# Patient Record
Sex: Female | Born: 1935
Health system: Southern US, Community
[De-identification: ages and names within clinical notes are randomized; demographics above are authoritative.]

## PROBLEM LIST (undated history)

## (undated) DIAGNOSIS — R233 Spontaneous ecchymoses: Secondary | ICD-10-CM

## (undated) DIAGNOSIS — K5790 Diverticulosis of intestine, part unspecified, without perforation or abscess without bleeding: Secondary | ICD-10-CM

## (undated) DIAGNOSIS — Z9889 Other specified postprocedural states: Secondary | ICD-10-CM

## (undated) DIAGNOSIS — K219 Gastro-esophageal reflux disease without esophagitis: Secondary | ICD-10-CM

## (undated) DIAGNOSIS — R634 Abnormal weight loss: Secondary | ICD-10-CM

## (undated) DIAGNOSIS — I219 Acute myocardial infarction, unspecified: Secondary | ICD-10-CM

## (undated) DIAGNOSIS — H353190 Nonexudative age-related macular degeneration, unspecified eye, stage unspecified: Secondary | ICD-10-CM

## (undated) DIAGNOSIS — C3490 Malignant neoplasm of unspecified part of unspecified bronchus or lung: Secondary | ICD-10-CM

## (undated) DIAGNOSIS — N393 Stress incontinence (female) (male): Secondary | ICD-10-CM

## (undated) DIAGNOSIS — M87052 Idiopathic aseptic necrosis of left femur: Secondary | ICD-10-CM

## (undated) DIAGNOSIS — S61419A Laceration without foreign body of unspecified hand, initial encounter: Secondary | ICD-10-CM

## (undated) DIAGNOSIS — I2510 Atherosclerotic heart disease of native coronary artery without angina pectoris: Secondary | ICD-10-CM

## (undated) DIAGNOSIS — N200 Calculus of kidney: Secondary | ICD-10-CM

## (undated) DIAGNOSIS — I10 Essential (primary) hypertension: Secondary | ICD-10-CM

## (undated) DIAGNOSIS — Z923 Personal history of irradiation: Secondary | ICD-10-CM

## (undated) DIAGNOSIS — M1990 Unspecified osteoarthritis, unspecified site: Secondary | ICD-10-CM

## (undated) DIAGNOSIS — Q828 Other specified congenital malformations of skin: Secondary | ICD-10-CM

## (undated) DIAGNOSIS — J189 Pneumonia, unspecified organism: Secondary | ICD-10-CM

## (undated) DIAGNOSIS — Z8619 Personal history of other infectious and parasitic diseases: Secondary | ICD-10-CM

## (undated) DIAGNOSIS — R0602 Shortness of breath: Secondary | ICD-10-CM

## (undated) DIAGNOSIS — I639 Cerebral infarction, unspecified: Secondary | ICD-10-CM

## (undated) DIAGNOSIS — E039 Hypothyroidism, unspecified: Secondary | ICD-10-CM

## (undated) DIAGNOSIS — R42 Dizziness and giddiness: Secondary | ICD-10-CM

## (undated) DIAGNOSIS — R112 Nausea with vomiting, unspecified: Secondary | ICD-10-CM

## (undated) DIAGNOSIS — R918 Other nonspecific abnormal finding of lung field: Secondary | ICD-10-CM

## (undated) DIAGNOSIS — J449 Chronic obstructive pulmonary disease, unspecified: Secondary | ICD-10-CM

## (undated) DIAGNOSIS — Z9181 History of falling: Secondary | ICD-10-CM

## (undated) DIAGNOSIS — R0789 Other chest pain: Secondary | ICD-10-CM

## (undated) DIAGNOSIS — Z87442 Personal history of urinary calculi: Secondary | ICD-10-CM

## (undated) DIAGNOSIS — R238 Other skin changes: Secondary | ICD-10-CM

## (undated) DIAGNOSIS — I951 Orthostatic hypotension: Secondary | ICD-10-CM

## (undated) DIAGNOSIS — R51 Headache: Secondary | ICD-10-CM

## (undated) DIAGNOSIS — R350 Frequency of micturition: Secondary | ICD-10-CM

## (undated) DIAGNOSIS — K529 Noninfective gastroenteritis and colitis, unspecified: Secondary | ICD-10-CM

## (undated) DIAGNOSIS — F32A Depression, unspecified: Secondary | ICD-10-CM

## (undated) DIAGNOSIS — F329 Major depressive disorder, single episode, unspecified: Secondary | ICD-10-CM

## (undated) DIAGNOSIS — E7800 Pure hypercholesterolemia, unspecified: Secondary | ICD-10-CM

## (undated) HISTORY — PX: APPENDECTOMY: SHX54

## (undated) HISTORY — PX: ABDOMINAL HYSTERECTOMY: SHX81

## (undated) HISTORY — PX: KNEE ARTHROSCOPY: SUR90

## (undated) HISTORY — PX: EYE SURGERY: SHX253

## (undated) HISTORY — PX: ESOPHAGOGASTRODUODENOSCOPY: SHX1529

## (undated) HISTORY — PX: TOTAL KNEE ARTHROPLASTY: SHX125

## (undated) HISTORY — PX: COLONOSCOPY: SHX174

## (undated) HISTORY — PX: HIP ARTHROSCOPY: SUR88

## (undated) HISTORY — PX: JOINT REPLACEMENT: SHX530

---

## 1999-07-02 HISTORY — PX: BLADDER SURGERY: SHX569

## 2008-07-01 HISTORY — PX: CORONARY ANGIOPLASTY WITH STENT PLACEMENT: SHX49

## 2008-07-01 HISTORY — PX: OTHER SURGICAL HISTORY: SHX169

## 2009-07-01 HISTORY — PX: LITHOTRIPSY: SUR834

## 2010-07-01 HISTORY — PX: OTHER SURGICAL HISTORY: SHX169

## 2011-07-02 HISTORY — PX: OTHER SURGICAL HISTORY: SHX169

## 2011-07-16 ENCOUNTER — Emergency Department (HOSPITAL_BASED_OUTPATIENT_CLINIC_OR_DEPARTMENT_OTHER)
Admission: EM | Admit: 2011-07-16 | Discharge: 2011-07-16 | Disposition: A | Discharge status: Home / Self Care | Payer: Medicare Other | Attending: Emergency Medicine | Admitting: Emergency Medicine

## 2011-07-16 ENCOUNTER — Other Ambulatory Visit (HOSPITAL_BASED_OUTPATIENT_CLINIC_OR_DEPARTMENT_OTHER): Payer: Self-pay | Admitting: Orthopedic Surgery

## 2011-07-16 ENCOUNTER — Encounter (HOSPITAL_BASED_OUTPATIENT_CLINIC_OR_DEPARTMENT_OTHER): Payer: Self-pay | Admitting: Emergency Medicine

## 2011-07-16 ENCOUNTER — Emergency Department (INDEPENDENT_AMBULATORY_CARE_PROVIDER_SITE_OTHER): Payer: Medicare Other

## 2011-07-16 DIAGNOSIS — I252 Old myocardial infarction: Secondary | ICD-10-CM | POA: Insufficient documentation

## 2011-07-16 DIAGNOSIS — Z79899 Other long term (current) drug therapy: Secondary | ICD-10-CM | POA: Insufficient documentation

## 2011-07-16 DIAGNOSIS — M25569 Pain in unspecified knee: Secondary | ICD-10-CM | POA: Insufficient documentation

## 2011-07-16 DIAGNOSIS — S8990XA Unspecified injury of unspecified lower leg, initial encounter: Secondary | ICD-10-CM

## 2011-07-16 DIAGNOSIS — E7800 Pure hypercholesterolemia, unspecified: Secondary | ICD-10-CM | POA: Insufficient documentation

## 2011-07-16 DIAGNOSIS — R609 Edema, unspecified: Secondary | ICD-10-CM

## 2011-07-16 DIAGNOSIS — M899 Disorder of bone, unspecified: Secondary | ICD-10-CM

## 2011-07-16 DIAGNOSIS — X58XXXA Exposure to other specified factors, initial encounter: Secondary | ICD-10-CM

## 2011-07-16 DIAGNOSIS — M25469 Effusion, unspecified knee: Secondary | ICD-10-CM

## 2011-07-16 DIAGNOSIS — I10 Essential (primary) hypertension: Secondary | ICD-10-CM | POA: Insufficient documentation

## 2011-07-16 DIAGNOSIS — I2510 Atherosclerotic heart disease of native coronary artery without angina pectoris: Secondary | ICD-10-CM | POA: Insufficient documentation

## 2011-07-16 DIAGNOSIS — M25561 Pain in right knee: Secondary | ICD-10-CM

## 2011-07-16 HISTORY — DX: Calculus of kidney: N20.0

## 2011-07-16 HISTORY — DX: Acute myocardial infarction, unspecified: I21.9

## 2011-07-16 HISTORY — DX: Noninfective gastroenteritis and colitis, unspecified: K52.9

## 2011-07-16 HISTORY — DX: Atherosclerotic heart disease of native coronary artery without angina pectoris: I25.10

## 2011-07-16 HISTORY — DX: Essential (primary) hypertension: I10

## 2011-07-16 HISTORY — DX: Pure hypercholesterolemia, unspecified: E78.00

## 2011-07-16 MED ORDER — DIAZEPAM 5 MG PO TABS: 5.0000 mg | ORAL_TABLET | Freq: Two times a day (BID) | ORAL | Status: AC

## 2011-07-16 MED ORDER — DIAZEPAM 5 MG PO TABS
5.0000 mg | ORAL_TABLET | Freq: Once | ORAL | Status: AC
  Administered 2011-07-16: 5 mg via ORAL
  Filled 2011-07-16: qty 1

## 2011-07-16 MED ORDER — IBUPROFEN 400 MG PO TABS
400.0000 mg | ORAL_TABLET | Freq: Once | ORAL | Status: AC
  Administered 2011-07-16: 400 mg via ORAL
  Filled 2011-07-16: qty 1

## 2011-07-16 NOTE — ED Provider Notes (Signed)
History     CSN: [id number]  Arrival date & time 07/16/11  0865   First MD Initiated Contact with Patient 07/16/11 [phone]      Chief Complaint  Patient presents with  . Knee Pain    (Consider location/radiation/quality/duration/timing/severity/associated sxs/prior treatment) HPI Comments: Patient presents with recurrent right knee pain.  Patient notes that for a period of months to years she has intermittently felt that right knee goes out.  This morning she got up and had problems with cramping in her left leg and then felt that her right knee went out on her when she tried to balance on that leg..  She did not fall.  She did not hit her knee.  Since that time she's had increased right knee pain.  No redness, fevers or swelling.  No ankle pain or hip pain.  She has been able to bear weight but is concerned about the knee.  She has not taken anything for the pain yet today.  The pain is worse with certain movements or trying to stand or walk.  sHe is able to ambulate.  Patient is a 78 y.o. female presenting with knee pain. The history is provided by the patient. No language interpreter was used.  Knee Pain This is a recurrent problem. The current episode started 6 to 12 hours ago. The problem has not changed since onset.Pertinent negatives include no chest pain, no abdominal pain, no headaches and no shortness of breath. The symptoms are aggravated by standing. The symptoms are relieved by nothing.    Past Medical History  Diagnosis Date  . Hypertension   . Colitis   . Coronary artery disease   . High cholesterol   . MI (myocardial infarction)   . Kidney stones     Past Surgical History  Procedure Date  . Coronary angioplasty with stent placement   . Hip arthroscopy     RT  . Knee arthroscopy     LT    History reviewed. No pertinent family history.  History  Substance Use Topics  . Smoking status: Not on file  . Smokeless tobacco: Not on file  . Alcohol Use:     OB  History    Grav Para Term Preterm Abortions TAB SAB Ect Mult Living                  Review of Systems  Constitutional: Negative.  Negative for fever and chills.  HENT: Negative.   Eyes: Negative.  Negative for discharge and redness.  Respiratory: Negative.  Negative for cough and shortness of breath.   Cardiovascular: Negative.  Negative for chest pain.  Gastrointestinal: Negative.  Negative for nausea, vomiting, abdominal pain and diarrhea.  Genitourinary: Negative.  Negative for dysuria and vaginal discharge.  Musculoskeletal: Positive for arthralgias. Negative for back pain.  Skin: Negative.  Negative for color change and rash.  Neurological: Negative.  Negative for syncope and headaches.  Hematological: Negative.  Negative for adenopathy.  Psychiatric/Behavioral: Negative.  Negative for confusion.  All other systems reviewed and are negative.    Allergies  Codeine  Home Medications   Current Outpatient Rx  Name Route Sig Dispense Refill  . AMITRIPTYLINE HCL 25 MG PO TABS Oral Take 25 mg by mouth at bedtime.    Marland Kitchen AMLODIPINE BESYLATE 5 MG PO TABS Oral Take 5 mg by mouth daily.    Marland Kitchen AMLODIPINE BESY-BENAZEPRIL HCL 2.5-10 MG PO CAPS Oral Take 1 capsule by mouth daily.    Marland Kitchen  ASPIRIN 325 MG PO TBEC Oral Take 325 mg by mouth daily.    Marland Kitchen CARVEDILOL 6.25 MG PO TABS Oral Take 6.25 mg by mouth 2 (two) times daily with a meal.    . VITAMIN B-12 IJ Injection Inject 1,000 mcg/mL as directed every 30 (thirty) days.    . CYCLOSPORINE 0.05 % OP EMUL Both Eyes Place 1 drop into both eyes daily.    . DONEPEZIL HCL 10 MG PO TABS Oral Take 10 mg by mouth at bedtime.    . OMEGA-3 FATTY ACIDS 1000 MG PO CAPS Oral Take 2 g by mouth daily.    Marland Kitchen FLUTICASONE-SALMETEROL 250-50 MCG/DOSE IN AEPB Inhalation Inhale 1 puff into the lungs daily.    Marland Kitchen LEVOTHYROXINE SODIUM 112 MCG PO TABS Oral Take 112 mcg by mouth daily.    Marland Kitchen MESALAMINE 1.2 G PO TBEC Oral Take 400 mg by mouth daily with breakfast.    .  NITROGLYCERIN 0.4 MG SL SUBL Sublingual Place 0.4 mg under the tongue every 5 (five) minutes as needed. For chest pain    . PANTOPRAZOLE SODIUM 40 MG PO TBEC Oral Take 40 mg by mouth daily.    Marland Kitchen RAMIPRIL 10 MG PO CAPS Oral Take 10 mg by mouth daily.    . RED YEAST RICE 600 MG PO CAPS Oral Take 2 capsules by mouth at bedtime.    Marland Kitchen POLYMYXIN B-TRIMETHOPRIM 10000-0.1 UNIT/ML-% OP SOLN Left Eye Place 1 drop into the left eye every 6 (six) hours.    Marland Kitchen VITAMIN D (ERGOCALCIFEROL) 50000 UNITS PO CAPS Oral Take 50,000 Units by mouth every 7 (seven) days.      BP 151/59  Pulse 66  Temp(Src) 97.5 F (36.4 C) (Oral)  Resp 16  Ht 5\' 11"  (1.803 m)  Wt 195 lb (88.451 kg)  BMI 27.20 kg/m2  SpO2 97%  Physical Exam  Constitutional: She is oriented to person, place, and time. She appears well-developed and well-nourished.  Non-toxic appearance. She does not have a sickly appearance.  HENT:  Head: Normocephalic and atraumatic.  Eyes: Conjunctivae, EOM and lids are normal. Pupils are equal, round, and reactive to light. No scleral icterus.  Neck: Trachea normal and normal range of motion. Neck supple.  Cardiovascular: Normal rate, regular rhythm and normal heart sounds.   Pulmonary/Chest: Effort normal and breath sounds normal.  Abdominal: Soft. Normal appearance. There is no tenderness. There is no rebound, no guarding and no CVA tenderness.  Musculoskeletal: Normal range of motion.       There is no swelling, redness or warmth to the right knee.  No focal areas of tenderness.  Patient is able to flex and extend that knee.  Palpable DP pulse.  Capillary refill less than 2 seconds.  No pain over her hip or ankle.  Neurological: She is alert and oriented to person, place, and time. She has normal strength.  Skin: Skin is warm, dry and intact. No rash noted.  Psychiatric: She has a normal mood and affect. Her behavior is normal. Judgment and thought content normal.    ED Course  Procedures (including  critical care time)  Labs Reviewed - No data to display Dg Knee Complete 4 Views Right  07/16/2011  *RADIOLOGY REPORT*  Clinical Data: Injury  RIGHT KNEE - COMPLETE 4+ VIEW  Comparison: None.  Findings: Moderate osteopenia.  Moderate tricompartment osteoarthritic change.  No acute fracture.  No dislocation.  Small joint effusion.  Soft tissue swelling over the patella is noted.  IMPRESSION:  Chronic changes.  No acute bony injury.  Small joint effusion associated with soft tissue swelling about the patella.  Original Report Authenticated By: Donavan Burnet, M.D.        MDM  Patient with arthritis in the knee joint and possible ligamentous or meniscal injury but no other acute abnormalities.  Patient does not have signs consistent with septic arthritis.  Patient is able to ambulate.  I have advised followup with an orthopedic doctor or with a sports medicine specialist I will give her referrals to both.  I will also send her home with some value if she's having some intermittent muscle spasms as well.  Patient is comfortable with this plan at this time the        Nat Christen, MD 07/16/11 1044

## 2011-07-16 NOTE — ED Notes (Signed)
Pt c/o RT knee pain; feels like it "went out" this am when she was having a cramp in her leg

## 2011-07-20 ENCOUNTER — Ambulatory Visit (HOSPITAL_BASED_OUTPATIENT_CLINIC_OR_DEPARTMENT_OTHER)
Admission: RE | Admit: 2011-07-20 | Discharge: 2011-07-20 | Disposition: A | Discharge status: Home / Self Care | Payer: Medicare Other | Source: Ambulatory Visit | Attending: Orthopedic Surgery | Admitting: Orthopedic Surgery

## 2011-07-20 DIAGNOSIS — M25569 Pain in unspecified knee: Secondary | ICD-10-CM

## 2011-07-20 DIAGNOSIS — R609 Edema, unspecified: Secondary | ICD-10-CM

## 2011-07-20 DIAGNOSIS — IMO0002 Reserved for concepts with insufficient information to code with codable children: Secondary | ICD-10-CM | POA: Insufficient documentation

## 2011-07-20 DIAGNOSIS — M7989 Other specified soft tissue disorders: Secondary | ICD-10-CM

## 2011-07-20 DIAGNOSIS — M25469 Effusion, unspecified knee: Secondary | ICD-10-CM | POA: Insufficient documentation

## 2011-07-20 DIAGNOSIS — M1710 Unilateral primary osteoarthritis, unspecified knee: Secondary | ICD-10-CM | POA: Insufficient documentation

## 2011-08-27 ENCOUNTER — Encounter (HOSPITAL_COMMUNITY): Payer: Self-pay | Admitting: Pharmacy Technician

## 2011-08-28 ENCOUNTER — Other Ambulatory Visit: Payer: Self-pay | Admitting: Orthopedic Surgery

## 2011-08-28 MED ORDER — ACETAMINOPHEN 10 MG/ML IV SOLN: 1000.0000 mg | Freq: Four times a day (QID) | INTRAVENOUS | Status: AC

## 2011-08-29 ENCOUNTER — Other Ambulatory Visit: Payer: Self-pay

## 2011-08-29 ENCOUNTER — Encounter (HOSPITAL_COMMUNITY): Payer: Self-pay

## 2011-08-29 ENCOUNTER — Encounter (HOSPITAL_COMMUNITY)
Admission: RE | Admit: 2011-08-29 | Discharge: 2011-08-29 | Disposition: A | Discharge status: Home / Self Care | Payer: Medicare Other | Source: Ambulatory Visit | Attending: Orthopedic Surgery | Admitting: Orthopedic Surgery

## 2011-08-29 HISTORY — DX: Shortness of breath: R06.02

## 2011-08-29 HISTORY — DX: Depression, unspecified: F32.A

## 2011-08-29 HISTORY — DX: Major depressive disorder, single episode, unspecified: F32.9

## 2011-08-29 HISTORY — DX: Stress incontinence (female) (male): N39.3

## 2011-08-29 HISTORY — DX: Other specified postprocedural states: Z98.890

## 2011-08-29 HISTORY — DX: Hypothyroidism, unspecified: E03.9

## 2011-08-29 HISTORY — DX: Diverticulosis of intestine, part unspecified, without perforation or abscess without bleeding: K57.90

## 2011-08-29 HISTORY — DX: Pneumonia, unspecified organism: J18.9

## 2011-08-29 HISTORY — DX: Chronic obstructive pulmonary disease, unspecified: J44.9

## 2011-08-29 HISTORY — DX: Frequency of micturition: R35.0

## 2011-08-29 HISTORY — DX: Gastro-esophageal reflux disease without esophagitis: K21.9

## 2011-08-29 HISTORY — DX: Spontaneous ecchymoses: R23.3

## 2011-08-29 HISTORY — DX: Dizziness and giddiness: R42

## 2011-08-29 HISTORY — DX: Headache: R51

## 2011-08-29 HISTORY — DX: Personal history of urinary calculi: Z87.442

## 2011-08-29 HISTORY — DX: Other specified postprocedural states: R11.2

## 2011-08-29 HISTORY — DX: Other specified congenital malformations of skin: Q82.8

## 2011-08-29 HISTORY — DX: Unspecified osteoarthritis, unspecified site: M19.90

## 2011-08-29 HISTORY — DX: Personal history of other infectious and parasitic diseases: Z86.19

## 2011-08-29 HISTORY — DX: Other skin changes: R23.8

## 2011-08-29 LAB — URINALYSIS, ROUTINE W REFLEX MICROSCOPIC
Bilirubin Urine: NEGATIVE
Glucose, UA: NEGATIVE mg/dL
Hgb urine dipstick: NEGATIVE
Ketones, ur: NEGATIVE mg/dL
Protein, ur: NEGATIVE mg/dL
pH: 5.5 (ref 5.0–8.0)

## 2011-08-29 LAB — CBC
HCT: 43.1 % (ref 36.0–46.0)
Hemoglobin: 14.5 g/dL (ref 12.0–15.0)
MCV: 88.9 fL (ref 78.0–100.0)
RDW: 14 % (ref 11.5–15.5)
WBC: 9.4 10*3/uL (ref 4.0–10.5)

## 2011-08-29 LAB — DIFFERENTIAL
Basophils Absolute: 0.1 10*3/uL (ref 0.0–0.1)
Basophils Relative: 1 % (ref 0–1)
Eosinophils Absolute: 0.6 10*3/uL (ref 0.0–0.7)
Monocytes Relative: 9 % (ref 3–12)
Neutro Abs: 4.1 10*3/uL (ref 1.7–7.7)
Neutrophils Relative %: 44 % (ref 43–77)

## 2011-08-29 LAB — COMPREHENSIVE METABOLIC PANEL
Albumin: 3.8 g/dL (ref 3.5–5.2)
BUN: 16 mg/dL (ref 6–23)
Chloride: 106 mEq/L (ref 96–112)
Creatinine, Ser: 0.99 mg/dL (ref 0.50–1.10)
GFR calc Af Amer: 62 mL/min — ABNORMAL LOW (ref >90)
GFR calc non Af Amer: 53 mL/min — ABNORMAL LOW (ref >90)
Glucose, Bld: 88 mg/dL (ref 70–99)
Total Bilirubin: 0.3 mg/dL (ref 0.3–1.2)

## 2011-08-29 LAB — TYPE AND SCREEN: ABO/RH(D): O POS

## 2011-08-29 LAB — PROTIME-INR
INR: 0.95 (ref 0.00–1.49)
Prothrombin Time: 12.9 seconds (ref 11.6–15.2)

## 2011-08-29 LAB — URINE MICROSCOPIC-ADD ON

## 2011-08-29 LAB — SURGICAL PCR SCREEN: MRSA, PCR: NEGATIVE

## 2011-08-29 MED ORDER — CHLORHEXIDINE GLUCONATE 4 % EX LIQD: 60.0000 mL | Freq: Once | CUTANEOUS | Status: DC

## 2011-08-29 NOTE — Progress Notes (Signed)
Dr.Chu in [place] with [facility]-last office visit about 2-3wks ago-to request  Stress test done about 6months ago-to request record Echo to be requested from Dr.Chu Heart cath in 2010-to request record

## 2011-08-29 NOTE — Pre-Procedure Instructions (Signed)
20 Jodi Frazier  08/29/2011   Your procedure is scheduled on:  Mon, Mar 11 @ 1145  Report to [facility] at 0945 AM.  Call this number if you have problems the morning of surgery: [phone]   Remember:   Do not eat food:After Midnight.  May have clear liquids: up to 4 Hours before arrival.(until 5:45 am)  Clear liquids include soda, tea, black coffee, apple or grape juice, broth.  Take these medicines the morning of surgery with A SIP OF WATER: Amlodipine,Carvedilol,Protonix,Synthroid,and Advair   Do not wear jewelry, make-up or nail polish.  Do not wear lotions, powders, or perfumes. You may wear deodorant.  Do not shave 48 hours prior to surgery.  Do not bring valuables to the hospital.  Contacts, dentures or bridgework may not be worn into surgery.  Leave suitcase in the car. After surgery it may be brought to your room.  For patients admitted to the hospital, checkout time is 11:00 AM the day of discharge.   Patients discharged the day of surgery will not be allowed to drive home.  Name and phone number of your driver:   Special Instructions: CHG Shower Use Special Wash: 1/2 bottle night before surgery and 1/2 bottle morning of surgery.   Please read over the following fact sheets that you were given: Pain Booklet, Coughing and Deep Breathing, Blood Transfusion Information, Total Joint Packet, MRSA Information and Surgical Site Infection Prevention

## 2011-08-29 NOTE — Progress Notes (Signed)
Pts mom had a hard time waking up after anesthesia

## 2011-08-30 LAB — ABO/RH: ABO/RH(D): O POS

## 2011-08-31 LAB — URINE CULTURE
Colony Count: >=100000
Culture  Setup Time: 201302281423

## 2011-09-02 NOTE — Consult Note (Signed)
Anesthesia:  Patient is a 78 year old female scheduled for a right TKA on 09/09/11.  History includes CAD s/p MI/RCA stent in 07/2007, COPD, former smoker, HTN, kidney stones, vertigo, colitis, cataracts, depression, hypercholesterolemia, headaches, GERD, hypothyroidism, macular degeneration, arthritis, and stress incontinence.  According to her Cardiologist (Dr. Theodoro Grist) she also has mild Alzheimer's dementia.    She was seen by Dr. Rhona Leavens for pre-operative clearance on 08/01/11 and felt to be a minimal risk.  She had a normal Lexiscan stress test showing no ischemia and normal LV function with EF 86% on 01/23/11.  She also had an unremarkable Holter study in May 2012.  EKG on 08/29/11 showed SB at 55 bpm.  An echo from 11/16/10 showed normal LV systolic function with mild LVH, EF 60%, mildly impaired diastolic dysfunction, mild mitral annular calcification with good mobility, mild MR, mild aortic sclerosis without stenosis and trace AR.    CXR report from 08/29/11 showed: 1. Biapical pleural parenchymal scarring, with asymmetry and slight nodularity on the right.  2. COPD with suspected right middle lobe, right lower lobe and lingular scarring.  3. Comparison with prior exams would be helpful, if available. If prior exams are not available, CT chest without contrast could be considered.  4. These results will be called to the ordering clinician or  representative by the Radiologist Assistant, and communication documented in the PACS Dashboard.   Labs acceptable.  As above, the Radiology staff is notifying Dr. Sherlean Foot of Ms. Coello's pre-operative CXR results.  Will defer timing of further evaluation to Dr. Sherlean Foot.  If no acute or worrisome cardiopulmonary findings noted on the day of surgery, then anticipate she can proceed from an Anesthesia standpoint.

## 2011-09-08 ENCOUNTER — Other Ambulatory Visit: Payer: Self-pay | Admitting: Orthopedic Surgery

## 2011-09-08 MED ORDER — CEFAZOLIN SODIUM-DEXTROSE 2-3 GM-% IV SOLR
2.0000 g | INTRAVENOUS | Status: AC
  Administered 2011-09-09: 2 g via INTRAVENOUS
  Filled 2011-09-08: qty 50

## 2011-09-08 NOTE — H&P (Signed)
Jodi Frazier MRN:  [id number] DOB/SEX:  19-Oct-1930/female  CHIEF COMPLAINT:  Painful right Knee  HISTORY: Patient is a 78 y.o. female presented with a history of pain in the right knee. Onset of symptoms was gradual starting several years ago with gradually worsening course since that time. The patient noted no past surgery on the right knee. Prior procedures on the knee include arthroscopy. Patient has been treated conservatively with over-the-counter NSAIDs and activity modification. Patient currently rates pain in the knee at 9 out of 10 with activity. There is pain at night.  PAST MEDICAL HISTORY: There are no active problems to display for this patient.  Past Medical History  Diagnosis Date  . Hypertension   . Colitis   . Coronary artery disease   . High cholesterol     takes Red Yeast Rice and Fish OIl daily  . Kidney stones   . PONV (postoperative nausea and vomiting)   . MI (myocardial infarction) 2010  . Shortness of breath     with exertion  . Pneumonia     hx of 20+yrs ago  . COPD (chronic obstructive pulmonary disease)     pleurisy or COPD exascerbation > 64yr ago  . Headache   . Vertigo     HTN related bc gets up too fast  . Arthritis   . Bruises easily   . History of shingles 59yrs ago    in eye;occasionally gets some residual   . Accessory skin tags     arms/legs  . GERD (gastroesophageal reflux disease)   . Colitis   . Diverticulosis   . Urinary frequency   . Stress incontinence   . History of kidney stones   . Hypothyroidism     takes sYnthroid daily  . Cataract     early stage on right  . Macular degeneration, wet   . Depression     some but takes Amitryptylline nightly   Past Surgical History  Procedure Date  . Hip arthroscopy 14yr ago    right hip-replacement  . Knee arthroscopy     LT  . Total knee arthroplasty 72yrs ago    left  . Appendectomy 28yrs ago  . Abdominal hysterectomy 13yrs ago   . Lithotripsy 2011  . Cataract surgery  2012    left  . Eye surgery 51yrs ago  . Bladder surgery 2001    tacked  . Coronary angioplasty with stent placement 2010    2 in rt coronary artery and 1 in another spot  . Colonoscopy   . Esophagogastroduodenoscopy      MEDICATIONS:   (Not in a hospital admission)  ALLERGIES:   Allergies  Allergen Reactions  . Codeine Hives    REVIEW OF SYSTEMS:  Pertinent items are noted in HPI.   FAMILY HISTORY:   Family History  Problem Relation Age of Onset  . Anesthesia problems Mother   . Hypotension Neg Hx   . Malignant hyperthermia Neg Hx   . Pseudochol deficiency Neg Hx     SOCIAL HISTORY:   History  Substance Use Topics  . Smoking status: Former Games developer  . Smokeless tobacco: Not on file   Comment: quit several yrs ago  . Alcohol Use: Yes     2 times week alcohol     EXAMINATION:  Vital signs in last 24 hours: @VSRANGES @  General appearance: alert, cooperative and no distress Lungs: clear to auscultation bilaterally Heart: regular rate and rhythm, S1, S2 normal, no murmur, click, rub  or gallop Abdomen: soft, non-tender; bowel sounds normal; no masses,  no organomegaly Extremities: extremities normal, atraumatic, no cyanosis or edema and Homans sign is negative, no sign of DVT Pulses: 2+ and symmetric Skin: Skin color, texture, turgor normal. No rashes or lesions Neurologic: Alert and oriented X 3, normal strength and tone. Normal symmetric reflexes. Normal coordination and gait  Musculoskeletal:  ROM 0-110, Ligaments intact,  Imaging Review Plain radiographs demonstrate severe degenerative joint disease of the right knee. The overall alignment is mild varus. The bone quality appears to be good for age and reported activity level.  Assessment/Plan: End stage arthritis, right knee   The patient history, physical examination and imaging studies are consistent with advanced degenerative joint disease of the right knee. The patient has failed conservative  treatment.  The clearance notes were reviewed.  After discussion with the patient it was felt that Total Knee Replacement was indicated. The procedure,  risks, and benefits of total knee arthroplasty were presented and reviewed. The risks including but not limited to aseptic loosening, infection, blood clots, vascular injury, stiffness, patella tracking problems complications among others were discussed. The patient acknowledged the explanation, agreed to proceed with the plan.  Jodi Frazier 09/08/2011, 3:58 PM

## 2011-09-09 ENCOUNTER — Encounter (HOSPITAL_COMMUNITY): Payer: Self-pay | Admitting: Vascular Surgery

## 2011-09-09 ENCOUNTER — Ambulatory Visit (HOSPITAL_COMMUNITY): Payer: Medicare Other | Admitting: Vascular Surgery

## 2011-09-09 ENCOUNTER — Inpatient Hospital Stay (HOSPITAL_COMMUNITY)
Admission: RE | Admit: 2011-09-09 | Discharge: 2011-09-11 | DRG: 470 | Disposition: A | Discharge status: Home Health | Payer: Medicare Other | Source: Ambulatory Visit | Attending: Orthopedic Surgery | Admitting: Orthopedic Surgery

## 2011-09-09 ENCOUNTER — Encounter (HOSPITAL_COMMUNITY)
Admission: RE | Disposition: A | Discharge status: Home Health | Payer: Self-pay | Source: Ambulatory Visit | Attending: Orthopedic Surgery

## 2011-09-09 DIAGNOSIS — Z886 Allergy status to analgesic agent status: Secondary | ICD-10-CM

## 2011-09-09 DIAGNOSIS — E039 Hypothyroidism, unspecified: Secondary | ICD-10-CM | POA: Diagnosis present

## 2011-09-09 DIAGNOSIS — J4489 Other specified chronic obstructive pulmonary disease: Secondary | ICD-10-CM | POA: Diagnosis present

## 2011-09-09 DIAGNOSIS — I2510 Atherosclerotic heart disease of native coronary artery without angina pectoris: Secondary | ICD-10-CM | POA: Diagnosis present

## 2011-09-09 DIAGNOSIS — Z87891 Personal history of nicotine dependence: Secondary | ICD-10-CM

## 2011-09-09 DIAGNOSIS — M1710 Unilateral primary osteoarthritis, unspecified knee: Principal | ICD-10-CM | POA: Diagnosis present

## 2011-09-09 DIAGNOSIS — D62 Acute posthemorrhagic anemia: Secondary | ICD-10-CM | POA: Diagnosis not present

## 2011-09-09 DIAGNOSIS — F3289 Other specified depressive episodes: Secondary | ICD-10-CM | POA: Diagnosis present

## 2011-09-09 DIAGNOSIS — M1711 Unilateral primary osteoarthritis, right knee: Secondary | ICD-10-CM

## 2011-09-09 DIAGNOSIS — I252 Old myocardial infarction: Secondary | ICD-10-CM

## 2011-09-09 DIAGNOSIS — J449 Chronic obstructive pulmonary disease, unspecified: Secondary | ICD-10-CM | POA: Diagnosis present

## 2011-09-09 DIAGNOSIS — F329 Major depressive disorder, single episode, unspecified: Secondary | ICD-10-CM | POA: Diagnosis present

## 2011-09-09 DIAGNOSIS — K219 Gastro-esophageal reflux disease without esophagitis: Secondary | ICD-10-CM | POA: Diagnosis present

## 2011-09-09 DIAGNOSIS — Z01812 Encounter for preprocedural laboratory examination: Secondary | ICD-10-CM

## 2011-09-09 HISTORY — PX: TOTAL KNEE ARTHROPLASTY: SHX125

## 2011-09-09 SURGERY — ARTHROPLASTY, KNEE, TOTAL
Anesthesia: General | Site: Knee | Laterality: Right | Wound class: Clean

## 2011-09-09 MED ORDER — BUPIVACAINE-EPINEPHRINE 0.25% -1:200000 IJ SOLN
INTRAMUSCULAR | Status: DC | PRN
  Administered 2011-09-09: 20 mL

## 2011-09-09 MED ORDER — ALUM & MAG HYDROXIDE-SIMETH 200-200-20 MG/5ML PO SUSP: 30.0000 mL | ORAL | Status: DC | PRN

## 2011-09-09 MED ORDER — CYCLOSPORINE 0.05 % OP EMUL
1.0000 [drp] | Freq: Every day | OPHTHALMIC | Status: DC
  Administered 2011-09-09 – 2011-09-11 (×3): 1 [drp] via OPHTHALMIC
  Filled 2011-09-09 (×3): qty 1

## 2011-09-09 MED ORDER — EPHEDRINE SULFATE 50 MG/ML IJ SOLN
INTRAMUSCULAR | Status: DC | PRN
  Administered 2011-09-09: 25 mg via INTRAVENOUS

## 2011-09-09 MED ORDER — HYDROMORPHONE HCL PF 1 MG/ML IJ SOLN
0.5000 mg | INTRAMUSCULAR | Status: DC | PRN
  Administered 2011-09-09 (×3): 1 mg via INTRAVENOUS
  Filled 2011-09-09 (×3): qty 1

## 2011-09-09 MED ORDER — SENNOSIDES-DOCUSATE SODIUM 8.6-50 MG PO TABS: 1.0000 | ORAL_TABLET | Freq: Every evening | ORAL | Status: DC | PRN

## 2011-09-09 MED ORDER — MENTHOL 3 MG MT LOZG: 1.0000 | LOZENGE | OROMUCOSAL | Status: DC | PRN

## 2011-09-09 MED ORDER — ONDANSETRON HCL 4 MG/2ML IJ SOLN
INTRAMUSCULAR | Status: DC | PRN
  Administered 2011-09-09: 4 mg via INTRAVENOUS

## 2011-09-09 MED ORDER — MESALAMINE 1.2 G PO TBEC
1200.0000 mg | DELAYED_RELEASE_TABLET | Freq: Every day | ORAL | Status: DC
  Administered 2011-09-10 – 2011-09-11 (×2): 1.2 g via ORAL
  Filled 2011-09-09 (×3): qty 1

## 2011-09-09 MED ORDER — FLEET ENEMA 7-19 GM/118ML RE ENEM: 1.0000 | ENEMA | Freq: Once | RECTAL | Status: AC | PRN

## 2011-09-09 MED ORDER — BENAZEPRIL HCL 10 MG PO TABS
10.0000 mg | ORAL_TABLET | Freq: Every day | ORAL | Status: DC
  Administered 2011-09-10 – 2011-09-11 (×2): 10 mg via ORAL
  Filled 2011-09-09 (×2): qty 1

## 2011-09-09 MED ORDER — SODIUM CHLORIDE 0.9 % IV SOLN: INTRAVENOUS | Status: DC

## 2011-09-09 MED ORDER — FENTANYL CITRATE 0.05 MG/ML IJ SOLN
INTRAMUSCULAR | Status: DC | PRN
  Administered 2011-09-09 (×2): 50 ug via INTRAVENOUS
  Administered 2011-09-09 (×4): 25 ug via INTRAVENOUS
  Administered 2011-09-09: 50 ug via INTRAVENOUS

## 2011-09-09 MED ORDER — METHOCARBAMOL 500 MG PO TABS
500.0000 mg | ORAL_TABLET | Freq: Four times a day (QID) | ORAL | Status: DC | PRN
  Administered 2011-09-10 – 2011-09-11 (×2): 500 mg via ORAL
  Filled 2011-09-09 (×3): qty 1

## 2011-09-09 MED ORDER — METHOCARBAMOL 100 MG/ML IJ SOLN
500.0000 mg | INTRAVENOUS | Status: AC
  Administered 2011-09-09: 500 mg via INTRAVENOUS
  Filled 2011-09-09: qty 5

## 2011-09-09 MED ORDER — ACETAMINOPHEN 325 MG PO TABS
650.0000 mg | ORAL_TABLET | Freq: Four times a day (QID) | ORAL | Status: DC | PRN
  Administered 2011-09-10: 650 mg via ORAL
  Filled 2011-09-09: qty 2

## 2011-09-09 MED ORDER — DEXAMETHASONE SODIUM PHOSPHATE 4 MG/ML IJ SOLN
INTRAMUSCULAR | Status: DC | PRN
  Administered 2011-09-09: 4 mg via INTRAVENOUS

## 2011-09-09 MED ORDER — ZOLPIDEM TARTRATE 5 MG PO TABS
5.0000 mg | ORAL_TABLET | Freq: Every evening | ORAL | Status: DC | PRN
  Administered 2011-09-10: 5 mg via ORAL
  Filled 2011-09-09: qty 1

## 2011-09-09 MED ORDER — PROPOFOL 10 MG/ML IV EMUL
INTRAVENOUS | Status: DC | PRN
  Administered 2011-09-09: 170 mg via INTRAVENOUS

## 2011-09-09 MED ORDER — ONDANSETRON HCL 4 MG/2ML IJ SOLN
4.0000 mg | Freq: Four times a day (QID) | INTRAMUSCULAR | Status: DC | PRN
  Filled 2011-09-09: qty 2

## 2011-09-09 MED ORDER — AMLODIPINE BESYLATE 5 MG PO TABS
5.0000 mg | ORAL_TABLET | Freq: Every day | ORAL | Status: DC
  Administered 2011-09-10 – 2011-09-11 (×2): 5 mg via ORAL
  Filled 2011-09-09 (×2): qty 1

## 2011-09-09 MED ORDER — BUPIVACAINE 0.25 % ON-Q PUMP SINGLE CATH 300ML
INJECTION | Status: DC | PRN
  Administered 2011-09-09: 300 mL

## 2011-09-09 MED ORDER — MIDAZOLAM HCL 5 MG/5ML IJ SOLN
INTRAMUSCULAR | Status: DC | PRN
  Administered 2011-09-09: 0.5 mg via INTRAVENOUS
  Administered 2011-09-09: 1 mg via INTRAVENOUS

## 2011-09-09 MED ORDER — POLYMYXIN B-TRIMETHOPRIM 10000-0.1 UNIT/ML-% OP SOLN
1.0000 [drp] | Freq: Four times a day (QID) | OPHTHALMIC | Status: DC | PRN
  Filled 2011-09-09: qty 10

## 2011-09-09 MED ORDER — ONDANSETRON HCL 4 MG PO TABS: 4.0000 mg | ORAL_TABLET | Freq: Four times a day (QID) | ORAL | Status: DC | PRN

## 2011-09-09 MED ORDER — LACTATED RINGERS IV SOLN
INTRAVENOUS | Status: DC | PRN
  Administered 2011-09-09 (×2): via INTRAVENOUS

## 2011-09-09 MED ORDER — MEPERIDINE HCL 25 MG/ML IJ SOLN: 6.2500 mg | INTRAMUSCULAR | Status: DC | PRN

## 2011-09-09 MED ORDER — PHENOL 1.4 % MT LIQD: 1.0000 | OROMUCOSAL | Status: DC | PRN

## 2011-09-09 MED ORDER — DEXTROSE 5 % IV SOLN
500.0000 mg | Freq: Four times a day (QID) | INTRAVENOUS | Status: DC | PRN
  Filled 2011-09-09: qty 5

## 2011-09-09 MED ORDER — METOCLOPRAMIDE HCL 10 MG PO TABS: 5.0000 mg | ORAL_TABLET | Freq: Three times a day (TID) | ORAL | Status: DC | PRN

## 2011-09-09 MED ORDER — PROMETHAZINE HCL 25 MG/ML IJ SOLN: 6.2500 mg | INTRAMUSCULAR | Status: DC | PRN

## 2011-09-09 MED ORDER — ACETAMINOPHEN 10 MG/ML IV SOLN
INTRAVENOUS | Status: AC
  Filled 2011-09-09: qty 100

## 2011-09-09 MED ORDER — LEVOTHYROXINE SODIUM 112 MCG PO TABS
112.0000 ug | ORAL_TABLET | Freq: Every day | ORAL | Status: DC
  Administered 2011-09-10 – 2011-09-11 (×2): 112 ug via ORAL
  Filled 2011-09-09 (×3): qty 1

## 2011-09-09 MED ORDER — CEFAZOLIN SODIUM-DEXTROSE 2-3 GM-% IV SOLR
2.0000 g | Freq: Four times a day (QID) | INTRAVENOUS | Status: AC
Start: 2011-09-09 — End: 2011-09-10
  Administered 2011-09-09 – 2011-09-10 (×2): 2 g via INTRAVENOUS
  Filled 2011-09-09 (×4): qty 50

## 2011-09-09 MED ORDER — ACETAMINOPHEN 10 MG/ML IV SOLN
INTRAVENOUS | Status: DC | PRN
  Administered 2011-09-09: 1000 mg via INTRAVENOUS

## 2011-09-09 MED ORDER — OXYCODONE HCL 10 MG PO TB12
10.0000 mg | ORAL_TABLET | Freq: Two times a day (BID) | ORAL | Status: DC
  Administered 2011-09-09 (×2): 10 mg via ORAL
  Filled 2011-09-09 (×2): qty 1

## 2011-09-09 MED ORDER — SODIUM CHLORIDE 0.9 % IV SOLN
INTRAVENOUS | Status: DC
  Administered 2011-09-09 – 2011-09-10 (×3): via INTRAVENOUS

## 2011-09-09 MED ORDER — METOCLOPRAMIDE HCL 5 MG/ML IJ SOLN: 5.0000 mg | Freq: Three times a day (TID) | INTRAMUSCULAR | Status: DC | PRN

## 2011-09-09 MED ORDER — CEFAZOLIN SODIUM 1-5 GM-% IV SOLN
INTRAVENOUS | Status: AC
  Filled 2011-09-09: qty 100

## 2011-09-09 MED ORDER — NITROGLYCERIN 0.4 MG SL SUBL: 0.4000 mg | SUBLINGUAL_TABLET | SUBLINGUAL | Status: DC | PRN

## 2011-09-09 MED ORDER — OXYCODONE HCL 5 MG PO TABS
5.0000 mg | ORAL_TABLET | ORAL | Status: DC | PRN
  Administered 2011-09-09 – 2011-09-11 (×5): 10 mg via ORAL
  Filled 2011-09-09 (×4): qty 2

## 2011-09-09 MED ORDER — LACTATED RINGERS IV SOLN
INTRAVENOUS | Status: DC
  Administered 2011-09-09: 08:00:00 via INTRAVENOUS

## 2011-09-09 MED ORDER — ACETAMINOPHEN 650 MG RE SUPP: 650.0000 mg | Freq: Four times a day (QID) | RECTAL | Status: DC | PRN

## 2011-09-09 MED ORDER — BUPIVACAINE 0.25 % ON-Q PUMP SINGLE CATH 300ML
300.0000 mL | INJECTION | Status: DC
  Filled 2011-09-09: qty 300

## 2011-09-09 MED ORDER — CARVEDILOL 3.125 MG PO TABS
3.1250 mg | ORAL_TABLET | Freq: Two times a day (BID) | ORAL | Status: DC
  Administered 2011-09-09 – 2011-09-11 (×4): 3.125 mg via ORAL
  Filled 2011-09-09 (×8): qty 1

## 2011-09-09 MED ORDER — HYDROMORPHONE HCL PF 1 MG/ML IJ SOLN
0.2500 mg | INTRAMUSCULAR | Status: DC | PRN
  Administered 2011-09-09 (×3): 0.5 mg via INTRAVENOUS

## 2011-09-09 MED ORDER — BUPIVACAINE ON-Q PAIN PUMP (FOR ORDER SET NO CHG)
INJECTION | Status: DC
  Filled 2011-09-09: qty 1

## 2011-09-09 MED ORDER — BISACODYL 5 MG PO TBEC: 5.0000 mg | DELAYED_RELEASE_TABLET | Freq: Every day | ORAL | Status: DC | PRN

## 2011-09-09 MED ORDER — PANTOPRAZOLE SODIUM 40 MG PO TBEC
40.0000 mg | DELAYED_RELEASE_TABLET | Freq: Every day | ORAL | Status: DC
  Administered 2011-09-10 – 2011-09-11 (×2): 40 mg via ORAL
  Filled 2011-09-09 (×2): qty 1

## 2011-09-09 MED ORDER — DOCUSATE SODIUM 100 MG PO CAPS
100.0000 mg | ORAL_CAPSULE | Freq: Two times a day (BID) | ORAL | Status: DC
  Administered 2011-09-09 – 2011-09-11 (×4): 100 mg via ORAL
  Filled 2011-09-09 (×6): qty 1

## 2011-09-09 MED ORDER — DONEPEZIL HCL 10 MG PO TABS
10.0000 mg | ORAL_TABLET | Freq: Every day | ORAL | Status: DC
  Administered 2011-09-09 – 2011-09-10 (×2): 10 mg via ORAL
  Filled 2011-09-09 (×3): qty 1

## 2011-09-09 MED ORDER — FLUTICASONE-SALMETEROL 250-50 MCG/DOSE IN AEPB
1.0000 | INHALATION_SPRAY | Freq: Every day | RESPIRATORY_TRACT | Status: DC
  Administered 2011-09-10 – 2011-09-11 (×2): 1 via RESPIRATORY_TRACT
  Filled 2011-09-09: qty 14

## 2011-09-09 MED ORDER — CELECOXIB 200 MG PO CAPS
200.0000 mg | ORAL_CAPSULE | Freq: Two times a day (BID) | ORAL | Status: DC
  Administered 2011-09-09 – 2011-09-11 (×5): 200 mg via ORAL
  Filled 2011-09-09 (×7): qty 1

## 2011-09-09 MED ORDER — SODIUM CHLORIDE 0.9 % IR SOLN
Status: DC | PRN
  Administered 2011-09-09: 4000 mL

## 2011-09-09 MED ORDER — DIPHENHYDRAMINE HCL 12.5 MG/5ML PO ELIX
12.5000 mg | ORAL_SOLUTION | ORAL | Status: DC | PRN
  Administered 2011-09-09: 25 mg via ORAL
  Filled 2011-09-09: qty 5

## 2011-09-09 MED ORDER — METOCLOPRAMIDE HCL 5 MG/ML IJ SOLN
INTRAMUSCULAR | Status: DC | PRN
  Administered 2011-09-09: 10 mg via INTRAVENOUS

## 2011-09-09 MED ORDER — AMLODIPINE BESY-BENAZEPRIL HCL 2.5-10 MG PO CAPS: 1.0000 | ORAL_CAPSULE | Freq: Every day | ORAL | Status: DC

## 2011-09-09 MED ORDER — HYDROMORPHONE HCL PF 1 MG/ML IJ SOLN
INTRAMUSCULAR | Status: AC
  Filled 2011-09-09: qty 1

## 2011-09-09 MED ORDER — AMLODIPINE BESYLATE 2.5 MG PO TABS: 2.5000 mg | ORAL_TABLET | Freq: Every day | ORAL | Status: DC

## 2011-09-09 MED ORDER — ENOXAPARIN SODIUM 40 MG/0.4ML ~~LOC~~ SOLN
40.0000 mg | SUBCUTANEOUS | Status: DC
  Administered 2011-09-10 – 2011-09-11 (×2): 40 mg via SUBCUTANEOUS
  Filled 2011-09-09 (×3): qty 0.4

## 2011-09-09 MED ORDER — RAMIPRIL 10 MG PO CAPS
10.0000 mg | ORAL_CAPSULE | Freq: Every day | ORAL | Status: DC
  Administered 2011-09-10 – 2011-09-11 (×2): 10 mg via ORAL
  Filled 2011-09-09 (×2): qty 1

## 2011-09-09 MED ORDER — AMITRIPTYLINE HCL 25 MG PO TABS
25.0000 mg | ORAL_TABLET | Freq: Every day | ORAL | Status: DC
  Administered 2011-09-09 – 2011-09-10 (×2): 25 mg via ORAL
  Filled 2011-09-09 (×3): qty 1

## 2011-09-09 SURGICAL SUPPLY — 61 items
BANDAGE ACE 4 STERILE (GAUZE/BANDAGES/DRESSINGS) ×2 IMPLANT
BANDAGE ELASTIC 6 VELCRO ST LF (GAUZE/BANDAGES/DRESSINGS) ×2 IMPLANT
BANDAGE ESMARK 6X9 LF (GAUZE/BANDAGES/DRESSINGS) ×1 IMPLANT
BLADE SAGITTAL 13X1.27X60 (BLADE) ×2 IMPLANT
BLADE SAW SGTL 83.5X18.5 (BLADE) ×2 IMPLANT
BNDG ESMARK 6X9 LF (GAUZE/BANDAGES/DRESSINGS) ×2
BOWL SMART MIX CTS (DISPOSABLE) ×2 IMPLANT
CATH KIT ON Q 5IN SLV (PAIN MANAGEMENT) ×2 IMPLANT
CEMENT BONE SIMPLEX SPEEDSET (Cement) ×4 IMPLANT
CLOTH BEACON ORANGE TIMEOUT ST (SAFETY) ×2 IMPLANT
COVER BACK TABLE 24X17X13 BIG (DRAPES) ×2 IMPLANT
COVER SURGICAL LIGHT HANDLE (MISCELLANEOUS) ×2 IMPLANT
CUFF TOURNIQUET SINGLE 34IN LL (TOURNIQUET CUFF) ×2 IMPLANT
DRAPE EXTREMITY T 121X128X90 (DRAPE) ×2 IMPLANT
DRAPE INCISE IOBAN 66X45 STRL (DRAPES) ×4 IMPLANT
DRAPE PROXIMA HALF (DRAPES) ×2 IMPLANT
DRAPE U-SHAPE 47X51 STRL (DRAPES) ×2 IMPLANT
DRSG ADAPTIC 3X8 NADH LF (GAUZE/BANDAGES/DRESSINGS) IMPLANT
DRSG PAD ABDOMINAL 8X10 ST (GAUZE/BANDAGES/DRESSINGS) IMPLANT
DURAPREP 26ML APPLICATOR (WOUND CARE) ×2 IMPLANT
ELECT REM PT RETURN 9FT ADLT (ELECTROSURGICAL) ×2
ELECTRODE REM PT RTRN 9FT ADLT (ELECTROSURGICAL) ×1 IMPLANT
EVACUATOR 1/8 PVC DRAIN (DRAIN) ×2 IMPLANT
GLOVE BIOGEL M 7.0 STRL (GLOVE) ×2 IMPLANT
GLOVE BIOGEL PI IND STRL 7.5 (GLOVE) ×1 IMPLANT
GLOVE BIOGEL PI IND STRL 8 (GLOVE) ×1 IMPLANT
GLOVE BIOGEL PI IND STRL 8.5 (GLOVE) ×1 IMPLANT
GLOVE BIOGEL PI INDICATOR 7.5 (GLOVE) ×1
GLOVE BIOGEL PI INDICATOR 8 (GLOVE) ×1
GLOVE BIOGEL PI INDICATOR 8.5 (GLOVE) ×1
GLOVE SURG ORTHO 8.0 STRL STRW (GLOVE) ×2 IMPLANT
GLOVE SURG SS PI 7.5 STRL IVOR (GLOVE) ×2 IMPLANT
GOWN PREVENTION PLUS XLARGE (GOWN DISPOSABLE) ×4 IMPLANT
GOWN STRL NON-REIN LRG LVL3 (GOWN DISPOSABLE) IMPLANT
GOWN STRL REIN XL XLG (GOWN DISPOSABLE) ×2 IMPLANT
HANDPIECE INTERPULSE COAX TIP (DISPOSABLE) ×1
HOOD PEEL AWAY FACE SHEILD DIS (HOOD) ×6 IMPLANT
KIT BASIN OR (CUSTOM PROCEDURE TRAY) ×2 IMPLANT
KIT ROOM TURNOVER OR (KITS) ×2 IMPLANT
MANIFOLD NEPTUNE II (INSTRUMENTS) ×2 IMPLANT
NEEDLE 22X1 1/2 (OR ONLY) (NEEDLE) ×2 IMPLANT
NS IRRIG 1000ML POUR BTL (IV SOLUTION) ×2 IMPLANT
PACK TOTAL JOINT (CUSTOM PROCEDURE TRAY) ×2 IMPLANT
PAD ARMBOARD 7.5X6 YLW CONV (MISCELLANEOUS) ×4 IMPLANT
PADDING CAST COTTON 6X4 STRL (CAST SUPPLIES) ×2 IMPLANT
POSITIONER HEAD PRONE TRACH (MISCELLANEOUS) ×2 IMPLANT
SET HNDPC FAN SPRY TIP SCT (DISPOSABLE) ×1 IMPLANT
SPONGE GAUZE 4X4 12PLY (GAUZE/BANDAGES/DRESSINGS) IMPLANT
STAPLER VISISTAT 35W (STAPLE) ×2 IMPLANT
SUCTION FRAZIER TIP 10 FR DISP (SUCTIONS) ×2 IMPLANT
SUT BONE WAX W31G (SUTURE) ×2 IMPLANT
SUT VIC AB 0 CTB1 27 (SUTURE) ×4 IMPLANT
SUT VIC AB 1 CT1 27 (SUTURE) ×3
SUT VIC AB 1 CT1 27XBRD ANBCTR (SUTURE) ×3 IMPLANT
SUT VIC AB 2-0 CT1 27 (SUTURE) ×2
SUT VIC AB 2-0 CT1 TAPERPNT 27 (SUTURE) ×2 IMPLANT
SYR CONTROL 10ML LL (SYRINGE) ×2 IMPLANT
TOWEL OR 17X24 6PK STRL BLUE (TOWEL DISPOSABLE) ×2 IMPLANT
TOWEL OR 17X26 10 PK STRL BLUE (TOWEL DISPOSABLE) ×2 IMPLANT
TRAY FOLEY CATH 14FR (SET/KITS/TRAYS/PACK) ×2 IMPLANT
WATER STERILE IRR 1000ML POUR (IV SOLUTION) ×4 IMPLANT

## 2011-09-09 NOTE — Anesthesia Postprocedure Evaluation (Signed)
  Anesthesia Post-op Note  Patient: Jodi Frazier  Procedure(s) Performed: Procedure(s) (LRB): TOTAL KNEE ARTHROPLASTY (Right)  Patient Location: PACU  Anesthesia Type: GA combined with regional for post-op pain  Level of Consciousness: awake  Airway and Oxygen Therapy: Patient Spontanous Breathing  Post-op Pain: mild  Post-op Assessment: Post-op Vital signs reviewed  Post-op Vital Signs: stable  Complications: No apparent anesthesia complications

## 2011-09-09 NOTE — Progress Notes (Signed)
Orthopedic Tech Progress Note Patient Details:  Jodi Frazier [birth year] [id number]  CPM Right Knee CPM Right Knee: On Right Knee Flexion (Degrees): 90  Right Knee Extension (Degrees): 0    Jodi Frazier, Mickie Bail 09/09/2011, 12:32 PM

## 2011-09-09 NOTE — Plan of Care (Signed)
Problem: Consults Goal: Diagnosis- Total Joint Replacement Primary Total Knee     

## 2011-09-09 NOTE — Anesthesia Preprocedure Evaluation (Addendum)
Anesthesia Evaluation  Patient identified by MRN, date of birth, ID band Patient awake    History of Anesthesia Complications (+) PONV  Airway Mallampati: I  Neck ROM: Full    Dental  (+) Teeth Intact and Caps,    Pulmonary shortness of breath, pneumonia , COPD breath sounds clear to auscultation        Cardiovascular hypertension, Pt. on medications + CAD and + Past MI Rhythm:Regular Rate:Normal     Neuro/Psych  Headaches, PSYCHIATRIC DISORDERS Depression    GI/Hepatic GERD-  ,  Endo/Other  Hypothyroidism   Renal/GU      Musculoskeletal   Abdominal   Peds  Hematology   Anesthesia Other Findings   Reproductive/Obstetrics                         Anesthesia Physical Anesthesia Plan  ASA: III  Anesthesia Plan: General   Post-op Pain Management:    Induction: Intravenous  Airway Management Planned: LMA  Additional Equipment:   Intra-op Plan:   Post-operative Plan: Extubation in OR  Informed Consent: I have reviewed the patients History and Physical, chart, labs and discussed the procedure including the risks, benefits and alternatives for the proposed anesthesia with the patient or authorized representative who has indicated his/her understanding and acceptance.   Dental advisory given  Plan Discussed with: CRNA and Surgeon  Anesthesia Plan Comments:         Anesthesia Quick Evaluation

## 2011-09-09 NOTE — Preoperative (Signed)
Beta Blockers   Reason not to administer Beta Blockers:Not Applicable 

## 2011-09-09 NOTE — Progress Notes (Signed)
Orthopedic Tech Progress Note Patient Details:  [name] Feb 09, 1931 [id number]  Patient ID: Jodi Frazier, female   DOB: [birth year], 78 y.o.   MRN: [id number]   Shawnie Pons 09/09/2011, 12:32 PM Trapeze bar

## 2011-09-09 NOTE — H&P (View-Only) (Signed)
Jodi Frazier MRN:  [id number] DOB/SEX:  10/04/1930/female  CHIEF COMPLAINT:  Painful right Knee  HISTORY: Patient is a 78 y.o. female presented with a history of pain in the right knee. Onset of symptoms was gradual starting several years ago with gradually worsening course since that time. The patient noted no past surgery on the right knee. Prior procedures on the knee include arthroscopy. Patient has been treated conservatively with over-the-counter NSAIDs and activity modification. Patient currently rates pain in the knee at 9 out of 10 with activity. There is pain at night.  PAST MEDICAL HISTORY: There are no active problems to display for this patient.  Past Medical History  Diagnosis Date  . Hypertension   . Colitis   . Coronary artery disease   . High cholesterol     takes Red Yeast Rice and Fish OIl daily  . Kidney stones   . PONV (postoperative nausea and vomiting)   . MI (myocardial infarction) 2010  . Shortness of breath     with exertion  . Pneumonia     hx of 20+yrs ago  . COPD (chronic obstructive pulmonary disease)     pleurisy or COPD exascerbation > 1yr ago  . Headache   . Vertigo     HTN related bc gets up too fast  . Arthritis   . Bruises easily   . History of shingles 10yrs ago    in eye;occasionally gets some residual   . Accessory skin tags     arms/legs  . GERD (gastroesophageal reflux disease)   . Colitis   . Diverticulosis   . Urinary frequency   . Stress incontinence   . History of kidney stones   . Hypothyroidism     takes sYnthroid daily  . Cataract     early stage on right  . Macular degeneration, wet   . Depression     some but takes Amitryptylline nightly   Past Surgical History  Procedure Date  . Hip arthroscopy 17yr ago    right hip-replacement  . Knee arthroscopy     LT  . Total knee arthroplasty 15yrs ago    left  . Appendectomy 30yrs ago  . Abdominal hysterectomy 38yrs ago   . Lithotripsy 2011  . Cataract surgery  2012    left  . Eye surgery 46yrs ago  . Bladder surgery 2001    tacked  . Coronary angioplasty with stent placement 2010    2 in rt coronary artery and 1 in another spot  . Colonoscopy   . Esophagogastroduodenoscopy      MEDICATIONS:   (Not in a hospital admission)  ALLERGIES:   Allergies  Allergen Reactions  . Codeine Hives    REVIEW OF SYSTEMS:  Pertinent items are noted in HPI.   FAMILY HISTORY:   Family History  Problem Relation Age of Onset  . Anesthesia problems Mother   . Hypotension Neg Hx   . Malignant hyperthermia Neg Hx   . Pseudochol deficiency Neg Hx     SOCIAL HISTORY:   History  Substance Use Topics  . Smoking status: Former Smoker  . Smokeless tobacco: Not on file   Comment: quit several yrs ago  . Alcohol Use: Yes     2 times week alcohol     EXAMINATION:  Vital signs in last 24 hours: @VSRANGES@  General appearance: alert, cooperative and no distress Lungs: clear to auscultation bilaterally Heart: regular rate and rhythm, S1, S2 normal, no murmur, click, rub   or gallop Abdomen: soft, non-tender; bowel sounds normal; no masses,  no organomegaly Extremities: extremities normal, atraumatic, no cyanosis or edema and Homans sign is negative, no sign of DVT Pulses: 2+ and symmetric Skin: Skin color, texture, turgor normal. No rashes or lesions Neurologic: Alert and oriented X 3, normal strength and tone. Normal symmetric reflexes. Normal coordination and gait  Musculoskeletal:  ROM 0-110, Ligaments intact,  Imaging Review Plain radiographs demonstrate severe degenerative joint disease of the right knee. The overall alignment is mild varus. The bone quality appears to be good for age and reported activity level.  Assessment/Plan: End stage arthritis, right knee   The patient history, physical examination and imaging studies are consistent with advanced degenerative joint disease of the right knee. The patient has failed conservative  treatment.  The clearance notes were reviewed.  After discussion with the patient it was felt that Total Knee Replacement was indicated. The procedure,  risks, and benefits of total knee arthroplasty were presented and reviewed. The risks including but not limited to aseptic loosening, infection, blood clots, vascular injury, stiffness, patella tracking problems complications among others were discussed. The patient acknowledged the explanation, agreed to proceed with the plan.  Valiant Dills 09/08/2011, 3:58 PM   

## 2011-09-09 NOTE — Transfer of Care (Signed)
Immediate Anesthesia Transfer of Care Note  Patient: Jodi Frazier  Procedure(s) Performed: Procedure(s) (LRB): TOTAL KNEE ARTHROPLASTY (Right)  Patient Location: PACU  Anesthesia Type: General  Level of Consciousness: awake, alert , oriented and sedated  Airway & Oxygen Therapy: Patient Spontanous Breathing and Patient connected to nasal cannula oxygen  Post-op Assessment: Report given to PACU RN, Post -op Vital signs reviewed and stable and Patient moving all extremities  Post vital signs: Reviewed  Complications: No apparent anesthesia complications

## 2011-09-09 NOTE — Interval H&P Note (Signed)
History and Physical Interval Note:  09/09/2011 7:36 AM  Jodi Frazier  has presented today for surgery, with the diagnosis of osteoarthritis right knee  The various methods of treatment have been discussed with the patient and family. After consideration of risks, benefits and other options for treatment, the patient has consented to  Procedure(s) (LRB): TOTAL KNEE ARTHROPLASTY (Right) as a surgical intervention .  The patients' history has been reviewed, patient examined, no change in status, stable for surgery.  I have reviewed the patients' chart and labs.  Questions were answered to the patient's satisfaction.     Marcel Sorter,STEPHEN D

## 2011-09-09 NOTE — Anesthesia Procedure Notes (Signed)
Anesthesia Regional Block:  Femoral nerve block  Pre-Anesthetic Checklist: ,, timeout performed, Correct Patient, Correct Site, Correct Laterality, Correct Procedure, Correct Position, site marked, Risks and benefits discussed, at surgeon's request and post-op pain management  Laterality: Right and Upper  Prep: Maximum Sterile Barrier Precautions used and Dura Prep       Needles:  Injection technique: Single-shot  Needle Type: Stimulator Needle - 80      Needle Gauge: 22 and 22 G  Needle insertion depth: 6 cm   Additional Needles:  Procedures: nerve stimulator Femoral nerve block  Nerve Stimulator or Paresthesia:  Response: Twitch elicited, 0.8 mA,   Additional Responses:   Narrative:  Start time: 09/09/2011 8:05 AM End time: 09/09/2011 8:23 AM  Performed by: Personally  Anesthesiologist: Alma Friendly, MD  Additional Notes: BP cuff, EKG monitors applied. Sedation begun. Femoral artery palpated for location of nerve. After nerve location anesthetic injected incrementally, slowly , and after neg aspirations. Tolerated well.  Femoral nerve block

## 2011-09-10 LAB — CBC
HCT: 34.8 % — ABNORMAL LOW (ref 36.0–46.0)
Hemoglobin: 11.2 g/dL — ABNORMAL LOW (ref 12.0–15.0)
MCH: 28.9 pg (ref 26.0–34.0)
MCHC: 32.2 g/dL (ref 30.0–36.0)
MCV: 89.7 fL (ref 78.0–100.0)
Platelets: 269 10*3/uL (ref 150–400)
RBC: 3.88 MIL/uL (ref 3.87–5.11)
RDW: 14 % (ref 11.5–15.5)
WBC: 16.6 10*3/uL — ABNORMAL HIGH (ref 4.0–10.5)

## 2011-09-10 LAB — BASIC METABOLIC PANEL
BUN: 17 mg/dL (ref 6–23)
CO2: 23 mEq/L (ref 19–32)
Calcium: 9.2 mg/dL (ref 8.4–10.5)
Chloride: 103 mEq/L (ref 96–112)
Creatinine, Ser: 0.95 mg/dL (ref 0.50–1.10)
GFR calc Af Amer: 65 mL/min — ABNORMAL LOW (ref >90)
GFR calc non Af Amer: 56 mL/min — ABNORMAL LOW (ref >90)
Glucose, Bld: 122 mg/dL — ABNORMAL HIGH (ref 70–99)
Potassium: 4.7 mEq/L (ref 3.5–5.1)
Sodium: 134 mEq/L — ABNORMAL LOW (ref 135–145)

## 2011-09-10 MED ORDER — CEFAZOLIN SODIUM-DEXTROSE 2-3 GM-% IV SOLR
2.0000 g | Freq: Once | INTRAVENOUS | Status: AC
  Administered 2011-09-10: 2 g via INTRAVENOUS
  Filled 2011-09-10: qty 50

## 2011-09-10 NOTE — Progress Notes (Signed)
Physical Therapy Evaluation Patient Details Name: Jodi Frazier MRN: [id number] DOB: 09/20/1930 Today's Date: 09/10/2011  Problem List: There is no problem list on file for this patient.   Past Medical History:  Past Medical History  Diagnosis Date  . Hypertension   . Colitis   . Coronary artery disease   . High cholesterol     takes Red Yeast Rice and Fish OIl daily  . Kidney stones   . PONV (postoperative nausea and vomiting)   . MI (myocardial infarction) 2010  . Shortness of breath     with exertion  . Pneumonia     hx of 20+yrs ago  . COPD (chronic obstructive pulmonary disease)     pleurisy or COPD exascerbation > 79yr ago  . Headache   . Vertigo     HTN related bc gets up too fast  . Arthritis   . Bruises easily   . History of shingles 48yrs ago    in eye;occasionally gets some residual   . Accessory skin tags     arms/legs  . GERD (gastroesophageal reflux disease)   . Colitis   . Diverticulosis   . Urinary frequency   . Stress incontinence   . History of kidney stones   . Hypothyroidism     takes sYnthroid daily  . Cataract     early stage on right  . Macular degeneration, wet   . Depression     some but takes Amitryptylline nightly   Past Surgical History:  Past Surgical History  Procedure Date  . Hip arthroscopy 71yr ago    right hip-replacement  . Knee arthroscopy     LT  . Total knee arthroplasty 60yrs ago    left  . Appendectomy 17yrs ago  . Abdominal hysterectomy 14yrs ago   . Lithotripsy 2011  . Cataract surgery 2012    left  . Eye surgery 3yrs ago  . Bladder surgery 2001    tacked  . Coronary angioplasty with stent placement 2010    2 in rt coronary artery and 1 in another spot  . Colonoscopy   . Esophagogastroduodenoscopy     PT Assessment/Plan/Recommendation PT Assessment Clinical Impression Statement: 78 yo female s/p RTKA presents with decr functional mobility and impairments listed below; will benefit from PT to maximize  independence and safety with mobility, amb, steps and to enable safe dc home with prn familiy assist PT Recommendation/Assessment: Patient will need skilled PT in the acute care venue PT Problem List: Decreased strength;Decreased range of motion;Decreased activity tolerance;Decreased balance;Decreased mobility;Decreased knowledge of use of DME;Pain PT Therapy Diagnosis : Difficulty walking;Acute pain PT Plan PT Frequency: 7X/week PT Treatment/Interventions: DME instruction;Gait training;Stair training;Functional mobility training;Therapeutic activities;Therapeutic exercise;Patient/family education PT Recommendation Recommendations for Other Services: OT consult Follow Up Recommendations: Home health PT;Supervision/Assistance - 24 hour Equipment Recommended: Rolling walker with 5" wheels;3 in 1 bedside comode PT Goals  Acute Rehab PT Goals PT Goal Formulation: With patient Time For Goal Achievement: 7 days Pt will go Supine/Side to Sit: with supervision PT Goal: Supine/Side to Sit - Progress: Goal set today Pt will go Sit to Supine/Side: with supervision PT Goal: Sit to Supine/Side - Progress: Goal set today Pt will go Sit to Stand: with supervision PT Goal: Sit to Stand - Progress: Goal set today Pt will go Stand to Sit: with supervision PT Goal: Stand to Sit - Progress: Goal set today Pt will Ambulate: >150 feet;with supervision;with rolling walker PT Goal: Ambulate - Progress: Goal set today  Pt will Go Up / Down Stairs: 1-2 stairs;with supervision;with rail(s) PT Goal: Up/Down Stairs - Progress: Goal set today Pt will Perform Home Exercise Program: with supervision, verbal cues required/provided PT Goal: Perform Home Exercise Program - Progress: Goal set today  PT Evaluation Precautions/Restrictions  Precautions Precautions: Knee Restrictions Weight Bearing Restrictions: Yes RLE Weight Bearing: Weight bearing as tolerated Prior Functioning  Home Living Lives With:  Family Receives Help From: Family Type of Home: House Home Layout: One level Home Access: Stairs to enter Entrance Stairs-Rails: Left Entrance Stairs-Number of Steps: 2 Bathroom Shower/Tub: Heritage manager Accessibility: Yes How Accessible: Accessible via walker Home Adaptive Equipment: Bedside commode/3-in-1;Walker - standard;Built-in shower seat Prior Function Level of Independence: Independent with homemaking with ambulation Driving: Yes Cognition Cognition Arousal/Alertness: Awake/alert Overall Cognitive Status: Appears within functional limits for tasks assessed (although easily distractable) Orientation Level: Oriented X4 Sensation/Coordination Sensation Light Touch: Appears Intact Coordination Gross Motor Movements are Fluid and Coordinated: Yes Fine Motor Movements are Fluid and Coordinated: Yes Extremity Assessment RUE Assessment RUE Assessment: Within Functional Limits LUE Assessment LUE Assessment: Within Functional Limits RLE Assessment RLE Assessment: Exceptions to [facility] RLE Strength RLE Overall Strength Comments: Grossly decr AROM and strength, limited by pain; quad activation present, but weak, likley result of nerve block LLE Assessment LLE Assessment: Within Functional Limits Mobility (including Balance) Bed Mobility Bed Mobility: Yes Supine to Sit: 3: Mod assist;With rails Supine to Sit Details (indicate cue type and reason): cues for technique and safety; physical assist to lift trunk off of bed Sitting - Scoot to Edge of Bed: 4: Min assist Sitting - Scoot to [place] of Bed Details (indicate cue type and reason): min assist for safety Transfers Transfers: Yes Sit to Stand: 1: +2 Total assist;Patient percentage (comment);From bed (pt=50 progressing t 65%) Sit to Stand Details (indicate cue type and reason): step-by-step cues for safety, hand placement, use stronger LLE for lift; RLE buckled with first rep of sit to stand, and pt required +2 assist to  prevent fall back to bed Stand to Sit: 1: +2 Total assist;Patient percentage (comment);To chair/3-in-1;With upper extremity assist (pt=65%) Stand to Sit Details: physical assist to control descent; cues for hand placement, control, and RLE positioning for comfort Ambulation/Gait Ambulation/Gait: Yes Ambulation/Gait Assistance: 1: +2 Total assist;Patient percentage (comment) (pt=60%) Ambulation/Gait Assistance Details (indicate cue type and reason): near constant step-by-step cues for gait sequence and to work to activate R quad for R stance stability; pt's right knee buckled a lot, and required physical block for stance  stability Ambulation Distance (Feet): 5 Feet Assistive device: Rolling walker Gait Pattern: Step-to pattern;Decreased stance time - right    Exercise  Total Joint Exercises Quad Sets: AROM;Right;5 reps;Seated (in recliner) Heel Slides: AAROM;5 reps;Supine End of Session PT - End of Session Equipment Utilized During Treatment: Gait belt Activity Tolerance: Patient tolerated treatment well Patient left: in chair;with call bell in reach;with family/visitor present Nurse Communication: Mobility status for transfers General Behavior During Session: [facility] for tasks performed Cognition: [facility] for tasks performed (Easily distractable)  Van Clines [facility] [place], [place] [phone]   09/10/2011, 12:33 PM

## 2011-09-10 NOTE — Progress Notes (Signed)
Physical Therapy Note   09/10/11 1730  PT Visit Information  Last PT Received On 09/10/11  Precautions  Precautions Knee  Restrictions  RLE Weight Bearing WBAT   Bed Mobility  Supine to Sit 4: Min assist;With rails  Supine to Sit Details (indicate cue type and reason) safety cues and technique cues  Sitting - Scoot to Edge of Bed 4: Min assist  Sitting - Scoot to [place] of Bed Details (indicate cue type and reason) safety cues  Transfers  Sit to Stand 3: Mod assist;From bed   Sit to Stand Details (indicate cue type and reason) continued step-by-step cues for technique and safety; very dependent on UE support  Stand to Sit 4: Min assist;With upper extremity assist;To chair/3-in-1 (  Stand to Sit Details cues for safety, ahnd placement, and control of descent to chair; one uncontrolled sit back to bed as we were working on standing R Knee stability   Ambulation/Gait  Ambulation/Gait Assistance 3: Mod assist  Ambulation/Gait Assistance Details (indicate cue type and reason) cues for gait sequence, and to activate R quad in stance for stability; physical assist to advance WR  Ambulation Distance (Feet) 3 Feet  Assistive device Rolling walker  Gait Pattern Step-to pattern;Decreased stance time - right  PT - End of Session  Equipment Utilized During Treatment Gait belt  Activity Tolerance Patient tolerated treatment well  Patient left in chair;with call bell in reach  Nurse Communication Mobility status for transfers  General  Behavior During Session [facility] for tasks performed  Cognition [facility] for tasks performed  PT - Assessment/Plan  Comments on Treatment Session Improving R knee stance stability, but still needs work; Noted that pt seems to be better able to activate quad for knee control in stance while she is also performing a gluteal set; anticipate that she will continue to imporve; will likely be able to dc home tomorrow, but will likely need 2 PT sessions  PT Plan Discharge plan  remains appropriate  PT Frequency 7X/week  Recommendations for Other Services OT consult  Follow Up Recommendations Home health PT;Supervision/Assistance - 24 hour  Equipment Recommended Rolling walker with 5" wheels;3 in 1 bedside comode  Acute Rehab PT Goals  Time For Goal Achievement 7 days  Pt will go Supine/Side to Sit with supervision  PT Goal: Supine/Side to Sit - Progress Progressing toward goal  Pt will go Sit to Stand with supervision  PT Goal: Sit to Stand - Progress Progressing toward goal  Pt will go Stand to Sit with supervision  PT Goal: Stand to Sit - Progress Progressing toward goal  Pt will Ambulate >150 feet;with supervision;with rolling walker  PT Goal: Ambulate - Progress Progressing toward goal     Van Clines, [place] [phone]

## 2011-09-10 NOTE — Progress Notes (Signed)
Georgena Spurling, MD   Altamese Cabal, PA-C [address] [place], [place], [place]  [address]                             [phone]   PROGRESS NOTE  Subjective:  negative for Chest Pain  negative for Shortness of Breath  negative for Nausea/Vomiting   negative for Calf Pain  negative for Bowel Movement   Tolerating Diet: yes         Patient reports pain as 6 on 0-10 scale.    Objective: Vital signs in last 24 hours:   Patient Vitals for the past 24 hrs:  BP Temp Temp src Pulse Resp SpO2  09/10/11 0537 119/50 mmHg 97.9 F (36.6 C) - 70  18  98 %  09/10/11 0140 132/64 mmHg 97.5 F (36.4 C) - 85  20  94 %  09/09/11 2140 164/67 mmHg 97.4 F (36.3 C) Oral 79  18  94 %  09/09/11 1440 131/47 mmHg 97.4 F (36.3 C) Oral 64  18  97 %  09/09/11 1200 110/50 mmHg 97.4 F (36.3 C) - 64  18  97 %  09/09/11 1149 - - - 64  13  94 %  09/09/11 1148 - - - 65  12  94 %  09/09/11 1147 - - - 64  12  93 %  09/09/11 1146 - - - 65  13  93 %  09/09/11 1145 - - - 64  12  95 %  09/09/11 1144 139/53 mmHg - - 64  12  94 %  09/09/11 1143 - - - 64  9  97 %  09/09/11 1142 - - - 67  12  98 %  09/09/11 1141 - - - 67  21  99 %  09/09/11 1140 145/47 mmHg 97.6 F (36.4 C) - 66  16  99 %  09/09/11 1139 - - - 67  14  99 %  09/09/11 1053 - 97.6 F (36.4 C) - - - 96 %    @flow {1959:LAST@   Intake/Output from previous day:   03/11 0701 - 03/12 0700 In: 1940 [P.O.:580; I.V.:1300] Out: 1175 [Urine:825; Drains:300]   Intake/Output this shift:       Intake/Output      03/11 0701 - 03/12 0700 03/12 0701 - 03/13 0700   P.O. 580    I.V. 1300    Other 60    Total Intake 1940    Urine 825    Drains 300    Blood 50    Total Output 1175    Net +765            LABORATORY DATA:  Basename 09/10/11 0625  WBC 16.6*  HGB 11.2*  HCT 34.8*  PLT 269    Basename 09/10/11 0625  NA 134*  K 4.7  CL 103  CO2 23  BUN 17  CREATININE 0.95  GLUCOSE 122*  CALCIUM 9.2   Lab Results  Component Value  Date   INR 0.95 08/29/2011    Examination:  General appearance: alert, cooperative and no distress Extremities: Homans sign is negative, no sign of DVT  Wound Exam: clean, dry, intact   Drainage:  Scant/small amount Serosanguinous exudate  Motor Exam: EHL and FHL Intact  Sensory Exam: Deep Peroneal normal  Vascular Exam:    Assessment:    1 Day Post-Op  Procedure(s) (LRB): TOTAL KNEE ARTHROPLASTY (Right)  ADDITIONAL DIAGNOSIS:  Active  Problems:  * No active hospital problems. *   Acute Blood Loss Anemia   Plan: Physical Therapy as ordered Weight Bearing as Tolerated (WBAT)  DVT Prophylaxis:  Lovenox  DISCHARGE PLAN: Home  DISCHARGE NEEDS: HHPT, CPM, Walker and 3-in-1 comode seat         Bryler Dibble 09/10/2011, 8:24 AM

## 2011-09-10 NOTE — Progress Notes (Signed)
Clinical Social Work-CSW received referral for [employer]; reviewed chart and discussed with treatment team-Pt plan is to d/c home with home health-No CSW needs-Sign off-Britlyn Martine-MSW, [phone]

## 2011-09-10 NOTE — Progress Notes (Signed)
Occupational Therapy Evaluation Patient Details Name: SHAYLYN BAWA MRN: [id number] DOB: 10-10-1930 Today's Date: 09/10/2011  Problem List: There is no problem list on file for this patient.   Past Medical History:  Past Medical History  Diagnosis Date  . Hypertension   . Colitis   . Coronary artery disease   . High cholesterol     takes Red Yeast Rice and Fish OIl daily  . Kidney stones   . PONV (postoperative nausea and vomiting)   . MI (myocardial infarction) 2010  . Shortness of breath     with exertion  . Pneumonia     hx of 78yrs ago  . COPD (chronic obstructive pulmonary disease)     pleurisy or COPD exascerbation > 78yr ago  . Headache   . Vertigo     HTN related bc gets up too fast  . Arthritis   . Bruises easily   . History of shingles 78yrs ago    in eye;occasionally gets some residual   . Accessory skin tags     arms/legs  . GERD (gastroesophageal reflux disease)   . Colitis   . Diverticulosis   . Urinary frequency   . Stress incontinence   . History of kidney stones   . Hypothyroidism     takes sYnthroid daily  . Cataract     early stage on right  . Macular degeneration, wet   . Depression     some but takes Amitryptylline nightly   Past Surgical History:  Past Surgical History  Procedure Date  . Hip arthroscopy 78yr ago    right hip-replacement  . Knee arthroscopy     LT  . Total knee arthroplasty 78yrs ago    left  . Appendectomy 78yrs ago  . Abdominal hysterectomy 78yrs ago   . Lithotripsy 2011  . Cataract surgery 2012    left  . Eye surgery 78yrs ago  . Bladder surgery 2001    tacked  . Coronary angioplasty with stent placement 2010    2 in rt coronary artery and 1 in another spot  . Colonoscopy   . Esophagogastroduodenoscopy     OT Assessment/Plan/Recommendation OT Assessment Clinical Impression Statement: 78 yo s/p R TKA. Pt plans to D/C home with support from family for as long as needed. Completetd all education regarding  ADL retraining and available AE. Pt/daughter verbalized understanding. No further OT needed. OT signing off. Thank for referral. OT Recommendation/Assessment: Patient does not need any further OT services OT Recommendation Follow Up Recommendations: No OT follow up Equipment Recommended: Rolling walker with 5" wheels;3 in 1 bedside comode OT Goals Acute Rehab OT Goals OT Goal Formulation:  (eval only)  OT Evaluation Precautions/Restrictions  Precautions Precautions: Knee Restrictions Weight Bearing Restrictions: Yes RLE Weight Bearing: Weight bearing as tolerated (Simultaneous filing. User may not have seen previous data.) Prior Functioning Home Living Lives With: Family Receives Help From: Family Type of Home: House Home Layout: One level Home Access: Stairs to enter Entrance Stairs-Rails: Left Entrance Stairs-Number of Steps: 2 Bathroom Shower/Tub: Health visitor: Handicapped height Bathroom Accessibility: Yes How Accessible: Accessible via walker Home Adaptive Equipment: Bedside commode/3-in-1;Walker - standard;Built-in shower seat Prior Function Level of Independence: Independent with homemaking with ambulation Driving: Yes ADL ADL Eating/Feeding: Performed;Independent Where Assessed - Eating/Feeding: Chair Grooming: Simulated;Set up Where Assessed - Grooming: Sitting, chair Upper Body Bathing: Simulated;Set up Where Assessed - Upper Body Bathing: Sitting, chair;Supported Lower Body Bathing: Simulated;Moderate assistance Where Assessed - Lower Body Bathing:  Sit to stand from chair;Unsupported Upper Body Dressing: Simulated;Set up Where Assessed - Upper Body Dressing: Sitting, chair Lower Body Dressing: Performed;Moderate assistance Where Assessed - Lower Body Dressing: Sit to stand from chair;Unsupported Toilet Transfer: Simulated;Minimal assistance Toilet Transfer Method: Stand pivot Acupuncturist: Gaffer  Method: Not assessed Vision/Perception  Vision - History Baseline Vision: No visual deficits Perception Perception: Within Functional Limits Praxis Praxis: Intact Cognition Cognition Arousal/Alertness: Awake/alert Overall Cognitive Status: Impaired (although easily distractable) Memory: Appears impaired Memory Deficits: mostlikely affected by pain meds Orientation Level: Oriented X4 Safety/Judgement: Good awareness of safety precautions Problem Solving: Requires assistance for problem solving (delayed) Cognition - Other Comments: Cognitive deficits most likely related to medication. Daughter present and is aware. Sensation/Coordination Sensation Light Touch: Appears Intact Coordination Gross Motor Movements are Fluid and Coordinated: Yes Fine Motor Movements are Fluid and Coordinated: Yes Extremity Assessment RUE Assessment RUE Assessment: Within Functional Limits LUE Assessment LUE Assessment: Within Functional Limits Mobility  Transfers Sit to Stand: From bed;4: Min assist (Simultaneous filing. User may not have seen previous data.) Sit to Stand Details (indicate cue type and reason): continued step-by-step cues for technique and safety; very dependent on UE support Stand to Sit: 4: Min assist;With upper extremity assist;To chair/3-in-1 (Simultaneous filing. User may not have seen previous data.) Stand to Sit Details: cues for safety, ahnd placement, and control of descent to chair; one uncontrolled sit back to bed as we were working on standing R Knee stability  Exercises   End of Session OT - End of Session Equipment Utilized During Treatment: Gait belt Activity Tolerance: Patient tolerated treatment well Patient left: in chair;with call bell in reach;with family/visitor present General Behavior During Session: [facility] for tasks performed Cognition: Impaired (most likely due to meds)   Kittie Krizan,HILLARY 09/10/2011, 5:41 PM  [facility], OTR/L  [phone] 09/10/2011

## 2011-09-11 ENCOUNTER — Encounter (HOSPITAL_COMMUNITY): Payer: Self-pay | Admitting: Orthopedic Surgery

## 2011-09-11 LAB — BASIC METABOLIC PANEL
BUN: 19 mg/dL (ref 6–23)
Chloride: 108 mEq/L (ref 96–112)
Creatinine, Ser: 1.01 mg/dL (ref 0.50–1.10)
Glucose, Bld: 97 mg/dL (ref 70–99)
Potassium: 4.1 mEq/L (ref 3.5–5.1)

## 2011-09-11 LAB — CBC
HCT: 30.4 % — ABNORMAL LOW (ref 36.0–46.0)
Hemoglobin: 9.6 g/dL — ABNORMAL LOW (ref 12.0–15.0)
MCHC: 31.6 g/dL (ref 30.0–36.0)
MCV: 93 fL (ref 78.0–100.0)
WBC: 10.4 10*3/uL (ref 4.0–10.5)

## 2011-09-11 MED ORDER — CELECOXIB 200 MG PO CAPS: 200.0000 mg | ORAL_CAPSULE | Freq: Two times a day (BID) | ORAL | Status: AC

## 2011-09-11 MED ORDER — ENOXAPARIN SODIUM 40 MG/0.4ML ~~LOC~~ SOLN: 40.0000 mg | SUBCUTANEOUS | Status: DC

## 2011-09-11 MED ORDER — OXYCODONE HCL 5 MG PO TABS: 5.0000 mg | ORAL_TABLET | ORAL | Status: AC | PRN

## 2011-09-11 MED ORDER — METHOCARBAMOL 500 MG PO TABS: 500.0000 mg | ORAL_TABLET | Freq: Four times a day (QID) | ORAL | Status: AC | PRN

## 2011-09-11 NOTE — Progress Notes (Signed)
Physical Therapy Treatment Patient Details Name: Jodi Frazier MRN: [id number] DOB: Sep 12, 1930 Today's Date: 09/11/2011  PT Assessment/Plan  PT - Assessment/Plan Comments on Treatment Session:   Definite improvements noted;   Still pt has Right stance instability, requiring near constant verbal and tactile cueing to activate R quad in stance;   Feel pt will need another PT session today, and likely would benefit from another day in hospital for more PT training tomorrow;   RN notified PT Plan: Discharge plan remains appropriate PT Frequency: 7X/week Follow Up Recommendations: Home health PT;Supervision/Assistance - 24 hour Equipment Recommended: Rolling walker with 5" wheels;3 in 1 bedside comode PT Goals  Acute Rehab PT Goals Time For Goal Achievement: 7 days Pt will go Supine/Side to Sit: with supervision Pt will go Sit to Stand: with supervision Pt will go Stand to Sit: with supervision Pt will Ambulate: >150 feet;with supervision;with rolling walker  PT Treatment Precautions/Restrictions  Precautions Precautions: Knee Restrictions Weight Bearing Restrictions: Yes RLE Weight Bearing: Weight bearing as tolerated Mobility (including Balance) Bed Mobility Supine to Sit: 4: Min assist (without physical contact) Supine to Sit Details (indicate cue type and reason): cues for technique and safety Sitting - Scoot to Edge of Bed: 4: Min assist (without physical contact) Sitting - Scoot to Edge of Bed Details (indicate cue type and reason): cues for safety, distance to scoot to EOB for best sit to stand pre-positioning Transfers Sit to Stand: From bed;4: Min assist Sit to Stand Details (indicate cue type and reason): cues for pre-positioning RLE and LLE for optimal sit to stand Stand to Sit: 4: Min assist;With upper extremity assist;To chair/3-in-1 Stand to Sit Details: continued cues for safety, positioning RLE for comfort, hand placement Ambulation/Gait Ambulation/Gait  Assistance: 4: Min assist Ambulation/Gait Assistance Details (indicate cue type and reason): Constant cues for sequence and to activate R quad for stance stability; improvements noted, but also with some buckling, cues also to support self with UEs on RW Ambulation Distance (Feet): 40 Feet Assistive device: Rolling walker Gait Pattern: Step-to pattern;Decreased stance time - right Stairs: No    Exercise  Total Joint Exercises Ankle Circles/Pumps: AROM;Both;20 reps;Supine Quad Sets: AROM;Right;Seated;15 reps End of Session PT - End of Session Equipment Utilized During Treatment: Gait belt Activity Tolerance: Patient tolerated treatment well Patient left: in chair;with call bell in reach Nurse Communication: Mobility status for transfers General Behavior During Session: [facility] for tasks performed (pt appropriately voiced concern re: being able to manage ) Cognition:  (continues to require near constant cues to attend to task)  Van Clines [facility] [place], [place] [phone]  09/11/2011, 9:37 AM

## 2011-09-11 NOTE — Progress Notes (Signed)
  Jodi Spurling, MD   Jodi Cabal, PA-C [address] [place], [place], [place]  [address]                             [phone]   PROGRESS NOTE  Subjective:  negative for Chest Pain  negative for Shortness of Breath  negative for Nausea/Vomiting   negative for Calf Pain  negative for Bowel Movement   Tolerating Diet: yes         Patient reports pain as 3 on 0-10 scale.    Objective: Vital signs in last 24 hours:   Patient Vitals for the past 24 hrs:  BP Temp Pulse Resp SpO2  09/11/11 0556 125/67 mmHg 97.6 F (36.4 C) 71  18  97 %  09/10/11 2211 137/64 mmHg 97.5 F (36.4 C) 79  20  92 %  09/10/11 1458 127/60 mmHg 97.9 F (36.6 C) 73  18  96 %    @flow {1959:LAST@   Intake/Output from previous day:   03/12 0701 - 03/13 0700 In: 1140 [P.O.:240; I.V.:900] Out: 1550 [Urine:1550]   Intake/Output this shift:       Intake/Output      03/12 0701 - 03/13 0700 03/13 0701 - 03/14 0700   P.O. 240    I.V. 900    Other     Total Intake 1140    Urine 1550    Drains     Blood     Total Output 1550    Net -410            LABORATORY DATA:  Basename 09/11/11 0500 09/10/11 0625  WBC 10.4 16.6*  HGB 9.6* 11.2*  HCT 30.4* 34.8*  PLT 225 269    Basename 09/11/11 0500 09/10/11 0625  NA 139 134*  K 4.1 4.7  CL 108 103  CO2 26 23  BUN 19 17  CREATININE 1.01 0.95  GLUCOSE 97 122*  CALCIUM 8.8 9.2   Lab Results  Component Value Date   INR 0.95 08/29/2011    Examination:  General appearance: alert, cooperative and no distress Extremities: Homans sign is negative, no sign of DVT  Wound Exam: clean, dry, intact   Drainage:  None: wound tissue dry  Motor Exam: EHL and FHL Intact  Sensory Exam: Deep Peroneal normal  Vascular Exam:    Assessment:    2 Days Post-Op  Procedure(s) (LRB): TOTAL KNEE ARTHROPLASTY (Right)  ADDITIONAL DIAGNOSIS:  Active Problems:  * No active hospital problems. *   Acute Blood Loss Anemia   Plan: Physical Therapy as  ordered Weight Bearing as Tolerated (WBAT)  DVT Prophylaxis:  Lovenox  DISCHARGE PLAN: Home  DISCHARGE NEEDS: HHPT, CPM, Walker and 3-in-1 comode seat         Jodi Frazier 09/11/2011, 7:53 AM

## 2011-09-11 NOTE — Discharge Instructions (Signed)
Diet: As you were doing prior to hospitalization   Activity:  Increase activity slowly as tolerated                  No lifting or driving for 6 weeks  Shower:  May shower without a dressing once there is no drainage from your wound.                 Do NOT wash over the wound.  If drainage remains, cover wound with saran                  Wrap and then shower.  Clean incision with betadine and change dressing                        After saran wrap removed.  Dressing:  You may change your dressing on Thursday                    Then change the dressing daily with sterile 4"x4"s gauze dressing                     And TED hose for knees.  Use paper tape to hold dressing in place                     For hips.  You may clean the incision with alcohol prior to redressing.  Weight Bearing:  Weight bearing as tolerated as taught in physical therapy.  Use a                                walker or Crutches as instructed.  To prevent constipation: you may use a stool softener such as -               Colace ( over the counter) 100 mg by mouth twice a day                Drink plenty of fluids ( prune juice may be helpful) and high fiber foods                Miralax ( over the counter) for constipation as needed.    Precautions:  If you experience chest pain or shortness of breath - call 911 immediately               For transfer to the hospital emergency department!!               If you develop a fever greater that 101 F, purulent drainage from wound,                             increased redness or drainage from wound, or calf pain -- Call the office at                                                 [phone].  Follow- Up Appointment:  Please call for an appointment to be seen on 09/24/11                                                [place] - [phone]                   

## 2011-09-11 NOTE — Discharge Summary (Signed)
PATIENT ID:      Jodi Frazier  MRN:     [id number] DOB/AGE:    1930-09-24 / 78 y.o.     DISCHARGE SUMMARY  ADMISSION DATE:    09/09/2011 DISCHARGE DATE:   09/11/2011   ADMISSION DIAGNOSIS: osteoarthritis right knee  (osteoarthritis right knee)  DISCHARGE DIAGNOSIS:  osteoarthritis right knee    ADDITIONAL DIAGNOSIS: Active Problems:  * No active hospital problems. *   Past Medical History  Diagnosis Date  . Hypertension   . Colitis   . Coronary artery disease   . High cholesterol     takes Red Yeast Rice and Fish OIl daily  . Kidney stones   . PONV (postoperative nausea and vomiting)   . MI (myocardial infarction) 2010  . Shortness of breath     with exertion  . Pneumonia     hx of 20+yrs ago  . COPD (chronic obstructive pulmonary disease)     pleurisy or COPD exascerbation > 53yr ago  . Headache   . Vertigo     HTN related bc gets up too fast  . Arthritis   . Bruises easily   . History of shingles 60yrs ago    in eye;occasionally gets some residual   . Accessory skin tags     arms/legs  . GERD (gastroesophageal reflux disease)   . Colitis   . Diverticulosis   . Urinary frequency   . Stress incontinence   . History of kidney stones   . Hypothyroidism     takes sYnthroid daily  . Cataract     early stage on right  . Macular degeneration, wet   . Depression     some but takes Amitryptylline nightly    PROCEDURE: Procedure(s): TOTAL KNEE ARTHROPLASTY on 09/09/2011  CONSULTS:     HISTORY:  See H&P in chart  HOSPITAL COURSE:  Jodi Frazier is a 78 y.o. admitted on 09/09/2011 and found to have a diagnosis of osteoarthritis right knee.  After appropriate laboratory studies were obtained  they were taken to the operating room on 09/09/2011 and underwent Procedure(s): TOTAL KNEE ARTHROPLASTY.   They were given perioperative antibiotics:  Anti-infectives     Start     Dose/Rate Route Frequency Ordered Stop   09/10/11 0930   ceFAZolin (ANCEF) IVPB 2 g/50 mL  premix        2 g 100 mL/hr over 30 Minutes Intravenous  Once 09/10/11 0452 09/10/11 1000   09/09/11 1330   ceFAZolin (ANCEF) IVPB 2 g/50 mL premix        2 g 100 mL/hr over 30 Minutes Intravenous Every 6 hours 09/09/11 1250 09/10/11 0959   09/08/11 1400   ceFAZolin (ANCEF) IVPB 2 g/50 mL premix        2 g 100 mL/hr over 30 Minutes Intravenous 60 min pre-op 09/08/11 1346 09/09/11 0853        . Blood products given:none   The remainder of the hospital course was dedicated to ambulation and strengthening.   The patient was discharged on 2 Days Post-Op in  Good condition.   DIAGNOSTIC STUDIES: Recent vital signs: Patient Vitals for the past 24 hrs:  BP Temp Pulse Resp SpO2  09/11/11 0556 125/67 mmHg 97.6 F (36.4 C) 71  18  97 %  09/10/11 2211 137/64 mmHg 97.5 F (36.4 C) 79  20  92 %  09/10/11 1458 127/60 mmHg 97.9 F (36.6 C) 73  18  96 %  Recent laboratory studies:  Basename 09/11/11 0500 September 20, 2011 0625  WBC 10.4 16.6*  HGB 9.6* 11.2*  HCT 30.4* 34.8*  PLT 225 269    Basename 09/11/11 0500 20-Sep-2011 0625  NA 139 134*  K 4.1 4.7  CL 108 103  CO2 26 23  BUN 19 17  CREATININE 1.01 0.95  GLUCOSE 97 122*  CALCIUM 8.8 9.2   Lab Results  Component Value Date   INR 0.95 08/29/2011     Recent Radiographic Studies :  Dg Chest 2 View  08/29/2011  *RADIOLOGY REPORT*  Clinical Data: Preop.  CHEST - 2 VIEW  Comparison: None.  Findings: Trachea is midline.  Heart size normal.  Biapical pleural parenchymal scarring appears slightly asymmetric on the right, with a somewhat nodular component.  Lungs are mildly hyperinflated. Right middle lobe volume loss.  There may be volume loss and bronchiectasis in the right lower lobe as well. Suspect lingular scarring.  No pleural fluid. Rounded radiodense structure projecting over the mid thoracic spine may represent an osteophyte.  IMPRESSION:  1.  Biapical pleural parenchymal scarring, with asymmetry and slight nodularity on the  right. 2.  COPD with suspected right middle lobe, right lower lobe and lingular scarring. 3.  Comparison with prior exams would be helpful, if available. If prior exams are not available, CT chest without contrast could be considered. 4. These results will be called to the ordering clinician or representative by the Radiologist Assistant, and communication documented in the PACS Dashboard.  Original Report Authenticated By: Reyes Ivan, M.D.    DISCHARGE INSTRUCTIONS: Discharge Orders    Future Orders Please Complete By Expires   Diet - low sodium heart healthy      Call MD / Call 911      Comments:   If you experience chest pain or shortness of breath, CALL 911 and be transported to the hospital emergency room.  If you develope a fever above 101 F, pus (white drainage) or increased drainage or redness at the wound, or calf pain, call your surgeon's office.   Constipation Prevention      Comments:   Drink plenty of fluids.  Prune juice may be helpful.  You may use a stool softener, such as Colace (over the counter) 100 mg twice a day.  Use MiraLax (over the counter) for constipation as needed.   Increase activity slowly as tolerated      Weight Bearing as taught in Physical Therapy      Comments:   Use a walker or crutches as instructed.   Driving restrictions      Comments:   No driving for 6 weeks   Lifting restrictions      Comments:   No lifting for 6 weeks   CPM      Comments:   Continuous passive motion machine (CPM):      Use the CPM from 0 to 90 for 6-8 hours per day.      You may increase by 10 per day.  You may break it up into 2 or 3 sessions per day.      Use CPM for 2 weeks or until you are told to stop.   TED hose      Comments:   Use stockings (TED hose) for 3 weeks on both leg(s).  You may remove them at night for sleeping.   Change dressing      Comments:   Change dressing on thursday, then change the dressing daily with  sterile 4 x 4 inch gauze dressing and  apply TED hose.  You may clean the incision with alcohol prior to redressing.   Do not put a pillow under the knee. Place it under the heel.         DISCHARGE MEDICATIONS:   Medication List  As of 09/11/2011  7:59 AM   STOP taking these medications         aspirin 325 MG EC tablet      fish oil-omega-3 fatty acids 1000 MG capsule         TAKE these medications         amitriptyline 25 MG tablet   Commonly known as: ELAVIL   Take 25 mg by mouth at bedtime.      amLODipine 5 MG tablet   Commonly known as: NORVASC   Take 2.5 mg by mouth daily.      amlodipine-benazepril 2.5-10 MG per capsule   Commonly known as: LOTREL   Take 1 capsule by mouth daily.      carvedilol 6.25 MG tablet   Commonly known as: COREG   Take 3.125 mg by mouth 2 (two) times daily with a meal.      celecoxib 200 MG capsule   Commonly known as: CELEBREX   Take 1 capsule (200 mg total) by mouth 2 (two) times daily.      cycloSPORINE 0.05 % ophthalmic emulsion   Commonly known as: RESTASIS   Place 1 drop into both eyes daily.      donepezil 10 MG tablet   Commonly known as: ARICEPT   Take 10 mg by mouth at bedtime.      enoxaparin 40 MG/0.4ML Soln   Commonly known as: LOVENOX   Inject 0.4 mLs (40 mg total) into the skin daily.      Fluticasone-Salmeterol 250-50 MCG/DOSE Aepb   Commonly known as: ADVAIR   Inhale 1 puff into the lungs daily.      levothyroxine 112 MCG tablet   Commonly known as: SYNTHROID, LEVOTHROID   Take 112 mcg by mouth daily.      mesalamine 1.2 G EC tablet   Commonly known as: LIALDA   Take 1,200 mg by mouth daily with breakfast.      methocarbamol 500 MG tablet   Commonly known as: ROBAXIN   Take 1-2 tablets (500-1,000 mg total) by mouth every 6 (six) hours as needed.      nitroGLYCERIN 0.4 MG SL tablet   Commonly known as: NITROSTAT   Place 0.4 mg under the tongue every 5 (five) minutes as needed. For chest pain      oxyCODONE 5 MG immediate release tablet    Commonly known as: Oxy IR/ROXICODONE   Take 1-2 tablets (5-10 mg total) by mouth every 3 (three) hours as needed.      pantoprazole 40 MG tablet   Commonly known as: PROTONIX   Take 40 mg by mouth daily.      ramipril 10 MG capsule   Commonly known as: ALTACE   Take 10 mg by mouth daily.      Red Yeast Rice 600 MG Caps   Take 2 capsules by mouth at bedtime.      trimethoprim-polymyxin b ophthalmic solution   Commonly known as: POLYTRIM   Place 1 drop into the left eye every 6 (six) hours as needed. Irritation due to macular degeneration      VITAMIN B-12 IJ   Inject 1,000 mcg/mL as directed every 30 (  thirty) days.            FOLLOW UP VISIT:   Follow-up Information    Follow up with Raymon Mutton, MD. Call on 09/24/2011.   Contact information:   [address] [place] [address] [phone]          DISPOSITION:  Home  01-Home or Self Care  CONDITION:  Good   Danyelle Brookover 09/11/2011, 7:59 AM

## 2011-09-11 NOTE — Progress Notes (Signed)
UR COMPLETED  

## 2011-09-11 NOTE — Progress Notes (Signed)
Physical Therapy Treatment Patient Details Name: Jodi Frazier MRN: [id number] DOB: [birth year] Today's Date: 09/11/2011  PT Assessment/Plan  PT - Assessment/Plan Comments on Treatment Session: Patient plans on going home today but PT and PTA recommend another day in hospital for further therapy sessions to improve strenth and endurance before going home. Pt. continues to present as a fall risk and demonstrates fatigue and mild unsteadiness with gait. Commuicated concerns with nurse regarding discharge plans and he will speak to patient's daughter about it. Initiated stair training today with platform step from ortho gym brought into patient's room. Patient practiced forwards and backwards techniques with RW, she prefers forward technique even thought PTA recommended backwards technique due to high step at home. Did not require constant cues for gait this session versus earlier session.   PT Plan: Other (comment) (PT and PTA recommend one more night in hospital for therapy) PT Frequency: 7X/week Follow Up Recommendations: Home health PT;Supervision/Assistance - 24 hour Equipment Recommended: Rolling walker with 5" wheels;3 in 1 bedside comode PT Goals  Acute Rehab PT Goals PT Goal Formulation: With patient Time For Goal Achievement: 7 days PT Goal: Sit to Stand - Progress: Not met PT Goal: Stand to Sit - Progress: Not met PT Goal: Ambulate - Progress: Not met PT Goal: Up/Down Stairs - Progress: Not met  PT Treatment Precautions/Restrictions  Precautions Precautions: Knee Restrictions Weight Bearing Restrictions: Yes RLE Weight Bearing: Weight bearing as tolerated Mobility (including Balance) Transfers Transfers: Yes Sit to Stand: From chair/3-in-1;With armrests;With upper extremity assist;3: Mod assist Sit to Stand Details (indicate cue type and reason): sit to stand x 2. Required +2 total (A) (pt=70%) for initial stand, Mod A underarm +1 for second stand. Needed cues to reach for  walker once standing Stand to Sit: 4: Min assist;To chair/3-in-1;With armrests;With upper extremity assist Stand to Sit Details: cues for management of R LE/extend knee before initiating hip flexion.  Ambulation/Gait Ambulation/Gait: Yes Ambulation/Gait Assistance: 4: Min assist Ambulation/Gait Assistance Details (indicate cue type and reason): Pt. demonstrated some unsteadiness and fatigue. Req. cues to keep walker closer to body. Demonstrated step through pattern at times but mostly maintained step to pattern.  Pt improved with ability to return demonstration of sequencing without less cueing this session.   Ambulation Distance (Feet): 50 Feet Assistive device: Rolling walker Gait Pattern: Step-to pattern;Decreased stance time - right;Step-through pattern Stairs: Yes Stairs Assistance: 4: Min assist Stairs Assistance Details (indicate cue type and reason): Frequent cues for sequencing and technique. Pt. practiced forward and backwards techniques.  Stair Management Technique: No rails;Step to pattern;Backwards;Forwards;With walker Number of Stairs: 1 (3x's) Height of Stairs: 4   End of Session PT - End of Session Equipment Utilized During Treatment: Gait belt Activity Tolerance: Patient tolerated treatment well Patient left: in chair;with call bell in reach, with lunch tray.  Nurse Communication: Mobility status for ambulation, discharge plans.  General Behavior During Session: [facility] for tasks performed Cognition: [facility] for tasks performed  Ardyth Gal SPTA 09/11/2011, 1:11 PM   Verdell Face, PTA [phone] 09/11/2011

## 2011-09-16 NOTE — Op Note (Signed)
TOTAL KNEE REPLACEMENT OPERATIVE NOTE:  09/09/2011  1:54 PM  PATIENT:  Jodi Frazier  78 y.o. female  PRE-OPERATIVE DIAGNOSIS:  osteoarthritis right knee  POST-OPERATIVE DIAGNOSIS:  osteoarthritis right knee  PROCEDURE:  Procedure(s): TOTAL KNEE ARTHROPLASTY  SURGEON:  Surgeon(s): Raymon Mutton, MD  PHYSICIAN ASSISTANT: Altamese Cabal, [facility]  ANESTHESIA:   general  DRAINS: Hemovac and On-Q Marcaine Pain Pump  SPECIMEN: None  COUNTS:  Correct  TOURNIQUET:   Total Tourniquet Time Documented: Thigh (Right) - 46 minutes  DICTATION:  Indication for procedure:    The patient is a 78 y.o. female who has failed conservative treatment for osteoarthritis right knee.  Informed consent was obtained prior to anesthesia. The risks versus benefits of the operation were explain and in a way the patient can, and did, understand.   Description of procedure:     The patient was taken to the operating room and placed under anesthesia.  The patient was positioned in the usual fashion taking care that all body parts were adequately padded and/or protected.  I foley catheter was placed.  A tourniquet was applied and the leg prepped and draped in the usual sterile fashion.  The extremity was exsanguinated with the esmarch and tourniquet inflated to 350 mmHg.  Pre-operative range of motion was normal.  The knee was in 3 degree of mild varus.  A midline incision approximately 6-7 inches long was made with a #10 blade.  A new blade was used to make a parapatellar arthrotomy going 2-3 cm into the quadriceps tendon, over the patella, and alongside the medial aspect of the patellar tendon.  A synovectomy was then performed with the #10 blade and forceps. I then elevated the deep MCL off the medial tibial metaphysis subperiosteally around to the semimembranosus attachment.    I everted the patella and used calipers to measure patellar thickness.  I used the reamer to ream down to appropriate thickness  to recreate the native thickness.  I then removed excess bone with the rongeur and sagittal saw.  I used the appropriately sized template and drilled the three lug holes.  I then put the trial in place and measured the thickness with the calipers to ensure recreation of the native thickness.  The trial was then removed and the patella subluxed and the knee brought into flexion.  A homan retractor was place to retract and protect the patella and lateral structures.  A Z-retractor was place medially to protect the medial structures.  The extra-medullary alignment system was used to make cut the tibial articular surface perpendicular to the anamotic axis of the tibia and in 3 degrees of posterior slope.  The cut surface and alignment jig was removed.  I then used the intramedullary alignment guide to make a 6 valgus cut on the distal femur.  I then marked out the epicondylar axis on the distal femur.  The posterior condylar axis measured 3 degrees.  I then used the anterior referencing sizer and measured the femur to be a size E.  The 4-In-1 cutting block was screwed into place in external rotation matching the posterior condylar angle, making our cuts perpendicular to the epicondylar axis.  Anterior, posterior and chamfer cuts were made with the sagittal saw.  The cutting block and cut pieces were removed.  A lamina spreader was placed in 90 degrees of flexion.  The ACL, PCL, menisci, and posterior condylar osteophytes were removed.  A 12 mm spacer blocked was found to offer good flexion  and extension gap balance after minimal in degree releasing.   The scoop retractor was then placed and the femoral finishing block was pinned in place.  The small sagittal saw was used as well as the lug drill to finish the femur.  The block and cut surfaces were removed and the medullary canal hole filled with autograft bone from the cut pieces.  The tibia was delivered forward in deep flexion and external rotation.  A size 3  tray was selected and pinned into place centered on the medial 1/3 of the tibial tubercle.  The reamer and keel was used to prepare the tibia through the tray.    I then trialed with the size E femur, size 3 tibia, a 12 mm insert and the 32 patella.  I had excellent flexion/extension gap balance, excellent patella tracking.  Flexion was full and beyond 120 degrees; extension was zero.  These components were chosen and the staff opened them to me on the back table while the knee was lavaged copiously and the cement mixed.  I cemented in the components and removed all excess cement.  The polyethylene tibial component was snapped into place and the knee placed in extension while cement was hardening.  The capsule was infilltrated with 20cc of .25% Marcaine with epinephrine.  A hemovac was place in the joint exiting superolaterally.  A pain pump was place superomedially superficial to the arthrotomy.  Once the cement was hard, the tourniquet was let down.  Hemostasis was obtained.  The arthrotomy was closed with figure-8 #1 vicryl sutures.  The deep soft tissues were closed with #0 vicryls and the subcuticular layer closed with a running #2-0 vicryl.  The skin was reapproximated and closed with skin staples.  The wound was dressed with xeroform, 4 x4's, 2 ABD sponges, a single layer of webril and a TED stocking.   The patient was then awakened, extubated, and taken to the recovery room in stable condition.  BLOOD LOSS:  300cc DRAINS: 1 hemovac, 1 pain catheter COMPLICATIONS:  None.  PLAN OF CARE: Admit to inpatient   PATIENT DISPOSITION:  PACU - hemodynamically stable.   Delay start of Pharmacological VTE agent (>24hrs) due to surgical blood loss or risk of bleeding:  not applicable  Please fax a copy of this op note to my office at [phone] (please only include page 1 and 2 of the Case Information op note)

## 2012-08-14 LAB — PULMONARY FUNCTION TEST

## 2012-08-17 HISTORY — PX: BRONCHIAL BRUSH BIOPSY: SHX448

## 2012-08-19 ENCOUNTER — Telehealth: Payer: Self-pay | Admitting: Internal Medicine

## 2012-08-19 ENCOUNTER — Encounter: Payer: Self-pay | Admitting: *Deleted

## 2012-08-19 NOTE — Telephone Encounter (Signed)
Pt scheduled per Valene Bors.  Welcome packet mailed.

## 2012-08-19 NOTE — Progress Notes (Signed)
Daughter called.  I returned call.  She stated she needs an appt for mother.  Her mother is at [facility] and just dx with lung cancer.  Gave pt an appt time and place.  She verbalized understanding of appt.  She will bring pathology and scans when she comes for appt 08/26/12 at 1:30 labs and 2:00 Dr. Arbutus Ped.  Scheduling aware of new pt appt

## 2012-08-21 ENCOUNTER — Encounter: Payer: Self-pay | Admitting: *Deleted

## 2012-08-21 NOTE — Progress Notes (Signed)
Spoke with pt daughter regarding appt time and place.  MTOC 08/27/12 at 3:45.  Pt verbalized understanding of appt and aware of cancellation of 08/26/12 appt.

## 2012-08-24 ENCOUNTER — Telehealth: Payer: Self-pay | Admitting: *Deleted

## 2012-08-24 NOTE — Telephone Encounter (Signed)
Spoke with Truddie Hidden, pt daughter regarding appt.  Pt will be seen at [facility] 08/27/12 with Dr. Laneta Simmers.  Her appt for Wednesday has been cancelled due to no tissue dx. Daughter verbalizes understanding of appt time and place

## 2012-08-26 ENCOUNTER — Ambulatory Visit: Payer: Medicare Other | Admitting: Internal Medicine

## 2012-08-26 ENCOUNTER — Other Ambulatory Visit: Payer: Medicare Other

## 2012-08-26 ENCOUNTER — Encounter: Payer: Self-pay | Admitting: *Deleted

## 2012-08-26 ENCOUNTER — Ambulatory Visit: Payer: Medicare Other

## 2012-08-27 ENCOUNTER — Institutional Professional Consult (permissible substitution) (INDEPENDENT_AMBULATORY_CARE_PROVIDER_SITE_OTHER): Payer: Medicare Other | Admitting: Surgery

## 2012-08-27 ENCOUNTER — Encounter: Payer: Self-pay | Admitting: Surgery

## 2012-08-27 VITALS — BP 128/62 | HR 71 | Resp 20 | Ht 71.0 in | Wt 185.0 lb

## 2012-08-27 DIAGNOSIS — R222 Localized swelling, mass and lump, trunk: Secondary | ICD-10-CM

## 2012-08-27 DIAGNOSIS — R918 Other nonspecific abnormal finding of lung field: Secondary | ICD-10-CM

## 2012-08-28 ENCOUNTER — Encounter: Payer: Self-pay | Admitting: Surgery

## 2012-08-28 ENCOUNTER — Other Ambulatory Visit: Payer: Self-pay

## 2012-08-28 DIAGNOSIS — R42 Dizziness and giddiness: Secondary | ICD-10-CM

## 2012-08-28 NOTE — Progress Notes (Signed)
[address]       [place] [address]             [phone]      PCP is Salli Real, MD Referring Provider is Bethanie Dicker, MD  Chief Complaint  Patient presents with  . Lung Mass    surgical eval on lung mass, PET Scan 08/24/12,     HPI:  The patient is a 79 year old woman with a history of previous smoking and COPD who presented with a history of right-sided anterior chest pain as well as a 10 pound weight loss over 3 months. Chest x-ray showed a left lower lobe lung mass. CT scan of the chest on 08/12/2012 showed a 4.1 x 3.6 x 4.2 cm lobular mass in the left lower lobe which was new compared to prior chest x-ray one year ago. There were minimal pre-aortic and retrocaval lymph nodes. She underwent a PET scan on 08/20/2012 which showed the left lower lobe lung mass to be significantly hypermetabolic with a maximum SUV of 22.2. There is mild uptake within the left hilum which appeared thickened probably due to lymphadenopathy. There are mildly enlarged precarinal lymph nodes with no significant uptake. There is no evidence of distant metastatic disease.  Past Medical History  Diagnosis Date  . Hypertension   . Colitis   . Coronary artery disease   . High cholesterol     takes Red Yeast Rice and Fish OIl daily  . Kidney stones   . PONV (postoperative nausea and vomiting)   . MI (myocardial infarction) 2010  . Shortness of breath     with exertion  . Pneumonia     hx of 20+yrs ago  . COPD (chronic obstructive pulmonary disease)     pleurisy or COPD exascerbation > 60yr ago  . Headache   . Vertigo     HTN related bc gets up too fast  . Arthritis   . Bruises easily   . History of shingles 56yrs ago    in eye;occasionally gets some residual   . Accessory skin tags     arms/legs  . GERD (gastroesophageal reflux disease)   . Colitis   . Diverticulosis   . Urinary frequency   . Stress incontinence   . History of kidney stones   . Hypothyroidism    takes sYnthroid daily  . Cataract     early stage on right  . Macular degeneration, wet   . Depression     some but takes Amitryptylline nightly    Past Surgical History  Procedure Laterality Date  . Hip arthroscopy  30yr ago    right hip-replacement  . Knee arthroscopy      LT  . Total knee arthroplasty  3yrs ago    left  . Appendectomy  96yrs ago  . Abdominal hysterectomy  16yrs ago   . Lithotripsy  2011  . Cataract surgery  2012    left  . Eye surgery  59yrs ago  . Bladder surgery  2001    tacked  . Coronary angioplasty with stent placement  2010    2 in rt coronary artery and 1 in another spot  . Colonoscopy    . Esophagogastroduodenoscopy    . Total knee arthroplasty  09/09/2011    Procedure: TOTAL KNEE ARTHROPLASTY;  Surgeon: Raymon Mutton, MD;  Location: [facility] OR;  Service: Orthopedics;  Laterality: Right;  Right Total Knee Arthroplasty    Family History  Problem Relation Age  of Onset  . Anesthesia problems Mother   . Hypotension Neg Hx   . Malignant hyperthermia Neg Hx   . Pseudochol deficiency Neg Hx     Social History History  Substance Use Topics  . Smoking status: Former Smoker    Types: Cigarettes    Quit date: 08/26/2001  . Smokeless tobacco: Not on file     Comment: quit several yrs ago  . Alcohol Use: Yes     Comment: 2 times week alcohol    Current Outpatient Prescriptions  Medication Sig Dispense Refill  . albuterol (PROVENTIL HFA;VENTOLIN HFA) 108 (90 BASE) MCG/ACT inhaler Inhale 2 puffs into the lungs every 6 (six) hours as needed for wheezing.      Marland Kitchen amitriptyline (ELAVIL) 25 MG tablet Take 25 mg by mouth at bedtime.      Marland Kitchen amLODipine (NORVASC) 5 MG tablet Take 2.5 mg by mouth daily.       . carvedilol (COREG) 6.25 MG tablet Take 3.125 mg by mouth 2 (two) times daily with a meal.       . celecoxib (CELEBREX) 200 MG capsule Take 200 mg by mouth daily.      . Cyanocobalamin (VITAMIN B-12 IJ) Inject 1,000 mcg/mL as directed every 30 (thirty)  days.      . cycloSPORINE (RESTASIS) 0.05 % ophthalmic emulsion Place 1 drop into both eyes daily.      Marland Kitchen donepezil (ARICEPT) 10 MG tablet Take 10 mg by mouth at bedtime.      . enoxaparin (LOVENOX) 40 MG/0.4ML SOLN Inject 0.4 mLs (40 mg total) into the skin daily.  12 Syringe  0  . Fluticasone-Salmeterol (ADVAIR) 250-50 MCG/DOSE AEPB Inhale 1 puff into the lungs daily.      Marland Kitchen levothyroxine (SYNTHROID, LEVOTHROID) 112 MCG tablet Take 112 mcg by mouth daily.      . mesalamine (LIALDA) 1.2 G EC tablet Take 1,200 mg by mouth daily with breakfast.       . nitroGLYCERIN (NITROSTAT) 0.4 MG SL tablet Place 0.4 mg under the tongue every 5 (five) minutes as needed. For chest pain      . pantoprazole (PROTONIX) 40 MG tablet Take 40 mg by mouth daily.      . ramipril (ALTACE) 10 MG capsule Take 10 mg by mouth daily.      . Red Yeast Rice 600 MG CAPS Take 2 capsules by mouth at bedtime.      Marland Kitchen trimethoprim-polymyxin b (POLYTRIM) ophthalmic solution Place 1 drop into the left eye every 6 (six) hours as needed. Irritation due to macular degeneration       No current facility-administered medications for this visit.    Allergies  Allergen Reactions  . Codeine Hives    Review of Systems  Constitutional: Positive for appetite change and unexpected weight change. Negative for fever, activity change and fatigue.  HENT: Negative.   Respiratory: Positive for cough and shortness of breath.        Chronic dyspnea with exertion such as walking  Cardiovascular: Positive for chest pain. Negative for palpitations and leg swelling.  Gastrointestinal: Negative.   Endocrine: Negative.   Genitourinary: Positive for frequency.  Allergic/Immunologic: Negative.   Neurological:       Mild dementia  Hematological: Negative.   Psychiatric/Behavioral: Negative.     BP 128/62  Pulse 71  Resp 20  Ht 5\' 11"  (1.803 m)  Wt 185 lb (83.915 kg)  BMI 25.81 kg/m2  SpO2 96% Physical Exam  Constitutional: She is  oriented  to person, place, and time. She appears well-developed and well-nourished. No distress.  HENT:  Head: Normocephalic and atraumatic.  Mouth/Throat: Oropharynx is clear and moist.  Eyes: Conjunctivae and EOM are normal. Pupils are equal, round, and reactive to light.  Neck: Normal range of motion. Neck supple. No JVD present. No tracheal deviation present. No thyromegaly present.  Cardiovascular: Normal rate, regular rhythm, normal heart sounds and intact distal pulses.  Exam reveals no gallop and no friction rub.   No murmur heard. Pulmonary/Chest: Effort normal. No respiratory distress. She has no wheezes. She has no rales. She exhibits no tenderness.  Decreased breath sounds throughout consistent with COPD.  Abdominal: Soft. Bowel sounds are normal. She exhibits no distension and no mass. There is no tenderness.  Musculoskeletal: Normal range of motion. She exhibits no edema and no tenderness.  Lymphadenopathy:    She has no cervical adenopathy.  Neurological: She is alert and oriented to person, place, and time. She has normal strength. No cranial nerve deficit or sensory deficit.  Skin: Skin is warm and dry.  Psychiatric: She has a normal mood and affect.     Diagnostic Tests:  CT and PET done at [facility] reviewed by me.  PFT's show FEV1 of 1.4 or 52% predicted.  FVC 2.1 or 60% predicted. Corrected diffusion capacity 60% of predicted.   Impression:  The patient is a 79 year old woman with significant COPD by pulmonary function testing as well as coronary artery disease status post stenting of the right coronary artery several years ago who has a left lower lobe lung mass this most likely bronchogenic carcinoma. There is also some left hilar lymphadenopathy that is mildly hypermetabolic on PET scan and may represent lymph node metastasis. She said that she gets short of breath with moderate activity such as walking around shopping and has to sit down and rest. I think she  would be at high risk for complications following left lower lobectomy and would certainly be more limited in her activities than she is now. She said that she is not really interested in surgery because she is also concerned about the risk. She had a bronchoscopy to try to obtain a tissue diagnosis but all the specimens were negative for malignancy. I think a CT -guided needle biopsy would be the best way to make a diagnosis of cancer in this patient. She will also need an MRI of the brain to complete her workup. I don't think biopsy of her left hilar lymph nodes is necessary unless we were considering lobectomy. I reviewed the CT and PET scan with the patient and her daughter who is a nurse in the neuro ICU and all their questions were answered.  Plan:  1. Schedule CT-guided biopsy of the left lower lobe lung mass. 2. MRI of the brain. 3. I will see her back in the office after these had been completed to discuss the results and decide on a course of treatment.

## 2012-08-31 ENCOUNTER — Telehealth: Payer: Self-pay | Admitting: *Deleted

## 2012-08-31 NOTE — Telephone Encounter (Signed)
Called to follow up from [facility]

## 2012-09-03 ENCOUNTER — Other Ambulatory Visit: Payer: Self-pay | Admitting: Radiology

## 2012-09-07 ENCOUNTER — Ambulatory Visit (HOSPITAL_COMMUNITY)
Admission: RE | Admit: 2012-09-07 | Discharge: 2012-09-07 | Disposition: A | Discharge status: Home / Self Care | Payer: Medicare Other | Source: Ambulatory Visit | Attending: Surgery | Admitting: Surgery

## 2012-09-07 ENCOUNTER — Encounter (HOSPITAL_COMMUNITY): Payer: Self-pay

## 2012-09-07 ENCOUNTER — Ambulatory Visit (HOSPITAL_COMMUNITY)
Admission: RE | Admit: 2012-09-07 | Discharge: 2012-09-07 | Disposition: A | Discharge status: Home / Self Care | Payer: Medicare Other | Source: Ambulatory Visit | Attending: Interventional Radiology | Admitting: Interventional Radiology

## 2012-09-07 ENCOUNTER — Other Ambulatory Visit (HOSPITAL_COMMUNITY): Payer: Self-pay | Admitting: Interventional Radiology

## 2012-09-07 DIAGNOSIS — D381 Neoplasm of uncertain behavior of trachea, bronchus and lung: Secondary | ICD-10-CM

## 2012-09-07 DIAGNOSIS — I700 Atherosclerosis of aorta: Secondary | ICD-10-CM | POA: Insufficient documentation

## 2012-09-07 DIAGNOSIS — Z9889 Other specified postprocedural states: Secondary | ICD-10-CM

## 2012-09-07 DIAGNOSIS — R42 Dizziness and giddiness: Secondary | ICD-10-CM | POA: Insufficient documentation

## 2012-09-07 DIAGNOSIS — G319 Degenerative disease of nervous system, unspecified: Secondary | ICD-10-CM | POA: Insufficient documentation

## 2012-09-07 DIAGNOSIS — C3490 Malignant neoplasm of unspecified part of unspecified bronchus or lung: Secondary | ICD-10-CM | POA: Diagnosis present

## 2012-09-07 DIAGNOSIS — R222 Localized swelling, mass and lump, trunk: Secondary | ICD-10-CM | POA: Insufficient documentation

## 2012-09-07 DIAGNOSIS — R51 Headache: Secondary | ICD-10-CM | POA: Insufficient documentation

## 2012-09-07 HISTORY — PX: OTHER SURGICAL HISTORY: SHX169

## 2012-09-07 LAB — CBC
Hemoglobin: 14.1 g/dL (ref 12.0–15.0)
MCH: 29.7 pg (ref 26.0–34.0)
RBC: 4.75 MIL/uL (ref 3.87–5.11)

## 2012-09-07 LAB — PROTIME-INR: Prothrombin Time: 13.1 seconds (ref 11.6–15.2)

## 2012-09-07 LAB — CREATININE, SERUM: GFR calc non Af Amer: 55 mL/min — ABNORMAL LOW (ref >90)

## 2012-09-07 MED ORDER — MIDAZOLAM HCL 2 MG/2ML IJ SOLN
INTRAMUSCULAR | Status: AC
  Filled 2012-09-07: qty 4

## 2012-09-07 MED ORDER — LORAZEPAM 2 MG/ML IJ SOLN
INTRAMUSCULAR | Status: AC | PRN
  Administered 2012-09-07: 1 mg via INTRAVENOUS

## 2012-09-07 MED ORDER — FENTANYL CITRATE 0.05 MG/ML IJ SOLN
INTRAMUSCULAR | Status: AC | PRN
  Administered 2012-09-07: 50 ug via INTRAVENOUS

## 2012-09-07 MED ORDER — ONDANSETRON HCL 4 MG/2ML IJ SOLN
INTRAMUSCULAR | Status: AC
  Filled 2012-09-07: qty 2

## 2012-09-07 MED ORDER — ONDANSETRON HCL 4 MG/2ML IJ SOLN
4.0000 mg | Freq: Once | INTRAMUSCULAR | Status: AC
  Administered 2012-09-07: 4 mg via INTRAVENOUS

## 2012-09-07 MED ORDER — FENTANYL CITRATE 0.05 MG/ML IJ SOLN
INTRAMUSCULAR | Status: AC
  Filled 2012-09-07: qty 4

## 2012-09-07 MED ORDER — GADOBENATE DIMEGLUMINE 529 MG/ML IV SOLN
18.0000 mL | Freq: Once | INTRAVENOUS | Status: AC | PRN
  Administered 2012-09-07: 18 mL via INTRAVENOUS

## 2012-09-07 NOTE — Procedures (Signed)
Successful LLL MASS CORE BX No comp Stable Full report in PACS

## 2012-09-07 NOTE — H&P (Signed)
Chief Complaint: "I'm here for a lung biopsy" Referring Physician:Bartle HPI: Jodi Frazier is an 79 y.o. female with newly found left lung mass. She is referred for CT guided bx to obtain tissue diagnosis. She also had MRI earlier earlier today. PMHx and meds reviewed.  Past Medical History:  Past Medical History  Diagnosis Date  . Hypertension   . Colitis   . Coronary artery disease   . High cholesterol     takes Red Yeast Rice and Fish OIl daily  . Kidney stones   . PONV (postoperative nausea and vomiting)   . MI (myocardial infarction) 2010  . Shortness of breath     with exertion  . Pneumonia     hx of 20+yrs ago  . COPD (chronic obstructive pulmonary disease)     pleurisy or COPD exascerbation > 1yr ago  . Headache   . Vertigo     HTN related bc gets up too fast  . Arthritis   . Bruises easily   . History of shingles 36yrs ago    in eye;occasionally gets some residual   . Accessory skin tags     arms/legs  . GERD (gastroesophageal reflux disease)   . Colitis   . Diverticulosis   . Urinary frequency   . Stress incontinence   . History of kidney stones   . Hypothyroidism     takes sYnthroid daily  . Cataract     early stage on right  . Macular degeneration, wet   . Depression     some but takes Amitryptylline nightly    Past Surgical History:  Past Surgical History  Procedure Laterality Date  . Hip arthroscopy  33yr ago    right hip-replacement  . Knee arthroscopy      LT  . Total knee arthroplasty  31yrs ago    left  . Appendectomy  39yrs ago  . Abdominal hysterectomy  48yrs ago   . Lithotripsy  2011  . Cataract surgery  2012    left  . Eye surgery  54yrs ago  . Bladder surgery  2001    tacked  . Coronary angioplasty with stent placement  2010    2 in rt coronary artery and 1 in another spot  . Colonoscopy    . Esophagogastroduodenoscopy    . Total knee arthroplasty  09/09/2011    Procedure: TOTAL KNEE ARTHROPLASTY;  Surgeon: Raymon Mutton, MD;  Location: [facility] OR;  Service: Orthopedics;  Laterality: Right;  Right Total Knee Arthroplasty    Family History:  Family History  Problem Relation Age of Onset  . Anesthesia problems Mother   . Hypotension Neg Hx   . Malignant hyperthermia Neg Hx   . Pseudochol deficiency Neg Hx     Social History:  reports that she quit smoking about 11 years ago. Her smoking use included Cigarettes. She smoked 0.00 packs per day. She does not have any smokeless tobacco history on file. She reports that  drinks alcohol. She reports that she does not use illicit drugs.  Allergies:  Allergies  Allergen Reactions  . Codeine Hives    Medications: amitriptyline (ELAVIL) 25 MG tablet Sig - Route: Take 25 mg by mouth at bedtime. - Oral Class: Historical Med Number of times this order has been changed since signing: 1 [address] amLODipine (NORVASC) 5 MG tablet Sig - Route: Take 2.5 mg by mouth daily. - Oral Class: Historical Med Number of times this order has been changed  since signing: 2 [address] carvedilol (COREG) 6.25 MG tablet Sig - Route: Take 3.125 mg by mouth 2 (two) times daily with a meal. - Oral Class: Historical Med Number of times this order has been changed since signing: 2 Order [address] Cyanocobalamin (VITAMIN B-12 IJ) Sig - Route: Inject 1,000 mcg/mL as directed every 30 (thirty) days. - Injection Class: Historical Med Number of times this order has been changed since signing: 1 Order [address] cycloSPORINE (RESTASIS) 0.05 % ophthalmic emulsion Sig - Route: Place 1 drop into both eyes daily. - Both Eyes Class: Historical Med Number of times this order has been changed since signing: 1 Order [address] donepezil (ARICEPT) 10 MG tablet Sig - Route: Take 10 mg by mouth at bedtime. - Oral Class: Historical Med Number of times this order has been changed since signing: Print Fluticasone-Salmeterol (ADVAIR) 250-50 MCG/DOSE AEPB Sig - Route: Inhale 1 puff into the lungs daily. -  Inhalation Class: Historical Med Number of times this order has been changed since signing: 1 Order [address] levothyroxine (SYNTHROID, LEVOTHROID) 112 MCG tablet Sig - Route: Take 112 mcg by mouth daily. - Oral Class: Historical Med Number of times this order has been changed since signing: 1 Order [address] mesalamine (LIALDA) 1.2 G EC tablet Sig - Route: Take 1,200 mg by mouth daily with breakfast. - Oral Class: Historical Med Number of times this order has been changed since signing: 2 Order [address] nitroGLYCERIN (NITROSTAT) 0.4 MG SL tablet Sig - Route: Place 0.4 mg under the tongue every 5 (five) minutes as needed. For chest pain - Sublingual Class: Historical Med Number of times this order has been changed since signing: 1 Order [address] pantoprazole (PROTONIX) 40 MG tablet Sig - Route: Take 40 mg by mouth daily. - Oral Class: Historical Med Number of times this order has been changed since signing: 1 Order [address] ramipril (ALTACE) 10 MG capsule Sig - Route: Take 10 mg by mouth daily. - Oral Class: Historical Med Number of times this order has been changed since signing: 1 Order [address] Red Yeast Rice 600 MG CAPS Sig - Route: Take 2 capsules by mouth at bedtime. - Oral Class: Historical Med Number of times this order has been changed since signing: 1 Order [address] trimethoprim-polymyxin b (POLYTRIM) ophthalmic solution   Please HPI for pertinent positives, otherwise complete 10 system ROS negative.  Physical Exam: Blood pressure 136/59, pulse 64, temperature 98.5 F (36.9 C), temperature source Axillary, resp. rate 14, SpO2 95.00%. There is no weight on file to calculate BMI.   General Appearance:  Alert, cooperative, no distress, appears stated age  Head:  Normocephalic, without obvious abnormality, atraumatic  ENT: Unremarkable  Neck: Supple, symmetrical, trachea midline, no adenopathy, thyroid: not enlarged, symmetric, no tenderness/mass/nodules  Lungs:   Clear to  auscultation bilaterally, no w/r/r, respirations unlabored without use of accessory muscles.  Chest Wall:  No tenderness or deformity  Heart:  Regular rate and rhythm, S1, S2 normal, no murmur, rub or gallop. Carotids 2+ without bruit.  Neurologic: Normal affect, no gross deficits.   Results for orders placed during the hospital encounter of 09/07/12 (from the past 48 hour(s))  CREATININE, SERUM     Status: Abnormal   Collection Time    09/07/12  8:12 AM      Result Value Range   Creatinine, Ser 0.96  0.50 - 1.10 mg/dL   GFR calc non Af Amer 55 (*) >90 mL/min  GFR calc Af Amer 64 (*) >90 mL/min   Comment:            The eGFR has been calculated     using the CKD EPI equation.     This calculation has not been     validated in all clinical     situations.     eGFR's persistently     <90 mL/min signify     possible Chronic Kidney Disease.  APTT     Status: None   Collection Time    09/07/12  8:12 AM      Result Value Range   aPTT 31  24 - 37 seconds  CBC     Status: Abnormal   Collection Time    09/07/12  8:12 AM      Result Value Range   WBC 10.6 (*) 4.0 - 10.5 K/uL   RBC 4.75  3.87 - 5.11 MIL/uL   Hemoglobin 14.1  12.0 - 15.0 g/dL   HCT 09.8  11.9 - 14.7 %   MCV 88.8  78.0 - 100.0 fL   MCH 29.7  26.0 - 34.0 pg   MCHC 33.4  30.0 - 36.0 g/dL   RDW 82.9  56.2 - 13.0 %   Platelets 383  150 - 400 K/uL  PROTIME-INR     Status: None   Collection Time    09/07/12  8:12 AM      Result Value Range   Prothrombin Time 13.1  11.6 - 15.2 seconds   INR 1.00  0.00 - 1.49   No results found.  Assessment/Plan Left lung mass For CT guided biopsy with sedation. Discussed procedure, risks, complications, including bleeding, PTX, need for chest tube, need for admission. Labs reviewed. Consent signed in chart  Brayton El PA-C 09/07/2012, 10:34 AM

## 2012-09-10 ENCOUNTER — Other Ambulatory Visit: Payer: Self-pay | Admitting: *Deleted

## 2012-09-10 DIAGNOSIS — R918 Other nonspecific abnormal finding of lung field: Secondary | ICD-10-CM

## 2012-09-11 ENCOUNTER — Telehealth: Payer: Self-pay | Admitting: *Deleted

## 2012-09-11 NOTE — Telephone Encounter (Signed)
Spoke with pt regarding appt for 09/18/12 at 8:30.  She verbalized understanding.

## 2012-09-11 NOTE — Telephone Encounter (Signed)
Left voice mail regarding appt time 09/18/12 at 8:30 arrival time

## 2012-09-17 ENCOUNTER — Encounter: Payer: Self-pay | Admitting: Radiation Oncology

## 2012-09-18 ENCOUNTER — Ambulatory Visit
Admission: RE | Admit: 2012-09-18 | Discharge: 2012-09-18 | Disposition: A | Discharge status: Home / Self Care | Payer: Medicare Other | Source: Ambulatory Visit | Attending: Radiation Oncology | Admitting: Radiation Oncology

## 2012-09-18 ENCOUNTER — Encounter: Payer: Self-pay | Admitting: Radiation Oncology

## 2012-09-18 DIAGNOSIS — I252 Old myocardial infarction: Secondary | ICD-10-CM | POA: Insufficient documentation

## 2012-09-18 DIAGNOSIS — I10 Essential (primary) hypertension: Secondary | ICD-10-CM | POA: Insufficient documentation

## 2012-09-18 DIAGNOSIS — C3432 Malignant neoplasm of lower lobe, left bronchus or lung: Secondary | ICD-10-CM | POA: Insufficient documentation

## 2012-09-18 DIAGNOSIS — I2510 Atherosclerotic heart disease of native coronary artery without angina pectoris: Secondary | ICD-10-CM | POA: Insufficient documentation

## 2012-09-18 DIAGNOSIS — E039 Hypothyroidism, unspecified: Secondary | ICD-10-CM | POA: Insufficient documentation

## 2012-09-18 DIAGNOSIS — C3492 Malignant neoplasm of unspecified part of left bronchus or lung: Secondary | ICD-10-CM

## 2012-09-18 DIAGNOSIS — Z79899 Other long term (current) drug therapy: Secondary | ICD-10-CM | POA: Insufficient documentation

## 2012-09-18 DIAGNOSIS — E7800 Pure hypercholesterolemia, unspecified: Secondary | ICD-10-CM | POA: Insufficient documentation

## 2012-09-18 DIAGNOSIS — C3430 Malignant neoplasm of lower lobe, unspecified bronchus or lung: Secondary | ICD-10-CM | POA: Insufficient documentation

## 2012-09-18 DIAGNOSIS — K219 Gastro-esophageal reflux disease without esophagitis: Secondary | ICD-10-CM | POA: Insufficient documentation

## 2012-09-18 HISTORY — DX: Malignant neoplasm of unspecified part of unspecified bronchus or lung: C34.90

## 2012-09-18 HISTORY — DX: Other nonspecific abnormal finding of lung field: R91.8

## 2012-09-18 HISTORY — DX: Other chest pain: R07.89

## 2012-09-18 HISTORY — DX: Abnormal weight loss: R63.4

## 2012-09-18 NOTE — Progress Notes (Addendum)
Radiation Oncology         [phone] ________________________________  Initial outpatient Consultation  Name: BINTOU LAFATA MRN: [id number]  Date: 09/18/2012  DOB: October 03, 1930  CC:SUN,YUN, MD  Alleen Borne, MD   REFERRING PHYSICIAN: Alleen Borne, MD  DIAGNOSIS: Left lower lung poorly differentiated adenocarcinoma, [id number] STAGE IIA  HISTORY OF PRESENT ILLNESS::Marvia A Cassells is a 79 y.o. female who devloped right upper chest pain and saw  Pulmonologist for this. Chest xray incidentally revealed a left lower lobe mass.  She underwent a CT scan of her chest  In [facility] (I do not have this report)  Followed by a PET CT on 08/20/12.  I reviewed these images on a CD.  There is a SUV 23.2 left lower lobe mass that is 4.7  X 3.4 cm transversely with adjacent hilar (left) SUV uptake of 3.57.  A little fullness in this area - nodes were reported to be somewhat suspicious on CT - overall left hilar findings are most consistent with malignancy..  Subcarinal nodes appear reactive and not felt to be cancerous. No metastatic disease elsewhere.  She underwent biopsy of the LLL tumor on 3/10 - positive for Left lower lung poorly differentiated adenocarcinoma.  She is otherwise in her usual state of health. She can perform water aerobics with minimal SOB in spite of her COPD.  Has had a prior MI, but cardiac health "good" per patient's parenthetical report from cardiology recently. She has no dysphagia or hemoptysis. An MRI of her brain for staging was negative for metastases.  She lost 5 pounds in the past 2 months.   PREVIOUS RADIATION THERAPY: No  PAST MEDICAL HISTORY:  has a past medical history of Hypertension; Colitis; Coronary artery disease; High cholesterol; Kidney stones; PONV (postoperative nausea and vomiting); MI (myocardial infarction) (2010); Shortness of breath; Pneumonia; COPD (chronic obstructive pulmonary disease); Headache; Vertigo; Arthritis; Bruises easily; History of  shingles (90yrs ago); Accessory skin tags; GERD (gastroesophageal reflux disease); Colitis; Diverticulosis; Urinary frequency; Stress incontinence; History of kidney stones; Hypothyroidism; Cataract; Macular degeneration, wet; Depression; Right-sided chest wall pain (08/28/12); Weight loss (08/28/12); Lung mass (08/12/12); Adenocarcinoma of lung (09/07/12); and Dementia.    PAST SURGICAL HISTORY: Past Surgical History  Procedure Laterality Date  . Hip arthroscopy  55yr ago    right hip-replacement  . Knee arthroscopy      LT  . Total knee arthroplasty  68yrs ago    left  . Appendectomy  20yrs ago  . Abdominal hysterectomy  64yrs ago   . Lithotripsy  2011  . Cataract surgery  2012    left  . Eye surgery  59yrs ago  . Bladder surgery  2001    tacked  . Coronary angioplasty with stent placement  2010    2 in rt coronary artery and 1 in another spot  . Colonoscopy    . Esophagogastroduodenoscopy    . Total knee arthroplasty  09/09/2011    Procedure: TOTAL KNEE ARTHROPLASTY;  Surgeon: Raymon Mutton, MD;  Location: [facility] OR;  Service: Orthopedics;  Laterality: Right;  Right Total Knee Arthroplasty  . Bronchial brush biopsy Left 08/17/12    LLL Bronchial Washing / Brushing and Bronchial Biopsy: Negative for malignancy  . Left lower lobe needle core biopsy Left 09/07/12    Poorly Differentiated Adenocarcinoma     FAMILY HISTORY: family history includes Anesthesia problems in her mother and Cancer in her maternal aunt.  There is no history of Hypotension, and Malignant  hyperthermia, and Pseudochol deficiency, .  SOCIAL HISTORY:  reports that she quit smoking about 11 years ago. Her smoking use included Cigarettes. She has a 30 pack-year smoking history. She does not have any smokeless tobacco history on file. She reports that  drinks alcohol. She reports that she does not use illicit drugs.  ALLERGIES: Codeine  MEDICATIONS:  Current Outpatient Prescriptions  Medication Sig Dispense Refill  .  albuterol (PROVENTIL HFA;VENTOLIN HFA) 108 (90 BASE) MCG/ACT inhaler Inhale 2 puffs into the lungs every 6 (six) hours as needed for wheezing.      Marland Kitchen amitriptyline (ELAVIL) 25 MG tablet Take 25 mg by mouth at bedtime.      Marland Kitchen amLODipine (NORVASC) 5 MG tablet Take 2.5 mg by mouth daily.       Marland Kitchen aspirin 325 MG EC tablet Take 325 mg by mouth daily.      . carvedilol (COREG) 6.25 MG tablet Take 3.125 mg by mouth 2 (two) times daily with a meal.       . celecoxib (CELEBREX) 200 MG capsule Take 200 mg by mouth daily.      . Cyanocobalamin (VITAMIN B-12 IJ) Inject 1,000 mcg/mL as directed every 30 (thirty) days.      . cycloSPORINE (RESTASIS) 0.05 % ophthalmic emulsion Place 1 drop into both eyes daily.      Marland Kitchen donepezil (ARICEPT) 10 MG tablet Take 10 mg by mouth at bedtime.      Marland Kitchen ibuprofen (ADVIL,MOTRIN) 800 MG tablet Take 800 mg by mouth every 8 (eight) hours as needed for pain.      Marland Kitchen levothyroxine (SYNTHROID, LEVOTHROID) 112 MCG tablet Take 112 mcg by mouth daily.      . mesalamine (LIALDA) 1.2 G EC tablet Take 1,200 mg by mouth daily with breakfast.       . nitroGLYCERIN (NITROSTAT) 0.4 MG SL tablet Place 0.4 mg under the tongue every 5 (five) minutes as needed. For chest pain      . pantoprazole (PROTONIX) 40 MG tablet Take 40 mg by mouth daily.      . ramipril (ALTACE) 10 MG capsule Take 10 mg by mouth daily.      . Red Yeast Rice 600 MG CAPS Take 2 capsules by mouth at bedtime.       No current facility-administered medications for this encounter.    REVIEW OF SYSTEMS:  As above   PHYSICAL EXAM:  weight is 189 lb 8 oz (85.957 kg). Her temperature is 97.7 F (36.5 C). Her blood pressure is 148/58 and her pulse is 62. Her respiration is 20 and oxygen saturation is 97%.   General: Alert and oriented, in no acute distress HEENT: Head is normocephalic. Right pupil smaller than left, both minimally responsive to light. Extraocular movements are intact. Oropharynx is clear. Neck: Neck is supple,  no palpable cervical or supraclavicular lymphadenopathy. Heart: Regular in rate and rhythm with no murmurs, rubs, or gallops. Chest: Decent airflow bilaterally, with no obvious rhonchi, wheezes, or rales. Abdomen: Soft, nontender, nondistended, with no rigidity or guarding. Extremities: No cyanosis or edema. Lymphatics: No concerning lymphadenopathy. Skin: No concerning lesions. Musculoskeletal: symmetric strength and muscle tone throughout. Neurologic: Cranial nerves II through XII are grossly intact. No obvious focalities. Speech is fluent. Coordination is intact. Psychiatric: Judgment and insight are intact. Affect is appropriate.    LABORATORY DATA:  Lab Results  Component Value Date   WBC 10.6* 09/07/2012   HGB 14.1 09/07/2012   HCT 42.2 09/07/2012   MCV  88.8 09/07/2012   PLT 383 09/07/2012   CMP     Component Value Date/Time   NA 139 09/11/2011 0500   K 4.1 09/11/2011 0500   CL 108 09/11/2011 0500   CO2 26 09/11/2011 0500   GLUCOSE 97 09/11/2011 0500   BUN 19 09/11/2011 0500   CREATININE 0.96 09/07/2012 0812   CALCIUM 8.8 09/11/2011 0500   PROT 7.6 08/29/2011 1314   ALBUMIN 3.8 08/29/2011 1314   AST 21 08/29/2011 1314   ALT 21 08/29/2011 1314   ALKPHOS 119* 08/29/2011 1314   BILITOT 0.3 08/29/2011 1314   GFRNONAA 55* 09/07/2012 0812   GFRAA 64* 09/07/2012 0812         RADIOGRAPHY: Mr Laqueta Jean Wo Contrast  09/07/2012  *RADIOLOGY REPORT*  Clinical Data: New lung mass.  Headaches and dizziness.  MRI HEAD WITHOUT AND WITH CONTRAST  Technique:  Multiplanar, multiecho pulse sequences of the brain and surrounding structures were obtained according to standard protocol without and with intravenous contrast  Contrast: 18mL MULTIHANCE GADOBENATE DIMEGLUMINE 529 MG/ML IV SOLN  Comparison:  None.  Findings:  No acute infarct.  No intracranial hemorrhage.  No intracranial enhancing lesion or bony destructive lesion to suggest presence of intracranial metastatic disease.  Very mild small vessel  disease type changes.  Global atrophy without hydrocephalus.  Prominent subarachnoid spaces within the convexity bilaterally.  Major intracranial vascular structures are patent.  Cervical medullary junction, pituitary region, pineal region and orbital structures unremarkable.  Minimal paranasal sinus mucosal thickening.  IMPRESSION: No acute infarct.  No evidence of intracranial metastatic disease.  Global atrophy without hydrocephalus.   Original Report Authenticated By: Lacy Duverney, M.D.    Ct Biopsy  09/07/2012  *RADIOLOGY REPORT*  Clinical Data: Left lower lobe mass  CT FLUOROSCOPY GUIDED LEFT LOWER LOBE MASS 18 GAUGE CORE BIOPSY  Date:  09/07/2012 11:00:00  Radiologist:  M. Ruel Favors, M.D.  Medications:  1 mg Versed, 50 mcg Fentanyl  Guidance:  CT fluoroscopy  Fluoroscopy time:  9 seconds  Sedation time:  15 minutes  Contrast volume:  None.  Complications:  No immediate  PROCEDURE/FINDINGS:  Informed consent was obtained from the patient following explanation of the procedure, risks, benefits and alternatives. The patient understands, agrees and consents for the procedure. All questions were addressed.  A time out was performed.  Maximal barrier sterile technique utilized including caps, mask, sterile gowns, sterile gloves, large sterile drape, hand hygiene, and betadine  Previous imaging reviewed.  The patient was positioned left side down decubitus.  Noncontrast localization CT performed through the chest.  The left lower lobe mass was localized.  Under sterile conditions and local anesthesia, a 17 gauge 6.8 cm access needle was advanced from a posterior approach into the lung mass.  Needle position confirmed with CT fluoroscopy.  Several 18 gauge core biopsies were obtained and placed in formalin.  Needle removed.  No immediate complication.  No pneumothorax or effusion.  The patient tolerated biopsy well.  IMPRESSION: Successful CT fluoroscopy guided left lower lobe mass 18 gauge core biopsies    Original Report Authenticated By: Judie Petit. Miles Costain, M.D.    Dg Chest Port 1 View  09/07/2012  *RADIOLOGY REPORT*  Clinical Data: Status post lung biopsy.  PORTABLE CHEST - 1 VIEW  Comparison: Chest x-ray 08/29/2011.  Findings: Perihilar mass in the left lower lobe is noted.  No surrounding hemorrhage.  No definite left pneumothorax.  Lungs are otherwise clear.  Mild bilateral apical pleuroparenchymal scarring is unchanged.  No evidence of pulmonary edema.  Heart size is normal.  Atherosclerosis in the thoracic aorta.  IMPRESSION: 1.  No evidence of post biopsy pneumothorax or significant post biopsy hemorrhage. 2.  Large perihilar mass in the left lower lobe redemonstrated. 3.  Atherosclerosis.   Original Report Authenticated By: Trudie Reed, M.D.       IMPRESSION/PLAN:  [age] year old woman with COPD and former smoking history with Stage IIA poorly differentiated adenocarcinoma of the lung.  Although surgery is an option, ChRT is probably more prudent given her comorbidities.  She is reluctant to consistent chemotherapy. But, she will reschedule her med/onc appt. to hear what Dr. Arbutus Ped has to say.  I will recommend she receive 66-70 Gy in 33-35 fractions (66 Gy if chemo concurrently, 70 Gy to be considered without chemotherapy) over about 7 weeks.  We discussed risks/benefits/side effects of this treatment. She understands her chance of cure is low without chemotherapy. Side effects can include fatigue, skin irritation, esophagitis, and late lung/heart/esophageal toxicity. She is enthusiastic to proceed with RT planning - consent signed today, and visit for this scheduled on 09/21/12. We spoke about technologies that will be employed to make treatment effective and decrease side effects.  Of note, patient insists that she wants to start RT ASAP regardless of whether she received chemotherapy. She does not want chemotherapy to delay RT starting, regardless of standards of care.  I spent 45 minutes  minutes face to face with the patient and more than 50% of that time was spent in counseling and/or coordination of care.    __________________________________________   Lonie Peak, MD

## 2012-09-18 NOTE — Progress Notes (Signed)
Pt lives alone, 4 daughters. Daughter w/pt today is neuro ICU RN at [employer]. Pt denies pain, has SOB w/exertion, dry cough, loss of appetite, fatigue. She states her current weight is 10 lbs less than her usual, but she eats because she "knows she needs to".

## 2012-09-18 NOTE — Progress Notes (Signed)
Please see the Nurse Progress Note in the MD Initial Consult Encounter for this patient. 

## 2012-09-18 NOTE — Addendum Note (Signed)
Encounter addended by: Glennie Hawk, RN on: 09/18/2012  1:13 PM<BR>     Documentation filed: Charges VN

## 2012-09-20 NOTE — Addendum Note (Signed)
Encounter addended by: Delynn Flavin, RN on: 09/20/2012  4:26 PM<BR>     Documentation filed: Charges VN

## 2012-09-21 ENCOUNTER — Inpatient Hospital Stay: Admission: RE | Admit: 2012-09-21 | Payer: Self-pay | Source: Ambulatory Visit

## 2012-09-21 ENCOUNTER — Ambulatory Visit: Payer: Medicare Other | Admitting: Radiation Oncology

## 2012-09-25 ENCOUNTER — Encounter: Payer: Self-pay | Admitting: Radiation Oncology

## 2012-09-25 ENCOUNTER — Ambulatory Visit
Admission: RE | Admit: 2012-09-25 | Discharge: 2012-09-25 | Disposition: A | Discharge status: Home / Self Care | Payer: Medicare Other | Source: Ambulatory Visit | Attending: Radiation Oncology | Admitting: Radiation Oncology

## 2012-09-25 DIAGNOSIS — C3490 Malignant neoplasm of unspecified part of unspecified bronchus or lung: Secondary | ICD-10-CM | POA: Insufficient documentation

## 2012-09-25 DIAGNOSIS — Z510 Encounter for antineoplastic radiation therapy: Secondary | ICD-10-CM | POA: Insufficient documentation

## 2012-09-25 MED ORDER — SODIUM CHLORIDE 0.9 % IJ SOLN
10.0000 mL | Freq: Once | INTRAMUSCULAR | Status: AC
  Administered 2012-09-25: 10 mL via INTRAVENOUS

## 2012-09-25 NOTE — Progress Notes (Signed)
IV established at 1030 with any difficulty.  22 gauge angiocath in the left antecubital space.  Blood return noted and flushed without any swelling or pain .

## 2012-09-25 NOTE — Progress Notes (Signed)
  Radiation Oncology         [phone] ________________________________  Name: Jodi Frazier MRN: [id number]  Date: 09/25/2012  DOB: 12-31-1930  SIMULATION AND TREATMENT PLANNING NOTE  outpatient  DIAGNOSIS:  Lung cancer, left lung  NARRATIVE:  The patient was brought to the CT Simulation planning suite.  Identity was confirmed.  All relevant records and images related to the planned course of therapy were reviewed.  The patient freely provided informed written consent to proceed with treatment after reviewing the details related to the planned course of therapy. The consent form was witnessed and verified by the simulation staff.    Then, the patient was set-up in a stable reproducible supine position for radiation therapy.   RESPIRATORY MOTION MANAGEMENT SIMULATION  NARRATIVE:  In order to account for effect of respiratory motion on target structures and other organs in the planning and delivery of radiotherapy, this patient underwent respiratory motion management simulation.  To accomplish this, when the patient was brought to the CT simulation planning suite, 4D respiratoy motion management CT images were obtained.  The CT images were loaded into the planning software.  Then, using a variety of tools including Cine, MIP, and standard views, the target volume and   target volumes (ITV, PTV) were delineated.  Avoidance structures were contoured.  Treatment planning then occurred.   TREATMENT PLANNING NOTE: Treatment planning then occurred.  The radiation prescription was entered and confirmed.    A total of 4 medically necessary complex treatment devices were fabricated and supervised by me. I have requested : 4 fields MLC's.   3D conformal  Radiotherapy will be used to spare the spinal cord, esophagus, normal lung tissue, heart. I have requested a DVH of these structures.  The patient will receive 70 Gy in 35 fractions - she is declining on  chemotherapy.   -----------------------------------  Lonie Peak, MD

## 2012-09-28 ENCOUNTER — Telehealth: Payer: Self-pay | Admitting: Radiation Oncology

## 2012-09-28 NOTE — Telephone Encounter (Signed)
Faxed Jodi Frazier's FMLA papers to [employer], [phone].  Received confirmation.

## 2012-10-02 ENCOUNTER — Ambulatory Visit
Admission: RE | Admit: 2012-10-02 | Discharge: 2012-10-02 | Disposition: A | Discharge status: Home / Self Care | Payer: Medicare Other | Source: Ambulatory Visit | Attending: Radiation Oncology | Admitting: Radiation Oncology

## 2012-10-02 NOTE — Progress Notes (Signed)
Simulation Verification Note Chest Outpatient  The patient was brought to the treatment unit and placed in the planned treatment position. The clinical setup was verified. Then port films were obtained and uploaded to the radiation oncology medical record software.  The treatment beams were carefully compared against the planned radiation fields. The position location and shape of the radiation fields was reviewed. They targeted volume of tissue appears to be appropriately covered by the radiation beams. Organs at risk appear to be excluded as planned.  Based on my personal review, I approved the simulation verification. The patient's treatment will proceed as planned.  -----------------------------------  Veronda Gabor, MD  

## 2012-10-05 ENCOUNTER — Ambulatory Visit
Admission: RE | Admit: 2012-10-05 | Discharge: 2012-10-05 | Disposition: A | Discharge status: Home / Self Care | Payer: Medicare Other | Source: Ambulatory Visit | Attending: Radiation Oncology | Admitting: Radiation Oncology

## 2012-10-05 NOTE — Progress Notes (Signed)
Jodi Frazier here for weekly under treatment visit.  She has had 1 fraction to her left lower lung.  She denies pain.  She doe have fatigue.  She had a stomach virus last week and states that she still is having diarrhea.  She does have shortness of breath with activity.  Her oxygen saturation is 99% today on room air.

## 2012-10-05 NOTE — Progress Notes (Signed)
   Weekly Management Note:  outpatient Current Dose:  2 Gy  Projected Dose: 70 Gy   Narrative:  The patient presents for routine under treatment assessment.  CBCT/MVCT images/Port film x-rays were reviewed.  The chart was checked. No complaints  Physical Findings:  height is 5\' 11"  (1.803 m) and weight is 185 lb (83.915 kg). Her blood pressure is 146/59 and her pulse is 68. Her oxygen saturation is 99%.  Well appearing, no SOB  Impression:  The patient is tolerating radiotherapy.  Plan:  Continue radiotherapy as planned.  ________________________________   Lonie Peak, M.D.

## 2012-10-06 ENCOUNTER — Ambulatory Visit
Admission: RE | Admit: 2012-10-06 | Discharge: 2012-10-06 | Disposition: A | Discharge status: Home / Self Care | Payer: Medicare Other | Source: Ambulatory Visit | Attending: Radiation Oncology | Admitting: Radiation Oncology

## 2012-10-07 ENCOUNTER — Ambulatory Visit
Admission: RE | Admit: 2012-10-07 | Discharge: 2012-10-07 | Disposition: A | Discharge status: Home / Self Care | Payer: Medicare Other | Source: Ambulatory Visit | Attending: Radiation Oncology | Admitting: Radiation Oncology

## 2012-10-07 DIAGNOSIS — C3492 Malignant neoplasm of unspecified part of left bronchus or lung: Secondary | ICD-10-CM

## 2012-10-07 MED ORDER — RADIAPLEXRX EX GEL
Freq: Once | CUTANEOUS | Status: AC
  Administered 2012-10-07: 1 via TOPICAL

## 2012-10-07 NOTE — Progress Notes (Signed)
Ms. Gallentine came to the clinic to have post sim education.  She was given the Radiation Therapy and You booklet and educated on the potential side effects of radiation including fatigue, throat changes and skin changes.  She was also instructed to follow a hight protein diet.  She was also given radiaplex gel and instructed to use it twice a day with the first application 4 hours prior to treatment each day.  She was given a business card with Nursing's phone number and was instructed to contact us with any questions.

## 2012-10-08 ENCOUNTER — Ambulatory Visit
Admission: RE | Admit: 2012-10-08 | Discharge: 2012-10-08 | Disposition: A | Discharge status: Home / Self Care | Payer: Medicare Other | Source: Ambulatory Visit | Attending: Radiation Oncology | Admitting: Radiation Oncology

## 2012-10-09 ENCOUNTER — Ambulatory Visit
Admission: RE | Admit: 2012-10-09 | Discharge: 2012-10-09 | Disposition: A | Discharge status: Home / Self Care | Payer: Medicare Other | Source: Ambulatory Visit | Attending: Radiation Oncology | Admitting: Radiation Oncology

## 2012-10-12 ENCOUNTER — Ambulatory Visit
Admission: RE | Admit: 2012-10-12 | Discharge: 2012-10-12 | Disposition: A | Discharge status: Home / Self Care | Payer: Medicare Other | Source: Ambulatory Visit | Attending: Radiation Oncology | Admitting: Radiation Oncology

## 2012-10-12 NOTE — Progress Notes (Signed)
   Weekly Management Note:  outpatient Current Dose:  12 Gy  Projected Dose: 70 Gy   Narrative:  The patient presents for routine under treatment assessment.  CBCT/MVCT images/Port film x-rays were reviewed.  The chart was checked. Short of Breath. Daughter says this has been going on for weeks. Stopped Advair before this.  No calf pain or Chest pain.  Physical Findings:  height is 5\' 11"  (1.803 m) and weight is 184 lb 9.6 oz (83.734 kg). Her temperature is 97.5 F (36.4 C). Her blood pressure is 155/61 and her pulse is 71. Her oxygen saturation is 100%.  Lungs CTAB  Impression:  The patient is tolerating radiotherapy.  Plan:  Continue radiotherapy as planned. Suggested trying Advair again. I think the suspicion for PE is too low to warrant CT angio.  More likely SOB is  from COPD.  ________________________________   Lonie Peak, M.D.

## 2012-10-12 NOTE — Progress Notes (Signed)
Ms. Flett here for weekly put with her daughter.  She denies pain.  She is fatigued.  She is also short of breath.  Her oxygen saturation was 100% on room air after a few deep breaths.  She states that her skin is intact.  She is using radiaplex gel on her treatment site.  She also is wondering how to get an appointment with Dr. Shirline Frees.  She also has a dry cough especially when lying down.

## 2012-10-13 ENCOUNTER — Ambulatory Visit
Admission: RE | Admit: 2012-10-13 | Discharge: 2012-10-13 | Disposition: A | Discharge status: Home / Self Care | Payer: Medicare Other | Source: Ambulatory Visit | Attending: Radiation Oncology | Admitting: Radiation Oncology

## 2012-10-14 ENCOUNTER — Ambulatory Visit
Admission: RE | Admit: 2012-10-14 | Discharge: 2012-10-14 | Disposition: A | Discharge status: Home / Self Care | Payer: Medicare Other | Source: Ambulatory Visit | Attending: Radiation Oncology | Admitting: Radiation Oncology

## 2012-10-15 ENCOUNTER — Ambulatory Visit
Admission: RE | Admit: 2012-10-15 | Discharge: 2012-10-15 | Disposition: A | Discharge status: Home / Self Care | Payer: Medicare Other | Source: Ambulatory Visit | Attending: Radiation Oncology | Admitting: Radiation Oncology

## 2012-10-16 ENCOUNTER — Ambulatory Visit
Admission: RE | Admit: 2012-10-16 | Discharge: 2012-10-16 | Disposition: A | Discharge status: Home / Self Care | Payer: Medicare Other | Source: Ambulatory Visit | Attending: Radiation Oncology | Admitting: Radiation Oncology

## 2012-10-19 ENCOUNTER — Ambulatory Visit
Admission: RE | Admit: 2012-10-19 | Discharge: 2012-10-19 | Disposition: A | Discharge status: Home / Self Care | Payer: Medicare Other | Source: Ambulatory Visit | Attending: Radiation Oncology | Admitting: Radiation Oncology

## 2012-10-19 ENCOUNTER — Encounter: Payer: Self-pay | Admitting: Radiation Oncology

## 2012-10-19 NOTE — Progress Notes (Signed)
   Weekly Management Note:  Outpatient Current Dose:  22 Gy  Projected Dose: 70 Gy   Narrative:  The patient presents for routine under treatment assessment.  CBCT/MVCT images/Port film x-rays were reviewed.  The chart was checked. Doing well. No acute effects  Physical Findings:  weight is 182 lb 9.6 oz (82.827 kg). Her oral temperature is 97.7 F (36.5 C). Her blood pressure is 132/75 and her pulse is 71. Her respiration is 20 and oxygen saturation is 96%.  No skin irritation in treatment fields.  Impression:  The patient is tolerating radiotherapy.  Plan:  Continue radiotherapy as planned.  ________________________________   Lonie Peak, M.D.

## 2012-10-19 NOTE — Progress Notes (Signed)
patient here weekly rad tx,  11 ll lung completed so far, alert,oriented x3, no pain does have dry cough, good appetite, using radiaplex gel bid font and back of chest,  Drinks protein shakes every other day, no c/o sob today,  No swallowing difficulty  Energy level slight down, sleeping better stated 10:38 AM

## 2012-10-20 ENCOUNTER — Ambulatory Visit
Admission: RE | Admit: 2012-10-20 | Discharge: 2012-10-20 | Disposition: A | Discharge status: Home / Self Care | Payer: Medicare Other | Source: Ambulatory Visit | Attending: Radiation Oncology | Admitting: Radiation Oncology

## 2012-10-21 ENCOUNTER — Ambulatory Visit
Admission: RE | Admit: 2012-10-21 | Discharge: 2012-10-21 | Disposition: A | Discharge status: Home / Self Care | Payer: Medicare Other | Source: Ambulatory Visit | Attending: Radiation Oncology | Admitting: Radiation Oncology

## 2012-10-22 ENCOUNTER — Ambulatory Visit
Admission: RE | Admit: 2012-10-22 | Discharge: 2012-10-22 | Disposition: A | Discharge status: Home / Self Care | Payer: Medicare Other | Source: Ambulatory Visit | Attending: Radiation Oncology | Admitting: Radiation Oncology

## 2012-10-23 ENCOUNTER — Ambulatory Visit
Admission: RE | Admit: 2012-10-23 | Discharge: 2012-10-23 | Disposition: A | Discharge status: Home / Self Care | Payer: Medicare Other | Source: Ambulatory Visit | Attending: Radiation Oncology | Admitting: Radiation Oncology

## 2012-10-26 ENCOUNTER — Encounter: Payer: Self-pay | Admitting: Radiation Oncology

## 2012-10-26 ENCOUNTER — Ambulatory Visit
Admission: RE | Admit: 2012-10-26 | Discharge: 2012-10-26 | Disposition: A | Discharge status: Home / Self Care | Payer: Medicare Other | Source: Ambulatory Visit | Attending: Radiation Oncology | Admitting: Radiation Oncology

## 2012-10-26 ENCOUNTER — Inpatient Hospital Stay
Admission: RE | Admit: 2012-10-26 | Discharge: 2012-10-26 | Disposition: A | Discharge status: Home / Self Care | Payer: Self-pay | Source: Ambulatory Visit | Attending: Radiation Oncology | Admitting: Radiation Oncology

## 2012-10-26 ENCOUNTER — Other Ambulatory Visit: Payer: Self-pay | Admitting: Radiation Oncology

## 2012-10-26 DIAGNOSIS — R918 Other nonspecific abnormal finding of lung field: Secondary | ICD-10-CM

## 2012-10-26 NOTE — Progress Notes (Signed)
Weekly Management Note:  Site: Left lung Current Dose:  3200  cGy Projected Dose: 7000  cGy  Narrative: The patient is seen today for routine under treatment assessment. CBCT/MVCT images/port films were reviewed. The chart was reviewed.   The setup is excellent. She is without complaints today except for periodic fatigue. She does remain quite active.  Physical Examination: There were no vitals filed for this visit.  O2 sat 98%. .  Weight: 184.3 lbs.  Lungs are clear.  Impression: Tolerating radiation therapy well.  Plan: Continue radiation therapy as planned.

## 2012-10-26 NOTE — Progress Notes (Signed)
Ms. Laubach has received 16 fractions to her LLL.  She reports that her breathing and her cough are better. O2 sat 98% on RA.   The stiffness in her neck is resolving and she grades the pain in this area as a level 2 on a scale of 0-10.   She has gained 2 lbs since her last weight.

## 2012-10-27 ENCOUNTER — Ambulatory Visit
Admission: RE | Admit: 2012-10-27 | Discharge: 2012-10-27 | Disposition: A | Discharge status: Home / Self Care | Payer: Medicare Other | Source: Ambulatory Visit | Attending: Radiation Oncology | Admitting: Radiation Oncology

## 2012-10-28 ENCOUNTER — Ambulatory Visit
Admission: RE | Admit: 2012-10-28 | Discharge: 2012-10-28 | Disposition: A | Discharge status: Home / Self Care | Payer: Medicare Other | Source: Ambulatory Visit | Attending: Radiation Oncology | Admitting: Radiation Oncology

## 2012-10-29 ENCOUNTER — Ambulatory Visit
Admission: RE | Admit: 2012-10-29 | Discharge: 2012-10-29 | Disposition: A | Discharge status: Home / Self Care | Payer: Medicare Other | Source: Ambulatory Visit | Attending: Radiation Oncology | Admitting: Radiation Oncology

## 2012-10-30 ENCOUNTER — Ambulatory Visit
Admission: RE | Admit: 2012-10-30 | Discharge: 2012-10-30 | Disposition: A | Discharge status: Home / Self Care | Payer: Medicare Other | Source: Ambulatory Visit | Attending: Radiation Oncology | Admitting: Radiation Oncology

## 2012-11-02 ENCOUNTER — Ambulatory Visit
Admission: RE | Admit: 2012-11-02 | Discharge: 2012-11-02 | Disposition: A | Discharge status: Home / Self Care | Payer: Medicare Other | Source: Ambulatory Visit | Attending: Radiation Oncology | Admitting: Radiation Oncology

## 2012-11-02 ENCOUNTER — Encounter: Payer: Self-pay | Admitting: Radiation Oncology

## 2012-11-02 ENCOUNTER — Ambulatory Visit: Admission: RE | Admit: 2012-11-02 | Payer: Medicare Other | Source: Ambulatory Visit | Admitting: Radiation Oncology

## 2012-11-02 MED ORDER — BENZONATATE 100 MG PO CAPS: 100.0000 mg | ORAL_CAPSULE | Freq: Three times a day (TID) | ORAL | Status: DC | PRN

## 2012-11-02 NOTE — Progress Notes (Signed)
   Weekly Management Note:  outpatient Current Dose:  42 Gy  Projected Dose: 70 Gy   Narrative:  The patient presents for routine under treatment assessment.  CBCT/MVCT images/Port film x-rays were reviewed.  The chart was checked. Doing well. Cough last night, kept her up, better with cough lozenge. No increased SOB.  Physical Findings:  weight is 184 lb 1.6 oz (83.507 kg). Her temperature is 97.4 F (36.3 C). Her blood pressure is 130/55 and her pulse is 72. Her oxygen saturation is 98%.  NAD, no skin irritation over torso   Impression:  The patient is tolerating radiotherapy.  Plan:  Continue radiotherapy as planned. Rx for Tessalon perles given ________________________________   Lonie Peak, M.D.

## 2012-11-02 NOTE — Progress Notes (Signed)
Jodi Frazier has received 21 fractions.  She admits to feeling fatigued and now has dry frequent cough which keeps her up at night and is asking for medication to manage the coughing.  She denies any pain presently.  She is accompanied by a family member today.

## 2012-11-03 ENCOUNTER — Ambulatory Visit
Admission: RE | Admit: 2012-11-03 | Discharge: 2012-11-03 | Disposition: A | Discharge status: Home / Self Care | Payer: Medicare Other | Source: Ambulatory Visit | Attending: Radiation Oncology | Admitting: Radiation Oncology

## 2012-11-04 ENCOUNTER — Ambulatory Visit
Admission: RE | Admit: 2012-11-04 | Discharge: 2012-11-04 | Disposition: A | Discharge status: Home / Self Care | Payer: Medicare Other | Source: Ambulatory Visit | Attending: Radiation Oncology | Admitting: Radiation Oncology

## 2012-11-05 ENCOUNTER — Ambulatory Visit
Admission: RE | Admit: 2012-11-05 | Discharge: 2012-11-05 | Disposition: A | Discharge status: Home / Self Care | Payer: Medicare Other | Source: Ambulatory Visit | Attending: Radiation Oncology | Admitting: Radiation Oncology

## 2012-11-06 ENCOUNTER — Ambulatory Visit
Admission: RE | Admit: 2012-11-06 | Discharge: 2012-11-06 | Disposition: A | Discharge status: Home / Self Care | Payer: Medicare Other | Source: Ambulatory Visit | Attending: Radiation Oncology | Admitting: Radiation Oncology

## 2012-11-07 ENCOUNTER — Ambulatory Visit
Admission: RE | Admit: 2012-11-07 | Discharge: 2012-11-07 | Disposition: A | Discharge status: Home / Self Care | Payer: Medicare Other | Source: Ambulatory Visit | Attending: Radiation Oncology | Admitting: Radiation Oncology

## 2012-11-09 ENCOUNTER — Ambulatory Visit
Admission: RE | Admit: 2012-11-09 | Discharge: 2012-11-09 | Disposition: A | Discharge status: Home / Self Care | Payer: Medicare Other | Source: Ambulatory Visit | Attending: Radiation Oncology | Admitting: Radiation Oncology

## 2012-11-09 ENCOUNTER — Encounter: Payer: Self-pay | Admitting: Radiation Oncology

## 2012-11-09 NOTE — Progress Notes (Signed)
Patient here weekly rad tx lung, 26/35 completed, back slight erythema, eating well, fatigue hit her on Saturday, slept a lot, has been wobbly since stated, hasn't lost any weight,eating well, no c/o pain, wasn't using radiaplex correctly, re-enforced  Use of cream to put on after txs not 4 hours after,  No pain, dry cough, no difficulty swallowing  10:43 AM

## 2012-11-09 NOTE — Progress Notes (Signed)
   Weekly Management Note:  outpatient Current Dose:  52 Gy  Projected Dose: 70 Gy   Narrative:  The patient presents for routine under treatment assessment.  CBCT/MVCT images/Port film x-rays were reviewed.  The chart was checked. She is more tired, and starting to feel "Wobbly" when walking.  MRI negative for brain metastases 2 mo ago.  Physical Findings:  weight is 185 lb (83.915 kg). Her oral temperature is 97.5 F (36.4 C). Her blood pressure is 151/76 and her pulse is 67. Her respiration is 20 and oxygen saturation is 99%.  NAD, no nystagmus, EOMI. Coordination intact per finger to nose testing.   Impression:  The patient is tolerating radiotherapy.  Plan:  Continue radiotherapy as planned. Offered Brain MRI to r/o new metastases. She would rather wait and see if symptoms improve.  She will stay hydrated, let me know if sx worsen.  ________________________________   Lonie Peak, M.D.

## 2012-11-10 ENCOUNTER — Ambulatory Visit: Payer: Medicare Other

## 2012-11-10 ENCOUNTER — Ambulatory Visit
Admission: RE | Admit: 2012-11-10 | Discharge: 2012-11-10 | Disposition: A | Discharge status: Home / Self Care | Payer: Medicare Other | Source: Ambulatory Visit | Attending: Radiation Oncology | Admitting: Radiation Oncology

## 2012-11-11 ENCOUNTER — Ambulatory Visit
Admission: RE | Admit: 2012-11-11 | Discharge: 2012-11-11 | Disposition: A | Discharge status: Home / Self Care | Payer: Medicare Other | Source: Ambulatory Visit | Attending: Radiation Oncology | Admitting: Radiation Oncology

## 2012-11-11 ENCOUNTER — Ambulatory Visit: Payer: Medicare Other

## 2012-11-12 ENCOUNTER — Ambulatory Visit
Admission: RE | Admit: 2012-11-12 | Discharge: 2012-11-12 | Disposition: A | Discharge status: Home / Self Care | Payer: Medicare Other | Source: Ambulatory Visit | Attending: Radiation Oncology | Admitting: Radiation Oncology

## 2012-11-13 ENCOUNTER — Ambulatory Visit
Admission: RE | Admit: 2012-11-13 | Discharge: 2012-11-13 | Disposition: A | Discharge status: Home / Self Care | Payer: Medicare Other | Source: Ambulatory Visit | Attending: Radiation Oncology | Admitting: Radiation Oncology

## 2012-11-16 ENCOUNTER — Ambulatory Visit
Admission: RE | Admit: 2012-11-16 | Discharge: 2012-11-16 | Disposition: A | Discharge status: Home / Self Care | Payer: Medicare Other | Source: Ambulatory Visit | Attending: Radiation Oncology | Admitting: Radiation Oncology

## 2012-11-16 ENCOUNTER — Encounter: Payer: Self-pay | Admitting: Radiation Oncology

## 2012-11-16 NOTE — Progress Notes (Signed)
   Weekly Management Note:  outpatient Current Dose:  62 Gy  Projected Dose: 70 Gy   Narrative:  The patient presents for routine under treatment assessment.  CBCT/MVCT images/Port film x-rays were reviewed.  The chart was checked. Feels great. Wobbly symptoms went away when she started drinking more H2O. She is in great spirits.  Physical Findings:  height is 5\' 11"  (1.803 m) and weight is 184 lb 6.4 oz (83.643 kg). Her temperature is 97.1 F (36.2 C). Her blood pressure is 132/45 and her pulse is 55. Her oxygen saturation is 98%.  Minimal skin irritation over left back  Impression:  The patient is tolerating radiotherapy.  Plan:  Continue radiotherapy as planned. Suggested using a sports bra or elastic bra to avoid chafing her skin.  Will f/u in 1 month post RT.  ________________________________   Lonie Peak, M.D.

## 2012-11-16 NOTE — Progress Notes (Signed)
[name] has received 30 fractions to her Left Lower Lung.  She denies any pain or SOB. VSS  And O2 Sat 98% on RA.  She reports post nasal drip and note.  She reports that she had a cough a last week , but this is  better.  She denies any fatigue.  "I can't get over how good I feel."

## 2012-11-17 ENCOUNTER — Ambulatory Visit
Admission: RE | Admit: 2012-11-17 | Discharge: 2012-11-17 | Disposition: A | Discharge status: Home / Self Care | Payer: Medicare Other | Source: Ambulatory Visit | Attending: Radiation Oncology | Admitting: Radiation Oncology

## 2012-11-18 ENCOUNTER — Ambulatory Visit
Admission: RE | Admit: 2012-11-18 | Discharge: 2012-11-18 | Disposition: A | Discharge status: Home / Self Care | Payer: Medicare Other | Source: Ambulatory Visit | Attending: Radiation Oncology | Admitting: Radiation Oncology

## 2012-11-18 ENCOUNTER — Ambulatory Visit: Payer: Medicare Other

## 2012-11-19 ENCOUNTER — Ambulatory Visit
Admission: RE | Admit: 2012-11-19 | Discharge: 2012-11-19 | Disposition: A | Discharge status: Home / Self Care | Payer: Medicare Other | Source: Ambulatory Visit | Attending: Radiation Oncology | Admitting: Radiation Oncology

## 2012-11-20 ENCOUNTER — Ambulatory Visit
Admission: RE | Admit: 2012-11-20 | Discharge: 2012-11-20 | Disposition: A | Discharge status: Home / Self Care | Payer: Medicare Other | Source: Ambulatory Visit | Attending: Radiation Oncology | Admitting: Radiation Oncology

## 2012-11-20 ENCOUNTER — Encounter: Payer: Self-pay | Admitting: Radiation Oncology

## 2012-11-23 NOTE — Progress Notes (Signed)
  Radiation Oncology         [phone] ________________________________  Name: SHOMARI MATUSIK MRN: [id number]  Date: 11/20/2012  DOB: [birth year]  End of Treatment Note  Diagnosis:    Left lower lung poorly differentiated adenocarcinoma, T2aN1M0 STAGE IIA  Indication for treatment: Curative       Radiation treatment dates:  10/05/2012-11/20/2012  Site/dose: Left Lower Lung and hilum / 70 Gy in 35 fractions  Beams/energy:  3D conformal / 10 and 6 MV photons  Narrative: The patient tolerated radiation treatment relatively well. She developed minimal skin irritation, otherwise without major complaints. She refused chemotherapy concurrently. Of note, C-T surgery felt that surgery would carry a high risk of complications.  Plan: The patient has completed radiation treatment. The patient will return to radiation oncology clinic for routine followup in one month. I advised them to call or return sooner if they have any questions or concerns related to their recovery or treatment.  -----------------------------------  Lonie Peak, MD

## 2012-12-26 ENCOUNTER — Encounter: Payer: Self-pay | Admitting: Radiation Oncology

## 2012-12-29 ENCOUNTER — Ambulatory Visit: Admission: RE | Admit: 2012-12-29 | Payer: Medicare Other | Source: Ambulatory Visit | Admitting: Radiation Oncology

## 2012-12-29 HISTORY — DX: Personal history of irradiation: Z92.3

## 2013-01-08 ENCOUNTER — Encounter: Payer: Self-pay | Admitting: Radiation Oncology

## 2013-01-08 ENCOUNTER — Ambulatory Visit
Admission: RE | Admit: 2013-01-08 | Discharge: 2013-01-08 | Disposition: A | Discharge status: Home / Self Care | Payer: Medicare Other | Source: Ambulatory Visit | Attending: Radiation Oncology | Admitting: Radiation Oncology

## 2013-01-08 NOTE — Progress Notes (Signed)
Jodi Frazier here for follow up after treatment to her left lower lung.  She denies pain.  She does have shortness of breath.  She was in [place] for the last 7 days due to her son-in-law's death and says the humidity made her breathing worse.  Also her cough is worse.  She is occasionally bringing up off-white sputum.  She is fatigued.

## 2013-01-08 NOTE — Progress Notes (Signed)
Radiation Oncology         [phone] ________________________________  Name: Jodi Frazier MRN: [id number]  Date: 01/08/2013  DOB: 1930/07/19  Follow-Up Visit Note  Outpatient  CC: Salli Real, MD  Alleen Borne, MD  Diagnosis and Prior Radiotherapy:  Left lower lung poorly differentiated adenocarcinoma, T2aN1M0 STAGE IIA  Indication for treatment: Curative  Radiation treatment dates: 10/05/2012-11/20/2012  Site/dose: Left Lower Lung and hilum / 70 Gy in 35 fractions  Narrative:  The patient returns today for routine follow-up.  She denies pain. She did  have shortness of breath in early July. She was in [place] for the last 7 days due to her son-in-law's death and says the humidity made her breathing worse. It is better now.  Also her cough is a little worse. It is "rougher" than during RT. She is occasionally bringing up off-white sputum. She is fatigued. Weight down ~3lb. Profoundly affected by her son-in-law's death, grieving.                              ALLERGIES:  is allergic to codeine.  Meds: Current Outpatient Prescriptions  Medication Sig Dispense Refill  . albuterol (PROVENTIL HFA;VENTOLIN HFA) 108 (90 BASE) MCG/ACT inhaler Inhale 2 puffs into the lungs every 6 (six) hours as needed for wheezing.      Marland Kitchen amitriptyline (ELAVIL) 25 MG tablet Take 25 mg by mouth at bedtime.      Marland Kitchen amLODipine (NORVASC) 5 MG tablet Take 2.5 mg by mouth daily.       Marland Kitchen aspirin 325 MG EC tablet Take 325 mg by mouth daily.      . benzonatate (TESSALON) 100 MG capsule Take 1 capsule (100 mg total) by mouth 3 (three) times daily as needed for cough.  20 capsule  5  . carvedilol (COREG) 6.25 MG tablet Take 3.125 mg by mouth 2 (two) times daily with a meal.       . Cyanocobalamin (VITAMIN B-12 IJ) Inject 1,000 mcg/mL as directed every 30 (thirty) days.      . cycloSPORINE (RESTASIS) 0.05 % ophthalmic emulsion Place 1 drop into both eyes daily.      Marland Kitchen donepezil (ARICEPT) 10 MG tablet Take 10 mg by  mouth at bedtime.      Marland Kitchen levothyroxine (SYNTHROID, LEVOTHROID) 112 MCG tablet Take 112 mcg by mouth daily.      . mesalamine (LIALDA) 1.2 G EC tablet Take 1,200 mg by mouth daily with breakfast.       . nitroGLYCERIN (NITROSTAT) 0.4 MG SL tablet Place 0.4 mg under the tongue every 5 (five) minutes as needed. For chest pain      . pantoprazole (PROTONIX) 40 MG tablet Take 40 mg by mouth daily.      . ramipril (ALTACE) 10 MG capsule Take 10 mg by mouth daily.      . celecoxib (CELEBREX) 200 MG capsule Take 200 mg by mouth daily.      . hyaluronate sodium (RADIAPLEXRX) GEL Apply 1 application topically 2 (two) times daily.      . hydrocortisone (ANUSOL-HC) 25 MG suppository Place 25 mg rectally 2 (two) times daily as needed for hemorrhoids.      Marland Kitchen ibuprofen (ADVIL,MOTRIN) 800 MG tablet Take 800 mg by mouth every 8 (eight) hours as needed for pain.      . Red Yeast Rice 600 MG CAPS Take 2 capsules by mouth at bedtime.  No current facility-administered medications for this encounter.    Physical Findings: The patient is in no acute distress. Patient is alert and oriented.  height is 5\' 11"  (1.803 m) and weight is 181 lb 4.8 oz (82.237 kg). Her temperature is 98.2 F (36.8 C). Her blood pressure is 151/53 and her pulse is 64. Her oxygen saturation is 97%. .  No significant changes. Heart - RRR. Lungs CTAB. Mild residual dryness/ tanning over back in RT field.  Lab Findings: Lab Results  Component Value Date   WBC 10.6* 09/07/2012   HGB 14.1 09/07/2012   HCT 42.2 09/07/2012   MCV 88.8 09/07/2012   PLT 383 09/07/2012    Radiographic Findings: No results found.  Impression/Plan:  Doing relatively well. Some of her fatigue, mild weight loss could be from grief.  No frank signs of pneumonitis.  If nagging cough develops or SOB worsens, she knows this could be  a reason to move up her imaging.  Otherwise, I will see her in late August with CT chest at that time.  I spent 15 minutes minutes  face to face with the patient and more than 50% of that time was spent in counseling and/or coordination of care. _____________________________________   Lonie Peak, MD

## 2013-01-12 ENCOUNTER — Telehealth: Payer: Self-pay | Admitting: *Deleted

## 2013-01-12 NOTE — Telephone Encounter (Signed)
Called patient to inform of test and fu in August 2014, spoke with patient and she is aware of these appts.

## 2013-02-08 ENCOUNTER — Telehealth: Payer: Self-pay | Admitting: Pulmonary Disease

## 2013-02-08 DIAGNOSIS — J449 Chronic obstructive pulmonary disease, unspecified: Secondary | ICD-10-CM

## 2013-02-08 MED ORDER — FLUTICASONE-SALMETEROL 250-50 MCG/DOSE IN AEPB: 1.0000 | INHALATION_SPRAY | Freq: Two times a day (BID) | RESPIRATORY_TRACT | Status: DC

## 2013-02-08 MED ORDER — ALBUTEROL SULFATE HFA 108 (90 BASE) MCG/ACT IN AERS: 2.0000 | INHALATION_SPRAY | Freq: Four times a day (QID) | RESPIRATORY_TRACT | Status: DC | PRN

## 2013-02-08 NOTE — Telephone Encounter (Signed)
Advair / albuterol refilled.  Scheduled with Dr. Delton Coombes in September.

## 2013-02-22 ENCOUNTER — Ambulatory Visit (HOSPITAL_COMMUNITY)
Admission: RE | Admit: 2013-02-22 | Discharge: 2013-02-22 | Disposition: A | Discharge status: Home / Self Care | Payer: Medicare Other | Source: Ambulatory Visit | Attending: Radiation Oncology | Admitting: Radiation Oncology

## 2013-02-22 ENCOUNTER — Ambulatory Visit
Admission: RE | Admit: 2013-02-22 | Discharge: 2013-02-22 | Disposition: A | Discharge status: Home / Self Care | Payer: Medicare Other | Source: Ambulatory Visit | Attending: Radiation Oncology | Admitting: Radiation Oncology

## 2013-02-22 DIAGNOSIS — N289 Disorder of kidney and ureter, unspecified: Secondary | ICD-10-CM | POA: Insufficient documentation

## 2013-02-22 DIAGNOSIS — J90 Pleural effusion, not elsewhere classified: Secondary | ICD-10-CM | POA: Insufficient documentation

## 2013-02-22 DIAGNOSIS — Z923 Personal history of irradiation: Secondary | ICD-10-CM | POA: Insufficient documentation

## 2013-02-22 DIAGNOSIS — C3430 Malignant neoplasm of lower lobe, unspecified bronchus or lung: Secondary | ICD-10-CM | POA: Insufficient documentation

## 2013-02-22 DIAGNOSIS — R599 Enlarged lymph nodes, unspecified: Secondary | ICD-10-CM | POA: Insufficient documentation

## 2013-02-22 MED ORDER — IOHEXOL 300 MG/ML  SOLN
80.0000 mL | Freq: Once | INTRAMUSCULAR | Status: AC | PRN
  Administered 2013-02-22: 80 mL via INTRAVENOUS

## 2013-02-26 ENCOUNTER — Encounter: Payer: Self-pay | Admitting: Radiation Oncology

## 2013-02-26 ENCOUNTER — Ambulatory Visit
Admission: RE | Admit: 2013-02-26 | Discharge: 2013-02-26 | Disposition: A | Discharge status: Home / Self Care | Payer: Medicare Other | Source: Ambulatory Visit | Attending: Radiation Oncology | Admitting: Radiation Oncology

## 2013-02-26 VITALS — BP 128/49 | HR 67 | Temp 97.4°F | Ht 71.0 in | Wt 181.6 lb

## 2013-02-26 DIAGNOSIS — C3492 Malignant neoplasm of unspecified part of left bronchus or lung: Secondary | ICD-10-CM

## 2013-02-26 DIAGNOSIS — J189 Pneumonia, unspecified organism: Secondary | ICD-10-CM

## 2013-02-26 MED ORDER — PREDNISONE 5 MG PO TABS: ORAL_TABLET | ORAL | Status: DC

## 2013-02-26 NOTE — Progress Notes (Signed)
Jodi Frazier for FU today.  She reports that since the end of tx. She is experiencing increased coughing, especially when she lies down.  Her daughter reports that until 10 days ago she was not using her Advair Diskus nor her nebulizer, so today Jodi Frazier states her breathing is a little better since using these medicaions.  When she ambulates she experiencing SOB.  She denies any pain today.

## 2013-02-26 NOTE — Progress Notes (Signed)
Radiation Oncology         [phone] ________________________________  Name: Jodi Frazier MRN: [id number]  Date: 02/26/2013  DOB: [birth year]  Follow-Up Visit Note  Outpatient  CC: Salli Real, MD  Alleen Borne, MD  Diagnosis and Prior Radiotherapy:   Diagnosis and Prior Radiotherapy: Left lower lung poorly differentiated adenocarcinoma, T2aN1M0 STAGE IIA  Indication for treatment: Curative  Radiation treatment dates: 10/05/2012-11/20/2012  Site/dose: Left Lower Lung and hilum / 70 Gy in 35 fractions  (she opted NOT to receive chemotherapy)  Narrative:  The patient returns today for routine follow-up.  She reports that since the end of tx. She is experiencing increased DRY coughing, especially when she lies down. Her daughter reports that until 10 days ago she was not using her Advair Diskus nor her nebulizer, so today Jodi Frazier states her breathing is a little better since using these medicaions. When she ambulates she experiencing more SOB. She denies any pain.                          ALLERGIES:  is allergic to codeine.  Meds: Current Outpatient Prescriptions  Medication Sig Dispense Refill  . albuterol (PROVENTIL HFA;VENTOLIN HFA) 108 (90 BASE) MCG/ACT inhaler Inhale 2 puffs into the lungs every 6 (six) hours as needed for wheezing.  1 Inhaler  3  . amitriptyline (ELAVIL) 25 MG tablet Take 25 mg by mouth at bedtime.      Marland Kitchen amLODipine (NORVASC) 5 MG tablet Take 2.5 mg by mouth daily.       Marland Kitchen aspirin 81 MG tablet Take 81 mg by mouth daily.      . benzonatate (TESSALON) 100 MG capsule Take 1 capsule (100 mg total) by mouth 3 (three) times daily as needed for cough.  20 capsule  5  . carvedilol (COREG) 6.25 MG tablet Take 3.125 mg by mouth 2 (two) times daily with a meal.       . Cyanocobalamin (VITAMIN B-12 IJ) Inject 1,000 mcg/mL as directed every 30 (thirty) days.      . cycloSPORINE (RESTASIS) 0.05 % ophthalmic emulsion Place 1 drop into both eyes daily.      Marland Kitchen donepezil  (ARICEPT) 10 MG tablet Take 10 mg by mouth at bedtime.      . Fluticasone-Salmeterol (ADVAIR DISKUS) 250-50 MCG/DOSE AEPB Inhale 1 puff into the lungs 2 (two) times daily.  60 each  3  . hydrocortisone (ANUSOL-HC) 25 MG suppository Place 25 mg rectally 2 (two) times daily as needed for hemorrhoids.      Marland Kitchen ibuprofen (ADVIL,MOTRIN) 800 MG tablet Take 800 mg by mouth every 8 (eight) hours as needed for pain.      Marland Kitchen levothyroxine (SYNTHROID, LEVOTHROID) 112 MCG tablet Take 112 mcg by mouth daily.      . mesalamine (LIALDA) 1.2 G EC tablet Take 1,200 mg by mouth daily with breakfast.       . nitroGLYCERIN (NITROSTAT) 0.4 MG SL tablet Place 0.4 mg under the tongue every 5 (five) minutes as needed. For chest pain      . pantoprazole (PROTONIX) 40 MG tablet Take 40 mg by mouth daily.      . ramipril (ALTACE) 10 MG capsule Take 10 mg by mouth daily.      . Red Yeast Rice 600 MG CAPS Take 2 capsules by mouth at bedtime.      . celecoxib (CELEBREX) 200 MG capsule Take 200 mg by mouth daily.      Marland Kitchen  predniSONE (DELTASONE) 5 MG tablet Take 4 tabs BID x 3 days, then 2 tabs BID x 4 days, then 1 tab BID x 4 days, then 1 tab daily x 4 days, then stop.  52 tablet  0   No current facility-administered medications for this encounter.    Physical Findings: The patient is in no acute distress. Patient is alert and oriented.  height is 5\' 11"  (1.803 m) and weight is 181 lb 9.6 oz (82.373 kg). Her temperature is 97.4 F (36.3 C). Her blood pressure is 128/49 and her pulse is 67. Her oxygen saturation is 97%. .  General: Alert and oriented, in no acute distress HEENT: Head is normocephalic.  Heart: Regular in rate and rhythm with no murmurs, rubs, or gallops. Chest: Clear to auscultation bilaterally, with no rhonchi, wheezes, or rales. Dry cough with deep inspiration. Psychiatric: Judgment and insight are intact. Affect is appropriate.   Lab Findings: Lab Results  Component Value Date   WBC 10.6* 09/07/2012   HGB  14.1 09/07/2012   HCT 42.2 09/07/2012   MCV 88.8 09/07/2012   PLT 383 09/07/2012    Radiographic Findings: Ct Chest W Contrast  02/22/2013   CLINICAL DATA:  Lung cancer. Radiotherapy completed. Shortness of Breath.  EXAM: CT CHEST WITH CONTRAST  TECHNIQUE: Multidetector CT imaging of the chest was performed during intravenous contrast administration.  CONTRAST:  80mL OMNIPAQUE IOHEXOL 300 MG/ML  SOLN  COMPARISON:  Biopsy images from 09/07/2012. Chest radiograph from 09/07/2012  FINDINGS: Upper paratracheal lymph node short axis 0 degrees 0.7 cm, image 13 of series 2. Smaller paratracheal nodes are also observed, and there is a 1.1 cm short axis node anterior to the carina. 0.6 cm right posterior paratracheal node, image 11 of series 2. Right hilar node short axis diameter 0.7 cm.  Small AP window lymph nodes are observed. Small left infrahilar nodes are present.  There is a small left pleural effusion, nonspecific for transudative or exudative etiology.  Biapical pleural parenchymal scarring observed. 2.8 x 2.2 cm left lower lobe pulmonary nodule noted, image 26 of series 5. Abnormal patchy regions of consolidation and interstitial accentuation in the lingula and left lower lobe noted. There is some faint interstitial accentuation inferiorly in the right middle lobe with patchy faint opacities along the major fissure and anteriorly in the right upper lobe. There is associated bronchiectasis and airway plugging in the right middle lobe. 0.9 x 0.7 cm ground-glass opacity noted in the right lower lobe, image 38 of series 5.  On incidental imaging through the upper abdomen, there are bilateral renal hypodense lesions favoring cysts, along with some faintly increased density along the right mid kidney collecting system on image 63 of series 2.  IMPRESSION: 1. Reduced size of the left lung mass, previously 4.9 x 3.7 cm on CT biopsy images, currently 2.8 x 2.2 cm. 2. Considerable patchy airspace opacity and  interstitial opacity in the lingula and left lower lobe, favoring radiation pneumonitis. 3. There are some patchy indistinct ground-glass opacities in the right upper lobe, along the right major fissure, and in the right lower lobe. There is clearly some airway plugging in the right middle lobe and I suspect that much of this is due to bronchiolitis/ atypical infection. However, the 9 x 7 mm right lower lobe ground-glass opacity is unchanged from 09/07/2012, and requires annual surveillance in order to exclude growth/ low grade adenocarcinoma. 4. Mildly enlarged lymph node anterior to the carina with short axis diameter of 1.1  cm. 5. Small left pleural effusion, nonspecific for transudative versus exudative ideology. 6. Questionable density within or along the right mid kidney collecting system. This could represent a vessel adjacent to the collecting system but on today's limited look at the kidneys I cannot completely exclude a transitional cell carcinoma. Dedicated pre- and post-contrast CT or MRI of the upper abdomen may be warranted.   Electronically Signed   By: Herbie Baltimore   On: 02/22/2013 11:20    Impression/Plan:  Interval response to definitive radiotherapy for Stage IIA NSCLC.  She continues to decline a medical oncology consult to consider sequential chemotherapy (she had declined concurrent chemotherapy consultation).    Pneumonitis: I suspect her SOB and dry cough are from radiation pneumonitis.  There is also radiographic evidence of this.  Will start a prednisone taper for her.  She will call me if this doesn't help, or if symptoms relapse at end of taper. She sees Dr. Delton Coombes next week.  F/u imaging 72mo w/ Dr. Dayton Scrape as I will be on maternity leave at that time. She requested Dr. Dayton Scrape as she met him in the past. CT Chest (to survey lung cancer) and abdomen (to investigate mildly suspicious report of density along right mid kidney collecting system).  Patient was offered a CT of abdomen  in the next several days but prefers to wait 3 months and consolidate her imaging.  I spent 20 minutes face to face with the patient and more than 50% of that time was spent in counseling and/or coordination of care. _____________________________________   Lonie Peak, MD

## 2013-03-02 ENCOUNTER — Encounter: Payer: Self-pay | Admitting: Radiation Oncology

## 2013-03-05 ENCOUNTER — Ambulatory Visit (INDEPENDENT_AMBULATORY_CARE_PROVIDER_SITE_OTHER): Payer: Medicare Other | Admitting: Emergency Medicine

## 2013-03-05 ENCOUNTER — Encounter: Payer: Self-pay | Admitting: Emergency Medicine

## 2013-03-05 VITALS — BP 138/64 | HR 62 | Temp 97.6°F | Ht 71.0 in | Wt 182.4 lb

## 2013-03-05 DIAGNOSIS — J700 Acute pulmonary manifestations due to radiation: Secondary | ICD-10-CM

## 2013-03-05 DIAGNOSIS — J449 Chronic obstructive pulmonary disease, unspecified: Secondary | ICD-10-CM

## 2013-03-05 MED ORDER — PREDNISONE 20 MG PO TABS: 60.0000 mg | ORAL_TABLET | Freq: Every day | ORAL | Status: DC

## 2013-03-05 NOTE — Progress Notes (Signed)
Subjective:    Patient ID: Jodi Frazier, female    DOB: August 16, 1930, 79 y.o.   MRN: [id number]  HPI 79 yo woman, former smoker (40 pk-yrs), HTN, CAD, adenoCA LLL s/p XRT 3-5/'14, COPD, rhinitis, GERD. She is on Advair + albuterol prn. She has had some increased cough and dyspnea since the end of XRT. Her CT scan chest done 02/22/13 shows shrinkage of LLL mass but some associated GGI in the L lung. She was just started on a pred taper by Dr Karoline Caldwell for this.    Review of Systems  Constitutional: Negative for fever and unexpected weight change.  HENT: Positive for rhinorrhea. Negative for ear pain, nosebleeds, congestion, sore throat, sneezing, trouble swallowing, dental problem, postnasal drip and sinus pressure.   Eyes: Negative for redness and itching.  Respiratory: Positive for cough and shortness of breath. Negative for chest tightness and wheezing.   Cardiovascular: Negative for palpitations and leg swelling.  Gastrointestinal: Negative for nausea and vomiting.  Genitourinary: Negative for dysuria.  Musculoskeletal: Negative for joint swelling.  Skin: Negative for rash.  Neurological: Positive for light-headedness. Negative for headaches.  Hematological: Does not bruise/bleed easily.  Psychiatric/Behavioral: Negative for dysphoric mood. The patient is not nervous/anxious.       Family History  Problem Relation Age of Onset  . Anesthesia problems Mother   . Hypotension Neg Hx   . Malignant hyperthermia Neg Hx   . Pseudochol deficiency Neg Hx   . Cancer Maternal Aunt     breast     History   Social History  . Marital Status: Widowed    Spouse Name: N/A    Number of Children: 4  . Years of Education: N/A   Occupational History  . retired     Audiological scientist   Social History Main Topics  . Smoking status: Former Smoker -- 1.00 packs/day for 40 years    Types: Cigarettes    Quit date: 08/26/2002  . Smokeless tobacco: Never Used     Comment: quit several yrs ago  .  Alcohol Use: Yes     Comment: 2 times week alcohol  . Drug Use: No  . Sexual Activity: No   Other Topics Concern  . Not on file   Social History Narrative   Daughter is nurse in neuro ICU     Allergies  Allergen Reactions  . Codeine Hives     Outpatient Prescriptions Prior to Visit  Medication Sig Dispense Refill  . albuterol (PROVENTIL HFA;VENTOLIN HFA) 108 (90 BASE) MCG/ACT inhaler Inhale 2 puffs into the lungs every 6 (six) hours as needed for wheezing.  1 Inhaler  3  . amitriptyline (ELAVIL) 25 MG tablet Take 25 mg by mouth at bedtime.      Marland Kitchen amLODipine (NORVASC) 5 MG tablet Take 2.5 mg by mouth daily.       Marland Kitchen aspirin 81 MG tablet Take 81 mg by mouth daily.      . benzonatate (TESSALON) 100 MG capsule Take 1 capsule (100 mg total) by mouth 3 (three) times daily as needed for cough.  20 capsule  5  . carvedilol (COREG) 6.25 MG tablet Take 3.125 mg by mouth 2 (two) times daily with a meal.       . donepezil (ARICEPT) 10 MG tablet Take 10 mg by mouth at bedtime.      . Fluticasone-Salmeterol (ADVAIR DISKUS) 250-50 MCG/DOSE AEPB Inhale 1 puff into the lungs 2 (two) times daily.  60 each  3  .  hydrocortisone (ANUSOL-HC) 25 MG suppository Place 25 mg rectally 2 (two) times daily as needed for hemorrhoids.      Marland Kitchen ibuprofen (ADVIL,MOTRIN) 800 MG tablet Take 800 mg by mouth every 8 (eight) hours as needed for pain.      Marland Kitchen levothyroxine (SYNTHROID, LEVOTHROID) 112 MCG tablet Take 112 mcg by mouth daily.      . mesalamine (LIALDA) 1.2 G EC tablet Take 1,200 mg by mouth daily with breakfast.       . nitroGLYCERIN (NITROSTAT) 0.4 MG SL tablet Place 0.4 mg under the tongue every 5 (five) minutes as needed. For chest pain      . pantoprazole (PROTONIX) 40 MG tablet Take 40 mg by mouth daily.      . predniSONE (DELTASONE) 5 MG tablet Take 4 tabs BID x 3 days, then 2 tabs BID x 4 days, then 1 tab BID x 4 days, then 1 tab daily x 4 days, then stop.  52 tablet  0  . ramipril (ALTACE) 10 MG capsule  Take 10 mg by mouth daily.      . Red Yeast Rice 600 MG CAPS Take 2 capsules by mouth at bedtime.      . celecoxib (CELEBREX) 200 MG capsule Take 200 mg by mouth daily.      . Cyanocobalamin (VITAMIN B-12 IJ) Inject 1,000 mcg/mL as directed every 30 (thirty) days.      . cycloSPORINE (RESTASIS) 0.05 % ophthalmic emulsion Place 1 drop into both eyes daily.       No facility-administered medications prior to visit.    Gen: Pleasant, well-nourished, in no distress,  normal affect  ENT: No lesions,  mouth clear,  oropharynx clear, no postnasal drip  Neck: No JVD, no TMG, no carotid bruits  Lungs: No use of accessory muscles, no dullness to percussion, clear without rales or rhonchi  Cardiovascular: RRR, heart sounds normal, no murmur or gallops, no peripheral edema  Musculoskeletal: No deformities, no cyanosis or clubbing  Neuro: alert, non focal  Skin: Warm, no lesions or rashes     Objective:   Physical Exam Filed Vitals:   03/05/13 1612  BP: 138/64  Pulse: 62  Temp: 97.6 F (36.4 C)  TempSrc: Oral  Height: 5\' 11"  (1.803 m)  Weight: 182 lb 6.4 oz (82.736 kg)  SpO2: 91%          Assessment & Plan:  Radiation pneumonitis Agree that this is probably radiation pneumonitis. This could explain her cough and her progressive dyspnea. She is currently on a steroid taper. Standard therapy would be prednisone 60 mg a day for 2 weeks with a plan to then wean to 0. I will adjust her prednisone today. She will return for full pulmonary function testing after the prednisone is completed.   COPD (chronic obstructive pulmonary disease) Will obtain her old pulmonary function testing from [facility] and perform repeat testing (post XRT). Suspect we can alter her bronchodilator regimen with some benefit. I will wait to do this until after she is on prednisone and her new PFTs are performed.

## 2013-03-05 NOTE — Assessment & Plan Note (Signed)
Will obtain her old pulmonary function testing from [facility] and perform repeat testing (post XRT). Suspect we can alter her bronchodilator regimen with some benefit. I will wait to do this until after she is on prednisone and her new PFTs are performed.

## 2013-03-05 NOTE — Assessment & Plan Note (Signed)
Agree that this is probably radiation pneumonitis. This could explain her cough and her progressive dyspnea. She is currently on a steroid taper. Standard therapy would be prednisone 60 mg a day for 2 weeks with a plan to then wean to 0. I will adjust her prednisone today. She will return for full pulmonary function testing after the prednisone is completed.

## 2013-03-05 NOTE — Patient Instructions (Addendum)
We will adjust her prednisone to 60 mg daily for 2 weeks and then be decreased to 40 mg daily until your followup appointment We will repeat her pulmonary function testing and obtain year-old testing from [facility] after you are off the prednisone We will adjust your inhaler medications after the prednisone is finished Follow with Dr. Delton Coombes in one month or sooner if you have any problems

## 2013-04-08 ENCOUNTER — Encounter: Payer: Self-pay | Admitting: Emergency Medicine

## 2013-04-08 ENCOUNTER — Ambulatory Visit (INDEPENDENT_AMBULATORY_CARE_PROVIDER_SITE_OTHER): Payer: Medicare Other | Admitting: Emergency Medicine

## 2013-04-08 VITALS — BP 140/72 | HR 67 | Temp 97.7°F | Ht 71.0 in | Wt 182.6 lb

## 2013-04-08 DIAGNOSIS — R05 Cough: Secondary | ICD-10-CM

## 2013-04-08 DIAGNOSIS — J700 Acute pulmonary manifestations due to radiation: Secondary | ICD-10-CM

## 2013-04-08 DIAGNOSIS — J449 Chronic obstructive pulmonary disease, unspecified: Secondary | ICD-10-CM

## 2013-04-08 NOTE — Assessment & Plan Note (Signed)
We will taper pred to zero over 1 month.   We will repeat Ct scan in November to assess the GGI and LLL lesion

## 2013-04-08 NOTE — Progress Notes (Signed)
  Subjective:    Patient ID: Jodi Frazier, female    DOB: October 13, 1930, 79 y.o.   MRN: [id number]  HPI 79 yo woman, former smoker (40 pk-yrs), HTN, CAD, adenoCA LLL s/p XRT 3-5/'14, COPD, rhinitis, GERD. She is on Advair + albuterol prn. She has had some increased cough and dyspnea since the end of XRT. Her CT scan chest done 02/22/13 shows shrinkage of LLL mass but some associated GGI in the L lung. She was just started on a pred taper by Dr Karoline Caldwell for this.   ROV 04/08/13 -- hx adenoCA LLL, COPD. She has undergone XRT to LLL lesion, was seen last time with some associated GGI suspicious for radiation pneumonitis. We treated with prednisone, she never tapered to 40mg , has been on 60mg  qd. She is coughing more since last time. Taking advair qd, albuterol prn (2x a week).    Review of Systems  Constitutional: Negative for fever and unexpected weight change.  HENT: Positive for rhinorrhea. Negative for congestion, dental problem, ear pain, nosebleeds, postnasal drip, sinus pressure, sneezing, sore throat and trouble swallowing.   Eyes: Negative for redness and itching.  Respiratory: Positive for cough and shortness of breath. Negative for chest tightness and wheezing.   Cardiovascular: Negative for palpitations and leg swelling.  Gastrointestinal: Negative for nausea and vomiting.  Genitourinary: Negative for dysuria.  Musculoskeletal: Negative for joint swelling.  Skin: Negative for rash.  Neurological: Positive for light-headedness. Negative for headaches.  Hematological: Does not bruise/bleed easily.  Psychiatric/Behavioral: Negative for dysphoric mood. The patient is not nervous/anxious.      Filed Vitals:   04/08/13 1346  BP: 140/72  Pulse: 67  Temp: 97.7 F (36.5 C)  TempSrc: Oral  Height: 5\' 11"  (1.803 m)  Weight: 182 lb 9.6 oz (82.827 kg)  SpO2: 96%    Gen: Pleasant, well-nourished, in no distress,  normal affect  ENT: No lesions,  mouth clear,  oropharynx clear, no  postnasal drip  Neck: No JVD, no TMG, no carotid bruits  Lungs: No use of accessory muscles, no dullness to percussion, clear without rales or rhonchi  Cardiovascular: RRR, heart sounds normal, no murmur or gallops, no peripheral edema  Musculoskeletal: No deformities, no cyanosis or clubbing  Neuro: alert, non focal  Skin: Warm, no lesions or rashes     Objective:   Physical Exam Filed Vitals:   04/08/13 1346  BP: 140/72  Pulse: 67  Temp: 97.7 F (36.5 C)  TempSrc: Oral  Height: 5\' 11"  (1.803 m)  Weight: 182 lb 9.6 oz (82.827 kg)  SpO2: 96%          Assessment & Plan:  Radiation pneumonitis We will taper pred to zero over 1 month.   We will repeat Ct scan in November to assess the GGI and LLL lesion   COPD (chronic obstructive pulmonary disease) Stop advair Trial Breo qd She will call to let us know if she benefits.   Cough Suspect contribution of PND, possibly GERD on pred - start taking protonix daily - add loratadine and chlorpheniramine

## 2013-04-08 NOTE — Assessment & Plan Note (Signed)
Suspect contribution of PND, possibly GERD on pred - start taking protonix daily - add loratadine and chlorpheniramine

## 2013-04-08 NOTE — Patient Instructions (Addendum)
Please begin to taper your Prednisone: take 40mg  for 1 week, then 30mg  for 1 week, then 20mg  for 1 week, then 10mg  for 1 week, then stop.  Stop Advair Please try Breo 1 inhalation daily for a month. We will discuss whether you benefit either next visit or by phone Start taking protonix every day Start loratadine 10mg  daily and a decongestant containing chlorpheniramine as directed Follow with Dr Delton Coombes in 4-5 weeks

## 2013-04-08 NOTE — Assessment & Plan Note (Signed)
Stop advair Trial Breo qd She will call to let us know if she benefits.

## 2013-04-24 ENCOUNTER — Encounter (HOSPITAL_COMMUNITY): Payer: Self-pay | Admitting: Emergency Medicine

## 2013-04-24 ENCOUNTER — Inpatient Hospital Stay (HOSPITAL_COMMUNITY): Payer: Medicare Other

## 2013-04-24 ENCOUNTER — Inpatient Hospital Stay (HOSPITAL_COMMUNITY)
Admission: EM | Admit: 2013-04-24 | Discharge: 2013-04-27 | DRG: 200 | Disposition: A | Discharge status: Home / Self Care | Payer: Medicare Other | Attending: Critical Care Medicine | Admitting: Critical Care Medicine

## 2013-04-24 ENCOUNTER — Emergency Department (HOSPITAL_COMMUNITY): Payer: Medicare Other

## 2013-04-24 DIAGNOSIS — I10 Essential (primary) hypertension: Secondary | ICD-10-CM | POA: Diagnosis present

## 2013-04-24 DIAGNOSIS — C3432 Malignant neoplasm of lower lobe, left bronchus or lung: Secondary | ICD-10-CM | POA: Diagnosis present

## 2013-04-24 DIAGNOSIS — I252 Old myocardial infarction: Secondary | ICD-10-CM

## 2013-04-24 DIAGNOSIS — J449 Chronic obstructive pulmonary disease, unspecified: Secondary | ICD-10-CM

## 2013-04-24 DIAGNOSIS — J700 Acute pulmonary manifestations due to radiation: Secondary | ICD-10-CM | POA: Diagnosis present

## 2013-04-24 DIAGNOSIS — Z9861 Coronary angioplasty status: Secondary | ICD-10-CM

## 2013-04-24 DIAGNOSIS — C3430 Malignant neoplasm of lower lobe, unspecified bronchus or lung: Secondary | ICD-10-CM | POA: Diagnosis present

## 2013-04-24 DIAGNOSIS — E7800 Pure hypercholesterolemia, unspecified: Secondary | ICD-10-CM | POA: Diagnosis present

## 2013-04-24 DIAGNOSIS — C3492 Malignant neoplasm of unspecified part of left bronchus or lung: Secondary | ICD-10-CM

## 2013-04-24 DIAGNOSIS — Z87891 Personal history of nicotine dependence: Secondary | ICD-10-CM

## 2013-04-24 DIAGNOSIS — E039 Hypothyroidism, unspecified: Secondary | ICD-10-CM | POA: Diagnosis present

## 2013-04-24 DIAGNOSIS — J939 Pneumothorax, unspecified: Secondary | ICD-10-CM

## 2013-04-24 DIAGNOSIS — I2510 Atherosclerotic heart disease of native coronary artery without angina pectoris: Secondary | ICD-10-CM | POA: Diagnosis present

## 2013-04-24 DIAGNOSIS — R42 Dizziness and giddiness: Secondary | ICD-10-CM | POA: Diagnosis present

## 2013-04-24 DIAGNOSIS — Z96659 Presence of unspecified artificial knee joint: Secondary | ICD-10-CM

## 2013-04-24 DIAGNOSIS — Z923 Personal history of irradiation: Secondary | ICD-10-CM

## 2013-04-24 DIAGNOSIS — R55 Syncope and collapse: Secondary | ICD-10-CM | POA: Diagnosis present

## 2013-04-24 DIAGNOSIS — J438 Other emphysema: Secondary | ICD-10-CM | POA: Diagnosis present

## 2013-04-24 DIAGNOSIS — J9383 Other pneumothorax: Principal | ICD-10-CM | POA: Diagnosis present

## 2013-04-24 DIAGNOSIS — R062 Wheezing: Secondary | ICD-10-CM | POA: Diagnosis present

## 2013-04-24 LAB — BASIC METABOLIC PANEL
BUN: 17 mg/dL (ref 6–23)
Calcium: 10.3 mg/dL (ref 8.4–10.5)
Chloride: 96 mEq/L (ref 96–112)
Creatinine, Ser: 1.05 mg/dL (ref 0.50–1.10)
GFR calc Af Amer: 57 mL/min — ABNORMAL LOW (ref >90)
GFR calc non Af Amer: 49 mL/min — ABNORMAL LOW (ref >90)

## 2013-04-24 LAB — CBC
HCT: 41.7 % (ref 36.0–46.0)
MCH: 29.6 pg (ref 26.0–34.0)
MCHC: 33.8 g/dL (ref 30.0–36.0)
MCV: 87.4 fL (ref 78.0–100.0)
Platelets: 233 10*3/uL (ref 150–400)
RDW: 15.8 % — ABNORMAL HIGH (ref 11.5–15.5)
WBC: 8.8 10*3/uL (ref 4.0–10.5)

## 2013-04-24 LAB — TROPONIN I: Troponin I: <0.3 ng/mL (ref <0.30)

## 2013-04-24 LAB — MRSA PCR SCREENING: MRSA by PCR: NEGATIVE

## 2013-04-24 LAB — POCT I-STAT TROPONIN I: Troponin i, poc: 0.02 ng/mL (ref 0.00–0.08)

## 2013-04-24 MED ORDER — MESALAMINE 1.2 G PO TBEC
1200.0000 mg | DELAYED_RELEASE_TABLET | Freq: Every day | ORAL | Status: DC
  Administered 2013-04-25 – 2013-04-27 (×3): 1.2 g via ORAL
  Filled 2013-04-24 (×4): qty 1

## 2013-04-24 MED ORDER — AMITRIPTYLINE HCL 25 MG PO TABS
25.0000 mg | ORAL_TABLET | Freq: Every day | ORAL | Status: DC
  Administered 2013-04-24 – 2013-04-26 (×3): 25 mg via ORAL
  Filled 2013-04-24 (×4): qty 1

## 2013-04-24 MED ORDER — CYCLOSPORINE 0.05 % OP EMUL
1.0000 [drp] | Freq: Every day | OPHTHALMIC | Status: DC
  Administered 2013-04-26 – 2013-04-27 (×2): 1 [drp] via OPHTHALMIC
  Filled 2013-04-24 (×3): qty 1

## 2013-04-24 MED ORDER — DONEPEZIL HCL 10 MG PO TABS
10.0000 mg | ORAL_TABLET | Freq: Every day | ORAL | Status: DC
  Administered 2013-04-24 – 2013-04-26 (×3): 10 mg via ORAL
  Filled 2013-04-24 (×4): qty 1

## 2013-04-24 MED ORDER — MOMETASONE FURO-FORMOTEROL FUM 100-5 MCG/ACT IN AERO
2.0000 | INHALATION_SPRAY | Freq: Two times a day (BID) | RESPIRATORY_TRACT | Status: DC
  Administered 2013-04-24 – 2013-04-27 (×6): 2 via RESPIRATORY_TRACT
  Filled 2013-04-24: qty 8.8

## 2013-04-24 MED ORDER — IOHEXOL 350 MG/ML SOLN
100.0000 mL | Freq: Once | INTRAVENOUS | Status: AC | PRN
  Administered 2013-04-24: 100 mL via INTRAVENOUS

## 2013-04-24 MED ORDER — CARVEDILOL 3.125 MG PO TABS
3.1250 mg | ORAL_TABLET | Freq: Two times a day (BID) | ORAL | Status: DC
  Administered 2013-04-24 – 2013-04-25 (×2): 3.125 mg via ORAL
  Filled 2013-04-24 (×5): qty 1

## 2013-04-24 MED ORDER — PANTOPRAZOLE SODIUM 40 MG PO TBEC
40.0000 mg | DELAYED_RELEASE_TABLET | Freq: Every morning | ORAL | Status: DC
  Administered 2013-04-25 – 2013-04-27 (×3): 40 mg via ORAL
  Filled 2013-04-24 (×4): qty 1

## 2013-04-24 MED ORDER — ALBUTEROL SULFATE (5 MG/ML) 0.5% IN NEBU
5.0000 mg | INHALATION_SOLUTION | Freq: Once | RESPIRATORY_TRACT | Status: AC
  Administered 2013-04-24: 5 mg via RESPIRATORY_TRACT
  Filled 2013-04-24: qty 1

## 2013-04-24 MED ORDER — ALBUTEROL SULFATE (5 MG/ML) 0.5% IN NEBU
2.5000 mg | INHALATION_SOLUTION | Freq: Four times a day (QID) | RESPIRATORY_TRACT | Status: DC
  Administered 2013-04-24 – 2013-04-25 (×4): 2.5 mg via RESPIRATORY_TRACT
  Filled 2013-04-24 (×4): qty 0.5

## 2013-04-24 MED ORDER — METHYLPREDNISOLONE SODIUM SUCC 40 MG IJ SOLR
40.0000 mg | Freq: Four times a day (QID) | INTRAMUSCULAR | Status: DC
  Administered 2013-04-24 – 2013-04-25 (×3): 40 mg via INTRAVENOUS
  Filled 2013-04-24 (×7): qty 1

## 2013-04-24 MED ORDER — AMLODIPINE BESYLATE 2.5 MG PO TABS
2.5000 mg | ORAL_TABLET | Freq: Every morning | ORAL | Status: DC
  Administered 2013-04-25: 2.5 mg via ORAL
  Filled 2013-04-24: qty 1

## 2013-04-24 MED ORDER — IPRATROPIUM BROMIDE 0.02 % IN SOLN
0.5000 mg | Freq: Four times a day (QID) | RESPIRATORY_TRACT | Status: DC
  Administered 2013-04-24 – 2013-04-25 (×4): 0.5 mg via RESPIRATORY_TRACT
  Filled 2013-04-24 (×4): qty 2.5

## 2013-04-24 MED ORDER — MORPHINE SULFATE 2 MG/ML IJ SOLN
2.0000 mg | INTRAMUSCULAR | Status: DC | PRN
  Administered 2013-04-24 – 2013-04-26 (×3): 2 mg via INTRAVENOUS
  Filled 2013-04-24 (×3): qty 1

## 2013-04-24 MED ORDER — ASPIRIN 81 MG PO CHEW
81.0000 mg | CHEWABLE_TABLET | Freq: Every evening | ORAL | Status: DC
  Administered 2013-04-24 – 2013-04-26 (×4): 81 mg via ORAL
  Filled 2013-04-24 (×5): qty 1

## 2013-04-24 MED ORDER — SODIUM CHLORIDE 0.9 % IV SOLN
INTRAVENOUS | Status: DC
  Administered 2013-04-24: 1000 mL via INTRAVENOUS
  Administered 2013-04-25: 12:00:00 via INTRAVENOUS
  Administered 2013-04-26: 50 mL via INTRAVENOUS
  Administered 2013-04-27: via INTRAVENOUS

## 2013-04-24 MED ORDER — ENOXAPARIN SODIUM 40 MG/0.4ML ~~LOC~~ SOLN
40.0000 mg | SUBCUTANEOUS | Status: DC
  Administered 2013-04-24 – 2013-04-26 (×3): 40 mg via SUBCUTANEOUS
  Filled 2013-04-24 (×5): qty 0.4

## 2013-04-24 MED ORDER — LEVOTHYROXINE SODIUM 112 MCG PO TABS
112.0000 ug | ORAL_TABLET | Freq: Every day | ORAL | Status: DC
  Administered 2013-04-25 – 2013-04-27 (×3): 112 ug via ORAL
  Filled 2013-04-24 (×4): qty 1

## 2013-04-24 NOTE — Procedures (Signed)
Chest Tube Insertion Procedure Note Jodi Frazier CHEST TUBE  Indications:  Clinically significant Pneumothorax  Pre-operative Diagnosis: Pneumothorax  Post-operative Diagnosis: Pneumothorax  Procedure Details  Informed consent was obtained for the procedure, including sedation.  Risks of lung perforation, hemorrhage, arrhythmia, and adverse drug reaction were discussed.   After sterile skin prep, using standard technique, a Jodi Frazier Chest  tube was placed in the left anterior 5th rib space.  Findings: None  Estimated Blood Loss:  No          Specimens:  None              Complications:  None; patient tolerated the procedure well.         Disposition: to SDU         Condition: stable  Attending Attestation: I performed the procedure.  Shan Levans MD Beeper  [phone]  Cell  [phone]  If no response or cell goes to voicemail, call beeper [phone]

## 2013-04-24 NOTE — ED Notes (Signed)
70 mls out of CT

## 2013-04-24 NOTE — ED Provider Notes (Signed)
CSN: [id number]     Arrival date & time 04/24/13  1007 History   First MD Initiated Contact with Patient 04/24/13 1023     Chief Complaint  Patient presents with  . Shortness of Breath   (Consider location/radiation/quality/duration/timing/severity/associated sxs/prior Treatment) HPI Comments: Patient is an 79 year old female with past medical history significant for lung cancer. This was diagnosed approximately 5 months ago and she has been undergoing treatment with radiation. She decided she did not want chemotherapy and thus far the results from the radiation have been encouraging. She recently finished up a course of prednisone which she's been on for the past month. She started about 3 days ago complaining of shortness of breath. She denies any chest pain. She does report coughing up some yellow sputum on occasion but denies fevers or chills.  Patient is a 79 y.o. female presenting with shortness of breath. The history is provided by the patient.  Shortness of Breath Severity:  Moderate Onset quality:  Gradual Duration:  3 days Timing:  Constant Progression:  Worsening Chronicity:  New Context: activity   Relieved by:  Nothing Worsened by:  Nothing tried Ineffective treatments:  None tried Associated symptoms: cough   Associated symptoms: no abdominal pain, no fever and no hemoptysis     Past Medical History  Diagnosis Date  . Hypertension   . Colitis   . Coronary artery disease   . High cholesterol     takes Red Yeast Rice and Fish OIl daily  . Kidney stones   . PONV (postoperative nausea and vomiting)   . MI (myocardial infarction) 2010  . Shortness of breath     with exertion  . Pneumonia     hx of 20+yrs ago  . COPD (chronic obstructive pulmonary disease)     pleurisy or COPD exascerbation > 52yr ago  . Headache(784.0)   . Vertigo     HTN related bc gets up too fast  . Arthritis   . Bruises easily   . History of shingles 62yrs ago    in eye;occasionally gets  some residual   . Accessory skin tags     arms/legs  . GERD (gastroesophageal reflux disease)   . Colitis   . Diverticulosis   . Urinary frequency   . Stress incontinence   . History of kidney stones   . Hypothyroidism     takes sYnthroid daily  . Cataract     early stage on right  . Macular degeneration, wet   . Depression     some but takes Amitryptylline nightly  . Right-sided chest wall pain 08/28/12    PRESENTATION - RIGHT SIDED ANTERIOR CHEST PAIN  . Weight loss 08/28/12    10 LB WEIGHT LOSS OVER 3 MONTHS  . Lung mass 08/12/12    CHEST-XRAY/ PET -LOBULAR MASS LLL - 4.1 X 3.6 X 4.2 CM  . Adenocarcinoma of lung 09/07/12    needle core bx-LLL-adenocarcinoma  . Dementia     mild  . S/P radiation therapy  10/05/2012-11/20/2012    Left Lower Lung and hilum / 70 Gy in 35 fractions   Past Surgical History  Procedure Laterality Date  . Hip arthroscopy  40yr ago    right hip-replacement  . Knee arthroscopy      LT  . Total knee arthroplasty  36yrs ago    left  . Appendectomy  65yrs ago  . Abdominal hysterectomy  51yrs ago   . Lithotripsy  2011  . Cataract surgery  2012    left  . Eye surgery  5yrs ago  . Bladder surgery  2001    tacked  . Coronary angioplasty with stent placement  2010    2 in rt coronary artery and 1 in another spot  . Colonoscopy    . Esophagogastroduodenoscopy    . Total knee arthroplasty  09/09/2011    Procedure: TOTAL KNEE ARTHROPLASTY;  Surgeon: Raymon Mutton, MD;  Location: [facility] OR;  Service: Orthopedics;  Laterality: Right;  Right Total Knee Arthroplasty  . Bronchial brush biopsy Left 08/17/12    LLL Bronchial Washing / Brushing and Bronchial Biopsy: Negative for malignancy  . Left lower lobe needle core biopsy Left 09/07/12    Poorly Differentiated Adenocarcinoma   . Joint replacement     Family History  Problem Relation Age of Onset  . Anesthesia problems Mother   . Hypotension Neg Hx   . Malignant hyperthermia Neg Hx   . Pseudochol  deficiency Neg Hx   . Cancer Maternal Aunt     breast   History  Substance Use Topics  . Smoking status: Former Smoker -- 1.00 packs/day for 40 years    Types: Cigarettes    Quit date: 08/26/2002  . Smokeless tobacco: Never Used     Comment: quit several yrs ago  . Alcohol Use: Yes     Comment: 2 times week alcohol   OB History   Grav Para Term Preterm Abortions TAB SAB Ect Mult Living                 Review of Systems  Constitutional: Negative for fever.  Respiratory: Positive for cough and shortness of breath. Negative for hemoptysis.   Gastrointestinal: Negative for abdominal pain.  All other systems reviewed and are negative.    Allergies  Codeine  Home Medications   Current Outpatient Rx  Name  Route  Sig  Dispense  Refill  . albuterol (PROVENTIL HFA;VENTOLIN HFA) 108 (90 BASE) MCG/ACT inhaler   Inhalation   Inhale 2 puffs into the lungs every 6 (six) hours as needed for wheezing.   1 Inhaler   3   . amitriptyline (ELAVIL) 25 MG tablet   Oral   Take 25 mg by mouth at bedtime.         Marland Kitchen amLODipine (NORVASC) 5 MG tablet   Oral   Take 2.5 mg by mouth daily.          Marland Kitchen aspirin 81 MG tablet   Oral   Take 81 mg by mouth daily.         . benzonatate (TESSALON) 100 MG capsule   Oral   Take 1 capsule (100 mg total) by mouth 3 (three) times daily as needed for cough.   20 capsule   5   . carvedilol (COREG) 6.25 MG tablet   Oral   Take 3.125 mg by mouth 2 (two) times daily with a meal.          . Cyanocobalamin (VITAMIN B-12 IJ)   Injection   Inject 1,000 mcg/mL as directed every 30 (thirty) days.         . cycloSPORINE (RESTASIS) 0.05 % ophthalmic emulsion   Both Eyes   Place 1 drop into both eyes daily.         Marland Kitchen donepezil (ARICEPT) 10 MG tablet   Oral   Take 10 mg by mouth at bedtime.         . Fluticasone-Salmeterol (ADVAIR DISKUS) 250-50 MCG/DOSE  AEPB   Inhalation   Inhale 1 puff into the lungs 2 (two) times daily.   60 each    3   . hydrocortisone (ANUSOL-HC) 25 MG suppository   Rectal   Place 25 mg rectally 2 (two) times daily as needed for hemorrhoids.         Marland Kitchen ibuprofen (ADVIL,MOTRIN) 800 MG tablet   Oral   Take 800 mg by mouth every 8 (eight) hours as needed for pain.         Marland Kitchen levothyroxine (SYNTHROID, LEVOTHROID) 112 MCG tablet   Oral   Take 112 mcg by mouth daily.         . mesalamine (LIALDA) 1.2 G EC tablet   Oral   Take 1,200 mg by mouth daily with breakfast.          . nitroGLYCERIN (NITROSTAT) 0.4 MG SL tablet   Sublingual   Place 0.4 mg under the tongue every 5 (five) minutes as needed. For chest pain         . pantoprazole (PROTONIX) 40 MG tablet   Oral   Take 40 mg by mouth daily.         . ramipril (ALTACE) 10 MG capsule   Oral   Take 10 mg by mouth daily.         . Red Yeast Rice 600 MG CAPS   Oral   Take 2 capsules by mouth at bedtime.         . celecoxib (CELEBREX) 200 MG capsule   Oral   Take 200 mg by mouth daily.          BP 159/72  Pulse 92  Temp(Src) 97.6 F (36.4 C) (Oral)  Resp 27  SpO2 98% Physical Exam  Nursing note and vitals reviewed. Constitutional: She is oriented to person, place, and time. She appears well-developed and well-nourished. No distress.  HENT:  Head: Normocephalic and atraumatic.  Mouth/Throat: Oropharynx is clear and moist.  Neck: Normal range of motion. Neck supple.  Cardiovascular: Normal rate and regular rhythm.  Exam reveals no gallop and no friction rub.   No murmur heard. Pulmonary/Chest: Effort normal and breath sounds normal. No respiratory distress. She has no wheezes.  Abdominal: Soft. Bowel sounds are normal. She exhibits no distension. There is no tenderness.  Musculoskeletal: Normal range of motion. She exhibits no edema.  Neurological: She is alert and oriented to person, place, and time.  Skin: Skin is warm and dry. She is not diaphoretic.    ED Course  Procedures (including critical care  time) Labs Review Labs Reviewed  CBC - Abnormal; Notable for the following:    RDW 15.8 (*)    All other components within normal limits  BASIC METABOLIC PANEL  PRO B NATRIURETIC PEPTIDE  TROPONIN I  POCT I-STAT TROPONIN I   Imaging Review No results found.  EKG Interpretation     Ventricular Rate:  88 PR Interval:  138 QRS Duration: 83 QT Interval:  320 QTC Calculation: 387 R Axis:   75 Text Interpretation:  Sinus or ectopic atrial rhythm Minimal ST depression, anterolateral leads ST elevation, consider inferior injury            MDM  No diagnosis found. Patient is an 79 year old female presents to the ER with a three-day history of worsening shortness of breath. She has a history of COPD and lung cancer for which she is being treated with radiation.  On exam her vitals are stable and oxygen  saturations are in the mid to lower 90s on room air. Physical examination reveals bilateral breath sounds to be present with no rales or wheezes. She was given a breathing treatment and oxygen and is feeling better. Workup reveals a negative troponin and unremarkable laboratory studies. Her EKG reveals a sinus rhythm which is unchanged from prior studies. As I did not have a better explanation for her dyspnea, I elected to proceed with a CT of the chest to rule out pulmonary embolism. This was performed and was negative for PE, however did reveal a 20-25% left-sided pneumothorax. I've spoken with pulmonary/critical care who will see the patient in the ER and determine the appropriate management of this pneumothorax.    Geoffery Lyons, MD 04/24/13 1539

## 2013-04-24 NOTE — ED Notes (Signed)
Patient states that she has been dizzy. Daughter arrived and said that patient actually fell 2 days ago. Orthostatics done and patient shaky and dizzy.

## 2013-04-24 NOTE — ED Notes (Signed)
Left chest tube inserrted

## 2013-04-24 NOTE — ED Notes (Signed)
Patient has lung cancer finished up radiation about 5months ago.No chemo no surgery b/c she did not qualify. Has been on prednisone b/c  She had fluid in left lower lung. Prednisone weaned off this past Thursday.

## 2013-04-24 NOTE — H&P (Signed)
PULMONARY  / CRITICAL CARE MEDICINE  Name: Jodi Frazier MRN: [id number] DOB: 02-25-1931    ADMISSION DATE:  04/24/2013   REFERRING MD :  EDP PRIMARY SERVICE: PCCM  CHIEF COMPLAINT:  Dyspnea and dizziness  BRIEF PATIENT DESCRIPTION:  79 year old female with lung cancer and COPD admitted for left pneumothorax spontaneous  SIGNIFICANT EVENTS / STUDIES:  04/24/2013: CT scan of chest shows 20% left pneumothorax with persistent lung mass,   LINES / TUBES: PIV  CULTURES: Sputum culture 04/24/2013:  ANTIBIOTICS: None  HISTORY OF PRESENT ILLNESS:   This is an 79 year old female who has stage IIIB lung carcinoma status post radiotherapy for same. She had radiation therapy to a left lower lobe lesion which was adenocarcinoma. She was diagnosed in March 2014 and received radiation between March and May of 2014. She has advanced chronic obstructive lung disease. There was some shrinkage in the left lower lobe mass in August 2014 film but was a groundglass infiltrate seen in left lower lobe she was started on corticosteroids do to the fact that there was a census was radiation pneumonitis. She saw pulmonary on 04/08/2013 and had persistent pneumonitis and had prednisone tapered further. She's had more coughing as well with normal productive mucus. She has had progressive shortness of breath as well. Patient is on Advair as well.   The patient is continuing with shortness breath and wheezing and brought to the emergency room with episode of dizziness and presyncope.     PAST MEDICAL HISTORY :  Past Medical History  Diagnosis Date  . Hypertension   . Colitis   . Coronary artery disease   . High cholesterol     takes Red Yeast Rice and Fish OIl daily  . Kidney stones   . PONV (postoperative nausea and vomiting)   . MI (myocardial infarction) 2010  . Shortness of breath     with exertion  . Pneumonia     hx of 20+yrs ago  . COPD (chronic obstructive pulmonary disease)      pleurisy or COPD exascerbation > 52yr ago  . Headache(784.0)   . Vertigo     HTN related bc gets up too fast  . Arthritis   . Bruises easily   . History of shingles 51yrs ago    in eye;occasionally gets some residual   . Accessory skin tags     arms/legs  . GERD (gastroesophageal reflux disease)   . Colitis   . Diverticulosis   . Urinary frequency   . Stress incontinence   . History of kidney stones   . Hypothyroidism     takes sYnthroid daily  . Cataract     early stage on right  . Macular degeneration, wet   . Depression     some but takes Amitryptylline nightly  . Right-sided chest wall pain 08/28/12    PRESENTATION - RIGHT SIDED ANTERIOR CHEST PAIN  . Weight loss 08/28/12    10 LB WEIGHT LOSS OVER 3 MONTHS  . Lung mass 08/12/12    CHEST-XRAY/ PET -LOBULAR MASS LLL - 4.1 X 3.6 X 4.2 CM  . Adenocarcinoma of lung 09/07/12    needle core bx-LLL-adenocarcinoma  . Dementia     mild  . S/P radiation therapy  10/05/2012-11/20/2012    Left Lower Lung and hilum / 70 Gy in 35 fractions   Past Surgical History  Procedure Laterality Date  . Hip arthroscopy  26yr ago    right hip-replacement  . Knee arthroscopy  LT  . Total knee arthroplasty  3yrs ago    left  . Appendectomy  12yrs ago  . Abdominal hysterectomy  44yrs ago   . Lithotripsy  2011  . Cataract surgery  2012    left  . Eye surgery  73yrs ago  . Bladder surgery  2001    tacked  . Coronary angioplasty with stent placement  2010    2 in rt coronary artery and 1 in another spot  . Colonoscopy    . Esophagogastroduodenoscopy    . Total knee arthroplasty  09/09/2011    Procedure: TOTAL KNEE ARTHROPLASTY;  Surgeon: Raymon Mutton, MD;  Location: [facility] OR;  Service: Orthopedics;  Laterality: Right;  Right Total Knee Arthroplasty  . Bronchial brush biopsy Left 08/17/12    LLL Bronchial Washing / Brushing and Bronchial Biopsy: Negative for malignancy  . Left lower lobe needle core biopsy Left 09/07/12    Poorly  Differentiated Adenocarcinoma   . Joint replacement     Prior to Admission medications   Medication Sig Start Date End Date Taking? Authorizing Provider  albuterol (PROVENTIL HFA;VENTOLIN HFA) 108 (90 BASE) MCG/ACT inhaler Inhale 2 puffs into the lungs every 6 (six) hours as needed for wheezing. 02/08/13  Yes Lonia Farber, MD  amitriptyline (ELAVIL) 25 MG tablet Take 25 mg by mouth at bedtime.   Yes Historical Provider, MD  amLODipine (NORVASC) 5 MG tablet Take 2.5 mg by mouth every morning.    Yes Historical Provider, MD  aspirin 81 MG tablet Take 81 mg by mouth every evening.    Yes Historical Provider, MD  benzonatate (TESSALON) 100 MG capsule Take 1 capsule (100 mg total) by mouth 3 (three) times daily as needed for cough. 11/02/12  Yes Lonie Peak, MD  carvedilol (COREG) 6.25 MG tablet Take 3.125 mg by mouth 2 (two) times daily with a meal.    Yes Historical Provider, MD  celecoxib (CELEBREX) 200 MG capsule Take 200 mg by mouth daily.   Yes Historical Provider, MD  Cyanocobalamin (VITAMIN B-12 IJ) Inject 1,000 mcg/mL as directed every 30 (thirty) days.   Yes Historical Provider, MD  cycloSPORINE (RESTASIS) 0.05 % ophthalmic emulsion Place 1 drop into both eyes daily.   Yes Historical Provider, MD  donepezil (ARICEPT) 10 MG tablet Take 10 mg by mouth at bedtime.   Yes Historical Provider, MD  Fluticasone-Salmeterol (ADVAIR DISKUS) 250-50 MCG/DOSE AEPB Inhale 1 puff into the lungs 2 (two) times daily. 02/08/13  Yes Lonia Farber, MD  hydrocortisone (ANUSOL-HC) 25 MG suppository Place 25 mg rectally 2 (two) times daily as needed for hemorrhoids.   Yes Historical Provider, MD  ibuprofen (ADVIL,MOTRIN) 800 MG tablet Take 800 mg by mouth every 8 (eight) hours as needed for pain.   Yes Historical Provider, MD  levothyroxine (SYNTHROID, LEVOTHROID) 112 MCG tablet Take 112 mcg by mouth every morning.    Yes Historical Provider, MD  mesalamine (LIALDA) 1.2 G EC tablet Take 1,200 mg  by mouth daily with breakfast.    Yes Historical Provider, MD  naproxen sodium (ANAPROX) 220 MG tablet Take 220 mg by mouth 2 (two) times daily as needed (pain).   Yes Historical Provider, MD  nitroGLYCERIN (NITROSTAT) 0.4 MG SL tablet Place 0.4 mg under the tongue every 5 (five) minutes as needed for chest pain.    Yes Historical Provider, MD  pantoprazole (PROTONIX) 40 MG tablet Take 40 mg by mouth every morning.    Yes Historical Provider, MD  Red Yeast Rice  600 MG CAPS Take 2 capsules by mouth at bedtime.   Yes Historical Provider, MD   Allergies  Allergen Reactions  . Codeine Hives    FAMILY HISTORY:  Family History  Problem Relation Age of Onset  . Anesthesia problems Mother   . Hypotension Neg Hx   . Malignant hyperthermia Neg Hx   . Pseudochol deficiency Neg Hx   . Cancer Maternal Aunt     breast   SOCIAL HISTORY:  reports that she quit smoking about 10 years ago. Her smoking use included Cigarettes. She has a 40 pack-year smoking history. She has never used smokeless tobacco. She reports that she drinks alcohol. She reports that she does not use illicit drugs.  REVIEW OF SYSTEMS:   Pos in BOLD Constitutional:   No  weight loss, night sweats,  Fevers, chills, fatigue, lassitude. HEENT:   No headaches,  Difficulty swallowing,  Tooth/dental problems,  Sore throat,                No sneezing, itching, ear ache, nasal congestion, post nasal drip,   CV:   chest pain,  Orthopnea, PND, swelling in lower extremities, anasarca, dizziness, palpitations  GI  No heartburn, indigestion, abdominal pain, nausea, vomiting, diarrhea, change in bowel habits, loss of appetite  Resp: shortness of breath with exertion or at rest.  No excess mucus, no productive cough,   non-productive cough,  No coughing up of blood.  No change in color of mucus.  No wheezing.  No chest wall deformity  Skin: no rash or lesions.  GU: no dysuria, change in color of urine, no urgency or frequency.  No flank  pain.  MS:  No joint pain or swelling.  No decreased range of motion.  No back pain.  Psych:  No change in mood or affect. No depression or anxiety.  No memory loss.   SUBJECTIVE:  Patient with ongoing dyspnea VITAL SIGNS: Temp:  [97.6 F (36.4 C)-98.2 F (36.8 C)] 98.2 F (36.8 C) (10/25 1323) Pulse Rate:  [72-105] 72 (10/25 1323) Resp:  [25-28] 25 (10/25 1323) BP: (100-159)/(51-72) 130/67 mmHg (10/25 1323) SpO2:  [91 %-98 %] 97 % (10/25 1323) HEMODYNAMICS: Hemodynamically stable    VENTILATOR SETTINGS:   INTAKE / OUTPUT: Intake/Output   None     PHYSICAL EXAMINATION: General:  Elderly female in no acute distress  Neuro:  Awake and alert no focal deficits  HEENT:  Dry mucous membranes  Cardiovascular:  Regular rate and rhythm normal S1-S2 no S3 or S4 no murmur rub heave or gallop  Lungs:  Distant breath sounds  Abdomen:  Soft nontender no organomegaly  Musculoskeletal:  Full range of motion  Skin:  Clear  LABS:  CBC Recent Labs     04/24/13  1029  WBC  8.8  HGB  14.1  HCT  41.7  PLT  233   Coag's No results found for this basename: APTT, INR,  in the last 72 hours BMET Recent Labs     04/24/13  1029  NA  132*  K  3.9  CL  96  CO2  24  BUN  17  CREATININE  1.05  GLUCOSE  124*   Electrolytes Recent Labs     04/24/13  1029  CALCIUM  10.3   Sepsis Markers No results found for this basename: LACTICACIDVEN, PROCALCITON, O2SATVEN,  in the last 72 hours ABG No results found for this basename: PHART, PCO2ART, PO2ART,  in the last 72 hours Liver Enzymes  No results found for this basename: AST, ALT, ALKPHOS, BILITOT, ALBUMIN,  in the last 72 hours Cardiac Enzymes Recent Labs     04/24/13  1029  TROPONINI  <0.30  PROBNP  435.7   Glucose No results found for this basename: GLUCAP,  in the last 72 hours  Imaging Dg Chest 2 View  04/24/2013   CLINICAL DATA:  Lung cancer, radiation therapy last week  EXAM: CHEST  2 VIEW  COMPARISON:  09/07/2012  plain film, 02/22/2013 CT scan  FINDINGS: Heart size and vascular pattern are normal. Right lung essentially clear except for mild patchy upper lobe airspace opacity. On the left, there is perihilar opacity representing a combination of known perihilar mass and airspace opacity consistent with radiation pneumonitis. There is a small left pleural effusion.  IMPRESSION: Known left perihilar mass, with complex airspace disease in the left perihilar and infrahilar region consistent with radiation pneumonitis. Small left pleural effusion. Mild patchy airspace disease laterally in the right upper lobe, nonspecific and similar to what was seen and described on 02/22/2013 CT scan. This is new when compared to 09/07/2012 chest radiograph. Progressive upper lobe scarring, pneumonitis, or developing metastatic disease or all possibilities.   Electronically Signed   By: Esperanza Heir M.D.   On: 04/24/2013 11:30   Ct Angio Chest Pe W/cm &/or Wo Cm  04/24/2013   CLINICAL DATA:  Possible pulmonary embolus, lung cancer, shortness of breath  EXAM: CT ANGIOGRAPHY CHEST WITH CONTRAST  TECHNIQUE: Multidetector CT imaging of the chest was performed using the standard protocol during bolus administration of intravenous contrast. Multiplanar CT image reconstructions including MIPs were obtained to evaluate the vascular anatomy.  CONTRAST:  OMNIPAQUE IOHEXOL 350 MG/ML SOLN  COMPARISON:  02/22/2013  FINDINGS: Sagittal images of the spine shows osteopenia and degenerative changes. Mild multilevel anterior spurring. No destructive bony lesions are noted.  Central airways are patent. Bilateral apical pleural parenchymal scarring. Bilateral mild emphysematous changes upper lobe.  A pretracheal lymph node axial image 24 measures 5.5 mm. On the prior exam measures 7.4 mm. Precarinal lymph node measures 9 mm short-axis on the prior exam measured 11 mm in short axis. Atherosclerotic calcifications of thoracic aorta again noted. The  study is of excellent technical quality. No pulmonary embolus is identified.  Heart size is stable. Trace anterior pericardial effusion.  Central airways are patent. Again noted a spiculated nodular mass in left lower lobe measures 2.7 by 2.2 cm stable in size in appearance from prior exam there is small left pleural effusion increased in size from prior exam. There is patchy airspace is left perihilar region and left lower lobe infrahilar. Some central peribronchial thickening noted. Findings may be due to worsening postradiation pneumonitis. There is worsening bronchiectasis in lingula. Some bronchiectasis and patchy airspace is probable postradiation noted in right middle lobe.  There is about 20-25% left anterior pneumothorax. Small amount of lower mediastinal/ diaphragmatic air is noted see axial image 80.  There is a spiculated nodule in right upper lobe laterally measures 9 mm. Metastatic disease cannot be excluded. Follow-up examination  Review of the MIP images confirms the above findings.  There is a spiculated nodule in right upper lobe laterally axial image 28 measures about 9.5 mm. Metastatic disease cannot be excluded. Further correlation with PET scan is recommended.  IMPRESSION: 1. No pulmonary embolus is noted. 2. Stable mass in left lower lobe. There is worsening pneumonia or postradiation pneumonitis left perihilar region and left infrahilar region. Patchy pneumonitis is  noted in right middle lobe. There is some apneumonitis with bronchiectasis in the lingula. Small left pleural effusion increased in size from prior exam. 3. Bilateral upper lobe pleural parenchymal scarring. There is a spiculated nodule in right upper lobe laterally measures about 9.5 mm. Metastatic disease cannot be excluded. Further correlation with PET scan is recommended. 4. There is about 20-25% left anterior pneumothorax. Small amount of left lower mediastinal/diaphragmatic air is noted. 5. Critical Value/emergent results  were called by telephone at the time of interpretation on 04/24/2013 at 1:02 PM to [facility] , who verbally acknowledged these results.   Electronically Signed   By: Natasha Mead M.D.   On: 04/24/2013 13:03     CXR: 04/24/2013: Evidence for pneumothorax on the left CT scan the chest 04/24/2013: Evidence for 25% left pneumothorax anterior  ASSESSMENT / PLAN:  PULMONARY A:Adenocarcinoma left lower lobe status post radiation to lesion with now spontaneous pneumothorax on the left with COPD and emphysematous change  P:   IV steroids Bronchodilator therapy Place chest  Admit to step down unit  CARDIOVASCULAR A: No active issues  P:  Monitor  RENAL A:  No active issues  P:   Monitor  GASTROINTESTINAL A:  No active issues  P:   Regular diet  HEMATOLOGIC A:  No active issues  P:  Monitor  INFECTIOUS A:  Groundglass infiltrate left lower lobe doubt infection suspect radiation pneumonitis  P:   Check sputum culture and hold on antibiotics  ENDOCRINE A:  No active issues and will monitor   P:   Monitor  NEUROLOGIC A:  No acute issues  P:   Monitor  TODAY'S SUMMARY:  79 year old female with adenocarcinoma left lower lobe now spontaneous pneumothorax. Plan is to admit to step down unit in place A. Effie Berkshire catheter for chest tube placement  I have personally obtained a history, examined the patient, evaluated laboratory and imaging results, formulated the assessment and plan and placed orders.  Dorcas Carrow Beeper  [phone]  Cell  [phone]  If no response or cell goes to voicemail, call beeper [phone]  Pulmonary and Critical Care Medicine [facility] Pager: [phone]  04/24/2013, 1:51 PM

## 2013-04-25 ENCOUNTER — Inpatient Hospital Stay (HOSPITAL_COMMUNITY): Payer: Medicare Other

## 2013-04-25 ENCOUNTER — Encounter (HOSPITAL_COMMUNITY): Payer: Self-pay | Admitting: Critical Care Medicine

## 2013-04-25 DIAGNOSIS — J700 Acute pulmonary manifestations due to radiation: Secondary | ICD-10-CM

## 2013-04-25 DIAGNOSIS — F0390 Unspecified dementia without behavioral disturbance: Secondary | ICD-10-CM | POA: Insufficient documentation

## 2013-04-25 DIAGNOSIS — C3490 Malignant neoplasm of unspecified part of unspecified bronchus or lung: Secondary | ICD-10-CM

## 2013-04-25 LAB — BASIC METABOLIC PANEL
BUN: 22 mg/dL (ref 6–23)
Calcium: 10.2 mg/dL (ref 8.4–10.5)
GFR calc Af Amer: 59 mL/min — ABNORMAL LOW (ref >90)
GFR calc non Af Amer: 51 mL/min — ABNORMAL LOW (ref >90)
Glucose, Bld: 197 mg/dL — ABNORMAL HIGH (ref 70–99)
Potassium: 4.3 mEq/L (ref 3.5–5.1)
Sodium: 131 mEq/L — ABNORMAL LOW (ref 135–145)

## 2013-04-25 LAB — CBC
HCT: 36.3 % (ref 36.0–46.0)
Hemoglobin: 12.1 g/dL (ref 12.0–15.0)
MCHC: 33.3 g/dL (ref 30.0–36.0)
RDW: 15.5 % (ref 11.5–15.5)

## 2013-04-25 MED ORDER — PREDNISONE 20 MG PO TABS
40.0000 mg | ORAL_TABLET | Freq: Every day | ORAL | Status: DC
  Administered 2013-04-26 – 2013-04-27 (×2): 40 mg via ORAL
  Filled 2013-04-25 (×4): qty 2

## 2013-04-25 MED ORDER — CARVEDILOL 6.25 MG PO TABS
6.2500 mg | ORAL_TABLET | Freq: Two times a day (BID) | ORAL | Status: DC
  Administered 2013-04-25 – 2013-04-27 (×4): 6.25 mg via ORAL
  Filled 2013-04-25 (×6): qty 1

## 2013-04-25 MED ORDER — AMLODIPINE BESYLATE 5 MG PO TABS
5.0000 mg | ORAL_TABLET | Freq: Every morning | ORAL | Status: DC
  Administered 2013-04-26 – 2013-04-27 (×2): 5 mg via ORAL
  Filled 2013-04-25 (×3): qty 1

## 2013-04-25 MED ORDER — ALBUTEROL SULFATE (5 MG/ML) 0.5% IN NEBU: 2.5000 mg | INHALATION_SOLUTION | RESPIRATORY_TRACT | Status: DC | PRN

## 2013-04-25 MED ORDER — LIP MEDEX EX OINT
TOPICAL_OINTMENT | CUTANEOUS | Status: AC
  Administered 2013-04-25: 17:00:00
  Filled 2013-04-25: qty 7

## 2013-04-25 NOTE — Progress Notes (Addendum)
PULMONARY  / CRITICAL CARE MEDICINE  Name: Jodi Frazier MRN: [id number] DOB: 1930-07-11    ADMISSION DATE:  04/24/2013   REFERRING MD :  EDP PRIMARY SERVICE: PCCM  CHIEF COMPLAINT:  Dyspnea and dizziness  BRIEF PATIENT DESCRIPTION:  79 year old female with lung cancer and COPD admitted for left pneumothorax spontaneous  SIGNIFICANT EVENTS / STUDIES:  04/24/2013: CT scan of chest shows 20% left pneumothorax with persistent lung mass,   LINES / TUBES: PIV  CULTURES: Sputum culture 04/24/2013:  ANTIBIOTICS: None  SUBJECTIVE:  Pt much improved.    VITAL SIGNS: Temp:  [96.3 F (35.7 C)-98.2 F (36.8 C)] 97.4 F (36.3 C) (10/26 0800) Pulse Rate:  [61-105] 70 (10/26 0900) Resp:  [13-28] 17 (10/26 0900) BP: (100-164)/(48-91) 160/61 mmHg (10/26 0900) SpO2:  [89 %-100 %] 95 % (10/26 0905) Weight:  [81.8 kg (180 lb 5.4 oz)-81.9 kg (180 lb 8.9 oz)] 81.9 kg (180 lb 8.9 oz) (10/26 0400) HEMODYNAMICS: Hemodynamically stable    ON RA    INTAKE / OUTPUT: Intake/Output     10/25 0701 - 10/26 0700 10/26 0701 - 10/27 0700   I.V. (mL/kg) 800 (9.8) 50 (0.6)   Total Intake(mL/kg) 800 (9.8) 50 (0.6)   Urine (mL/kg/hr) 300    Chest Tube 316    Total Output 616     Net +184 +50        Urine Occurrence 1 x    Stool Occurrence       PHYSICAL EXAMINATION: General:  Elderly female in no acute distress  Neuro:  Awake and alert no focal deficits  HEENT:  Dry mucous membranes  Cardiovascular:  Regular rate and rhythm normal S1-S2 no S3 or S4 no murmur rub heave or gallop  Lungs:  Distant breath sounds , CT in place on L, NO AIR LEAK Abdomen:  Soft nontender no organomegaly  Musculoskeletal:  Full range of motion  Skin:  Clear  LABS:  CBC Recent Labs     04/24/13  1029  04/25/13  0345  WBC  8.8  7.3  HGB  14.1  12.1  HCT  41.7  36.3  PLT  233  201   Coag's No results found for this basename: APTT, INR,  in the last 72 hours BMET Recent Labs     04/24/13  1029   04/25/13  0345  NA  132*  131*  K  3.9  4.3  CL  96  99  CO2  24  24  BUN  17  22  CREATININE  1.05  1.02  GLUCOSE  124*  197*   Electrolytes Recent Labs     04/24/13  1029  04/25/13  0345  CALCIUM  10.3  10.2   Sepsis Markers No results found for this basename: LACTICACIDVEN, PROCALCITON, O2SATVEN,  in the last 72 hours ABG No results found for this basename: PHART, PCO2ART, PO2ART,  in the last 72 hours Liver Enzymes No results found for this basename: AST, ALT, ALKPHOS, BILITOT, ALBUMIN,  in the last 72 hours Cardiac Enzymes Recent Labs     04/24/13  1029  TROPONINI  <0.30  PROBNP  435.7   Glucose No results found for this basename: GLUCAP,  in the last 72 hours  Imaging Dg Chest 2 View  04/24/2013   CLINICAL DATA:  Lung cancer, radiation therapy last week  EXAM: CHEST  2 VIEW  COMPARISON:  09/07/2012 plain film, 02/22/2013 CT scan  FINDINGS: Heart size and vascular pattern are normal.  Right lung essentially clear except for mild patchy upper lobe airspace opacity. On the left, there is perihilar opacity representing a combination of known perihilar mass and airspace opacity consistent with radiation pneumonitis. There is a small left pleural effusion.  IMPRESSION: Known left perihilar mass, with complex airspace disease in the left perihilar and infrahilar region consistent with radiation pneumonitis. Small left pleural effusion. Mild patchy airspace disease laterally in the right upper lobe, nonspecific and similar to what was seen and described on 02/22/2013 CT scan. This is new when compared to 09/07/2012 chest radiograph. Progressive upper lobe scarring, pneumonitis, or developing metastatic disease or all possibilities.   Electronically Signed   By: Esperanza Heir M.D.   On: 04/24/2013 11:30   Ct Angio Chest Pe W/cm &/or Wo Cm  04/24/2013   CLINICAL DATA:  Possible pulmonary embolus, lung cancer, shortness of breath  EXAM: CT ANGIOGRAPHY CHEST WITH CONTRAST   TECHNIQUE: Multidetector CT imaging of the chest was performed using the standard protocol during bolus administration of intravenous contrast. Multiplanar CT image reconstructions including MIPs were obtained to evaluate the vascular anatomy.  CONTRAST:  OMNIPAQUE IOHEXOL 350 MG/ML SOLN  COMPARISON:  02/22/2013  FINDINGS: Sagittal images of the spine shows osteopenia and degenerative changes. Mild multilevel anterior spurring. No destructive bony lesions are noted.  Central airways are patent. Bilateral apical pleural parenchymal scarring. Bilateral mild emphysematous changes upper lobe.  A pretracheal lymph node axial image 24 measures 5.5 mm. On the prior exam measures 7.4 mm. Precarinal lymph node measures 9 mm short-axis on the prior exam measured 11 mm in short axis. Atherosclerotic calcifications of thoracic aorta again noted. The study is of excellent technical quality. No pulmonary embolus is identified.  Heart size is stable. Trace anterior pericardial effusion.  Central airways are patent. Again noted a spiculated nodular mass in left lower lobe measures 2.7 by 2.2 cm stable in size in appearance from prior exam there is small left pleural effusion increased in size from prior exam. There is patchy airspace is left perihilar region and left lower lobe infrahilar. Some central peribronchial thickening noted. Findings may be due to worsening postradiation pneumonitis. There is worsening bronchiectasis in lingula. Some bronchiectasis and patchy airspace is probable postradiation noted in right middle lobe.  There is about 20-25% left anterior pneumothorax. Small amount of lower mediastinal/ diaphragmatic air is noted see axial image 80.  There is a spiculated nodule in right upper lobe laterally measures 9 mm. Metastatic disease cannot be excluded. Follow-up examination  Review of the MIP images confirms the above findings.  There is a spiculated nodule in right upper lobe laterally axial image 28  measures about 9.5 mm. Metastatic disease cannot be excluded. Further correlation with PET scan is recommended.  IMPRESSION: 1. No pulmonary embolus is noted. 2. Stable mass in left lower lobe. There is worsening pneumonia or postradiation pneumonitis left perihilar region and left infrahilar region. Patchy pneumonitis is noted in right middle lobe. There is some apneumonitis with bronchiectasis in the lingula. Small left pleural effusion increased in size from prior exam. 3. Bilateral upper lobe pleural parenchymal scarring. There is a spiculated nodule in right upper lobe laterally measures about 9.5 mm. Metastatic disease cannot be excluded. Further correlation with PET scan is recommended. 4. There is about 20-25% left anterior pneumothorax. Small amount of left lower mediastinal/diaphragmatic air is noted. 5. Critical Value/emergent results were called by telephone at the time of interpretation on 04/24/2013 at 1:02 PM to [facility] ,  who verbally acknowledged these results.   Electronically Signed   By: Natasha Mead M.D.   On: 04/24/2013 13:03   Dg Chest Port 1 View  04/25/2013   *RADIOLOGY REPORT*  Clinical Data: Follow up pneumothorax.  Status post left-sided chest tube placement.  PORTABLE CHEST - 1 VIEW  Comparison: Chest radiograph performed 04/24/2013  Findings: The left-sided pneumothorax has resolved.  The left-sided chest drain is unchanged in position.  Left-sided airspace opacity is more prominent than on the prior study.  Minimal right basilar airspace opacity likely reflects atelectasis.  Vascular congestion is noted.  No pneumothorax is identified.  The left perihilar mass is grossly unchanged.  The mediastinum remains normal in size.  No acute osseous abnormalities are seen.  IMPRESSION:  1.  Left-sided pneumothorax has resolved.  Left-sided chest drain unchanged in position. 2.  Left-sided airspace opacity is more prominent than on the prior study, raising question for pneumonia. 3.   Vascular congestion noted; left perihilar mass again noted.   Original Report Authenticated By: Tonia Ghent, M.D.   Dg Chest Port 1 View  04/24/2013   CLINICAL DATA:  Left chest tube evaluate for pneumothorax  EXAM: PORTABLE CHEST - 1 VIEW  COMPARISON:  04/24/2013  FINDINGS: Pigtail left chest tube has been placed. Minimal left apical pneumothorax identified. Complex left perihilar mass and airspace disease stable. Known small nodular opacity right upper lobe stable.  IMPRESSION: Left perihilar mass and radiation pneumonitis. Tiny residual apical pneumothorax after left chest tube placement.   Electronically Signed   By: Esperanza Heir M.D.   On: 04/24/2013 14:46     CXR: 04/25/2013: resolved  pneumothorax on the left, CT in good position, LLL pneumonitis CT scan the chest 04/24/2013: Evidence for 25% left pneumothorax anterior  ASSESSMENT / PLAN: Principal Problem:   Pneumothorax on left Active Problems:   Adenocarcinoma of lung   Malignant neoplasm of lower lobe, bronchus, or lung   Radiation pneumonitis   COPD (chronic obstructive pulmonary disease)   HTN (hypertension)   PULMONARY A:Adenocarcinoma left lower lobe status post radiation to lesion with now spontaneous pneumothorax on the left with COPD and emphysematous change .  Radiation pneumonitis, plus COPD, plus lung Ca all lead to spont PTX with cough P:   Change to po steroids NO ABX Bronchodilator therapy Can transfer to floor if no PTX recurrence on water seal f/u cxr this PM  CARDIOVASCULAR A:HTN P:  Monitor Increase coreg and amlodipine Hold ramipril d/t coughing  RENAL A:  No active issues  P:   Monitor  GASTROINTESTINAL A:  No active issues  P:   Regular diet  HEMATOLOGIC A:  No active issues  P:  Monitor  INFECTIOUS A:  Groundglass infiltrate left lower lobe doubt infection suspect radiation pneumonitis  P:   hold on antibiotics  ENDOCRINE A:  No active issues and will monitor   P:    Monitor  NEUROLOGIC A:  Pain d/t Chest tube P:   Monitor Pain control  TODAY'S SUMMARY:  79 year old female with adenocarcinoma left lower lobe now spontaneous pneumothorax s/p wayne cook chest tube. Plan : place CT to water seal. F/u CXR and tfr to floor if Cxr ok at 3PM  I have personally obtained a history, examined the patient, evaluated laboratory and imaging results, formulated the assessment and plan and placed orders.  Dorcas Carrow Beeper  [phone]  Cell  [phone]  If no response or cell goes to voicemail, call beeper [phone]  Pulmonary and  Critical Care Medicine [facility] Pager: [phone]  04/25/2013, 10:03 AM

## 2013-04-26 ENCOUNTER — Inpatient Hospital Stay (HOSPITAL_COMMUNITY): Payer: Medicare Other

## 2013-04-26 LAB — CBC
HCT: 33.6 % — ABNORMAL LOW (ref 36.0–46.0)
MCHC: 34.2 g/dL (ref 30.0–36.0)
Platelets: 235 10*3/uL (ref 150–400)
RDW: 15.4 % (ref 11.5–15.5)
WBC: 14.7 10*3/uL — ABNORMAL HIGH (ref 4.0–10.5)

## 2013-04-26 LAB — BASIC METABOLIC PANEL
BUN: 25 mg/dL — ABNORMAL HIGH (ref 6–23)
Calcium: 9.3 mg/dL (ref 8.4–10.5)
Chloride: 105 mEq/L (ref 96–112)
Creatinine, Ser: 0.88 mg/dL (ref 0.50–1.10)
GFR calc Af Amer: 70 mL/min — ABNORMAL LOW (ref >90)
GFR calc non Af Amer: 60 mL/min — ABNORMAL LOW (ref >90)
Potassium: 4.3 mEq/L (ref 3.5–5.1)
Sodium: 137 mEq/L (ref 135–145)

## 2013-04-26 MED ORDER — BENZONATATE 100 MG PO CAPS
100.0000 mg | ORAL_CAPSULE | Freq: Three times a day (TID) | ORAL | Status: DC | PRN
  Administered 2013-04-26 – 2013-04-27 (×2): 100 mg via ORAL
  Filled 2013-04-26 (×2): qty 1

## 2013-04-26 NOTE — Significant Event (Signed)
Pt has cough.  She takes tessalon prn at home.  Will order tessalon 100 mg tid prn cough.  Coralyn Helling, MD 04/26/2013, 8:04 PM

## 2013-04-26 NOTE — Progress Notes (Signed)
PULMONARY  / CRITICAL CARE MEDICINE  Name: Jodi Frazier MRN: [id number] DOB: 1930/10/26    ADMISSION DATE:  04/24/2013   REFERRING MD :  EDP PRIMARY SERVICE: PCCM  CHIEF COMPLAINT:  Dyspnea and dizziness  BRIEF PATIENT DESCRIPTION:  79 year old female with lung cancer and COPD admitted for left pneumothorax spontaneous  SIGNIFICANT EVENTS / STUDIES:  04/24/2013: CT scan of chest shows 20% left pneumothorax with persistent lung mass,  10-27 ct removed LINES / TUBES: PIV 1-25 ct>>10-27 CULTURES: Sputum culture 04/24/2013:  ANTIBIOTICS: None  SUBJECTIVE:  Pt much improved.    VITAL SIGNS: Temp:  [97.5 F (36.4 C)] 97.5 F (36.4 C) (10/27 0529) Pulse Rate:  [70-79] 78 (10/27 0529) Resp:  [14-20] 16 (10/27 0529) BP: (146-178)/(69-75) 178/75 mmHg (10/27 0529) SpO2:  [92 %-97 %] 92 % (10/27 0811) Weight:  [174 lb 2.6 oz (79 kg)] 174 lb 2.6 oz (79 kg) (10/26 1603) HEMODYNAMICS: Hemodynamically stable    ON RA    INTAKE / OUTPUT: Intake/Output     10/26 0701 - 10/27 0700 10/27 0701 - 10/28 0700   P.O. 150    I.V. (mL/kg) 600 (7.6)    Total Intake(mL/kg) 750 (9.5)    Urine (mL/kg/hr) 250 (0.1)    Chest Tube     Total Output 250     Net +500          Urine Occurrence 2 x    Stool Occurrence 2 x      PHYSICAL EXAMINATION: General:  Elderly female in no acute distress  Neuro:  Awake and alert no focal deficits  HEENT:  Dry mucous membranes  Cardiovascular:  Regular rate and rhythm normal S1-S2 no S3 or S4 no murmur rub heave or gallop  Lungs:  Distant breath sounds , CT idc'd , NO AIR LEAK prior to removal Abdomen:  Soft nontender no organomegaly  Musculoskeletal:  Full range of motion  Skin:  Clear  LABS:  CBC Recent Labs     04/24/13  1029  04/25/13  0345  04/26/13  0540  WBC  8.8  7.3  14.7*  HGB  14.1  12.1  11.5*  HCT  41.7  36.3  33.6*  PLT  233  201  235   Coag's No results found for this basename: APTT, INR,  in the last 72  hours BMET Recent Labs     04/24/13  1029  04/25/13  0345  04/26/13  0540  NA  132*  131*  137  K  3.9  4.3  4.3  CL  96  99  105  CO2  24  24  23   BUN  17  22  25*  CREATININE  1.05  1.02  0.88  GLUCOSE  124*  197*  145*   Electrolytes Recent Labs     04/24/13  1029  04/25/13  0345  04/26/13  0540  CALCIUM  10.3  10.2  9.3   Sepsis Markers No results found for this basename: LACTICACIDVEN, PROCALCITON, O2SATVEN,  in the last 72 hours ABG No results found for this basename: PHART, PCO2ART, PO2ART,  in the last 72 hours Liver Enzymes No results found for this basename: AST, ALT, ALKPHOS, BILITOT, ALBUMIN,  in the last 72 hours Cardiac Enzymes Recent Labs     04/24/13  1029  TROPONINI  <0.30  PROBNP  435.7   Glucose No results found for this basename: GLUCAP,  in the last 72 hours  Imaging Dg Chest [facility]  1 View  04/26/2013   CLINICAL DATA:  Pneumothorax, left chest tube.  EXAM: PORTABLE CHEST - 1 VIEW  COMPARISON:  Chest radiograph April 25, 2013  FINDINGS: Left pigtail chest tube remains in place, with retaining loop projecting within the chest wall. No pneumothorax. Left apical pleural thickening persist. Patchy left perihilar and to lesser extent left lung base airspace opacities persist in a background of chronic interstitial changes.  Cardiac silhouette appears upper limits of normal, moderately calcified aortic knob. Soft tissue planes and included osseous structures are nonsuspicious. Multiple EKG lines overlie the patient and may obscure subtle underlying pathology.  IMPRESSION: The left chest tube, no pneumothorax.  Persisting consolidations in left lung, in a background of chronic interstitial changes.   Electronically Signed   By: Awilda Metro   On: 04/26/2013 06:18   Dg Chest Port 1 View  04/25/2013   CLINICAL DATA:  Recent pneumothorax  EXAM: PORTABLE CHEST - 1 VIEW  COMPARISON:  Study obtained earlier in the day  FINDINGS: Chest drain present on left.  There is consolidation in the left mid lung, stable. There is currently no pneumothorax. There is No new opacity. There is a degree of underlying emphysema. Heart size is normal. Pulmonary vascularity reflects underlying emphysema.  IMPRESSION: Persistent consolidation left mid lung. Underlying emphysema. No new opacity. No pneumothorax apparent.   Electronically Signed   By: Bretta Bang M.D.   On: 04/25/2013 15:08   Dg Chest Port 1 View  04/25/2013   *RADIOLOGY REPORT*  Clinical Data: Follow up pneumothorax.  Status post left-sided chest tube placement.  PORTABLE CHEST - 1 VIEW  Comparison: Chest radiograph performed 04/24/2013  Findings: The left-sided pneumothorax has resolved.  The left-sided chest drain is unchanged in position.  Left-sided airspace opacity is more prominent than on the prior study.  Minimal right basilar airspace opacity likely reflects atelectasis.  Vascular congestion is noted.  No pneumothorax is identified.  The left perihilar mass is grossly unchanged.  The mediastinum remains normal in size.  No acute osseous abnormalities are seen.  IMPRESSION:  1.  Left-sided pneumothorax has resolved.  Left-sided chest drain unchanged in position. 2.  Left-sided airspace opacity is more prominent than on the prior study, raising question for pneumonia. 3.  Vascular congestion noted; left perihilar mass again noted.   Original Report Authenticated By: Tonia Ghent, M.D.   Dg Chest Port 1 View  04/24/2013   CLINICAL DATA:  Left chest tube evaluate for pneumothorax  EXAM: PORTABLE CHEST - 1 VIEW  COMPARISON:  04/24/2013  FINDINGS: Pigtail left chest tube has been placed. Minimal left apical pneumothorax identified. Complex left perihilar mass and airspace disease stable. Known small nodular opacity right upper lobe stable.  IMPRESSION: Left perihilar mass and radiation pneumonitis. Tiny residual apical pneumothorax after left chest tube placement.   Electronically Signed   By: Esperanza Heir M.D.   On: 04/24/2013 14:46       ASSESSMENT / PLAN: Principal Problem:   Pneumothorax on left Active Problems:   Adenocarcinoma of lung   Malignant neoplasm of lower lobe, bronchus, or lung   Radiation pneumonitis   COPD (chronic obstructive pulmonary disease)   HTN (hypertension)   Hypothyroidism   PULMONARY A:Adenocarcinoma left lower lobe status post radiation to lesion with now spontaneous pneumothorax on the left with COPD and emphysematous change .  Radiation pneumonitis, plus COPD, plus lung Ca all lead to spont PTX with cough P:   Change to po steroids NO  ABX Bronchodilator therapy 10-27 ct dc'd , c x r for 1500 and in am if no pnx then dc home soon.  CARDIOVASCULAR A:HTN P:  Monitor Increase coreg and amlodipine Hold ramipril d/t coughing  RENAL A:  No active issues  P:   Monitor  GASTROINTESTINAL A:  No active issues  P:   Regular diet  HEMATOLOGIC A:  No active issues  P:  Monitor  INFECTIOUS A:  Groundglass infiltrate left lower lobe doubt infection suspect radiation pneumonitis  P:   hold on antibiotics  ENDOCRINE A:  No active issues and will monitor   P:   Monitor  NEUROLOGIC A:  Pain d/t Chest tube P:   Monitor Pain control  TODAY'S SUMMARY:  79 year old female with adenocarcinoma left lower lobe now spontaneous pneumothorax s/p wayne cook chest tube. Plan : place CT to water seal. F/u CXR and tfr to floor 10-26. 10-27 dc ct and check cxr 1500 hrs.   Brett Canales Minor ACNP [facility] Pager [phone] till 3 pm If no answer page [phone] 04/26/2013, 1:03 PM  Levy Pupa, MD, PhD 04/26/2013, 1:19 PM [place] Pulmonary and Critical Care [phone] or if no answer [phone]

## 2013-04-27 ENCOUNTER — Inpatient Hospital Stay (HOSPITAL_COMMUNITY): Payer: Medicare Other

## 2013-04-27 DIAGNOSIS — J449 Chronic obstructive pulmonary disease, unspecified: Secondary | ICD-10-CM

## 2013-04-27 MED ORDER — PREDNISONE 10 MG PO TABS: ORAL_TABLET | ORAL | Status: DC

## 2013-04-27 NOTE — Discharge Summary (Signed)
Physician Discharge Summary  Patient ID: Jodi Frazier MRN: [id number] DOB/AGE: 07/23/1930 79 y.o.  Admit date: 04/24/2013 Discharge date: 04/27/2013  Problem List Principal Problem:   Pneumothorax on left Active Problems:   Adenocarcinoma of lung   Malignant neoplasm of lower lobe, bronchus, or lung   Radiation pneumonitis   COPD (chronic obstructive pulmonary disease)   HTN (hypertension)   Hypothyroidism  HPI: This is an 79 year old female who has stage IIIB lung carcinoma status post radiotherapy for same. She had radiation therapy to a left lower lobe lesion which was adenocarcinoma. She was diagnosed in March 2014 and received radiation between March and May of 2014. She has advanced chronic obstructive lung disease. There was some shrinkage in the left lower lobe mass in August 2014 film but was a groundglass infiltrate seen in left lower lobe she was started on corticosteroids do to the fact that there was a census was radiation pneumonitis. She saw pulmonary on 04/08/2013 and had persistent pneumonitis and had prednisone tapered further. She's had more coughing as well with normal productive mucus. She has had progressive shortness of breath as well. Patient is on Advair as well.  The patient is continuing with shortness breath and wheezing and brought to the emergency room with episode of dizziness and presyncope.  Hospital Course: BRIEF PATIENT DESCRIPTION:  79 year old female with lung cancer and COPD admitted for left pneumothorax spontaneous  SIGNIFICANT EVENTS / STUDIES:  04/24/2013: CT scan of chest shows 20% left pneumothorax with persistent lung mass,  10-27 ct removed  LINES / TUBES:  PIV  1-25 ct>>10-27  CULTURES:  Sputum culture 04/24/2013:  ANTIBIOTICS:  None  SUBJECTIVE:  Pt much improved.   ASSESSMENT / PLAN:  Principal Problem:  Pneumothorax on left  Active Problems:  Adenocarcinoma of lung  Malignant neoplasm of lower lobe, bronchus, or lung   Radiation pneumonitis  COPD (chronic obstructive pulmonary disease)  HTN (hypertension)  Hypothyroidism   PULMONARY  A:Adenocarcinoma left lower lobe status post radiation to lesion with now spontaneous pneumothorax on the left with COPD and emphysematous change .  Radiation pneumonitis, plus COPD, plus lung Ca all lead to spont PTX with cough  P:  Change to po steroids  NO ABX  Bronchodilator therapy  10-27 ct dc'd , c x r for 1500 and in am if no pnx then dc 10-28  No pnx dc home now.  CARDIOVASCULAR  A:HTN  P:  Monitor  Increase coreg and amlodipine  Hold ramipril d/t coughing  RENAL  A: No active issues  P:  Monitor  GASTROINTESTINAL  A: No active issues  P:  Regular diet  HEMATOLOGIC  A: No active issues  P:  Monitor  INFECTIOUS  A: Groundglass infiltrate left lower lobe doubt infection suspect radiation pneumonitis  P:  hold on antibiotics  ENDOCRINE  A: No active issues and will monitor  P:  Monitor  NEUROLOGIC  A: Pain d/t Chest tube (resolved with chest tube out.) P:  Monitor  Pain control  TODAY'S SUMMARY:  79 year old female with adenocarcinoma left lower lobe now spontaneous pneumothorax s/p wayne cook chest tube. Plan : CT out no pnx on cxr therefore dc home..    Labs at discharge Lab Results  Component Value Date   CREATININE 0.88 04/26/2013   BUN 25* 04/26/2013   NA 137 04/26/2013   K 4.3 04/26/2013   CL 105 04/26/2013   CO2 23 04/26/2013   Lab Results  Component Value Date   WBC 14.7*  04/26/2013   HGB 11.5* 04/26/2013   HCT 33.6* 04/26/2013   MCV 86.4 04/26/2013   PLT 235 04/26/2013   Lab Results  Component Value Date   ALT 21 08/29/2011   AST 21 08/29/2011   ALKPHOS 119* 08/29/2011   BILITOT 0.3 08/29/2011   Lab Results  Component Value Date   INR 1.00 09/07/2012   INR 0.95 08/29/2011    Current radiology studies Dg Chest Port 1 View  04/27/2013   CLINICAL DATA:  Chest tube removed  EXAM: PORTABLE CHEST - 1 VIEW   COMPARISON:  04/26/2013  FINDINGS: Stable cardiac silhouette. Volume loss in the left hemi thorax. No evidence of pneumothorax following chest tube removal. Chronic interstitial thickening in the left and right lung is unchanged. Left perihilar density unchanged.  IMPRESSION: No interval change. No pneumothorax.   Electronically Signed   By: Genevive Bi M.D.   On: 04/27/2013 07:15   Dg Chest Port 1 View  04/26/2013   CLINICAL DATA:  Status post left-sided chest tube removal. Minor chest pain. Shortness of breath and cough.  EXAM: PORTABLE CHEST - 1 VIEW  COMPARISON:  CHEST x-ray 04/26/2013.  FINDINGS: Previously noted small bore left-sided chest tube has been removed. No appreciable pneumothorax identified. Patchy multifocal ill-defined opacities in the left mid lung correspond to areas of airspace consolidation noted on prior chest CT 02/22/2013. These may be post radiation changes. No new acute consolidative airspace disease. No per definite pleural effusions. No evidence of pulmonary edema. Heart size is normal. Architectural distortion in the left hilar region. Mediastinal contours are otherwise unremarkable. Atherosclerosis in the thoracic aorta.  IMPRESSION: 1. Status post removal of left-sided chest tube without pneumothorax or significant change in the radiographic appearance of the chest, as discussed above.   Electronically Signed   By: Trudie Reed M.D.   On: 04/26/2013 15:31   Dg Chest Port 1 View  04/26/2013   CLINICAL DATA:  Pneumothorax, left chest tube.  EXAM: PORTABLE CHEST - 1 VIEW  COMPARISON:  Chest radiograph April 25, 2013  FINDINGS: Left pigtail chest tube remains in place, with retaining loop projecting within the chest wall. No pneumothorax. Left apical pleural thickening persist. Patchy left perihilar and to lesser extent left lung base airspace opacities persist in a background of chronic interstitial changes.  Cardiac silhouette appears upper limits of normal,  moderately calcified aortic knob. Soft tissue planes and included osseous structures are nonsuspicious. Multiple EKG lines overlie the patient and may obscure subtle underlying pathology.  IMPRESSION: The left chest tube, no pneumothorax.  Persisting consolidations in left lung, in a background of chronic interstitial changes.   Electronically Signed   By: Awilda Metro   On: 04/26/2013 06:18   Dg Chest Port 1 View  04/25/2013   CLINICAL DATA:  Recent pneumothorax  EXAM: PORTABLE CHEST - 1 VIEW  COMPARISON:  Study obtained earlier in the day  FINDINGS: Chest drain present on left. There is consolidation in the left mid lung, stable. There is currently no pneumothorax. There is No new opacity. There is a degree of underlying emphysema. Heart size is normal. Pulmonary vascularity reflects underlying emphysema.  IMPRESSION: Persistent consolidation left mid lung. Underlying emphysema. No new opacity. No pneumothorax apparent.   Electronically Signed   By: Bretta Bang M.D.   On: 04/25/2013 15:08    Disposition:  06-Home-Health Care Svc   Future Appointments Provider Department Dept Phone   05/04/2013 1:30 PM Leslye Peer, MD [facility]  [phone]   06/01/2013 12:30 PM [facility] 1 [facility] [phone]   Patient to arrive 15 minutes prior to appointment time. Patient to pick up oral contrast at least 1 day prior to exam, unless otherwise instructed by your physician. No solid food 4 hours prior to exam. Liquids and Medicines are okay.       Medication List         albuterol 108 (90 BASE) MCG/ACT inhaler  Commonly known as:  PROVENTIL HFA;VENTOLIN HFA  Inhale 2 puffs into the lungs every 6 (six) hours as needed for wheezing.     amitriptyline 25 MG tablet  Commonly known as:  ELAVIL  Take 25 mg by mouth at bedtime.     amLODipine 5 MG tablet  Commonly known as:  NORVASC  Take 2.5 mg by mouth every morning.     aspirin 81 MG tablet  Take  81 mg by mouth every evening.     benzonatate 100 MG capsule  Commonly known as:  TESSALON  Take 1 capsule (100 mg total) by mouth 3 (three) times daily as needed for cough.     carvedilol 6.25 MG tablet  Commonly known as:  COREG  Take 3.125 mg by mouth 2 (two) times daily with a meal.     CELEBREX 200 MG capsule  Generic drug:  celecoxib  Take 200 mg by mouth daily.     cycloSPORINE 0.05 % ophthalmic emulsion  Commonly known as:  RESTASIS  Place 1 drop into both eyes daily.     donepezil 10 MG tablet  Commonly known as:  ARICEPT  Take 10 mg by mouth at bedtime.     Fluticasone-Salmeterol 250-50 MCG/DOSE Aepb  Commonly known as:  ADVAIR DISKUS  Inhale 1 puff into the lungs 2 (two) times daily.     hydrocortisone 25 MG suppository  Commonly known as:  ANUSOL-HC  Place 25 mg rectally 2 (two) times daily as needed for hemorrhoids.     ibuprofen 800 MG tablet  Commonly known as:  ADVIL,MOTRIN  Take 800 mg by mouth every 8 (eight) hours as needed for pain.     levothyroxine 112 MCG tablet  Commonly known as:  SYNTHROID, LEVOTHROID  Take 112 mcg by mouth every morning.     mesalamine 1.2 G EC tablet  Commonly known as:  LIALDA  Take 1,200 mg by mouth daily with breakfast.     naproxen sodium 220 MG tablet  Commonly known as:  ANAPROX  Take 220 mg by mouth 2 (two) times daily as needed (pain).     nitroGLYCERIN 0.4 MG SL tablet  Commonly known as:  NITROSTAT  Place 0.4 mg under the tongue every 5 (five) minutes as needed for chest pain.     pantoprazole 40 MG tablet  Commonly known as:  PROTONIX  Take 40 mg by mouth every morning.     predniSONE 10 MG tablet  Commonly known as:  DELTASONE  Take 4 tabs  daily with food x 4 days, then 3 tabs daily x 4 days, then 2 tabs daily x 4 days, then 1 tab daily x4 days then stop. #40     Red Yeast Rice 600 MG Caps  Take 2 capsules by mouth at bedtime.     VITAMIN B-12 IJ  Inject 1,000 mcg/mL as directed every 30 (thirty)  days.           Follow-up Information   Follow up with Leslye Peer., MD On  05/04/2013. (130 pm with Dr. Delton Coombes.Get cxr at our office prior to office vist.)    Specialty:  Pulmonary Disease   Contact information:   [address] [place] [address] [phone]        Discharged Condition: good  Time spent on discharge greater than 40 minutes.  Vital signs at Discharge. Temp:  [97.7 F (36.5 C)-99.2 F (37.3 C)] 97.7 F (36.5 C) (10/28 0545) Pulse Rate:  [65-70] 65 (10/28 0545) Resp:  [18] 18 (10/28 0545) BP: (125-176)/(52-75) 176/75 mmHg (10/28 0545) SpO2:  [91 %-94 %] 92 % (10/28 0545) Weight:  [186 lb 12.8 oz (84.732 kg)] 186 lb 12.8 oz (84.732 kg) (10/28 0545) Office follow up Special Information or instructions. With Dr. Delton Coombes with chest xray. Signed: Brett Canales Minor ACNP [facility] Pager [phone] till 3 pm If no answer page [phone] 04/27/2013, 10:03 AM  Levy Pupa, MD, PhD 04/27/2013, 11:42 AM [facility] [phone] or if no answer [phone]

## 2013-05-04 ENCOUNTER — Encounter: Payer: Self-pay | Admitting: Emergency Medicine

## 2013-05-04 ENCOUNTER — Ambulatory Visit (INDEPENDENT_AMBULATORY_CARE_PROVIDER_SITE_OTHER): Payer: Medicare Other | Admitting: Emergency Medicine

## 2013-05-04 ENCOUNTER — Ambulatory Visit (INDEPENDENT_AMBULATORY_CARE_PROVIDER_SITE_OTHER)
Admission: RE | Admit: 2013-05-04 | Discharge: 2013-05-04 | Disposition: A | Discharge status: Home / Self Care | Payer: Medicare Other | Source: Ambulatory Visit | Attending: Emergency Medicine | Admitting: Emergency Medicine

## 2013-05-04 VITALS — BP 138/64 | HR 79 | Ht 71.0 in | Wt 179.2 lb

## 2013-05-04 DIAGNOSIS — J449 Chronic obstructive pulmonary disease, unspecified: Secondary | ICD-10-CM

## 2013-05-04 DIAGNOSIS — J939 Pneumothorax, unspecified: Secondary | ICD-10-CM

## 2013-05-04 DIAGNOSIS — J9383 Other pneumothorax: Secondary | ICD-10-CM

## 2013-05-04 DIAGNOSIS — J700 Acute pulmonary manifestations due to radiation: Secondary | ICD-10-CM

## 2013-05-04 MED ORDER — FLUTICASONE FUROATE-VILANTEROL 100-25 MCG/INH IN AEPB: 100.0000 ug | INHALATION_SPRAY | Freq: Every day | RESPIRATORY_TRACT | Status: DC

## 2013-05-04 NOTE — Assessment & Plan Note (Signed)
Plan Taper her prednisone to 0. She is to have a repeat CT scan of the chest in December for assessment of her lung cancer. We will followup after that scan to evaluate her pneumonitis/scar.

## 2013-05-04 NOTE — Assessment & Plan Note (Signed)
She feels that she benefited from [place]. We will continue this daily and stop Advair

## 2013-05-04 NOTE — Assessment & Plan Note (Signed)
Of persistent pneumothorax on chest x-ray in the office today

## 2013-05-04 NOTE — Patient Instructions (Addendum)
We will continue Breo daily, stop Advair Continue to taper your prednisone to zero We will repeat your Ct scan of the chest end of November as planned by Dr Karoline Caldwell.  Continue your pantoprazole, loratadine and chlorpheniramine Follow with Dr Delton Coombes in 1 month to review your CT scan

## 2013-05-04 NOTE — Progress Notes (Signed)
Subjective:    Patient ID: Jodi Frazier, female    DOB: August 24, 1930, 79 y.o.   MRN: [id number]  HPI 79 yo woman, former smoker (40 pk-yrs), HTN, CAD, adenoCA LLL s/p XRT 3-5/'14, COPD, rhinitis, GERD. She is on Advair + albuterol prn. She has had some increased cough and dyspnea since the end of XRT. Her CT scan chest done 02/22/13 shows shrinkage of LLL mass but some associated GGI in the L lung. She was just started on a pred taper by Dr Karoline Caldwell for this.   ROV 04/08/13 -- hx adenoCA LLL, COPD. She has undergone XRT to LLL lesion, was seen last time with some associated GGI suspicious for radiation pneumonitis. We treated with prednisone, she never tapered to 40mg , has been on 60mg  qd. She is coughing more since last time. Taking advair qd, albuterol prn (2x a week).   ROV 05/04/13 -- hx adenoCA LLL, COPD. She has undergone XRT to LLL lesion, has been rx with pred for suspected radiation pneumonitis / GGI.  At last visit we tapered prednisone to off. We also stopped Advair and start of Breo. We also started protonix and added an allergy regimen for her cough. She has been admitted in the interim for PTX, received more prednisone and is currently tapering. CXR today shows some clearing. She believes that Virgel Bouquet is helping her. She still has nasal gtt.    Review of Systems  Constitutional: Negative for fever and unexpected weight change.  HENT: Positive for rhinorrhea. Negative for congestion, dental problem, ear pain, nosebleeds, postnasal drip, sinus pressure, sneezing, sore throat and trouble swallowing.   Eyes: Negative for redness and itching.  Respiratory: Positive for cough and shortness of breath. Negative for chest tightness and wheezing.   Cardiovascular: Negative for palpitations and leg swelling.  Gastrointestinal: Negative for nausea and vomiting.  Genitourinary: Negative for dysuria.  Musculoskeletal: Negative for joint swelling.  Skin: Negative for rash.  Neurological: Positive for  light-headedness. Negative for headaches.  Hematological: Does not bruise/bleed easily.  Psychiatric/Behavioral: Negative for dysphoric mood. The patient is not nervous/anxious.      Filed Vitals:   05/04/13 1330  BP: 138/64  Pulse: 79  Height: 5\' 11"  (1.803 m)  Weight: 179 lb 3.2 oz (81.285 kg)  SpO2: 95%    Gen: Pleasant, well-nourished, in no distress,  normal affect  ENT: No lesions,  mouth clear,  oropharynx clear, no postnasal drip  Neck: No JVD, no TMG, no carotid bruits  Lungs: No use of accessory muscles, no dullness to percussion, clear without rales or rhonchi  Cardiovascular: RRR, heart sounds normal, no murmur or gallops, no peripheral edema  Musculoskeletal: No deformities, no cyanosis or clubbing  Neuro: alert, non focal  Skin: Warm, no lesions or rashes     Objective:   Physical Exam Filed Vitals:   05/04/13 1330  BP: 138/64  Pulse: 79  Height: 5\' 11"  (1.803 m)  Weight: 179 lb 3.2 oz (81.285 kg)  SpO2: 95%          Assessment & Plan:  Pneumothorax on left Of persistent pneumothorax on chest x-ray in the office today  COPD (chronic obstructive pulmonary disease) She feels that she benefited from [place]. We will continue this daily and stop Advair  Radiation pneumonitis Plan Taper her prednisone to 0. She is to have a repeat CT scan of the chest in December for assessment of her lung cancer. We will followup after that scan to evaluate her pneumonitis/scar.

## 2013-05-10 ENCOUNTER — Ambulatory Visit (INDEPENDENT_AMBULATORY_CARE_PROVIDER_SITE_OTHER): Payer: Medicare Other | Admitting: Physician Assistant

## 2013-05-10 ENCOUNTER — Encounter: Payer: Self-pay | Admitting: Physician Assistant

## 2013-05-10 ENCOUNTER — Other Ambulatory Visit: Payer: Self-pay | Admitting: Physician Assistant

## 2013-05-10 VITALS — BP 102/50 | HR 85 | Temp 97.5°F | Ht 71.0 in | Wt 179.1 lb

## 2013-05-10 DIAGNOSIS — K219 Gastro-esophageal reflux disease without esophagitis: Secondary | ICD-10-CM

## 2013-05-10 DIAGNOSIS — Z7189 Other specified counseling: Secondary | ICD-10-CM

## 2013-05-10 DIAGNOSIS — F0390 Unspecified dementia without behavioral disturbance: Secondary | ICD-10-CM

## 2013-05-10 DIAGNOSIS — F32A Depression, unspecified: Secondary | ICD-10-CM

## 2013-05-10 DIAGNOSIS — I10 Essential (primary) hypertension: Secondary | ICD-10-CM

## 2013-05-10 DIAGNOSIS — Z299 Encounter for prophylactic measures, unspecified: Secondary | ICD-10-CM

## 2013-05-10 DIAGNOSIS — F329 Major depressive disorder, single episode, unspecified: Secondary | ICD-10-CM

## 2013-05-10 DIAGNOSIS — C3490 Malignant neoplasm of unspecified part of unspecified bronchus or lung: Secondary | ICD-10-CM

## 2013-05-10 DIAGNOSIS — R55 Syncope and collapse: Secondary | ICD-10-CM

## 2013-05-10 DIAGNOSIS — J449 Chronic obstructive pulmonary disease, unspecified: Secondary | ICD-10-CM

## 2013-05-10 DIAGNOSIS — Z7689 Persons encountering health services in other specified circumstances: Secondary | ICD-10-CM

## 2013-05-10 DIAGNOSIS — E039 Hypothyroidism, unspecified: Secondary | ICD-10-CM

## 2013-05-10 LAB — COMPREHENSIVE METABOLIC PANEL
AST: 13 U/L (ref 0–37)
Albumin: 2.9 g/dL — ABNORMAL LOW (ref 3.5–5.2)
Alkaline Phosphatase: 93 U/L (ref 39–117)
BUN: 22 mg/dL (ref 6–23)
Creat: 1.09 mg/dL (ref 0.50–1.10)
Glucose, Bld: 85 mg/dL (ref 70–99)
Sodium: 137 mEq/L (ref 135–145)
Total Protein: 5.5 g/dL — ABNORMAL LOW (ref 6.0–8.3)

## 2013-05-10 NOTE — Assessment & Plan Note (Signed)
Continue Aricept as prescribed.  Return if symptoms worsening.  Patient A/O x 3 with remote memory intact at today's visit.

## 2013-05-10 NOTE — Progress Notes (Signed)
Pre visit review using our clinic review tool, if applicable. No additional management support is needed unless otherwise documented below in the visit note. 

## 2013-05-10 NOTE — Assessment & Plan Note (Signed)
Continue PM Amitriptyline.

## 2013-05-10 NOTE — Patient Instructions (Addendum)
Please obtain labs.  I will call you with your results.  Please stop the amlodipine (Norvasc).  Increase your fluid intake.  I want to see you in 1 week for a blood pressure recheck and follow-up.

## 2013-05-10 NOTE — Assessment & Plan Note (Signed)
Followed by Pulmonology.  Continue current regimen.  Follow-up as scheduled.

## 2013-05-10 NOTE — Assessment & Plan Note (Signed)
EKG within normal limits.  OVS negative in office.  Lasb reviewed showing decline in H/H.  Will recheck CBC, CMP, TSH, BNP.

## 2013-05-10 NOTE — Assessment & Plan Note (Signed)
Hold Norvasc for 1 week.  Check BP daily. Will follow-up in 1 week to reassess LH and BP.

## 2013-05-10 NOTE — Assessment & Plan Note (Signed)
Protonix controlling symptoms.  Continue current regimen.

## 2013-05-10 NOTE — Progress Notes (Signed)
Patient ID: Jodi Frazier, female   DOB: October 26, 1930, 79 y.o.   MRN: [id number]  Patient presents to clinic today to establish care.  Acute Concerns: Lightheadedness and near-syncopal episodes occuring over the past couple of weeks.  Patient states episodes occur upon standing too quickly or with quick positional changes.  Patient remembers all events before and after her spells.  Patient has history of HTN, but daughter states her BP has been getting lower over the past couple of weeks.  Patient denies history of heart failure, GI bleeds, melena or hematochezia.  Patient does endorse she does not stay as hydrated as she should.  Patient denies history of MI, TIA or stroke.  Chronic Issues: (1) Hypertension -- Patient currently on Norvasc (2.5 mg QD), Coreg (3.125 mg BID).  BP is beginning to decrease according to daughter.  BP in clinic today is 102/50.  Patient has had an episode of syncope in which she felt lightheaded upon standing and fell.  (2) Adenocarcinoma of Lung -- Patient followed by Radiology/Oncology (Dr. Basilio Cairo) and Pulmonology (Dr. Delton Coombes).  (3) COPD -- Patient followed by Dr. Delton Coombes.  Currently taking Albuterol and Advair.  Has Rx for Tessalon Perles for chronic cough that has improved since patient was taken off of Ramipril by Pulmonology.  (4) GERD -- Patient currently on Protonix with adequate relief of symptoms.  (5) Hypothyroidism -- Patient currently on levothyroxine 112 mcg.  Endorses symptoms well controlled.  (6) Dementia -- Patient currently on Aricept 10 mg at bedtime.    (7) Depression -- Patient currently on Amitriptyline 25 mg.  Patient endorses being on medication for 16 years.  Medication helps her with sleep.  Health Maintenance: Dental -- UTD Vision -- UTD Colonoscopy -- last in 2009.  Due in 2015. Mammogram -- last in 2013. DEXA -- last in 2013. Immunizations -- Reports UTD including tetanus.  Does not want Zostavax.  Past Medical History  Diagnosis  Date  . Hypertension   . Colitis   . Coronary artery disease   . High cholesterol     takes Red Yeast Rice and Fish OIl daily  . Kidney stones   . PONV (postoperative nausea and vomiting)   . MI (myocardial infarction) 2010  . Shortness of breath     with exertion  . Pneumonia     hx of 20+yrs ago  . COPD (chronic obstructive pulmonary disease)     pleurisy or COPD exascerbation > 59yr ago  . Headache(784.0)   . Vertigo     HTN related bc gets up too fast  . Arthritis   . Bruises easily   . History of shingles 70yrs ago    in eye;occasionally gets some residual   . Accessory skin tags     arms/legs  . GERD (gastroesophageal reflux disease)   . Colitis   . Diverticulosis   . Urinary frequency   . Stress incontinence   . History of kidney stones   . Hypothyroidism     takes sYnthroid daily  . Cataract     early stage on right  . Macular degeneration, wet   . Depression     some but takes Amitryptylline nightly  . Right-sided chest wall pain 08/28/12    PRESENTATION - RIGHT SIDED ANTERIOR CHEST PAIN  . Weight loss 08/28/12    10 LB WEIGHT LOSS OVER 3 MONTHS  . Lung mass 08/12/12    CHEST-XRAY/ PET -LOBULAR MASS LLL - 4.1 X 3.6 X 4.2 CM  .  Dementia     mild  . S/P radiation therapy  10/05/2012-11/20/2012    Left Lower Lung and hilum / 70 Gy in 35 fractions  . Adenocarcinoma of lung 09/07/12    needle core bx-LLL-adenocarcinoma  . Hypertension     Current Outpatient Prescriptions on File Prior to Visit  Medication Sig Dispense Refill  . albuterol (PROVENTIL HFA;VENTOLIN HFA) 108 (90 BASE) MCG/ACT inhaler Inhale 2 puffs into the lungs every 6 (six) hours as needed for wheezing.  1 Inhaler  3  . amitriptyline (ELAVIL) 25 MG tablet Take 25 mg by mouth at bedtime.      Marland Kitchen amLODipine (NORVASC) 5 MG tablet Take 2.5 mg by mouth every morning.       Marland Kitchen aspirin 81 MG tablet Take 81 mg by mouth every evening.       . benzonatate (TESSALON) 100 MG capsule Take 1 capsule (100 mg total)  by mouth 3 (three) times daily as needed for cough.  20 capsule  5  . carvedilol (COREG) 6.25 MG tablet Take 3.125 mg by mouth 2 (two) times daily with a meal.       . celecoxib (CELEBREX) 200 MG capsule Take 200 mg by mouth daily.      . Cyanocobalamin (VITAMIN B-12 IJ) Inject 1,000 mcg/mL as directed every 30 (thirty) days.      . cycloSPORINE (RESTASIS) 0.05 % ophthalmic emulsion Place 1 drop into both eyes daily.      Marland Kitchen donepezil (ARICEPT) 10 MG tablet Take 10 mg by mouth at bedtime.      . Fluticasone Furoate-Vilanterol (BREO ELLIPTA) 100-25 MCG/INH AEPB Inhale 100 mcg into the lungs daily.  30 each  11  . hydrocortisone (ANUSOL-HC) 25 MG suppository Place 25 mg rectally 2 (two) times daily as needed for hemorrhoids.      Marland Kitchen ibuprofen (ADVIL,MOTRIN) 800 MG tablet Take 800 mg by mouth every 8 (eight) hours as needed for pain.      Marland Kitchen levothyroxine (SYNTHROID, LEVOTHROID) 112 MCG tablet Take 112 mcg by mouth every morning.       . mesalamine (LIALDA) 1.2 G EC tablet Take 1,200 mg by mouth daily with breakfast.       . naproxen sodium (ANAPROX) 220 MG tablet Take 220 mg by mouth 2 (two) times daily as needed (pain).      . nitroGLYCERIN (NITROSTAT) 0.4 MG SL tablet Place 0.4 mg under the tongue every 5 (five) minutes as needed for chest pain.       . pantoprazole (PROTONIX) 40 MG tablet Take 40 mg by mouth every morning.       . predniSONE (DELTASONE) 10 MG tablet Take 4 tabs  daily with food x 4 days, then 3 tabs daily x 4 days, then 2 tabs daily x 4 days, then 1 tab daily x4 days then stop. #40  40 tablet  0  . Red Yeast Rice 600 MG CAPS Take 2 capsules by mouth at bedtime.      . Fluticasone-Salmeterol (ADVAIR DISKUS) 250-50 MCG/DOSE AEPB Inhale 1 puff into the lungs 2 (two) times daily.  60 each  3   No current facility-administered medications on file prior to visit.    Allergies  Allergen Reactions  . Codeine Hives    Family History  Problem Relation Age of Onset  . Anesthesia  problems Mother   . Hypotension Neg Hx   . Malignant hyperthermia Neg Hx   . Pseudochol deficiency Neg Hx   . Cancer  Maternal Aunt     breast    History   Social History  . Marital Status: Widowed    Spouse Name: N/A    Number of Children: 4  . Years of Education: N/A   Occupational History  . retired     Audiological scientist   Social History Main Topics  . Smoking status: Former Smoker -- 1.00 packs/day for 40 years    Types: Cigarettes    Quit date: 08/26/2002  . Smokeless tobacco: Never Used     Comment: quit several yrs ago  . Alcohol Use: Yes     Comment: 2 times week alcohol  . Drug Use: No  . Sexual Activity: No   Other Topics Concern  . None   Social History Narrative   Daughter is nurse in neuro ICU   Review of Systems  Constitutional: Positive for malaise/fatigue. Negative for fever and chills.  HENT: Negative for ear discharge, ear pain, hearing loss and tinnitus.   Eyes: Negative for blurred vision, double vision, photophobia and pain.  Respiratory: Positive for shortness of breath. Negative for cough and wheezing.   Cardiovascular: Negative for chest pain and palpitations.  Gastrointestinal: Positive for heartburn. Negative for nausea, vomiting, abdominal pain, diarrhea, constipation, blood in stool and melena.  Genitourinary: Negative for dysuria, urgency, frequency, hematuria and flank pain.  Neurological: Positive for dizziness. Negative for seizures and headaches.  Endo/Heme/Allergies: Negative for environmental allergies.  Psychiatric/Behavioral: Negative for depression, suicidal ideas, hallucinations and substance abuse. The patient is not nervous/anxious and does not have insomnia.    Filed Vitals:   05/10/13 1433  BP: 102/50  Pulse: 85  Temp: 97.5 F (36.4 C)    Physical Exam  Vitals reviewed. Constitutional: She is oriented to person, place, and time.  Obese, well-developed in no acute distress  HENT:  Head: Normocephalic and atraumatic.   Right Ear: External ear normal.  Left Ear: External ear normal.  Nose: Nose normal.  Mouth/Throat: Oropharynx is clear and moist. No oropharyngeal exudate.  Tympanic membranes within normal limits bilaterally.  Eyes: Conjunctivae and EOM are normal. Pupils are equal, round, and reactive to light.  Neck: Neck supple.  Cardiovascular: Normal rate, regular rhythm, normal heart sounds and intact distal pulses.   Pulmonary/Chest: Effort normal and breath sounds normal. No respiratory distress. She has no wheezes. She has no rales. She exhibits no tenderness.  Abdominal: Soft. Bowel sounds are normal. She exhibits no distension and no mass. There is no tenderness. There is no rebound and no guarding.  Lymphadenopathy:    She has no cervical adenopathy.  Neurological: She is alert and oriented to person, place, and time. No cranial nerve deficit. GCS score is 15.  Skin: Skin is warm and dry. No rash noted.  Psychiatric: Affect normal.     Recent Results (from the past 2160 hour(s))  BUN AND CREATININE (CC13)     Status: None   Collection Time    02/22/13  9:08 AM      Result Value Range   BUN 12.5  7.0 - 26.0 mg/dL   Creatinine 0.9  0.6 - 1.1 mg/dL  CBC     Status: Abnormal   Collection Time    04/24/13 10:29 AM      Result Value Range   WBC 8.8  4.0 - 10.5 K/uL   RBC 4.77  3.87 - 5.11 MIL/uL   Hemoglobin 14.1  12.0 - 15.0 g/dL   HCT 54.0  98.1 - 19.1 %  MCV 87.4  78.0 - 100.0 fL   MCH 29.6  26.0 - 34.0 pg   MCHC 33.8  30.0 - 36.0 g/dL   RDW 16.1 (*) 09.6 - 04.5 %   Platelets 233  150 - 400 K/uL  BASIC METABOLIC PANEL     Status: Abnormal   Collection Time    04/24/13 10:29 AM      Result Value Range   Sodium 132 (*) 135 - 145 mEq/L   Potassium 3.9  3.5 - 5.1 mEq/L   Chloride 96  96 - 112 mEq/L   CO2 24  19 - 32 mEq/L   Glucose, Bld 124 (*) 70 - 99 mg/dL   BUN 17  6 - 23 mg/dL   Creatinine, Ser 4.09  0.50 - 1.10 mg/dL   Calcium 81.1  8.4 - 91.4 mg/dL   GFR calc non Af  Amer 49 (*) >90 mL/min   GFR calc Af Amer 57 (*) >90 mL/min   Comment: (NOTE)     The eGFR has been calculated using the CKD EPI equation.     This calculation has not been validated in all clinical situations.     eGFR's persistently <90 mL/min signify possible Chronic Kidney     Disease.  PRO B NATRIURETIC PEPTIDE     Status: None   Collection Time    04/24/13 10:29 AM      Result Value Range   Pro B Natriuretic peptide (BNP) 435.7  0 - 450 pg/mL  TROPONIN I     Status: None   Collection Time    04/24/13 10:29 AM      Result Value Range   Troponin I <0.30  <0.30 ng/mL   Comment:            Due to the release kinetics of cTnI,     a negative result within the first hours     of the onset of symptoms does not rule out     myocardial infarction with certainty.     If myocardial infarction is still suspected,     repeat the test at appropriate intervals.  POCT I-STAT TROPONIN I     Status: None   Collection Time    04/24/13 10:42 AM      Result Value Range   Troponin i, poc 0.02  0.00 - 0.08 ng/mL   Comment 3            Comment: Due to the release kinetics of cTnI,     a negative result within the first hours     of the onset of symptoms does not rule out     myocardial infarction with certainty.     If myocardial infarction is still suspected,     repeat the test at appropriate intervals.  MRSA PCR SCREENING     Status: None   Collection Time    04/24/13  3:38 PM      Result Value Range   MRSA by PCR NEGATIVE  NEGATIVE   Comment:            The GeneXpert MRSA Assay (FDA     approved for NASAL specimens     only), is one component of a     comprehensive MRSA colonization     surveillance program. It is not     intended to diagnose MRSA     infection nor to guide or     monitor treatment for     MRSA  infections.  CBC     Status: None   Collection Time    04/25/13  3:45 AM      Result Value Range   WBC 7.3  4.0 - 10.5 K/uL   RBC 4.21  3.87 - 5.11 MIL/uL    Hemoglobin 12.1  12.0 - 15.0 g/dL   HCT 45.4  09.8 - 11.9 %   MCV 86.2  78.0 - 100.0 fL   MCH 28.7  26.0 - 34.0 pg   MCHC 33.3  30.0 - 36.0 g/dL   RDW 14.7  82.9 - 56.2 %   Platelets 201  150 - 400 K/uL  BASIC METABOLIC PANEL     Status: Abnormal   Collection Time    04/25/13  3:45 AM      Result Value Range   Sodium 131 (*) 135 - 145 mEq/L   Potassium 4.3  3.5 - 5.1 mEq/L   Chloride 99  96 - 112 mEq/L   CO2 24  19 - 32 mEq/L   Glucose, Bld 197 (*) 70 - 99 mg/dL   BUN 22  6 - 23 mg/dL   Creatinine, Ser 1.30  0.50 - 1.10 mg/dL   Calcium 86.5  8.4 - 78.4 mg/dL   GFR calc non Af Amer 51 (*) >90 mL/min   GFR calc Af Amer 59 (*) >90 mL/min   Comment: (NOTE)     The eGFR has been calculated using the CKD EPI equation.     This calculation has not been validated in all clinical situations.     eGFR's persistently <90 mL/min signify possible Chronic Kidney     Disease.  BASIC METABOLIC PANEL     Status: Abnormal   Collection Time    04/26/13  5:40 AM      Result Value Range   Sodium 137  135 - 145 mEq/L   Potassium 4.3  3.5 - 5.1 mEq/L   Chloride 105  96 - 112 mEq/L   CO2 23  19 - 32 mEq/L   Glucose, Bld 145 (*) 70 - 99 mg/dL   BUN 25 (*) 6 - 23 mg/dL   Creatinine, Ser 6.96  0.50 - 1.10 mg/dL   Calcium 9.3  8.4 - 29.5 mg/dL   GFR calc non Af Amer 60 (*) >90 mL/min   GFR calc Af Amer 70 (*) >90 mL/min   Comment: (NOTE)     The eGFR has been calculated using the CKD EPI equation.     This calculation has not been validated in all clinical situations.     eGFR's persistently <90 mL/min signify possible Chronic Kidney     Disease.  CBC     Status: Abnormal   Collection Time    04/26/13  5:40 AM      Result Value Range   WBC 14.7 (*) 4.0 - 10.5 K/uL   RBC 3.89  3.87 - 5.11 MIL/uL   Hemoglobin 11.5 (*) 12.0 - 15.0 g/dL   HCT 28.4 (*) 13.2 - 44.0 %   MCV 86.4  78.0 - 100.0 fL   MCH 29.6  26.0 - 34.0 pg   MCHC 34.2  30.0 - 36.0 g/dL   RDW 10.2  72.5 - 36.6 %   Platelets 235   150 - 400 K/uL    Assessment/Plan: HTN (hypertension) Hold Norvasc for 1 week.  Check BP daily. Will follow-up in 1 week to reassess LH and BP.  Near syncope EKG within normal limits.  OVS negative  in office.  Lasb reviewed showing decline in H/H.  Will recheck CBC, CMP, TSH, BNP.  Adenocarcinoma of lung Followed by Rad/Onc and Pulmonology  COPD (chronic obstructive pulmonary disease) Followed by Pulmonology.  Continue current regimen.  Follow-up as scheduled.  GERD (gastroesophageal reflux disease) Protonix controlling symptoms.  Continue current regimen.  Hypothyroidism Will recheck TSH.  Dementia Continue Aricept as prescribed.  Return if symptoms worsening.  Patient A/O x 3 with remote memory intact at today's visit.  Depression Continue PM Amitriptyline.  Encounter to establish care with new doctor Will obtain labs.  Will obtain records from previous PCP for review.

## 2013-05-10 NOTE — Assessment & Plan Note (Signed)
Followed by Rad/Onc and Pulmonology

## 2013-05-10 NOTE — Assessment & Plan Note (Signed)
Will recheck TSH

## 2013-05-10 NOTE — Assessment & Plan Note (Signed)
Will obtain labs.  Will obtain records from previous PCP for review.

## 2013-05-11 LAB — CBC WITH DIFFERENTIAL/PLATELET
Basophils Absolute: 0.1 10*3/uL (ref 0.0–0.1)
Basophils Relative: 1 % (ref 0–1)
Eosinophils Absolute: 0.1 10*3/uL (ref 0.0–0.7)
Eosinophils Relative: 1 % (ref 0–5)
Hemoglobin: 13.4 g/dL (ref 12.0–15.0)
Lymphocytes Relative: 10 % — ABNORMAL LOW (ref 12–46)
Lymphs Abs: 1 10*3/uL (ref 0.7–4.0)
Monocytes Absolute: 0.8 10*3/uL (ref 0.1–1.0)
Neutro Abs: 8.4 10*3/uL — ABNORMAL HIGH (ref 1.7–7.7)
Neutrophils Relative %: 81 % — ABNORMAL HIGH (ref 43–77)
Platelets: 262 10*3/uL (ref 150–400)
RBC: 4.51 MIL/uL (ref 3.87–5.11)
WBC: 10.3 10*3/uL (ref 4.0–10.5)

## 2013-05-11 LAB — TSH: TSH: 6.559 u[IU]/mL — ABNORMAL HIGH (ref 0.350–4.500)

## 2013-05-11 LAB — PRO B NATRIURETIC PEPTIDE: Pro B Natriuretic peptide (BNP): 267.1 pg/mL (ref <451)

## 2013-05-12 ENCOUNTER — Emergency Department (HOSPITAL_COMMUNITY): Payer: Medicare Other

## 2013-05-12 ENCOUNTER — Emergency Department (HOSPITAL_COMMUNITY)
Admission: EM | Admit: 2013-05-12 | Discharge: 2013-05-12 | Disposition: A | Discharge status: Home / Self Care | Payer: Medicare Other | Attending: Emergency Medicine | Admitting: Emergency Medicine

## 2013-05-12 ENCOUNTER — Encounter (HOSPITAL_COMMUNITY): Payer: Self-pay | Admitting: Emergency Medicine

## 2013-05-12 ENCOUNTER — Telehealth: Payer: Self-pay | Admitting: Physician Assistant

## 2013-05-12 DIAGNOSIS — I2510 Atherosclerotic heart disease of native coronary artery without angina pectoris: Secondary | ICD-10-CM | POA: Insufficient documentation

## 2013-05-12 DIAGNOSIS — N390 Urinary tract infection, site not specified: Secondary | ICD-10-CM

## 2013-05-12 DIAGNOSIS — I10 Essential (primary) hypertension: Secondary | ICD-10-CM | POA: Insufficient documentation

## 2013-05-12 DIAGNOSIS — Z87442 Personal history of urinary calculi: Secondary | ICD-10-CM | POA: Insufficient documentation

## 2013-05-12 DIAGNOSIS — R42 Dizziness and giddiness: Secondary | ICD-10-CM | POA: Insufficient documentation

## 2013-05-12 DIAGNOSIS — Z9861 Coronary angioplasty status: Secondary | ICD-10-CM | POA: Insufficient documentation

## 2013-05-12 DIAGNOSIS — IMO0002 Reserved for concepts with insufficient information to code with codable children: Secondary | ICD-10-CM | POA: Insufficient documentation

## 2013-05-12 DIAGNOSIS — E7800 Pure hypercholesterolemia, unspecified: Secondary | ICD-10-CM | POA: Insufficient documentation

## 2013-05-12 DIAGNOSIS — Y9389 Activity, other specified: Secondary | ICD-10-CM | POA: Insufficient documentation

## 2013-05-12 DIAGNOSIS — J449 Chronic obstructive pulmonary disease, unspecified: Secondary | ICD-10-CM | POA: Insufficient documentation

## 2013-05-12 DIAGNOSIS — Z8619 Personal history of other infectious and parasitic diseases: Secondary | ICD-10-CM | POA: Insufficient documentation

## 2013-05-12 DIAGNOSIS — K5289 Other specified noninfective gastroenteritis and colitis: Secondary | ICD-10-CM | POA: Insufficient documentation

## 2013-05-12 DIAGNOSIS — Z87891 Personal history of nicotine dependence: Secondary | ICD-10-CM | POA: Insufficient documentation

## 2013-05-12 DIAGNOSIS — Z7982 Long term (current) use of aspirin: Secondary | ICD-10-CM | POA: Insufficient documentation

## 2013-05-12 DIAGNOSIS — Z96659 Presence of unspecified artificial knee joint: Secondary | ICD-10-CM | POA: Insufficient documentation

## 2013-05-12 DIAGNOSIS — Y9289 Other specified places as the place of occurrence of the external cause: Secondary | ICD-10-CM | POA: Insufficient documentation

## 2013-05-12 DIAGNOSIS — F0390 Unspecified dementia without behavioral disturbance: Secondary | ICD-10-CM | POA: Insufficient documentation

## 2013-05-12 DIAGNOSIS — J4489 Other specified chronic obstructive pulmonary disease: Secondary | ICD-10-CM | POA: Insufficient documentation

## 2013-05-12 DIAGNOSIS — Z9181 History of falling: Secondary | ICD-10-CM | POA: Insufficient documentation

## 2013-05-12 DIAGNOSIS — Z8701 Personal history of pneumonia (recurrent): Secondary | ICD-10-CM | POA: Insufficient documentation

## 2013-05-12 DIAGNOSIS — Z8669 Personal history of other diseases of the nervous system and sense organs: Secondary | ICD-10-CM | POA: Insufficient documentation

## 2013-05-12 DIAGNOSIS — R296 Repeated falls: Secondary | ICD-10-CM | POA: Insufficient documentation

## 2013-05-12 DIAGNOSIS — K219 Gastro-esophageal reflux disease without esophagitis: Secondary | ICD-10-CM | POA: Insufficient documentation

## 2013-05-12 DIAGNOSIS — Z923 Personal history of irradiation: Secondary | ICD-10-CM | POA: Insufficient documentation

## 2013-05-12 DIAGNOSIS — I252 Old myocardial infarction: Secondary | ICD-10-CM | POA: Insufficient documentation

## 2013-05-12 DIAGNOSIS — E039 Hypothyroidism, unspecified: Secondary | ICD-10-CM | POA: Insufficient documentation

## 2013-05-12 DIAGNOSIS — Z79899 Other long term (current) drug therapy: Secondary | ICD-10-CM | POA: Insufficient documentation

## 2013-05-12 DIAGNOSIS — Z85118 Personal history of other malignant neoplasm of bronchus and lung: Secondary | ICD-10-CM | POA: Insufficient documentation

## 2013-05-12 DIAGNOSIS — M25551 Pain in right hip: Secondary | ICD-10-CM

## 2013-05-12 DIAGNOSIS — Z872 Personal history of diseases of the skin and subcutaneous tissue: Secondary | ICD-10-CM | POA: Insufficient documentation

## 2013-05-12 DIAGNOSIS — S79919A Unspecified injury of unspecified hip, initial encounter: Secondary | ICD-10-CM | POA: Insufficient documentation

## 2013-05-12 DIAGNOSIS — Z96649 Presence of unspecified artificial hip joint: Secondary | ICD-10-CM | POA: Insufficient documentation

## 2013-05-12 DIAGNOSIS — F329 Major depressive disorder, single episode, unspecified: Secondary | ICD-10-CM | POA: Insufficient documentation

## 2013-05-12 DIAGNOSIS — F3289 Other specified depressive episodes: Secondary | ICD-10-CM | POA: Insufficient documentation

## 2013-05-12 LAB — URINALYSIS, ROUTINE W REFLEX MICROSCOPIC
Bilirubin Urine: NEGATIVE
Glucose, UA: NEGATIVE mg/dL
Ketones, ur: NEGATIVE mg/dL
Nitrite: NEGATIVE
Specific Gravity, Urine: 1.022 (ref 1.005–1.030)
pH: 6 (ref 5.0–8.0)

## 2013-05-12 LAB — URINE MICROSCOPIC-ADD ON

## 2013-05-12 MED ORDER — MELOXICAM 15 MG PO TABS: 15.0000 mg | ORAL_TABLET | Freq: Every day | ORAL | Status: DC

## 2013-05-12 MED ORDER — METHOCARBAMOL 500 MG PO TABS: 500.0000 mg | ORAL_TABLET | Freq: Two times a day (BID) | ORAL | Status: DC

## 2013-05-12 MED ORDER — METHOCARBAMOL 500 MG PO TABS
1000.0000 mg | ORAL_TABLET | Freq: Once | ORAL | Status: AC
  Administered 2013-05-12: 1000 mg via ORAL
  Filled 2013-05-12: qty 2

## 2013-05-12 MED ORDER — CEPHALEXIN 500 MG PO CAPS
500.0000 mg | ORAL_CAPSULE | Freq: Once | ORAL | Status: AC
  Administered 2013-05-12: 500 mg via ORAL
  Filled 2013-05-12: qty 1

## 2013-05-12 MED ORDER — CEPHALEXIN 500 MG PO CAPS: 500.0000 mg | ORAL_CAPSULE | Freq: Four times a day (QID) | ORAL | Status: DC

## 2013-05-12 MED ORDER — KETOROLAC TROMETHAMINE 30 MG/ML IJ SOLN
30.0000 mg | Freq: Once | INTRAMUSCULAR | Status: AC
  Administered 2013-05-12: 30 mg via INTRAMUSCULAR
  Filled 2013-05-12: qty 1

## 2013-05-12 NOTE — ED Notes (Signed)
Called ems to transport awaiting arrival.

## 2013-05-12 NOTE — ED Provider Notes (Signed)
CSN: [id number]     Arrival date & time 05/12/13  0042 History   First MD Initiated Contact with Patient 05/12/13 0124     Chief Complaint  Patient presents with  . Hip Pain   HPI  History provided by patient and daughter. Patient is an 79 year old female with significant past medical history of hypertension, hypercholesterolemia, CAD with previous MI, COPD, lung mass and right total hip replacement who presents with complaints of worsened right hip pain. Patient and daughter report that she has had several falls recently over the past 3 weeks. She has been evaluated at her primary care office and was seen in the emergency department recently for some of these falls. Patient recalls that most have been somewhat mechanical for example hitting her toe causing her to lose balance and falling. She did have 1 fall over a week ago when she was in the kitchen and became very lightheaded and then only remembers being on the ground. The daughter is concerned some of her other recent falls which were unwitnessed they have also been from possible syncopal episodes. Patient does not report any new falls but states when she was seen at her doctor's office 2 days ago she had some lab testing performed but no additional x-rays for her worsened right hip pain. Her pain has made it difficult to stand and bear weight. She denies any change in sensations to the foot. She denies any low back pain. Denies any headaches. Denies any chest pain or shortness of breath worse than her baseline.   Past Medical History  Diagnosis Date  . Hypertension   . Colitis   . Coronary artery disease   . High cholesterol     takes Red Yeast Rice and Fish OIl daily  . Kidney stones   . PONV (postoperative nausea and vomiting)   . MI (myocardial infarction) 2010  . Shortness of breath     with exertion  . Pneumonia     hx of 20+yrs ago  . COPD (chronic obstructive pulmonary disease)     pleurisy or COPD exascerbation > 31yr ago  .  Headache(784.0)   . Vertigo     HTN related bc gets up too fast  . Arthritis   . Bruises easily   . History of shingles 5yrs ago    in eye;occasionally gets some residual   . Accessory skin tags     arms/legs  . GERD (gastroesophageal reflux disease)   . Colitis   . Diverticulosis   . Urinary frequency   . Stress incontinence   . History of kidney stones   . Hypothyroidism     takes sYnthroid daily  . Cataract     early stage on right  . Macular degeneration, wet   . Depression     some but takes Amitryptylline nightly  . Right-sided chest wall pain 08/28/12    PRESENTATION - RIGHT SIDED ANTERIOR CHEST PAIN  . Weight loss 08/28/12    10 LB WEIGHT LOSS OVER 3 MONTHS  . Lung mass 08/12/12    CHEST-XRAY/ PET -LOBULAR MASS LLL - 4.1 X 3.6 X 4.2 CM  . Dementia     mild  . S/P radiation therapy  10/05/2012-11/20/2012    Left Lower Lung and hilum / 70 Gy in 35 fractions  . Adenocarcinoma of lung 09/07/12    needle core bx-LLL-adenocarcinoma  . Hypertension    Past Surgical History  Procedure Laterality Date  . Hip arthroscopy  61yr ago  right hip-replacement  . Knee arthroscopy      LT  . Total knee arthroplasty  65yrs ago    left  . Appendectomy  45yrs ago  . Abdominal hysterectomy  83yrs ago   . Lithotripsy  2011  . Cataract surgery  2012    left  . Eye surgery  3yrs ago  . Bladder surgery  2001    tacked  . Coronary angioplasty with stent placement  2010    2 in rt coronary artery and 1 in another spot  . Colonoscopy    . Esophagogastroduodenoscopy    . Total knee arthroplasty  09/09/2011    Procedure: TOTAL KNEE ARTHROPLASTY;  Surgeon: Raymon Mutton, MD;  Location: [facility] OR;  Service: Orthopedics;  Laterality: Right;  Right Total Knee Arthroplasty  . Bronchial brush biopsy Left 08/17/12    LLL Bronchial Washing / Brushing and Bronchial Biopsy: Negative for malignancy  . Left lower lobe needle core biopsy Left 09/07/12    Poorly Differentiated Adenocarcinoma   .  Joint replacement     Family History  Problem Relation Age of Onset  . Anesthesia problems Mother   . Hypotension Neg Hx   . Malignant hyperthermia Neg Hx   . Pseudochol deficiency Neg Hx   . Cancer Maternal Aunt     breast   History  Substance Use Topics  . Smoking status: Former Smoker -- 1.00 packs/day for 40 years    Types: Cigarettes    Quit date: 08/26/2002  . Smokeless tobacco: Never Used     Comment: quit several yrs ago  . Alcohol Use: Yes     Comment: 2 times week alcohol   OB History   Grav Para Term Preterm Abortions TAB SAB Ect Mult Living                 Review of Systems  Constitutional: Negative for fever, chills and diaphoresis.  Respiratory: Negative for shortness of breath.   Cardiovascular: Negative for chest pain and palpitations.  Gastrointestinal: Negative for vomiting, abdominal pain and diarrhea.  Musculoskeletal: Negative for back pain and neck pain.  Neurological: Positive for light-headedness. Negative for headaches.  All other systems reviewed and are negative.    Allergies  Codeine  Home Medications   Current Outpatient Rx  Name  Route  Sig  Dispense  Refill  . amitriptyline (ELAVIL) 25 MG tablet   Oral   Take 25 mg by mouth at bedtime.         Marland Kitchen amLODipine (NORVASC) 5 MG tablet   Oral   Take 2.5 mg by mouth every morning.          Marland Kitchen aspirin 81 MG tablet   Oral   Take 81 mg by mouth every evening.          . benzonatate (TESSALON) 100 MG capsule   Oral   Take 1 capsule (100 mg total) by mouth 3 (three) times daily as needed for cough.   20 capsule   5   . carvedilol (COREG) 6.25 MG tablet   Oral   Take 3.125 mg by mouth 2 (two) times daily with a meal.          . celecoxib (CELEBREX) 200 MG capsule   Oral   Take 200 mg by mouth daily.         . cycloSPORINE (RESTASIS) 0.05 % ophthalmic emulsion   Both Eyes   Place 1 drop into both eyes daily.         Marland Kitchen  donepezil (ARICEPT) 10 MG tablet   Oral   Take  10 mg by mouth at bedtime.         . Fluticasone Furoate-Vilanterol (BREO ELLIPTA) 100-25 MCG/INH AEPB   Inhalation   Inhale 100 mcg into the lungs daily.   30 each   11   . Fluticasone-Salmeterol (ADVAIR DISKUS) 250-50 MCG/DOSE AEPB   Inhalation   Inhale 1 puff into the lungs 2 (two) times daily.   60 each   3   . ibuprofen (ADVIL,MOTRIN) 800 MG tablet   Oral   Take 800 mg by mouth every 8 (eight) hours as needed for pain.         Marland Kitchen levothyroxine (SYNTHROID, LEVOTHROID) 112 MCG tablet   Oral   Take 112 mcg by mouth every morning.          . mesalamine (LIALDA) 1.2 G EC tablet   Oral   Take 1,200 mg by mouth daily with breakfast.          . naproxen sodium (ANAPROX) 220 MG tablet   Oral   Take 220 mg by mouth 2 (two) times daily as needed (pain).         . pantoprazole (PROTONIX) 40 MG tablet   Oral   Take 40 mg by mouth every morning.          . Red Yeast Rice 600 MG CAPS   Oral   Take 2 capsules by mouth at bedtime.         Marland Kitchen albuterol (PROVENTIL HFA;VENTOLIN HFA) 108 (90 BASE) MCG/ACT inhaler   Inhalation   Inhale 2 puffs into the lungs every 6 (six) hours as needed for wheezing.   1 Inhaler   3   . Cyanocobalamin (VITAMIN B-12 IJ)   Injection   Inject 1,000 mcg/mL as directed every 30 (thirty) days.         . nitroGLYCERIN (NITROSTAT) 0.4 MG SL tablet   Sublingual   Place 0.4 mg under the tongue every 5 (five) minutes as needed for chest pain.           BP 122/51  Pulse 82  Temp(Src) 98.3 F (36.8 C) (Oral)  Resp 18  Ht 5\' 11"  (1.803 m)  Wt 178 lb (80.74 kg)  BMI 24.84 kg/m2  SpO2 92% Physical Exam  Nursing note and vitals reviewed. Constitutional: She is oriented to person, place, and time. She appears well-developed and well-nourished. No distress.  HENT:  Head: Normocephalic.  Eyes: Conjunctivae and EOM are normal.  Pupils are unequal but at baseline per patient's history and prior surgeries.  Neck: Normal range of motion.   Cardiovascular: Normal rate and regular rhythm.   Pulmonary/Chest: Effort normal and breath sounds normal. No respiratory distress. She has no wheezes. She has no rales.  Abdominal: Soft. There is no tenderness. There is no rebound and no guarding.  Musculoskeletal:  There is tenderness to the right hip area especially the posterior lateral aspect. No gross deformities. No shortening or rotation of the hip or leg. Normal distal dorsal pedal pulses. Normal strength in the foot. Baseline sensation in the foot.  Neurological: She is alert and oriented to person, place, and time. No cranial nerve deficit.  Strength is equal bilaterally. Patient has some intentional tremor and against resistance in extremities.  Skin: Skin is warm and dry.  Psychiatric: She has a normal mood and affect. Her behavior is normal.    ED Course  Procedures   DIAGNOSTIC STUDIES: Oxygen  Saturation is 92% on room air.  COORDINATION OF CARE:  Nursing notes reviewed. Vital signs reviewed. Initial pt interview and examination performed.   1:50 AM patient seen and evaluated. She appears well in no acute distress. Denies significant pains while lying still on the bed. Daughter is concerned for some of her recent increased falls and it is unsure if she had any possible head injuries. No signs of head injury on exam however have agreed to add a CT of the head to rule out any injury or cause of falls.  Pt agrees with plan.   Recent lab testing reviewed. Labs from 2 days ago not show any concerning findings on CBC or CMP.  Patient did have slightly elevated TSH.  She is currently on Synthroid diminished by PCP  Patient was also seen and evaluated with attending physician. We'll plan to obtain CT scans of the hip for better evaluation. UA does show signs of UTI. Patient and family made aware.  Toradol and Robaxin given for pain. Patient is having some improvements her CT scan of the hip does not show any concerning signs or  fractures. There is some reactions to the hip replacement which may be causing her pain. She does however feels improved and has been able to ambulate with a walker much better than she has in recent days. At this time daughter feels comfortable taking patient home and following up with her orthopedic specialist and PCP.    Results for orders placed during the hospital encounter of 05/12/13  URINALYSIS, ROUTINE W REFLEX MICROSCOPIC      Result Value Range   Color, Urine YELLOW  YELLOW   APPearance CLOUDY (*) CLEAR   Specific Gravity, Urine 1.022  1.005 - 1.030   pH 6.0  5.0 - 8.0   Glucose, UA NEGATIVE  NEGATIVE mg/dL   Hgb urine dipstick TRACE (*) NEGATIVE   Bilirubin Urine NEGATIVE  NEGATIVE   Ketones, ur NEGATIVE  NEGATIVE mg/dL   Protein, ur NEGATIVE  NEGATIVE mg/dL   Urobilinogen, UA 0.2  0.0 - 1.0 mg/dL   Nitrite NEGATIVE  NEGATIVE   Leukocytes, UA MODERATE (*) NEGATIVE  URINE MICROSCOPIC-ADD ON      Result Value Range   Squamous Epithelial / LPF RARE  RARE   WBC, UA 11-20  <3 WBC/hpf   RBC / HPF 0-2  <3 RBC/hpf   Bacteria, UA MANY (*) RARE     Imaging Review Dg Chest 2 View  05/12/2013   CLINICAL DATA:  Fall, hip pain.  EXAM: CHEST  2 VIEW  COMPARISON:  Chest radiograph May 04, 2013 and CT of the chest April 24, 2013  FINDINGS: Cardiac silhouette is unremarkable, similar chronic interstitial changes, with more fullness in strandy densities in left hilum, similar. Left greater than right apical pleural thickening. Patient's known  mass in left lung base is better seen on prior CT. No pneumothorax.  Moderate calcific atherosclerosis of the aortic knob. Patient is osteopenic.  IMPRESSION: No active cardiopulmonary disease.  Chronic interstitial changes with apical pleural thickening, known left lung mass better seen on prior CT with similar fullness of the left hilum.   Electronically Signed   By: Awilda Metro   On: 05/12/2013 04:27   Dg Hip Complete  Right  05/12/2013   CLINICAL DATA:  Right hip pain following a fall two days ago.  EXAM: RIGHT HIP - COMPLETE 2+ VIEW  COMPARISON:  None.  FINDINGS: Like total hip prosthesis. The most distal portion  of the prosthesis is not included. No fracture or dislocation seen. Diffuse osteopenia.  IMPRESSION: Right total hip prosthesis without fracture or dislocation. The most distal portion of the femoral component is not included. If there is a clinical concern for a fracture at that level, repeat views to include the distal portion would be recommended.   Electronically Signed   By: Gordan Payment M.D.   On: 05/12/2013 01:57   Ct Head Wo Contrast  05/12/2013   CLINICAL DATA:  Headache.  Multiple falls.  EXAM: CT HEAD WITHOUT CONTRAST  TECHNIQUE: Contiguous axial images were obtained from the base of the skull through the vertex without intravenous contrast.  COMPARISON:  Brain MR dated 09/07/2012.  FINDINGS: Stable prominence subarachnoid spaces and minimally prominent ventricles. No intracranial hemorrhage, mass lesion or CT evidence of acute infarction. No skull fractures or paranasal sinus air-fluid levels.  IMPRESSION: Stable atrophy.  No acute abnormality.   Electronically Signed   By: Gordan Payment M.D.   On: 05/12/2013 02:37   Ct Hip Right Wo Contrast  05/12/2013   CLINICAL DATA:  Fall, right hip pain.  EXAM: CT OF THE RIGHT HIP WITHOUT CONTRAST  TECHNIQUE: Multidetector CT imaging was performed according to the standard protocol. Multiplanar CT image reconstructions were also generated.  COMPARISON:  Right hip radiograph May 12, 2013  FINDINGS: The status post right hip total arthroplasty, hardware results in extensive streak artifact, the most caudal aspect of the right femoral stem is not imaged. Focal osteopenia about the right acetabular component concerning for small particle disease. No fracture.  Limited view of the pelvis demonstrates diverticulosis with mild inflammatory changes, equivocal for  acute diverticulitis. 3.5 cm round right at on the axial fluid density (1 Hounsfield unit) cyst. Status post apparent hysterectomy.  No evidence of the disproportionate muscle atrophy nor denervation.  IMPRESSION: Status post right hip total arthroplasty with focal osteopenia about the acetabular cup concerning for small particle disease.  No acute fracture deformity or dislocation.  Partially imaged diverticulosis, equivocal findings for acute diverticulitis, incompletely characterized.  3.5 cm right adnexal cyst for which further characterization with ultrasound, on a nonemergent basis, is recommended. This recommendation follows ACR consensus guidelines: White Paper of the ACR Incidental Findings Committee II on Adnexal Findings. J Am Coll Radiol [phone].   Electronically Signed   By: Awilda Metro   On: 05/12/2013 04:21    EKG Interpretation   None       MDM   1. UTI (lower urinary tract infection)   2. Hip pain, acute, right       Angus Seller, PA-C 05/12/13 [id number]

## 2013-05-12 NOTE — ED Provider Notes (Addendum)
Medical screening examination/treatment/procedure(s) were conducted as a shared visit with non-physician practitioner(s) and myself.  I personally evaluated the patient during the encounter.  EKG Interpretation   None        EKG Interpretation   None      Seen and personally examined  NCAT, EOMI RRR CTAB NABS Pelvis stable Intact DTRs intact B dorsalis pedis, no foreshortening of the RLE  Gait intact.    Will d/c with close follow up and pain medication.  Patient feels markedly improved  Jodi Frazier K Annalisia Ingber-Rasch, MD 05/12/13 [phone]

## 2013-05-12 NOTE — Telephone Encounter (Addendum)
LMOM 8:33 am on 05/12/13, with number for patient to return call.  Need to discuss recent lab findings. Overall her labs look good.  Her Hbg/Hct has increased from last check. TSH elevated, therefore we need to increase patient's dosage of levothyroxine to 125 mcg QD.  I need to know if she is taking generic levothyroxine or a brand drug like Synthroid or Levothroid. Will need to see her in 4-6 weeks for repeat TSH.  Patient has follow-up appointment next week for BP recheck.

## 2013-05-12 NOTE — ED Notes (Signed)
ems in ed to transport pt home.

## 2013-05-12 NOTE — ED Notes (Signed)
Bed: WA07 Expected date:  Expected time:  Means of arrival:  Comments: EMS/elderly with hip pain/recent replacement and fall on Sunday

## 2013-05-12 NOTE — ED Notes (Signed)
Pt brought in via ems d/t right hip pain. Pt had hip replacement about 4 years ago. Pt has been falling for the past week and has had hip pain since then. Pt has seen a doctor in the last week with a dx of lung ca and mentioned hip pain but no x rays done. Pt;s last fall was Sunday.

## 2013-05-13 NOTE — Telephone Encounter (Signed)
Called patient at 8:32 on 05/13/2013 requesting call back.

## 2013-05-14 LAB — URINE CULTURE: Culture: >=100000

## 2013-05-14 NOTE — Telephone Encounter (Signed)
LMOM with contact name and number for return call RE: results and further provider instructions; informed to please keep appt Mon, 11.17.14 at 10:00a/SLS

## 2013-05-14 NOTE — Telephone Encounter (Signed)
Can we attempt to reach Jodi Frazier once more through the phone?  If not, she is returning on Monday for follow-up and we can address her results at that time.

## 2013-05-16 ENCOUNTER — Telehealth (HOSPITAL_COMMUNITY): Payer: Self-pay | Admitting: Emergency Medicine

## 2013-05-16 NOTE — ED Notes (Signed)
Post ED Visit - Positive Culture Follow-up  Culture report reviewed by antimicrobial stewardship pharmacist: []  Wes Dulaney, Pharm.D., BCPS []  Celedonio Miyamoto, Pharm.D., BCPS []  Georgina Pillion, Pharm.D., BCPS []  [place], [address].D., BCPS, AAHIVP []  Estella Husk, Pharm.D., BCPS, AAHIVP [x]  Okey Regal, Pharm.D., BCPS  Positive urine culture Treated with Keflex, organism sensitive to the same and no further patient follow-up is required at this time.  Kylie A Holland 05/16/2013, 1:20 PM

## 2013-05-17 ENCOUNTER — Encounter: Payer: Self-pay | Admitting: Physician Assistant

## 2013-05-17 ENCOUNTER — Ambulatory Visit (INDEPENDENT_AMBULATORY_CARE_PROVIDER_SITE_OTHER): Payer: Medicare Other | Admitting: Physician Assistant

## 2013-05-17 VITALS — BP 96/58 | HR 97 | Temp 97.7°F | Resp 16 | Ht 71.0 in

## 2013-05-17 DIAGNOSIS — E039 Hypothyroidism, unspecified: Secondary | ICD-10-CM

## 2013-05-17 DIAGNOSIS — R55 Syncope and collapse: Secondary | ICD-10-CM

## 2013-05-17 DIAGNOSIS — N390 Urinary tract infection, site not specified: Secondary | ICD-10-CM

## 2013-05-17 DIAGNOSIS — I10 Essential (primary) hypertension: Secondary | ICD-10-CM

## 2013-05-17 MED ORDER — LEVOTHYROXINE SODIUM 125 MCG PO TABS: 125.0000 ug | ORAL_TABLET | Freq: Every day | ORAL | Status: DC

## 2013-05-17 MED ORDER — CEFUROXIME AXETIL 250 MG PO TABS: 250.0000 mg | ORAL_TABLET | Freq: Two times a day (BID) | ORAL | Status: DC

## 2013-05-17 NOTE — Assessment & Plan Note (Signed)
BP 98/56 in clinic today.  Will keep patient off of Amlodipine.  Encouraged fluid intake given BP today.

## 2013-05-17 NOTE — Progress Notes (Signed)
Pre visit review using our clinic review tool, if applicable. No additional management support is needed unless otherwise documented below in the visit note/SLS  

## 2013-05-17 NOTE — Progress Notes (Signed)
Patient ID: Jodi Frazier, female   DOB: Jan 17, 1931, 79 y.o.   MRN: [id number]  Patient presents to clinic today for 1 week follow-up of LH and near syncope.  Patient was taken off of amlodipine at last visit.  Since d/c of medication patient has noticed an improvement in positional lightheadedness.  Daughter reports home BP's are good when checked.  Patient had workup at last visit including labs and EKG that were negative for etiology of lightheadedness.  Patient was seen in ED for hip pain and had a CT head while there which was negative for lesion or bleed.  Patient was diagnosed with a UTI in the ED.  Was placed on Keflex QID.  Patient has not taken medicine in 1-2 days because it was making her nauseas, according to her daughter.  Patient was also on narcotic pain medication at that time, which patient also d/c'd due to nausea.  Daughter is unsure which medication caused the patient's symptoms.  Is wondering if there is a different medication that has less dosages for her mother to take because her mother has a hard time remembering to take so many times a day.   Past Medical History  Diagnosis Date  . Hypertension   . Colitis   . Coronary artery disease   . High cholesterol     takes Red Yeast Rice and Fish OIl daily  . Kidney stones   . PONV (postoperative nausea and vomiting)   . MI (myocardial infarction) 2010  . Shortness of breath     with exertion  . Pneumonia     hx of 20+yrs ago  . COPD (chronic obstructive pulmonary disease)     pleurisy or COPD exascerbation > 74yr ago  . Headache(784.0)   . Vertigo     HTN related bc gets up too fast  . Arthritis   . Bruises easily   . History of shingles 71yrs ago    in eye;occasionally gets some residual   . Accessory skin tags     arms/legs  . GERD (gastroesophageal reflux disease)   . Colitis   . Diverticulosis   . Urinary frequency   . Stress incontinence   . History of kidney stones   . Hypothyroidism     takes sYnthroid  daily  . Cataract     early stage on right  . Macular degeneration, wet   . Depression     some but takes Amitryptylline nightly  . Right-sided chest wall pain 08/28/12    PRESENTATION - RIGHT SIDED ANTERIOR CHEST PAIN  . Weight loss 08/28/12    10 LB WEIGHT LOSS OVER 3 MONTHS  . Lung mass 08/12/12    CHEST-XRAY/ PET -LOBULAR MASS LLL - 4.1 X 3.6 X 4.2 CM  . Dementia     mild  . S/P radiation therapy  10/05/2012-11/20/2012    Left Lower Lung and hilum / 70 Gy in 35 fractions  . Adenocarcinoma of lung 09/07/12    needle core bx-LLL-adenocarcinoma  . Hypertension     Current Outpatient Prescriptions on File Prior to Visit  Medication Sig Dispense Refill  . albuterol (PROVENTIL HFA;VENTOLIN HFA) 108 (90 BASE) MCG/ACT inhaler Inhale 2 puffs into the lungs every 6 (six) hours as needed for wheezing.  1 Inhaler  3  . amitriptyline (ELAVIL) 25 MG tablet Take 25 mg by mouth at bedtime.      Marland Kitchen aspirin 81 MG tablet Take 81 mg by mouth every evening.       Marland Kitchen  benzonatate (TESSALON) 100 MG capsule Take 1 capsule (100 mg total) by mouth 3 (three) times daily as needed for cough.  20 capsule  5  . carvedilol (COREG) 6.25 MG tablet Take 3.125 mg by mouth 2 (two) times daily with a meal.       . celecoxib (CELEBREX) 200 MG capsule Take 200 mg by mouth daily.      . cephALEXin (KEFLEX) 500 MG capsule Take 1 capsule (500 mg total) by mouth 4 (four) times daily.  28 capsule  0  . Cyanocobalamin (VITAMIN B-12 IJ) Inject 1,000 mcg/mL as directed every 30 (thirty) days.      . cycloSPORINE (RESTASIS) 0.05 % ophthalmic emulsion Place 1 drop into both eyes daily.      Marland Kitchen donepezil (ARICEPT) 10 MG tablet Take 10 mg by mouth at bedtime.      . Fluticasone Furoate-Vilanterol (BREO ELLIPTA) 100-25 MCG/INH AEPB Inhale 100 mcg into the lungs daily.  30 each  11  . Fluticasone-Salmeterol (ADVAIR DISKUS) 250-50 MCG/DOSE AEPB Inhale 1 puff into the lungs 2 (two) times daily.  60 each  3  . ibuprofen (ADVIL,MOTRIN) 800 MG  tablet Take 800 mg by mouth every 8 (eight) hours as needed for pain.      . meloxicam (MOBIC) 15 MG tablet Take 1 tablet (15 mg total) by mouth daily.  20 tablet  0  . mesalamine (LIALDA) 1.2 G EC tablet Take 1,200 mg by mouth daily with breakfast.       . methocarbamol (ROBAXIN) 500 MG tablet Take 1 tablet (500 mg total) by mouth 2 (two) times daily.  20 tablet  0  . naproxen sodium (ANAPROX) 220 MG tablet Take 220 mg by mouth 2 (two) times daily as needed (pain).      . nitroGLYCERIN (NITROSTAT) 0.4 MG SL tablet Place 0.4 mg under the tongue every 5 (five) minutes as needed for chest pain.       . pantoprazole (PROTONIX) 40 MG tablet Take 40 mg by mouth every morning.       . Red Yeast Rice 600 MG CAPS Take 2 capsules by mouth at bedtime.       No current facility-administered medications on file prior to visit.    Allergies  Allergen Reactions  . Codeine Hives    Family History  Problem Relation Age of Onset  . Anesthesia problems Mother   . Hypotension Neg Hx   . Malignant hyperthermia Neg Hx   . Pseudochol deficiency Neg Hx   . Cancer Maternal Aunt     breast    History   Social History  . Marital Status: Widowed    Spouse Name: N/A    Number of Children: 4  . Years of Education: N/A   Occupational History  . retired     Audiological scientist   Social History Main Topics  . Smoking status: Former Smoker -- 1.00 packs/day for 40 years    Types: Cigarettes    Quit date: 08/26/2002  . Smokeless tobacco: Never Used     Comment: quit several yrs ago  . Alcohol Use: Yes     Comment: 2 times week alcohol  . Drug Use: No  . Sexual Activity: No   Other Topics Concern  . None   Social History Narrative   Daughter is nurse in neuro ICU   ROS See HPI. All other ROS are negative.  Filed Vitals:   05/17/13 1016  BP: 96/58  Pulse: 97  Temp: 97.7  F (36.5 C)  Resp: 16   Physical Exam  Vitals reviewed. Constitutional: She is oriented to person, place, and time and  well-developed, well-nourished, and in no distress.  HENT:  Head: Normocephalic and atraumatic.  Eyes: Conjunctivae are normal. Pupils are equal, round, and reactive to light.  Neck: Neck supple.  Cardiovascular: Normal rate, regular rhythm, normal heart sounds and intact distal pulses.   Pulmonary/Chest: Effort normal and breath sounds normal. No respiratory distress. She has no wheezes. She has no rales. She exhibits no tenderness.  Neurological: She is alert and oriented to person, place, and time. No cranial nerve deficit.  Skin: Skin is warm and dry. No rash noted.  Psychiatric: Affect normal.   Recent Results (from the past 2160 hour(s))  BUN AND CREATININE (CC13)     Status: None   Collection Time    02/22/13  9:08 AM      Result Value Range   BUN 12.5  7.0 - 26.0 mg/dL   Creatinine 0.9  0.6 - 1.1 mg/dL  CBC     Status: Abnormal   Collection Time    04/24/13 10:29 AM      Result Value Range   WBC 8.8  4.0 - 10.5 K/uL   RBC 4.77  3.87 - 5.11 MIL/uL   Hemoglobin 14.1  12.0 - 15.0 g/dL   HCT 81.1  91.4 - 78.2 %   MCV 87.4  78.0 - 100.0 fL   MCH 29.6  26.0 - 34.0 pg   MCHC 33.8  30.0 - 36.0 g/dL   RDW 95.6 (*) 21.3 - 08.6 %   Platelets 233  150 - 400 K/uL  BASIC METABOLIC PANEL     Status: Abnormal   Collection Time    04/24/13 10:29 AM      Result Value Range   Sodium 132 (*) 135 - 145 mEq/L   Potassium 3.9  3.5 - 5.1 mEq/L   Chloride 96  96 - 112 mEq/L   CO2 24  19 - 32 mEq/L   Glucose, Bld 124 (*) 70 - 99 mg/dL   BUN 17  6 - 23 mg/dL   Creatinine, Ser 5.78  0.50 - 1.10 mg/dL   Calcium 46.9  8.4 - 62.9 mg/dL   GFR calc non Af Amer 49 (*) >90 mL/min   GFR calc Af Amer 57 (*) >90 mL/min   Comment: (NOTE)     The eGFR has been calculated using the CKD EPI equation.     This calculation has not been validated in all clinical situations.     eGFR's persistently <90 mL/min signify possible Chronic Kidney     Disease.  PRO B NATRIURETIC PEPTIDE     Status: None    Collection Time    04/24/13 10:29 AM      Result Value Range   Pro B Natriuretic peptide (BNP) 435.7  0 - 450 pg/mL  TROPONIN I     Status: None   Collection Time    04/24/13 10:29 AM      Result Value Range   Troponin I <0.30  <0.30 ng/mL   Comment:            Due to the release kinetics of cTnI,     a negative result within the first hours     of the onset of symptoms does not rule out     myocardial infarction with certainty.     If myocardial infarction is still suspected,  repeat the test at appropriate intervals.  POCT I-STAT TROPONIN I     Status: None   Collection Time    04/24/13 10:42 AM      Result Value Range   Troponin i, poc 0.02  0.00 - 0.08 ng/mL   Comment 3            Comment: Due to the release kinetics of cTnI,     a negative result within the first hours     of the onset of symptoms does not rule out     myocardial infarction with certainty.     If myocardial infarction is still suspected,     repeat the test at appropriate intervals.  MRSA PCR SCREENING     Status: None   Collection Time    04/24/13  3:38 PM      Result Value Range   MRSA by PCR NEGATIVE  NEGATIVE   Comment:            The GeneXpert MRSA Assay (FDA     approved for NASAL specimens     only), is one component of a     comprehensive MRSA colonization     surveillance program. It is not     intended to diagnose MRSA     infection nor to guide or     monitor treatment for     MRSA infections.  CBC     Status: None   Collection Time    04/25/13  3:45 AM      Result Value Range   WBC 7.3  4.0 - 10.5 K/uL   RBC 4.21  3.87 - 5.11 MIL/uL   Hemoglobin 12.1  12.0 - 15.0 g/dL   HCT 09.8  11.9 - 14.7 %   MCV 86.2  78.0 - 100.0 fL   MCH 28.7  26.0 - 34.0 pg   MCHC 33.3  30.0 - 36.0 g/dL   RDW 82.9  56.2 - 13.0 %   Platelets 201  150 - 400 K/uL  BASIC METABOLIC PANEL     Status: Abnormal   Collection Time    04/25/13  3:45 AM      Result Value Range   Sodium 131 (*) 135 - 145 mEq/L    Potassium 4.3  3.5 - 5.1 mEq/L   Chloride 99  96 - 112 mEq/L   CO2 24  19 - 32 mEq/L   Glucose, Bld 197 (*) 70 - 99 mg/dL   BUN 22  6 - 23 mg/dL   Creatinine, Ser 8.65  0.50 - 1.10 mg/dL   Calcium 78.4  8.4 - 69.6 mg/dL   GFR calc non Af Amer 51 (*) >90 mL/min   GFR calc Af Amer 59 (*) >90 mL/min   Comment: (NOTE)     The eGFR has been calculated using the CKD EPI equation.     This calculation has not been validated in all clinical situations.     eGFR's persistently <90 mL/min signify possible Chronic Kidney     Disease.  BASIC METABOLIC PANEL     Status: Abnormal   Collection Time    04/26/13  5:40 AM      Result Value Range   Sodium 137  135 - 145 mEq/L   Potassium 4.3  3.5 - 5.1 mEq/L   Chloride 105  96 - 112 mEq/L   CO2 23  19 - 32 mEq/L   Glucose, Bld 145 (*) 70 - 99 mg/dL  BUN 25 (*) 6 - 23 mg/dL   Creatinine, Ser 1.61  0.50 - 1.10 mg/dL   Calcium 9.3  8.4 - 09.6 mg/dL   GFR calc non Af Amer 60 (*) >90 mL/min   GFR calc Af Amer 70 (*) >90 mL/min   Comment: (NOTE)     The eGFR has been calculated using the CKD EPI equation.     This calculation has not been validated in all clinical situations.     eGFR's persistently <90 mL/min signify possible Chronic Kidney     Disease.  CBC     Status: Abnormal   Collection Time    04/26/13  5:40 AM      Result Value Range   WBC 14.7 (*) 4.0 - 10.5 K/uL   RBC 3.89  3.87 - 5.11 MIL/uL   Hemoglobin 11.5 (*) 12.0 - 15.0 g/dL   HCT 04.5 (*) 40.9 - 81.1 %   MCV 86.4  78.0 - 100.0 fL   MCH 29.6  26.0 - 34.0 pg   MCHC 34.2  30.0 - 36.0 g/dL   RDW 91.4  78.2 - 95.6 %   Platelets 235  150 - 400 K/uL  CBC WITH DIFFERENTIAL     Status: Abnormal   Collection Time    05/10/13  5:07 PM      Result Value Range   WBC 10.3  4.0 - 10.5 K/uL   RBC 4.51  3.87 - 5.11 MIL/uL   Hemoglobin 13.4  12.0 - 15.0 g/dL   HCT 21.3  08.6 - 57.8 %   MCV 86.9  78.0 - 100.0 fL   MCH 29.7  26.0 - 34.0 pg   MCHC 34.2  30.0 - 36.0 g/dL   RDW 46.9 (*)  62.9 - 15.5 %   Platelets 262  150 - 400 K/uL   Neutrophils Relative % 81 (*) 43 - 77 %   Neutro Abs 8.4 (*) 1.7 - 7.7 K/uL   Lymphocytes Relative 10 (*) 12 - 46 %   Lymphs Abs 1.0  0.7 - 4.0 K/uL   Monocytes Relative 7  3 - 12 %   Monocytes Absolute 0.8  0.1 - 1.0 K/uL   Eosinophils Relative 1  0 - 5 %   Eosinophils Absolute 0.1  0.0 - 0.7 K/uL   Basophils Relative 1  0 - 1 %   Basophils Absolute 0.1  0.0 - 0.1 K/uL   Smear Review       Comment: Mild left shift (1-5% metas, occ myelo, occ bands)     Elliptocytes     Platelets are unremarkable.  COMPREHENSIVE METABOLIC PANEL     Status: Abnormal   Collection Time    05/10/13  5:07 PM      Result Value Range   Sodium 137  135 - 145 mEq/L   Potassium 3.9  3.5 - 5.3 mEq/L   Chloride 100  96 - 112 mEq/L   CO2 24  19 - 32 mEq/L   Glucose, Bld 85  70 - 99 mg/dL   BUN 22  6 - 23 mg/dL   Creat 5.28  4.13 - 2.44 mg/dL   Total Bilirubin 0.8  0.3 - 1.2 mg/dL   Alkaline Phosphatase 93  39 - 117 U/L   AST 13  0 - 37 U/L   ALT 24  0 - 35 U/L   Total Protein 5.5 (*) 6.0 - 8.3 g/dL   Albumin 2.9 (*) 3.5 - 5.2 g/dL  Calcium 8.8  8.4 - 10.5 mg/dL  PRO B NATRIURETIC PEPTIDE     Status: None   Collection Time    05/10/13  5:07 PM      Result Value Range   Pro B Natriuretic peptide (BNP) 267.10  <451 pg/mL  TSH     Status: Abnormal   Collection Time    05/10/13  5:07 PM      Result Value Range   TSH 6.559 (*) 0.350 - 4.500 uIU/mL  URINALYSIS, ROUTINE W REFLEX MICROSCOPIC     Status: Abnormal   Collection Time    05/12/13  2:11 AM      Result Value Range   Color, Urine YELLOW  YELLOW   APPearance CLOUDY (*) CLEAR   Specific Gravity, Urine 1.022  1.005 - 1.030   pH 6.0  5.0 - 8.0   Glucose, UA NEGATIVE  NEGATIVE mg/dL   Hgb urine dipstick TRACE (*) NEGATIVE   Bilirubin Urine NEGATIVE  NEGATIVE   Ketones, ur NEGATIVE  NEGATIVE mg/dL   Protein, ur NEGATIVE  NEGATIVE mg/dL   Urobilinogen, UA 0.2  0.0 - 1.0 mg/dL   Nitrite NEGATIVE   NEGATIVE   Leukocytes, UA MODERATE (*) NEGATIVE  URINE MICROSCOPIC-ADD ON     Status: Abnormal   Collection Time    05/12/13  2:11 AM      Result Value Range   Squamous Epithelial / LPF RARE  RARE   WBC, UA 11-20  <3 WBC/hpf   RBC / HPF 0-2  <3 RBC/hpf   Bacteria, UA MANY (*) RARE  URINE CULTURE     Status: None   Collection Time    05/12/13  2:11 AM      Result Value Range   Specimen Description URINE, RANDOM     Special Requests NONE     Culture  Setup Time       Value: 05/12/2013 15:31     Performed at [facility]   Culture       Value: >=100,000 COLONIES/mL ESCHERICHIA COLI     Performed at [facility]   Report Status 05/14/2013 FINAL     Organism ID, Bacteria ESCHERICHIA COLI     Assessment/Plan: HTN (hypertension) BP 98/56 in clinic today.  Will keep patient off of Amlodipine.  Encouraged fluid intake given BP today.   Near syncope BP 98/56 in clinic today.  Will keep patient off of Amlodipine.  Encouraged fluid intake given BP today.  Instructed patient on appropriate way to change position while preventing lightheadedness.  Patient's symptoms improved from last visit.  Hypothyroidism TSH elevated.  Increase synthroid to 125 mcg daily.  Follow-up in 1 month.

## 2013-05-17 NOTE — Assessment & Plan Note (Signed)
TSH elevated.  Increase synthroid to 125 mcg daily.  Follow-up in 1 month.

## 2013-05-17 NOTE — Assessment & Plan Note (Signed)
BP 98/56 in clinic today.  Will keep patient off of Amlodipine.  Encouraged fluid intake given BP today.  Instructed patient on appropriate way to change position while preventing lightheadedness.  Patient's symptoms improved from last visit.

## 2013-05-17 NOTE — Patient Instructions (Signed)
Please take antibiotic as prescribed. Please take new dose of Syntrhoid daily.  I want to see you in 1 month to recheck thyroid tests.  Please follow-up with specialists.  Continue watching BP and stay well hydrated.

## 2013-05-17 NOTE — Telephone Encounter (Signed)
Discussed at visit 11.17.14/SLS

## 2013-06-01 ENCOUNTER — Ambulatory Visit (HOSPITAL_COMMUNITY)
Admission: RE | Admit: 2013-06-01 | Discharge: 2013-06-01 | Disposition: A | Discharge status: Home / Self Care | Payer: Medicare Other | Source: Ambulatory Visit | Attending: Radiation Oncology | Admitting: Radiation Oncology

## 2013-06-01 ENCOUNTER — Other Ambulatory Visit: Payer: Self-pay | Admitting: Radiation Oncology

## 2013-06-01 DIAGNOSIS — R911 Solitary pulmonary nodule: Secondary | ICD-10-CM | POA: Insufficient documentation

## 2013-06-01 DIAGNOSIS — C801 Malignant (primary) neoplasm, unspecified: Secondary | ICD-10-CM | POA: Insufficient documentation

## 2013-06-01 DIAGNOSIS — N281 Cyst of kidney, acquired: Secondary | ICD-10-CM | POA: Insufficient documentation

## 2013-06-01 DIAGNOSIS — J189 Pneumonia, unspecified organism: Secondary | ICD-10-CM

## 2013-06-01 DIAGNOSIS — Z923 Personal history of irradiation: Secondary | ICD-10-CM | POA: Insufficient documentation

## 2013-06-01 DIAGNOSIS — C3492 Malignant neoplasm of unspecified part of left bronchus or lung: Secondary | ICD-10-CM

## 2013-06-01 DIAGNOSIS — Z96649 Presence of unspecified artificial hip joint: Secondary | ICD-10-CM | POA: Insufficient documentation

## 2013-06-01 MED ORDER — IOHEXOL 300 MG/ML  SOLN
100.0000 mL | Freq: Once | INTRAMUSCULAR | Status: AC | PRN
  Administered 2013-06-01: 100 mL via INTRAVENOUS

## 2013-06-07 ENCOUNTER — Encounter: Payer: Self-pay | Admitting: Physician Assistant

## 2013-06-07 ENCOUNTER — Ambulatory Visit (INDEPENDENT_AMBULATORY_CARE_PROVIDER_SITE_OTHER): Payer: Medicare Other | Admitting: Physician Assistant

## 2013-06-07 VITALS — BP 108/68 | HR 70 | Temp 97.7°F | Ht 71.0 in | Wt 180.0 lb

## 2013-06-07 DIAGNOSIS — N390 Urinary tract infection, site not specified: Secondary | ICD-10-CM

## 2013-06-07 DIAGNOSIS — F419 Anxiety disorder, unspecified: Secondary | ICD-10-CM

## 2013-06-07 DIAGNOSIS — F411 Generalized anxiety disorder: Secondary | ICD-10-CM

## 2013-06-07 DIAGNOSIS — R110 Nausea: Secondary | ICD-10-CM

## 2013-06-07 LAB — POCT URINALYSIS DIPSTICK
Bilirubin, UA: NEGATIVE
Blood, UA: NEGATIVE
Glucose, UA: NEGATIVE
Ketones, UA: NEGATIVE
Nitrite, UA: NEGATIVE
Protein, UA: NEGATIVE
Spec Grav, UA: 1.01
Urobilinogen, UA: NEGATIVE
pH, UA: 5

## 2013-06-07 LAB — CBC WITH DIFFERENTIAL/PLATELET
Basophils Absolute: 0.1 10*3/uL (ref 0.0–0.1)
Basophils Relative: 2 % — ABNORMAL HIGH (ref 0–1)
Eosinophils Absolute: 0.2 10*3/uL (ref 0.0–0.7)
Eosinophils Relative: 2 % (ref 0–5)
HCT: 36.3 % (ref 36.0–46.0)
Hemoglobin: 12.5 g/dL (ref 12.0–15.0)
Lymphocytes Relative: 14 % (ref 12–46)
Lymphs Abs: 1.1 10*3/uL (ref 0.7–4.0)
MCH: 29.3 pg (ref 26.0–34.0)
MCHC: 34.4 g/dL (ref 30.0–36.0)
MCV: 85 fL (ref 78.0–100.0)
Monocytes Absolute: 0.9 10*3/uL (ref 0.1–1.0)
Monocytes Relative: 11 % (ref 3–12)
Neutro Abs: 5.8 10*3/uL (ref 1.7–7.7)
Neutrophils Relative %: 71 % (ref 43–77)
Platelets: 430 10*3/uL — ABNORMAL HIGH (ref 150–400)
RBC: 4.27 MIL/uL (ref 3.87–5.11)
RDW: 17.3 % — ABNORMAL HIGH (ref 11.5–15.5)
WBC: 8 10*3/uL (ref 4.0–10.5)

## 2013-06-07 MED ORDER — ESCITALOPRAM OXALATE 5 MG PO TABS: 5.0000 mg | ORAL_TABLET | Freq: Every day | ORAL | Status: DC

## 2013-06-07 MED ORDER — ONDANSETRON HCL 4 MG PO TABS: 4.0000 mg | ORAL_TABLET | Freq: Three times a day (TID) | ORAL | Status: DC | PRN

## 2013-06-07 NOTE — Progress Notes (Signed)
Pre visit review using our clinic review tool, if applicable. No additional management support is needed unless otherwise documented below in the visit note. 

## 2013-06-07 NOTE — Patient Instructions (Addendum)
Please continue to increase fluid intake.  Take a ginger capsule before meals to help with nausea.  Take a zofran for severe nausea.  Can restart the amlodipine giving elevated BP at home.  Follow-up in 1-2 weeks for BP recheck and symptoms reassessment.  Will begin Lexapro 5 mg for anxiety -- take 1 tablet daily.  Nausea, Adult Nausea is the feeling that you have an upset stomach or have to vomit. Nausea by itself is not likely a serious concern, but it may be an early sign of more serious medical problems. As nausea gets worse, it can lead to vomiting. If vomiting develops, there is the risk of dehydration.  CAUSES   Viral infections.  Food poisoning.  Medicines.  Pregnancy.  Motion sickness.  Migraine headaches.  Emotional distress.  Severe pain from any source.  Alcohol intoxication. HOME CARE INSTRUCTIONS  Get plenty of rest.  Ask your caregiver about specific rehydration instructions.  Eat small amounts of food and sip liquids more often.  Take all medicines as told by your caregiver. SEEK MEDICAL CARE IF:  You have not improved after 2 days, or you get worse.  You have a headache. SEEK IMMEDIATE MEDICAL CARE IF:   You have a fever.  You faint.  You keep vomiting or have blood in your vomit.  You are extremely weak or dehydrated.  You have dark or bloody stools.  You have severe chest or abdominal pain. MAKE SURE YOU:  Understand these instructions.  Will watch your condition.  Will get help right away if you are not doing well or get worse. Document Released: 07/25/2004 Document Revised: 03/11/2012 Document Reviewed: 02/27/2011 [facility] Patient Information 2014 [place], [place].

## 2013-06-08 ENCOUNTER — Ambulatory Visit: Payer: Medicare Other | Admitting: Radiation Oncology

## 2013-06-08 LAB — COMPREHENSIVE METABOLIC PANEL
Albumin: 3.5 g/dL (ref 3.5–5.2)
BUN: 14 mg/dL (ref 6–23)
CO2: 26 mEq/L (ref 19–32)
Calcium: 10.1 mg/dL (ref 8.4–10.5)
Chloride: 101 mEq/L (ref 96–112)
Creat: 0.9 mg/dL (ref 0.50–1.10)
Potassium: 4.4 mEq/L (ref 3.5–5.3)

## 2013-06-10 ENCOUNTER — Other Ambulatory Visit: Payer: Self-pay | Admitting: Radiation Therapy

## 2013-06-10 ENCOUNTER — Encounter: Payer: Self-pay | Admitting: Radiation Oncology

## 2013-06-10 ENCOUNTER — Encounter: Payer: Self-pay | Admitting: Emergency Medicine

## 2013-06-10 ENCOUNTER — Ambulatory Visit
Admission: RE | Admit: 2013-06-10 | Discharge: 2013-06-10 | Disposition: A | Discharge status: Home / Self Care | Payer: Medicare Other | Source: Ambulatory Visit | Attending: Radiation Oncology | Admitting: Radiation Oncology

## 2013-06-10 ENCOUNTER — Ambulatory Visit (INDEPENDENT_AMBULATORY_CARE_PROVIDER_SITE_OTHER): Payer: Medicare Other | Admitting: Emergency Medicine

## 2013-06-10 ENCOUNTER — Telehealth: Payer: Self-pay | Admitting: Physician Assistant

## 2013-06-10 ENCOUNTER — Other Ambulatory Visit: Payer: Self-pay

## 2013-06-10 ENCOUNTER — Encounter (INDEPENDENT_AMBULATORY_CARE_PROVIDER_SITE_OTHER): Payer: Self-pay

## 2013-06-10 ENCOUNTER — Ambulatory Visit: Payer: Medicare Other | Admitting: Physician Assistant

## 2013-06-10 VITALS — BP 149/69 | HR 71 | Temp 97.3°F | Resp 20 | Wt 180.2 lb

## 2013-06-10 VITALS — BP 120/68 | HR 76 | Ht 71.0 in | Wt 182.0 lb

## 2013-06-10 DIAGNOSIS — C3490 Malignant neoplasm of unspecified part of unspecified bronchus or lung: Secondary | ICD-10-CM

## 2013-06-10 DIAGNOSIS — J449 Chronic obstructive pulmonary disease, unspecified: Secondary | ICD-10-CM

## 2013-06-10 DIAGNOSIS — K219 Gastro-esophageal reflux disease without esophagitis: Secondary | ICD-10-CM

## 2013-06-10 DIAGNOSIS — Z510 Encounter for antineoplastic radiation therapy: Secondary | ICD-10-CM | POA: Insufficient documentation

## 2013-06-10 DIAGNOSIS — R110 Nausea: Secondary | ICD-10-CM

## 2013-06-10 LAB — CULTURE, URINE COMPREHENSIVE: Colony Count: >=100000

## 2013-06-10 LAB — BUN AND CREATININE (CC13): Creatinine: 1 mg/dL (ref 0.6–1.1)

## 2013-06-10 MED ORDER — AMOXICILLIN-POT CLAVULANATE 875-125 MG PO TABS: 1.0000 | ORAL_TABLET | Freq: Two times a day (BID) | ORAL | Status: DC

## 2013-06-10 NOTE — Assessment & Plan Note (Signed)
-   continue Breo + SABA

## 2013-06-10 NOTE — Progress Notes (Signed)
Radiation Oncology         ([phone]) [phone] ________________________________  Name: TENECIA IGNASIAK MRN: [id number]  Date: 06/10/2013  DOB: July 25, 1930  Follow-Up Visit Note  CC: Piedad Climes, PA-C  Alleen Borne, MD  Diagnosis:   79 yo woman with Left lower lung poorly differentiated adenocarcinoma, T2aN1M0 STAGE IIA, s/p external radiation to 70 Gy through 11/20/12  Interval Since Last Radiation:  11  months  Narrative:  The patient returns today for routine follow-up.  She is fatigued, weak, and reports that her appetite is so so.  She experiences saves of nausea after eating a few bites.  She plans to stop protonix temporarily per Dr. Delton Coombes seen today at his office.  CT chest 06/01/13 results in enlarging RUL nodule.  She fell at home 3 weeks ago, hurt right hip when she was reaching up to change a light bulb and next thing she knew she was on the floor looking up at female family member.  She lost consciuosness, went to hospital 2 days later.  She has Physical therapy at home.  She is using walker now at home                               ALLERGIES:  is allergic to codeine.  Meds: Current Outpatient Prescriptions  Medication Sig Dispense Refill  . albuterol (PROVENTIL HFA;VENTOLIN HFA) 108 (90 BASE) MCG/ACT inhaler Inhale 2 puffs into the lungs every 6 (six) hours as needed for wheezing.  1 Inhaler  3  . amitriptyline (ELAVIL) 25 MG tablet Take 25 mg by mouth at bedtime.      Marland Kitchen amLODipine (NORVASC) 2.5 MG tablet Take 2.5 mg by mouth daily.      Marland Kitchen amoxicillin-clavulanate (AUGMENTIN) 875-125 MG per tablet Take 1 tablet by mouth 2 (two) times daily.  14 tablet  0  . aspirin 81 MG tablet Take 81 mg by mouth every evening.       . benzonatate (TESSALON) 100 MG capsule Take 1 capsule (100 mg total) by mouth 3 (three) times daily as needed for cough.  20 capsule  5  . carvedilol (COREG) 6.25 MG tablet Take 3.125 mg by mouth 2 (two) times daily with a meal.       . Cyanocobalamin  (VITAMIN B-12 IJ) Inject 1,000 mcg/mL as directed every 30 (thirty) days.      . cycloSPORINE (RESTASIS) 0.05 % ophthalmic emulsion Place 1 drop into both eyes daily.      Marland Kitchen donepezil (ARICEPT) 10 MG tablet Take 10 mg by mouth at bedtime.      Marland Kitchen escitalopram (LEXAPRO) 5 MG tablet Take 1 tablet (5 mg total) by mouth daily.  30 tablet  1  . Fluticasone Furoate-Vilanterol (BREO ELLIPTA) 100-25 MCG/INH AEPB Inhale 100 mcg into the lungs daily.  30 each  11  . ibuprofen (ADVIL,MOTRIN) 800 MG tablet Take 800 mg by mouth every 8 (eight) hours as needed for pain.      Marland Kitchen levothyroxine (SYNTHROID) 125 MCG tablet Take 1 tablet (125 mcg total) by mouth daily before breakfast.  30 tablet  2  . mesalamine (LIALDA) 1.2 G EC tablet Take 1,200 mg by mouth daily with breakfast.       . naproxen sodium (ANAPROX) 220 MG tablet Take 220 mg by mouth 2 (two) times daily as needed (pain).      . nitroGLYCERIN (NITROSTAT) 0.4 MG SL tablet Place 0.4 mg  under the tongue every 5 (five) minutes as needed for chest pain.       Marland Kitchen ondansetron (ZOFRAN) 4 MG tablet Take 1 tablet (4 mg total) by mouth every 8 (eight) hours as needed for nausea or vomiting.  20 tablet  0  . pantoprazole (PROTONIX) 40 MG tablet Take 40 mg by mouth every morning.        No current facility-administered medications for this encounter.    Physical Findings: The patient is in no acute distress. Patient is alert and oriented.   In wheelchair today.  weight is 180 lb 3.2 oz (81.738 kg). Her oral temperature is 97.3 F (36.3 C). Her blood pressure is 149/69 and her pulse is 71. Her respiration is 20 and oxygen saturation is 98%. .  No significant changes.  Lab Findings: Lab Results  Component Value Date   WBC 8.0 06/07/2013   HGB 12.5 06/07/2013   HCT 36.3 06/07/2013   MCV 85.0 06/07/2013   PLT 430* 06/07/2013   Radiographic Findings: Dg Chest 2 View  05/12/2013   CLINICAL DATA:  Fall, hip pain.  EXAM: CHEST  2 VIEW  COMPARISON:  Chest radiograph  May 04, 2013 and CT of the chest April 24, 2013  FINDINGS: Cardiac silhouette is unremarkable, similar chronic interstitial changes, with more fullness in strandy densities in left hilum, similar. Left greater than right apical pleural thickening. Patient's known  mass in left lung base is better seen on prior CT. No pneumothorax.  Moderate calcific atherosclerosis of the aortic knob. Patient is osteopenic.  IMPRESSION: No active cardiopulmonary disease.  Chronic interstitial changes with apical pleural thickening, known left lung mass better seen on prior CT with similar fullness of the left hilum.   Electronically Signed   By: Awilda Metro   On: 05/12/2013 04:27   Dg Hip Complete Right  05/12/2013   CLINICAL DATA:  Right hip pain following a fall two days ago.  EXAM: RIGHT HIP - COMPLETE 2+ VIEW  COMPARISON:  None.  FINDINGS: Like total hip prosthesis. The most distal portion of the prosthesis is not included. No fracture or dislocation seen. Diffuse osteopenia.  IMPRESSION: Right total hip prosthesis without fracture or dislocation. The most distal portion of the femoral component is not included. If there is a clinical concern for a fracture at that level, repeat views to include the distal portion would be recommended.   Electronically Signed   By: Gordan Payment M.D.   On: 05/12/2013 01:57   Ct Head Wo Contrast  05/12/2013   CLINICAL DATA:  Headache.  Multiple falls.  EXAM: CT HEAD WITHOUT CONTRAST  TECHNIQUE: Contiguous axial images were obtained from the base of the skull through the vertex without intravenous contrast.  COMPARISON:  Brain MR dated 09/07/2012.  FINDINGS: Stable prominence subarachnoid spaces and minimally prominent ventricles. No intracranial hemorrhage, mass lesion or CT evidence of acute infarction. No skull fractures or paranasal sinus air-fluid levels.  IMPRESSION: Stable atrophy.  No acute abnormality.   Electronically Signed   By: Gordan Payment M.D.   On: 05/12/2013 02:37    Ct Chest W Contrast  06/01/2013   CLINICAL DATA:  Adenocarcinoma along the radiation therapy complete.  EXAM: CT CHEST AND ABDOMEN WITH CONTRAST  TECHNIQUE: Multidetector CT imaging of the chest and abdomen was performed following the standard protocol during bolus administration of intravenous contrast.  CONTRAST:  OMNIPAQUE IOHEXOL 300 MG/ML  SOLN  COMPARISON:  CT chest 04/24/2013, 02/22/2013  FINDINGS: CT  CHEST FINDINGS  There is interval resolution of the left pneumothorax seen on prior. Nodule within the left lower lobe measures 16 x 19 mm compared to 15 x 18 mm on prior for no significant change. Peripheral linear nodularity in the lingula is similar. There is small left effusion which is unchanged.  In the right upper lobe, irregular nodule measures 11 mm compared to 8 mm on prior (image 17). There is biapical pleural parenchymal thickening posteriorly not changed from prior.  Small paratracheal lymph nodes are unchanged and less than 10 mm. No supraclavicular lymphadenopathy. No axillary adenopathy.  CT ABDOMEN FINDINGS  No focal hepatic lesion. The gallbladder, pancreas, spleen, adrenal glands, and kidneys are unchanged. There are simple cysts within the left and right kidney. There is a linear calcification in the right renal collecting system seen best on the coronal images. There is indeterminate on prior CT.  Stomach, small bowel and cecum are normal. Diverticulum of the descending colon.  No retroperitoneal periportal lymphadenopathy.  Within the pelvis, there is a rounded cystic lesion associated with the right adnexa measuring 3.6 cm is again noted. No aggressive osseous lesion. Right hip arthroplasty noted. (The pelvis was image by mistake and not charge to the patient).  IMPRESSION: 1. Left upper lobe nodule is stable compared to prior. 2. Right upper lobe nodule is increased in size and concerning for bronchogenic carcinoma. 3. Stable linear thickening in the  left upper lobe. 4. Linear  density in the right renal collecting system likely represents a calculus. Bilateral nonenhancing renal cysts. 5. No evidence metastasis below diaphragms. 6. Stable right adnexal cystic lesion.   Electronically Signed   By: Genevive Bi M.D.   On: 06/01/2013 16:55   Ct Abdomen Pelvis W Contrast  06/01/2013   CLINICAL DATA:  Adenocarcinoma along the radiation therapy complete.  EXAM: CT CHEST AND ABDOMEN WITH CONTRAST  TECHNIQUE: Multidetector CT imaging of the chest and abdomen was performed following the standard protocol during bolus administration of intravenous contrast.  CONTRAST:  OMNIPAQUE IOHEXOL 300 MG/ML  SOLN  COMPARISON:  CT chest 04/24/2013, 02/22/2013  FINDINGS: CT CHEST FINDINGS  There is interval resolution of the left pneumothorax seen on prior. Nodule within the left lower lobe measures 16 x 19 mm compared to 15 x 18 mm on prior for no significant change. Peripheral linear nodularity in the lingula is similar. There is small left effusion which is unchanged.  In the right upper lobe, irregular nodule measures 11 mm compared to 8 mm on prior (image 17). There is biapical pleural parenchymal thickening posteriorly not changed from prior.  Small paratracheal lymph nodes are unchanged and less than 10 mm. No supraclavicular lymphadenopathy. No axillary adenopathy.  CT ABDOMEN FINDINGS  No focal hepatic lesion. The gallbladder, pancreas, spleen, adrenal glands, and kidneys are unchanged. There are simple cysts within the left and right kidney. There is a linear calcification in the right renal collecting system seen best on the coronal images. There is indeterminate on prior CT.  Stomach, small bowel and cecum are normal. Diverticulum of the descending colon.  No retroperitoneal periportal lymphadenopathy.  Within the pelvis, there is a rounded cystic lesion associated with the right adnexa measuring 3.6 cm is again noted. No aggressive osseous lesion. Right hip arthroplasty noted. (The  pelvis was image by mistake and not charge to the patient).  IMPRESSION: 1. Left upper lobe nodule is stable compared to prior. 2. Right upper lobe nodule is increased in size and concerning  for bronchogenic carcinoma. 3. Stable linear thickening in the  left upper lobe. 4. Linear density in the right renal collecting system likely represents a calculus. Bilateral nonenhancing renal cysts. 5. No evidence metastasis below diaphragms. 6. Stable right adnexal cystic lesion.   Electronically Signed   By: Genevive Bi M.D.   On: 06/01/2013 16:55   Ct Hip Right Wo Contrast  05/12/2013   CLINICAL DATA:  Fall, right hip pain.  EXAM: CT OF THE RIGHT HIP WITHOUT CONTRAST  TECHNIQUE: Multidetector CT imaging was performed according to the standard protocol. Multiplanar CT image reconstructions were also generated.  COMPARISON:  Right hip radiograph May 12, 2013  FINDINGS: The status post right hip total arthroplasty, hardware results in extensive streak artifact, the most caudal aspect of the right femoral stem is not imaged. Focal osteopenia about the right acetabular component concerning for small particle disease. No fracture.  Limited view of the pelvis demonstrates diverticulosis with mild inflammatory changes, equivocal for acute diverticulitis. 3.5 cm round right at on the axial fluid density (1 Hounsfield unit) cyst. Status post apparent hysterectomy.  No evidence of the disproportionate muscle atrophy nor denervation.  IMPRESSION: Status post right hip total arthroplasty with focal osteopenia about the acetabular cup concerning for small particle disease.  No acute fracture deformity or dislocation.  Partially imaged diverticulosis, equivocal findings for acute diverticulitis, incompletely characterized.  3.5 cm right adnexal cyst for which further characterization with ultrasound, on a nonemergent basis, is recommended. This recommendation follows ACR consensus guidelines: White Paper of the ACR  Incidental Findings Committee II on Adnexal Findings. J Am Coll Radiol [phone].   Electronically Signed   By: Awilda Metro   On: 05/12/2013 04:21   Impression:  The patient presents today following previous left lower lung radiotherapy primarily to question whether radiotherapy can be used for what is now described as an enlarging right upper lung nodule measuring 11 mm. However, her symptoms of losing consciousness and waves of nausea raise some concern for possible brain metastases as well. At this point, I would want to rule out brain metastases and other body metastases prior to committing to the course of definitive stereotactic body radiotherapy for what appears to be an isolated oligo metastasis.  Plan:  I talked to the patient and her daughters about the recent symptoms and the results of her recent scans. I shared my concern regarding brain metastases and the need for overall restaging. The patient and her family understood and will proceed to brain MRI ASAP followed by PET/CT for restaging purposes. Once these results are available, I will look forward to following up with the patient and her daughters regarding treatment options.  _____________________________________  Artist Pais Kathrynn Running, M.D.

## 2013-06-10 NOTE — Assessment & Plan Note (Signed)
CT scan 12/2 suggestive of enlarging RUL nodule, stable L sided nodule. She is to follow with Rad-Onc today. ? Whether she could have more XRT to the R side.

## 2013-06-10 NOTE — Patient Instructions (Signed)
Follow with Radiation oncology as planned to review your CT scan  Continue breo daily Have albuterol available to use as needed Temporarily stop protonix to see if your nausea gets better Follow with Dr Delton Coombes in 4 months or sooner if you have any problems.

## 2013-06-10 NOTE — Progress Notes (Signed)
Subjective:    Patient ID: Jodi Frazier, female    DOB: 01/04/1931, 79 y.o.   MRN: [id number]  HPI 79 yo woman, former smoker (40 pk-yrs), HTN, CAD, adenoCA LLL s/p XRT 3-5/'14, COPD, rhinitis, GERD. She is on Advair + albuterol prn. She has had some increased cough and dyspnea since the end of XRT. Her CT scan chest done 02/22/13 shows shrinkage of LLL mass but some associated GGI in the L lung. She was just started on a pred taper by Dr Karoline Caldwell for this.   ROV 04/08/13 -- hx adenoCA LLL, COPD. She has undergone XRT to LLL lesion, was seen last time with some associated GGI suspicious for radiation pneumonitis. We treated with prednisone, she never tapered to 40mg , has been on 60mg  qd. She is coughing more since last time. Taking advair qd, albuterol prn (2x a week).   ROV 05/04/13 -- hx adenoCA LLL, COPD. She has undergone XRT to LLL lesion, has been rx with pred for suspected radiation pneumonitis / GGI.  At last visit we tapered prednisone to off. We also stopped Advair and start of Breo. We also started protonix and added an allergy regimen for her cough. She has been admitted in the interim for PTX, received more prednisone and is currently tapering. CXR today shows some clearing. She believes that Virgel Bouquet is helping her. She still has nasal gtt.   ROV 06/10/13 --  COPD, hx adenoCA LLL s/p XRT and treated for radiation pneumonitis in 10-05/2013. Hx PTX.Marland Kitchen Also with cough > protonix. ? Loratadine and chlorpheniramine > stopped these. She c/o nausea today, ? Related to increasing the protonic to qd, ? Breo. Her cough appears to better controlled.    Review of Systems  Constitutional: Negative for fever and unexpected weight change.  HENT: Positive for rhinorrhea. Negative for congestion, dental problem, ear pain, nosebleeds, postnasal drip, sinus pressure, sneezing, sore throat and trouble swallowing.   Eyes: Negative for redness and itching.  Respiratory: Positive for cough and shortness of  breath. Negative for chest tightness and wheezing.   Cardiovascular: Negative for palpitations and leg swelling.  Gastrointestinal: Negative for nausea and vomiting.  Genitourinary: Negative for dysuria.  Musculoskeletal: Negative for joint swelling.  Skin: Negative for rash.  Neurological: Positive for light-headedness. Negative for headaches.  Hematological: Does not bruise/bleed easily.  Psychiatric/Behavioral: Negative for dysphoric mood. The patient is not nervous/anxious.      Filed Vitals:   06/10/13 1339  BP: 120/68  Pulse: 76  Height: 5\' 11"  (1.803 m)  Weight: 182 lb (82.555 kg)  SpO2: 94%    Gen: Pleasant, well-nourished, in no distress,  normal affect  ENT: No lesions,  mouth clear,  oropharynx clear, no postnasal drip  Neck: No JVD, no TMG, no carotid bruits  Lungs: No use of accessory muscles, no dullness to percussion, clear without rales or rhonchi  Cardiovascular: RRR, heart sounds normal, no murmur or gallops, no peripheral edema  Musculoskeletal: No deformities, no cyanosis or clubbing  Neuro: alert, non focal  Skin: Warm, no lesions or rashes     Objective:   Physical Exam Filed Vitals:   06/10/13 1339  BP: 120/68  Pulse: 76  Height: 5\' 11"  (1.803 m)  Weight: 182 lb (82.555 kg)  SpO2: 94%       06/01/13 CT chest -- COMPARISON: CT chest 04/24/2013, 02/22/2013  FINDINGS:  CT CHEST FINDINGS  There is interval resolution of the left pneumothorax seen on prior.  Nodule within the left lower  lobe measures 16 x 19 mm compared to 15  x 18 mm on prior for no significant change. Peripheral linear  nodularity in the lingula is similar. There is small left effusion  which is unchanged.  In the right upper lobe, irregular nodule measures 11 mm compared to  8 mm on prior (image 17). There is biapical pleural parenchymal  thickening posteriorly not changed from prior.  Small paratracheal lymph nodes are unchanged and less than 10 mm. No   supraclavicular lymphadenopathy. No axillary adenopathy.  CT ABDOMEN FINDINGS  No focal hepatic lesion. The gallbladder, pancreas, spleen, adrenal  glands, and kidneys are unchanged. There are simple cysts within the  left and right kidney. There is a linear calcification in the right  renal collecting system seen best on the coronal images. There is  indeterminate on prior CT.  Stomach, small bowel and cecum are normal. Diverticulum of the  descending colon.  No retroperitoneal periportal lymphadenopathy.  Within the pelvis, there is a rounded cystic lesion associated with  the right adnexa measuring 3.6 cm is again noted. No aggressive  osseous lesion. Right hip arthroplasty noted. (The pelvis was image  by mistake and not charge to the patient).  IMPRESSION:  1. Left upper lobe nodule is stable compared to prior.  2. Right upper lobe nodule is increased in size and concerning for  bronchogenic carcinoma.  3. Stable linear thickening in the left upper lobe.  4. Linear density in the right renal collecting system likely  represents a calculus. Bilateral nonenhancing renal cysts.  5. No evidence metastasis below diaphragms.  6. Stable right adnexal cystic lesion.      Assessment & Plan:  Adenocarcinoma of lung CT scan 12/2 suggestive of enlarging RUL nodule, stable L sided nodule. She is to follow with Rad-Onc today. ? Whether she could have more XRT to the R side.   COPD (chronic obstructive pulmonary disease) - continue Breo + SABA  GERD (gastroesophageal reflux disease) - temporarily stop protonix to see if cough tolerates and the nausea gets better.

## 2013-06-10 NOTE — Telephone Encounter (Signed)
Left message on answering machine to return call

## 2013-06-10 NOTE — Assessment & Plan Note (Signed)
-   temporarily stop protonix to see if cough tolerates and the nausea gets better.

## 2013-06-10 NOTE — Progress Notes (Addendum)
F/u LULRad txs 10/05/12-11/20/12, 98% room air sats, in w/c, fatigued, weak, appetite so so,  Waves of nausea after eating a few bites, will stop protonix temporarily per Dr. Delton Coombes seen today at his office, Ct chest 06/01/13 results in  enlatging RUL nodule, fell at home 3 weeks ago, hurt right hip, was reaching up to change a light bulb  And next thing she knew she was on the floor looking up at feamle family member(passed out), went to hospital 2 days later, has Physical therapy at home, no fx on hip Using walker now at home 2:57 PM  To see if any more rad txs can be done,

## 2013-06-10 NOTE — Telephone Encounter (Signed)
Please inform patient that her urine still shows bacteria.  I will send in an Rx for Augmentin for her to take as prescribed with food.  This could be the cause of her nausea.  Also, I would like to recheck her liver function at her visit in 1 week, because her Alk Phos was elevated.  This could potentially be a lab error.  If level is still elevated, we may need to get an ultrasound of her abdomen.

## 2013-06-11 ENCOUNTER — Ambulatory Visit
Admission: RE | Admit: 2013-06-11 | Discharge: 2013-06-11 | Disposition: A | Discharge status: Home / Self Care | Payer: Medicare Other | Source: Ambulatory Visit | Attending: Radiation Oncology | Admitting: Radiation Oncology

## 2013-06-11 ENCOUNTER — Telehealth: Payer: Self-pay | Admitting: *Deleted

## 2013-06-11 DIAGNOSIS — R110 Nausea: Secondary | ICD-10-CM

## 2013-06-11 DIAGNOSIS — C3490 Malignant neoplasm of unspecified part of unspecified bronchus or lung: Secondary | ICD-10-CM

## 2013-06-11 MED ORDER — GADOBENATE DIMEGLUMINE 529 MG/ML IV SOLN
17.0000 mL | Freq: Once | INTRAVENOUS | Status: AC | PRN
  Administered 2013-06-11: 17 mL via INTRAVENOUS

## 2013-06-11 NOTE — Telephone Encounter (Signed)
Called patient to inform of test, lvm for a return call 

## 2013-06-13 ENCOUNTER — Encounter: Payer: Self-pay | Admitting: Radiation Oncology

## 2013-06-13 NOTE — Addendum Note (Signed)
Encounter addended by: Oneita Hurt, MD on: 06/13/2013  4:04 PM<BR>     Documentation filed: Inpatient Document Flowsheet

## 2013-06-14 NOTE — Telephone Encounter (Signed)
May we please try to reach patient again?

## 2013-06-15 NOTE — Progress Notes (Signed)
Patient ID: Jodi Frazier, female   DOB: 05-Jul-1930, 79 y.o.   MRN: [id number]  Patient presents to clinic today with her daughter c/o elevated BP and nausea over the past couple of weeks.  Patient had been seen previously for positional lightheadedness.  Her amlodipine was d/c given BP in 100/70 in clinic without taking medication.  Daughter states patient has had several days of BP running in the 180s/100s.  Patient denies headache, vision changes, chest pain, shortness of breath or palpitations.  Does endorse anxiety.  Patient has adenocarcinoma of the lung which is worsening.  Feels she has no control of her life anymore.  Patient endorses intermittent nausea with one episode of nonbloody emesis.  Denies abdominal pain, diarrhea, constipation, tenesmus, melena or hematochezia.  Patient recently treated for a UTI.  Daughter has brought in a urine sample taken just before visit.  Patient denies urinary urgency, frequency, hematuria or back pain.  Has not taken anything for nausea.   Past Medical History  Diagnosis Date  . Hypertension   . Colitis   . Coronary artery disease   . High cholesterol     takes Red Yeast Rice and Fish OIl daily  . Kidney stones   . PONV (postoperative nausea and vomiting)   . MI (myocardial infarction) 2010  . Shortness of breath     with exertion  . Pneumonia     hx of 20+yrs ago  . COPD (chronic obstructive pulmonary disease)     pleurisy or COPD exascerbation > 84yr ago  . Headache(784.0)   . Vertigo     HTN related bc gets up too fast  . Arthritis   . Bruises easily   . History of shingles 88yrs ago    in eye;occasionally gets some residual   . Accessory skin tags     arms/legs  . GERD (gastroesophageal reflux disease)   . Colitis   . Diverticulosis   . Urinary frequency   . Stress incontinence   . History of kidney stones   . Hypothyroidism     takes sYnthroid daily  . Cataract     early stage on right  . Macular degeneration, wet   .  Depression     some but takes Amitryptylline nightly  . Right-sided chest wall pain 08/28/12    PRESENTATION - RIGHT SIDED ANTERIOR CHEST PAIN  . Weight loss 08/28/12    10 LB WEIGHT LOSS OVER 3 MONTHS  . Lung mass 08/12/12    CHEST-XRAY/ PET -LOBULAR MASS LLL - 4.1 X 3.6 X 4.2 CM  . Dementia     mild  . S/P radiation therapy  10/05/2012-11/20/2012    Left Lower Lung and hilum / 70 Gy in 35 fractions  . Adenocarcinoma of lung 09/07/12    needle core bx-LLL-adenocarcinoma  . Hypertension     Current Outpatient Prescriptions on File Prior to Visit  Medication Sig Dispense Refill  . albuterol (PROVENTIL HFA;VENTOLIN HFA) 108 (90 BASE) MCG/ACT inhaler Inhale 2 puffs into the lungs every 6 (six) hours as needed for wheezing.  1 Inhaler  3  . amitriptyline (ELAVIL) 25 MG tablet Take 25 mg by mouth at bedtime.      Marland Kitchen aspirin 81 MG tablet Take 81 mg by mouth every evening.       . benzonatate (TESSALON) 100 MG capsule Take 1 capsule (100 mg total) by mouth 3 (three) times daily as needed for cough.  20 capsule  5  . carvedilol (COREG)  6.25 MG tablet Take 3.125 mg by mouth 2 (two) times daily with a meal.       . Cyanocobalamin (VITAMIN B-12 IJ) Inject 1,000 mcg/mL as directed every 30 (thirty) days.      . cycloSPORINE (RESTASIS) 0.05 % ophthalmic emulsion Place 1 drop into both eyes daily.      Marland Kitchen donepezil (ARICEPT) 10 MG tablet Take 10 mg by mouth at bedtime.      . Fluticasone Furoate-Vilanterol (BREO ELLIPTA) 100-25 MCG/INH AEPB Inhale 100 mcg into the lungs daily.  30 each  11  . levothyroxine (SYNTHROID) 125 MCG tablet Take 1 tablet (125 mcg total) by mouth daily before breakfast.  30 tablet  2  . mesalamine (LIALDA) 1.2 G EC tablet Take 1,200 mg by mouth daily with breakfast.       . nitroGLYCERIN (NITROSTAT) 0.4 MG SL tablet Place 0.4 mg under the tongue every 5 (five) minutes as needed for chest pain.       . pantoprazole (PROTONIX) 40 MG tablet Take 40 mg by mouth every morning.       Marland Kitchen  ibuprofen (ADVIL,MOTRIN) 800 MG tablet Take 800 mg by mouth every 8 (eight) hours as needed for pain.      . naproxen sodium (ANAPROX) 220 MG tablet Take 220 mg by mouth 2 (two) times daily as needed (pain).       No current facility-administered medications on file prior to visit.    Allergies  Allergen Reactions  . Codeine Hives    Family History  Problem Relation Age of Onset  . Anesthesia problems Mother   . Hypotension Neg Hx   . Malignant hyperthermia Neg Hx   . Pseudochol deficiency Neg Hx   . Cancer Maternal Aunt     breast    History   Social History  . Marital Status: Widowed    Spouse Name: N/A    Number of Children: 4  . Years of Education: N/A   Occupational History  . retired     Audiological scientist   Social History Main Topics  . Smoking status: Former Smoker -- 1.00 packs/day for 40 years    Types: Cigarettes    Quit date: 08/26/2002  . Smokeless tobacco: Never Used     Comment: quit several yrs ago  . Alcohol Use: Yes     Comment: 2 times week alcohol  . Drug Use: No  . Sexual Activity: No   Other Topics Concern  . None   Social History Narrative   Daughter is nurse in neuro ICU    Review of Systems - See HPI.  All other ROS are negative.   Filed Vitals:   06/07/13 1308  BP: 108/68  Pulse: 70  Temp: 97.7 F (36.5 C)   Physical Exam  Vitals reviewed. Constitutional: She is oriented to person, place, and time and well-developed, well-nourished, and in no distress.  HENT:  Head: Normocephalic and atraumatic.  Right Ear: External ear normal.  Left Ear: External ear normal.  Nose: Nose normal.  Mouth/Throat: Oropharynx is clear and moist. No oropharyngeal exudate.  TM within normal limits bilaterally.  Eyes: Conjunctivae are normal. Pupils are equal, round, and reactive to light.  Neck: Neck supple.  Cardiovascular: Normal rate, regular rhythm, normal heart sounds and intact distal pulses.   Pulmonary/Chest: Effort normal and breath sounds  normal. No respiratory distress. She has no wheezes. She has no rales. She exhibits no tenderness.  Abdominal: Soft. Bowel sounds are normal. She exhibits  no distension and no mass. There is no tenderness. There is no rebound and no guarding.  Lymphadenopathy:    She has no cervical adenopathy.  Neurological: She is oriented to person, place, and time. No cranial nerve deficit.  Skin: Skin is warm and dry. No rash noted.  Psychiatric: Affect normal.     Recent Results (from the past 2160 hour(s))  CBC     Status: Abnormal   Collection Time    04/24/13 10:29 AM      Result Value Range   WBC 8.8  4.0 - 10.5 K/uL   RBC 4.77  3.87 - 5.11 MIL/uL   Hemoglobin 14.1  12.0 - 15.0 g/dL   HCT 16.1  09.6 - 04.5 %   MCV 87.4  78.0 - 100.0 fL   MCH 29.6  26.0 - 34.0 pg   MCHC 33.8  30.0 - 36.0 g/dL   RDW 40.9 (*) 81.1 - 91.4 %   Platelets 233  150 - 400 K/uL  BASIC METABOLIC PANEL     Status: Abnormal   Collection Time    04/24/13 10:29 AM      Result Value Range   Sodium 132 (*) 135 - 145 mEq/L   Potassium 3.9  3.5 - 5.1 mEq/L   Chloride 96  96 - 112 mEq/L   CO2 24  19 - 32 mEq/L   Glucose, Bld 124 (*) 70 - 99 mg/dL   BUN 17  6 - 23 mg/dL   Creatinine, Ser 7.82  0.50 - 1.10 mg/dL   Calcium 95.6  8.4 - 21.3 mg/dL   GFR calc non Af Amer 49 (*) >90 mL/min   GFR calc Af Amer 57 (*) >90 mL/min   Comment: (NOTE)     The eGFR has been calculated using the CKD EPI equation.     This calculation has not been validated in all clinical situations.     eGFR's persistently <90 mL/min signify possible Chronic Kidney     Disease.  PRO B NATRIURETIC PEPTIDE     Status: None   Collection Time    04/24/13 10:29 AM      Result Value Range   Pro B Natriuretic peptide (BNP) 435.7  0 - 450 pg/mL  TROPONIN I     Status: None   Collection Time    04/24/13 10:29 AM      Result Value Range   Troponin I <0.30  <0.30 ng/mL   Comment:            Due to the release kinetics of cTnI,     a negative result  within the first hours     of the onset of symptoms does not rule out     myocardial infarction with certainty.     If myocardial infarction is still suspected,     repeat the test at appropriate intervals.  POCT I-STAT TROPONIN I     Status: None   Collection Time    04/24/13 10:42 AM      Result Value Range   Troponin i, poc 0.02  0.00 - 0.08 ng/mL   Comment 3            Comment: Due to the release kinetics of cTnI,     a negative result within the first hours     of the onset of symptoms does not rule out     myocardial infarction with certainty.     If myocardial infarction is still suspected,  repeat the test at appropriate intervals.  MRSA PCR SCREENING     Status: None   Collection Time    04/24/13  3:38 PM      Result Value Range   MRSA by PCR NEGATIVE  NEGATIVE   Comment:            The GeneXpert MRSA Assay (FDA     approved for NASAL specimens     only), is one component of a     comprehensive MRSA colonization     surveillance program. It is not     intended to diagnose MRSA     infection nor to guide or     monitor treatment for     MRSA infections.  CBC     Status: None   Collection Time    04/25/13  3:45 AM      Result Value Range   WBC 7.3  4.0 - 10.5 K/uL   RBC 4.21  3.87 - 5.11 MIL/uL   Hemoglobin 12.1  12.0 - 15.0 g/dL   HCT 19.1  47.8 - 29.5 %   MCV 86.2  78.0 - 100.0 fL   MCH 28.7  26.0 - 34.0 pg   MCHC 33.3  30.0 - 36.0 g/dL   RDW 62.1  30.8 - 65.7 %   Platelets 201  150 - 400 K/uL  BASIC METABOLIC PANEL     Status: Abnormal   Collection Time    04/25/13  3:45 AM      Result Value Range   Sodium 131 (*) 135 - 145 mEq/L   Potassium 4.3  3.5 - 5.1 mEq/L   Chloride 99  96 - 112 mEq/L   CO2 24  19 - 32 mEq/L   Glucose, Bld 197 (*) 70 - 99 mg/dL   BUN 22  6 - 23 mg/dL   Creatinine, Ser 8.46  0.50 - 1.10 mg/dL   Calcium 96.2  8.4 - 95.2 mg/dL   GFR calc non Af Amer 51 (*) >90 mL/min   GFR calc Af Amer 59 (*) >90 mL/min   Comment: (NOTE)      The eGFR has been calculated using the CKD EPI equation.     This calculation has not been validated in all clinical situations.     eGFR's persistently <90 mL/min signify possible Chronic Kidney     Disease.  BASIC METABOLIC PANEL     Status: Abnormal   Collection Time    04/26/13  5:40 AM      Result Value Range   Sodium 137  135 - 145 mEq/L   Potassium 4.3  3.5 - 5.1 mEq/L   Chloride 105  96 - 112 mEq/L   CO2 23  19 - 32 mEq/L   Glucose, Bld 145 (*) 70 - 99 mg/dL   BUN 25 (*) 6 - 23 mg/dL   Creatinine, Ser 8.41  0.50 - 1.10 mg/dL   Calcium 9.3  8.4 - 32.4 mg/dL   GFR calc non Af Amer 60 (*) >90 mL/min   GFR calc Af Amer 70 (*) >90 mL/min   Comment: (NOTE)     The eGFR has been calculated using the CKD EPI equation.     This calculation has not been validated in all clinical situations.     eGFR's persistently <90 mL/min signify possible Chronic Kidney     Disease.  CBC     Status: Abnormal   Collection Time    04/26/13  5:40 AM  Result Value Range   WBC 14.7 (*) 4.0 - 10.5 K/uL   RBC 3.89  3.87 - 5.11 MIL/uL   Hemoglobin 11.5 (*) 12.0 - 15.0 g/dL   HCT 16.1 (*) 09.6 - 04.5 %   MCV 86.4  78.0 - 100.0 fL   MCH 29.6  26.0 - 34.0 pg   MCHC 34.2  30.0 - 36.0 g/dL   RDW 40.9  81.1 - 91.4 %   Platelets 235  150 - 400 K/uL  CBC WITH DIFFERENTIAL     Status: Abnormal   Collection Time    05/10/13  5:07 PM      Result Value Range   WBC 10.3  4.0 - 10.5 K/uL   RBC 4.51  3.87 - 5.11 MIL/uL   Hemoglobin 13.4  12.0 - 15.0 g/dL   HCT 78.2  95.6 - 21.3 %   MCV 86.9  78.0 - 100.0 fL   MCH 29.7  26.0 - 34.0 pg   MCHC 34.2  30.0 - 36.0 g/dL   RDW 08.6 (*) 57.8 - 46.9 %   Platelets 262  150 - 400 K/uL   Neutrophils Relative % 81 (*) 43 - 77 %   Neutro Abs 8.4 (*) 1.7 - 7.7 K/uL   Lymphocytes Relative 10 (*) 12 - 46 %   Lymphs Abs 1.0  0.7 - 4.0 K/uL   Monocytes Relative 7  3 - 12 %   Monocytes Absolute 0.8  0.1 - 1.0 K/uL   Eosinophils Relative 1  0 - 5 %   Eosinophils  Absolute 0.1  0.0 - 0.7 K/uL   Basophils Relative 1  0 - 1 %   Basophils Absolute 0.1  0.0 - 0.1 K/uL   Smear Review       Comment: Mild left shift (1-5% metas, occ myelo, occ bands)     Elliptocytes     Platelets are unremarkable.  COMPREHENSIVE METABOLIC PANEL     Status: Abnormal   Collection Time    05/10/13  5:07 PM      Result Value Range   Sodium 137  135 - 145 mEq/L   Potassium 3.9  3.5 - 5.3 mEq/L   Chloride 100  96 - 112 mEq/L   CO2 24  19 - 32 mEq/L   Glucose, Bld 85  70 - 99 mg/dL   BUN 22  6 - 23 mg/dL   Creat 6.29  5.28 - 4.13 mg/dL   Total Bilirubin 0.8  0.3 - 1.2 mg/dL   Alkaline Phosphatase 93  39 - 117 U/L   AST 13  0 - 37 U/L   ALT 24  0 - 35 U/L   Total Protein 5.5 (*) 6.0 - 8.3 g/dL   Albumin 2.9 (*) 3.5 - 5.2 g/dL   Calcium 8.8  8.4 - 24.4 mg/dL  PRO B NATRIURETIC PEPTIDE     Status: None   Collection Time    05/10/13  5:07 PM      Result Value Range   Pro B Natriuretic peptide (BNP) 267.10  <451 pg/mL  TSH     Status: Abnormal   Collection Time    05/10/13  5:07 PM      Result Value Range   TSH 6.559 (*) 0.350 - 4.500 uIU/mL  URINALYSIS, ROUTINE W REFLEX MICROSCOPIC     Status: Abnormal   Collection Time    05/12/13  2:11 AM      Result Value Range   Color, Urine YELLOW  YELLOW   APPearance CLOUDY (*) CLEAR   Specific Gravity, Urine 1.022  1.005 - 1.030   pH 6.0  5.0 - 8.0   Glucose, UA NEGATIVE  NEGATIVE mg/dL   Hgb urine dipstick TRACE (*) NEGATIVE   Bilirubin Urine NEGATIVE  NEGATIVE   Ketones, ur NEGATIVE  NEGATIVE mg/dL   Protein, ur NEGATIVE  NEGATIVE mg/dL   Urobilinogen, UA 0.2  0.0 - 1.0 mg/dL   Nitrite NEGATIVE  NEGATIVE   Leukocytes, UA MODERATE (*) NEGATIVE  URINE MICROSCOPIC-ADD ON     Status: Abnormal   Collection Time    05/12/13  2:11 AM      Result Value Range   Squamous Epithelial / LPF RARE  RARE   WBC, UA 11-20  <3 WBC/hpf   RBC / HPF 0-2  <3 RBC/hpf   Bacteria, UA MANY (*) RARE  URINE CULTURE     Status: None    Collection Time    05/12/13  2:11 AM      Result Value Range   Specimen Description URINE, RANDOM     Special Requests NONE     Culture  Setup Time       Value: 05/12/2013 15:31     Performed at [facility]   Culture       Value: >=100,000 COLONIES/mL ESCHERICHIA COLI     Performed at [facility]   Report Status 05/14/2013 FINAL     Organism ID, Bacteria ESCHERICHIA COLI    COMPREHENSIVE METABOLIC PANEL     Status: Abnormal   Collection Time    06/07/13  2:08 PM      Result Value Range   Sodium 135  135 - 145 mEq/L   Potassium 4.4  3.5 - 5.3 mEq/L   Chloride 101  96 - 112 mEq/L   CO2 26  19 - 32 mEq/L   Glucose, Bld 84  70 - 99 mg/dL   BUN 14  6 - 23 mg/dL   Creat 9.60  4.54 - 0.98 mg/dL   Total Bilirubin 0.5  0.3 - 1.2 mg/dL   Alkaline Phosphatase 146 (*) 39 - 117 U/L   AST 18  0 - 37 U/L   ALT 13  0 - 35 U/L   Total Protein 6.2  6.0 - 8.3 g/dL   Albumin 3.5  3.5 - 5.2 g/dL   Calcium 11.9  8.4 - 14.7 mg/dL  CBC WITH DIFFERENTIAL     Status: Abnormal   Collection Time    06/07/13  2:08 PM      Result Value Range   WBC 8.0  4.0 - 10.5 K/uL   RBC 4.27  3.87 - 5.11 MIL/uL   Hemoglobin 12.5  12.0 - 15.0 g/dL   HCT 82.9  56.2 - 13.0 %   MCV 85.0  78.0 - 100.0 fL   MCH 29.3  26.0 - 34.0 pg   MCHC 34.4  30.0 - 36.0 g/dL   RDW 86.5 (*) 78.4 - 69.6 %   Platelets 430 (*) 150 - 400 K/uL   Neutrophils Relative % 71  43 - 77 %   Neutro Abs 5.8  1.7 - 7.7 K/uL   Lymphocytes Relative 14  12 - 46 %   Lymphs Abs 1.1  0.7 - 4.0 K/uL   Monocytes Relative 11  3 - 12 %   Monocytes Absolute 0.9  0.1 - 1.0 K/uL   Eosinophils Relative 2  0 - 5 %   Eosinophils  Absolute 0.2  0.0 - 0.7 K/uL   Basophils Relative 2 (*) 0 - 1 %   Basophils Absolute 0.1  0.0 - 0.1 K/uL   Smear Review Criteria for review not met    POCT URINALYSIS DIPSTICK     Status: None   Collection Time    06/07/13  2:35 PM      Result Value Range   Color, UA yellow     Clarity, UA clear      Glucose, UA neg     Bilirubin, UA neg     Ketones, UA neg     Spec Grav, UA 1.010     Blood, UA neg     pH, UA 5.0     Protein, UA neg     Urobilinogen, UA negative     Nitrite, UA neg     Leukocytes, UA Trace    CULTURE, URINE COMPREHENSIVE     Status: None   Collection Time    06/07/13  2:37 PM      Result Value Range   Culture ESCHERICHIA COLI     Colony Count >=100,000 COLONIES/ML     Organism ID, Bacteria ESCHERICHIA COLI    BUN AND CREATININE (CC13)     Status: None   Collection Time    06/10/13  4:03 PM      Result Value Range   BUN 13.5  7.0 - 26.0 mg/dL   Creatinine 1.0  0.6 - 1.1 mg/dL    Assessment/Plan: UTI (urinary tract infection) Rx Augmentin.  Increase fluid intake.  Rest.  Probiotic. Cranberry supplement. Send for culture.  Nausea alone Rx Zofran.  Ginger capsules. Nausea possibly combination of UTI and Anxiety.  Will obtain CBC and CMP  Anxiety Rx Lexapro 5 mg.  Continue to monitor BP.  May take amlodipine as previously prescribed if BP above 140/90.  Return to clinic in 1-2 weeks.

## 2013-06-15 NOTE — Assessment & Plan Note (Signed)
Rx Augmentin.  Increase fluid intake.  Rest.  Probiotic. Cranberry supplement. Send for culture.

## 2013-06-15 NOTE — Assessment & Plan Note (Addendum)
Rx Zofran.  Ginger capsules. Nausea possibly combination of UTI and Anxiety.  Will obtain CBC and CMP

## 2013-06-15 NOTE — Assessment & Plan Note (Signed)
Rx Lexapro 5 mg.  Continue to monitor BP.  May take amlodipine as previously prescribed if BP above 140/90.  Return to clinic in 1-2 weeks.

## 2013-06-15 NOTE — Telephone Encounter (Signed)
Notified pt and she voices understanding. 

## 2013-06-16 ENCOUNTER — Other Ambulatory Visit: Payer: Medicare Other

## 2013-06-16 ENCOUNTER — Other Ambulatory Visit: Payer: Self-pay | Admitting: Physician Assistant

## 2013-06-16 ENCOUNTER — Telehealth: Payer: Self-pay | Admitting: Emergency Medicine

## 2013-06-16 DIAGNOSIS — R197 Diarrhea, unspecified: Secondary | ICD-10-CM

## 2013-06-16 NOTE — Telephone Encounter (Signed)
[email or web address] x1 

## 2013-06-16 NOTE — Telephone Encounter (Signed)
Pt returning call & can be reached at [phone].  Jodi Frazier

## 2013-06-16 NOTE — Telephone Encounter (Signed)
[email or web address] x1 

## 2013-06-16 NOTE — Telephone Encounter (Signed)
Pt called in to speak w/ Meghan regarding the same.  Jodi Frazier

## 2013-06-17 NOTE — Telephone Encounter (Signed)
I called the pt daughter and she states she was wanting to speak with Meghan. I advised that she is with patients at this time, but I would be happy with sedning her a message about what it was regarding. She states no thanks and hangs up. I will sign off on message. Carron Curie, CMA

## 2013-06-18 ENCOUNTER — Telehealth: Payer: Self-pay | Admitting: Physician Assistant

## 2013-06-18 ENCOUNTER — Other Ambulatory Visit: Payer: Self-pay | Admitting: Physician Assistant

## 2013-06-18 DIAGNOSIS — N390 Urinary tract infection, site not specified: Secondary | ICD-10-CM

## 2013-06-18 MED ORDER — CEFUROXIME AXETIL 250 MG PO TABS: 250.0000 mg | ORAL_TABLET | Freq: Two times a day (BID) | ORAL | Status: DC

## 2013-06-18 NOTE — Telephone Encounter (Signed)
DAUGHTER LOU EARNHARDT WANTS TO PICK UP A NEW ANTIBIOTIC FOR HER MOTHER TODAY [facility].  MOM DOES NOT TOLERATE AMOXICILLAN WELL AND HAS DIARRHEA SINCE STARTING THE MED.  DAUGHTER'S NUMBER IS [phone]

## 2013-06-18 NOTE — Telephone Encounter (Signed)
This has been taken care of by provider/SLS

## 2013-06-18 NOTE — Telephone Encounter (Signed)
Patient daughter called in stating that the amoxicillin is giving patient diarrhea and an upset stomach. She would like to know if something else could be called in to [facility] pharmacy?

## 2013-06-21 ENCOUNTER — Encounter (HOSPITAL_COMMUNITY)
Admission: RE | Admit: 2013-06-21 | Discharge: 2013-06-21 | Disposition: A | Discharge status: Home / Self Care | Payer: Medicare Other | Source: Ambulatory Visit | Attending: Radiation Oncology | Admitting: Radiation Oncology

## 2013-06-21 ENCOUNTER — Telehealth: Payer: Self-pay | Admitting: Radiation Oncology

## 2013-06-21 DIAGNOSIS — R110 Nausea: Secondary | ICD-10-CM

## 2013-06-21 DIAGNOSIS — R091 Pleurisy: Secondary | ICD-10-CM | POA: Insufficient documentation

## 2013-06-21 DIAGNOSIS — R911 Solitary pulmonary nodule: Secondary | ICD-10-CM | POA: Insufficient documentation

## 2013-06-21 DIAGNOSIS — C3490 Malignant neoplasm of unspecified part of unspecified bronchus or lung: Secondary | ICD-10-CM

## 2013-06-21 LAB — GLUCOSE, CAPILLARY: Glucose-Capillary: 91 mg/dL (ref 70–99)

## 2013-06-21 MED ORDER — FLUDEOXYGLUCOSE F - 18 (FDG) INJECTION
17.6000 | Freq: Once | INTRAVENOUS | Status: AC | PRN
  Administered 2013-06-21: 17.6 via INTRAVENOUS

## 2013-06-21 NOTE — Telephone Encounter (Signed)
Phoned Truddie Hidden. Explained findings of PET per Dr. Kathrynn Running. Expressed that [place] of [facility]SIM will be in contact today to arrange simulation at the patient's convenience. She verbalized understanding of all reviewed and expressed this "is what we expected." Expressed appreciation for the call. Encouraged to call with future needs.

## 2013-06-21 NOTE — Telephone Encounter (Signed)
Message copied by Agnes Lawrence on Mon Jun 21, 2013  1:35 PM ------      Message from: [place], [place]      Created: Mon Jun 21, 2013 12:33 PM       I reviewed this PET scan. The patient does have increased hypermetabolism in the right lung nodule consistent with malignancy. Whether this represents a solitary oligo metastasis or a new primary stage I, it appears to be amenable to stereotactic body radiotherapy with curative intent. Her left side of the chest shows hypermetabolism associated with an area that has previously been clinically suggestive of radiation pneumonitis. The left side continues to show hypermetabolism which is consistent with radiation pneumonitis verses tumor recurrence. At this point, I would give the patient the benefit of the doubt and treat the new right-sided lung nodule definitively, while following the left-sided changes radiographically.  Accordingly, I think it would be reasonable to proceed with scheduling CT simulation for SB RT to the right lung nodule at the patient's convenience. I will look forward to reviewing the PET findings and discussing the rationale for various Treatment/diagnostic      options with her at that time ------

## 2013-06-26 ENCOUNTER — Other Ambulatory Visit: Payer: Self-pay

## 2013-06-26 ENCOUNTER — Emergency Department (HOSPITAL_COMMUNITY): Payer: Medicare Other

## 2013-06-26 ENCOUNTER — Encounter (HOSPITAL_COMMUNITY): Payer: Self-pay | Admitting: Emergency Medicine

## 2013-06-26 ENCOUNTER — Inpatient Hospital Stay (HOSPITAL_COMMUNITY)
Admission: EM | Admit: 2013-06-26 | Discharge: 2013-07-02 | DRG: 305 | Disposition: A | Discharge status: Skilled Nursing Facility | Payer: Medicare Other | Attending: Internal Medicine | Admitting: Internal Medicine

## 2013-06-26 DIAGNOSIS — Z87891 Personal history of nicotine dependence: Secondary | ICD-10-CM

## 2013-06-26 DIAGNOSIS — F0390 Unspecified dementia without behavioral disturbance: Secondary | ICD-10-CM | POA: Diagnosis present

## 2013-06-26 DIAGNOSIS — F3289 Other specified depressive episodes: Secondary | ICD-10-CM | POA: Diagnosis present

## 2013-06-26 DIAGNOSIS — Z885 Allergy status to narcotic agent status: Secondary | ICD-10-CM

## 2013-06-26 DIAGNOSIS — C3432 Malignant neoplasm of lower lobe, left bronchus or lung: Secondary | ICD-10-CM | POA: Diagnosis present

## 2013-06-26 DIAGNOSIS — I252 Old myocardial infarction: Secondary | ICD-10-CM

## 2013-06-26 DIAGNOSIS — Z923 Personal history of irradiation: Secondary | ICD-10-CM

## 2013-06-26 DIAGNOSIS — R05 Cough: Secondary | ICD-10-CM

## 2013-06-26 DIAGNOSIS — R110 Nausea: Secondary | ICD-10-CM | POA: Diagnosis present

## 2013-06-26 DIAGNOSIS — R0789 Other chest pain: Secondary | ICD-10-CM | POA: Diagnosis present

## 2013-06-26 DIAGNOSIS — Z79899 Other long term (current) drug therapy: Secondary | ICD-10-CM

## 2013-06-26 DIAGNOSIS — M129 Arthropathy, unspecified: Secondary | ICD-10-CM | POA: Diagnosis present

## 2013-06-26 DIAGNOSIS — I160 Hypertensive urgency: Secondary | ICD-10-CM

## 2013-06-26 DIAGNOSIS — Z880 Allergy status to penicillin: Secondary | ICD-10-CM

## 2013-06-26 DIAGNOSIS — Z515 Encounter for palliative care: Secondary | ICD-10-CM

## 2013-06-26 DIAGNOSIS — Z87442 Personal history of urinary calculi: Secondary | ICD-10-CM

## 2013-06-26 DIAGNOSIS — E876 Hypokalemia: Secondary | ICD-10-CM | POA: Diagnosis present

## 2013-06-26 DIAGNOSIS — K219 Gastro-esophageal reflux disease without esophagitis: Secondary | ICD-10-CM | POA: Diagnosis present

## 2013-06-26 DIAGNOSIS — Z8619 Personal history of other infectious and parasitic diseases: Secondary | ICD-10-CM

## 2013-06-26 DIAGNOSIS — J700 Acute pulmonary manifestations due to radiation: Secondary | ICD-10-CM | POA: Diagnosis present

## 2013-06-26 DIAGNOSIS — I2510 Atherosclerotic heart disease of native coronary artery without angina pectoris: Secondary | ICD-10-CM | POA: Diagnosis present

## 2013-06-26 DIAGNOSIS — Z7689 Persons encountering health services in other specified circumstances: Secondary | ICD-10-CM

## 2013-06-26 DIAGNOSIS — J4489 Other specified chronic obstructive pulmonary disease: Secondary | ICD-10-CM | POA: Diagnosis present

## 2013-06-26 DIAGNOSIS — N390 Urinary tract infection, site not specified: Secondary | ICD-10-CM

## 2013-06-26 DIAGNOSIS — E7800 Pure hypercholesterolemia, unspecified: Secondary | ICD-10-CM | POA: Diagnosis present

## 2013-06-26 DIAGNOSIS — J910 Malignant pleural effusion: Secondary | ICD-10-CM | POA: Diagnosis present

## 2013-06-26 DIAGNOSIS — F411 Generalized anxiety disorder: Secondary | ICD-10-CM | POA: Diagnosis present

## 2013-06-26 DIAGNOSIS — Z7982 Long term (current) use of aspirin: Secondary | ICD-10-CM

## 2013-06-26 DIAGNOSIS — Z96659 Presence of unspecified artificial knee joint: Secondary | ICD-10-CM

## 2013-06-26 DIAGNOSIS — Z87440 Personal history of urinary (tract) infections: Secondary | ICD-10-CM

## 2013-06-26 DIAGNOSIS — I10 Essential (primary) hypertension: Principal | ICD-10-CM | POA: Diagnosis present

## 2013-06-26 DIAGNOSIS — I951 Orthostatic hypotension: Secondary | ICD-10-CM | POA: Diagnosis present

## 2013-06-26 DIAGNOSIS — C3430 Malignant neoplasm of lower lobe, unspecified bronchus or lung: Secondary | ICD-10-CM | POA: Diagnosis present

## 2013-06-26 DIAGNOSIS — E871 Hypo-osmolality and hyponatremia: Secondary | ICD-10-CM | POA: Diagnosis present

## 2013-06-26 DIAGNOSIS — K259 Gastric ulcer, unspecified as acute or chronic, without hemorrhage or perforation: Secondary | ICD-10-CM | POA: Diagnosis present

## 2013-06-26 DIAGNOSIS — J449 Chronic obstructive pulmonary disease, unspecified: Secondary | ICD-10-CM | POA: Diagnosis present

## 2013-06-26 DIAGNOSIS — E039 Hypothyroidism, unspecified: Secondary | ICD-10-CM | POA: Diagnosis present

## 2013-06-26 DIAGNOSIS — R6889 Other general symptoms and signs: Secondary | ICD-10-CM | POA: Diagnosis present

## 2013-06-26 DIAGNOSIS — R32 Unspecified urinary incontinence: Secondary | ICD-10-CM | POA: Diagnosis present

## 2013-06-26 DIAGNOSIS — T66XXXS Radiation sickness, unspecified, sequela: Secondary | ICD-10-CM

## 2013-06-26 DIAGNOSIS — C3490 Malignant neoplasm of unspecified part of unspecified bronchus or lung: Secondary | ICD-10-CM

## 2013-06-26 DIAGNOSIS — R55 Syncope and collapse: Secondary | ICD-10-CM

## 2013-06-26 DIAGNOSIS — Z9861 Coronary angioplasty status: Secondary | ICD-10-CM

## 2013-06-26 DIAGNOSIS — Z791 Long term (current) use of non-steroidal anti-inflammatories (NSAID): Secondary | ICD-10-CM

## 2013-06-26 DIAGNOSIS — F419 Anxiety disorder, unspecified: Secondary | ICD-10-CM

## 2013-06-26 DIAGNOSIS — C3492 Malignant neoplasm of unspecified part of left bronchus or lung: Secondary | ICD-10-CM

## 2013-06-26 DIAGNOSIS — F329 Major depressive disorder, single episode, unspecified: Secondary | ICD-10-CM

## 2013-06-26 DIAGNOSIS — Z66 Do not resuscitate: Secondary | ICD-10-CM | POA: Diagnosis not present

## 2013-06-26 DIAGNOSIS — H353290 Exudative age-related macular degeneration, unspecified eye, stage unspecified: Secondary | ICD-10-CM | POA: Diagnosis present

## 2013-06-26 DIAGNOSIS — R079 Chest pain, unspecified: Secondary | ICD-10-CM

## 2013-06-26 DIAGNOSIS — Y842 Radiological procedure and radiotherapy as the cause of abnormal reaction of the patient, or of later complication, without mention of misadventure at the time of the procedure: Secondary | ICD-10-CM | POA: Diagnosis present

## 2013-06-26 DIAGNOSIS — G893 Neoplasm related pain (acute) (chronic): Secondary | ICD-10-CM | POA: Diagnosis present

## 2013-06-26 LAB — POCT I-STAT TROPONIN I

## 2013-06-26 LAB — TROPONIN I
Troponin I: <0.3 ng/mL (ref <0.30)
Troponin I: <0.3 ng/mL (ref <0.30)

## 2013-06-26 LAB — CBC
Hemoglobin: 14.1 g/dL (ref 12.0–15.0)
MCV: 89.3 fL (ref 78.0–100.0)
Platelets: 452 10*3/uL — ABNORMAL HIGH (ref 150–400)
RBC: 4.76 MIL/uL (ref 3.87–5.11)
WBC: 8.4 10*3/uL (ref 4.0–10.5)

## 2013-06-26 LAB — BASIC METABOLIC PANEL
Calcium: 10.6 mg/dL — ABNORMAL HIGH (ref 8.4–10.5)
GFR calc Af Amer: >90 mL/min (ref >90)
GFR calc non Af Amer: 79 mL/min — ABNORMAL LOW (ref >90)
Potassium: 4.3 mEq/L (ref 3.5–5.1)
Sodium: 136 mEq/L (ref 135–145)

## 2013-06-26 LAB — PRO B NATRIURETIC PEPTIDE: Pro B Natriuretic peptide (BNP): 155.8 pg/mL (ref 0–450)

## 2013-06-26 MED ORDER — CEFUROXIME AXETIL 250 MG PO TABS
250.0000 mg | ORAL_TABLET | Freq: Two times a day (BID) | ORAL | Status: DC
  Administered 2013-06-27 – 2013-07-02 (×11): 250 mg via ORAL
  Filled 2013-06-26 (×14): qty 1

## 2013-06-26 MED ORDER — ASPIRIN 81 MG PO CHEW
81.0000 mg | CHEWABLE_TABLET | Freq: Every evening | ORAL | Status: DC
  Administered 2013-06-26 – 2013-07-01 (×6): 81 mg via ORAL
  Filled 2013-06-26 (×8): qty 1

## 2013-06-26 MED ORDER — CARVEDILOL 6.25 MG PO TABS
6.2500 mg | ORAL_TABLET | Freq: Two times a day (BID) | ORAL | Status: DC
  Administered 2013-06-27 – 2013-07-02 (×11): 6.25 mg via ORAL
  Filled 2013-06-26 (×14): qty 1

## 2013-06-26 MED ORDER — NAPROXEN SODIUM 275 MG PO TABS
275.0000 mg | ORAL_TABLET | Freq: Two times a day (BID) | ORAL | Status: DC | PRN
  Administered 2013-06-27: 275 mg via ORAL
  Filled 2013-06-26: qty 1

## 2013-06-26 MED ORDER — PANTOPRAZOLE SODIUM 40 MG PO TBEC
40.0000 mg | DELAYED_RELEASE_TABLET | Freq: Every morning | ORAL | Status: DC
  Administered 2013-06-27 – 2013-07-02 (×6): 40 mg via ORAL
  Filled 2013-06-26 (×8): qty 1

## 2013-06-26 MED ORDER — HYDRALAZINE HCL 20 MG/ML IJ SOLN
10.0000 mg | INTRAMUSCULAR | Status: DC | PRN
  Administered 2013-07-01: 10 mg via INTRAVENOUS
  Filled 2013-06-26: qty 0.5
  Filled 2013-06-26: qty 1

## 2013-06-26 MED ORDER — HYDRALAZINE HCL 20 MG/ML IJ SOLN
10.0000 mg | Freq: Once | INTRAMUSCULAR | Status: AC
  Administered 2013-06-26: 10 mg via INTRAVENOUS
  Filled 2013-06-26: qty 0.5

## 2013-06-26 MED ORDER — CYCLOSPORINE 0.05 % OP EMUL
1.0000 [drp] | Freq: Two times a day (BID) | OPHTHALMIC | Status: DC
  Administered 2013-06-26 – 2013-07-02 (×12): 1 [drp] via OPHTHALMIC
  Filled 2013-06-26 (×14): qty 1

## 2013-06-26 MED ORDER — ASPIRIN 325 MG PO TABS
325.0000 mg | ORAL_TABLET | Freq: Once | ORAL | Status: AC
  Administered 2013-06-26: 325 mg via ORAL
  Filled 2013-06-26: qty 1

## 2013-06-26 MED ORDER — AMLODIPINE BESYLATE 2.5 MG PO TABS
2.5000 mg | ORAL_TABLET | Freq: Every day | ORAL | Status: DC
  Administered 2013-06-27 – 2013-06-28 (×2): 2.5 mg via ORAL
  Filled 2013-06-26 (×3): qty 1

## 2013-06-26 MED ORDER — HYDROCHLOROTHIAZIDE 12.5 MG PO CAPS
12.5000 mg | ORAL_CAPSULE | Freq: Every day | ORAL | Status: DC
  Administered 2013-06-26 – 2013-06-28 (×3): 12.5 mg via ORAL
  Filled 2013-06-26 (×5): qty 1

## 2013-06-26 MED ORDER — ONDANSETRON HCL 4 MG/2ML IJ SOLN
4.0000 mg | Freq: Three times a day (TID) | INTRAMUSCULAR | Status: AC | PRN
  Administered 2013-06-27: 4 mg via INTRAVENOUS
  Filled 2013-06-26: qty 2

## 2013-06-26 MED ORDER — ESCITALOPRAM OXALATE 5 MG PO TABS
5.0000 mg | ORAL_TABLET | Freq: Every day | ORAL | Status: DC
  Administered 2013-06-26 – 2013-06-27 (×2): 5 mg via ORAL
  Filled 2013-06-26 (×3): qty 1

## 2013-06-26 MED ORDER — ONDANSETRON HCL 4 MG PO TABS
4.0000 mg | ORAL_TABLET | Freq: Three times a day (TID) | ORAL | Status: DC | PRN
  Administered 2013-06-27 – 2013-06-28 (×2): 4 mg via ORAL
  Filled 2013-06-26 (×2): qty 1

## 2013-06-26 MED ORDER — LEVOTHYROXINE SODIUM 125 MCG PO TABS
125.0000 ug | ORAL_TABLET | Freq: Every day | ORAL | Status: DC
  Administered 2013-06-27 – 2013-06-28 (×2): 125 ug via ORAL
  Filled 2013-06-26 (×4): qty 1

## 2013-06-26 MED ORDER — AMITRIPTYLINE HCL 25 MG PO TABS
25.0000 mg | ORAL_TABLET | Freq: Every day | ORAL | Status: DC
  Administered 2013-06-26 – 2013-07-01 (×6): 25 mg via ORAL
  Filled 2013-06-26 (×9): qty 1

## 2013-06-26 MED ORDER — HYDROCODONE-ACETAMINOPHEN 5-325 MG PO TABS
1.0000 | ORAL_TABLET | Freq: Four times a day (QID) | ORAL | Status: DC | PRN
  Administered 2013-06-26 – 2013-06-27 (×2): 1 via ORAL
  Administered 2013-06-28 – 2013-06-29 (×2): 2 via ORAL
  Administered 2013-06-30: 1 via ORAL
  Filled 2013-06-26: qty 2
  Filled 2013-06-26 (×2): qty 1
  Filled 2013-06-26: qty 2
  Filled 2013-06-26 (×2): qty 1

## 2013-06-26 MED ORDER — HEPARIN SODIUM (PORCINE) 5000 UNIT/ML IJ SOLN
5000.0000 [IU] | Freq: Three times a day (TID) | INTRAMUSCULAR | Status: DC
  Administered 2013-06-26 – 2013-07-02 (×17): 5000 [IU] via SUBCUTANEOUS
  Filled 2013-06-26 (×22): qty 1

## 2013-06-26 MED ORDER — DONEPEZIL HCL 5 MG PO TABS
15.0000 mg | ORAL_TABLET | Freq: Every day | ORAL | Status: DC
  Administered 2013-06-26 – 2013-07-01 (×6): 15 mg via ORAL
  Filled 2013-06-26 (×8): qty 1

## 2013-06-26 MED ORDER — SODIUM CHLORIDE 0.9 % IJ SOLN
3.0000 mL | Freq: Two times a day (BID) | INTRAMUSCULAR | Status: DC
  Administered 2013-06-26 – 2013-06-29 (×4): 3 mL via INTRAVENOUS

## 2013-06-26 MED ORDER — IOHEXOL 350 MG/ML SOLN
100.0000 mL | Freq: Once | INTRAVENOUS | Status: AC | PRN
  Administered 2013-06-26: 100 mL via INTRAVENOUS

## 2013-06-26 MED ORDER — ALBUTEROL SULFATE HFA 108 (90 BASE) MCG/ACT IN AERS: 2.0000 | INHALATION_SPRAY | Freq: Four times a day (QID) | RESPIRATORY_TRACT | Status: DC | PRN

## 2013-06-26 MED ORDER — NITROGLYCERIN 2 % TD OINT
1.0000 [in_us] | TOPICAL_OINTMENT | Freq: Once | TRANSDERMAL | Status: AC
  Administered 2013-06-26: 1 [in_us] via TOPICAL

## 2013-06-26 MED ORDER — MESALAMINE 1.2 G PO TBEC
1200.0000 mg | DELAYED_RELEASE_TABLET | Freq: Every day | ORAL | Status: DC
  Administered 2013-06-27 – 2013-07-02 (×6): 1.2 g via ORAL
  Filled 2013-06-26 (×8): qty 1

## 2013-06-26 NOTE — H&P (Signed)
[facility] History and Physical  Jodi Frazier ZOX:[id number] DOB: [birth year] DOA: 06/26/2013  Referring physician: EDP PCP: Piedad Climes, PA-C   Chief Complaint: Chest pain   HPI: Jodi Frazier is a 79 y.o. female who presents to the ED with chest pain.  Pain has been episodic, ongoing for the past 2 weeks and worsening.  Occurs in the context of CAD with prior MI and a growing lung cancer.  Patient also reports increasing difficulty with BP control at home.  BP today right before they came to the ED was 210.  In ED initial BP 221/96.  Patient given NTG and hydralazine and BP came down to 180s systolic, chest pain improved.  CTA negative for PE (also dosent have an obvious dissection that I can appreciate / no dissection called by the radiologist).  Initial trop negative.  Review of Systems: Systems reviewed.  As above, otherwise negative  Past Medical History  Diagnosis Date  . Hypertension   . Colitis   . Coronary artery disease   . High cholesterol     takes Red Yeast Rice and Fish OIl daily  . Kidney stones   . PONV (postoperative nausea and vomiting)   . MI (myocardial infarction) 2010  . Shortness of breath     with exertion  . Pneumonia     hx of 20+yrs ago  . COPD (chronic obstructive pulmonary disease)     pleurisy or COPD exascerbation > 39yr ago  . Headache(784.0)   . Vertigo     HTN related bc gets up too fast  . Arthritis   . Bruises easily   . History of shingles 74yrs ago    in eye;occasionally gets some residual   . Accessory skin tags     arms/legs  . GERD (gastroesophageal reflux disease)   . Colitis   . Diverticulosis   . Urinary frequency   . Stress incontinence   . History of kidney stones   . Hypothyroidism     takes sYnthroid daily  . Cataract     early stage on right  . Macular degeneration, wet   . Depression     some but takes Amitryptylline nightly  . Right-sided chest wall pain 08/28/12    PRESENTATION - RIGHT  SIDED ANTERIOR CHEST PAIN  . Weight loss 08/28/12    10 LB WEIGHT LOSS OVER 3 MONTHS  . Lung mass 08/12/12    CHEST-XRAY/ PET -LOBULAR MASS LLL - 4.1 X 3.6 X 4.2 CM  . Dementia     mild  . S/P radiation therapy  10/05/2012-11/20/2012    Left Lower Lung and hilum / 70 Gy in 35 fractions  . Adenocarcinoma of lung 09/07/12    needle core bx-LLL-adenocarcinoma  . Hypertension    Past Surgical History  Procedure Laterality Date  . Hip arthroscopy  90yr ago    right hip-replacement  . Knee arthroscopy      LT  . Total knee arthroplasty  64yrs ago    left  . Appendectomy  31yrs ago  . Abdominal hysterectomy  6yrs ago   . Lithotripsy  2011  . Cataract surgery  2012    left  . Eye surgery  2yrs ago  . Bladder surgery  2001    tacked  . Coronary angioplasty with stent placement  2010    2 in rt coronary artery and 1 in another spot  . Colonoscopy    . Esophagogastroduodenoscopy    . Total  knee arthroplasty  09/09/2011    Procedure: TOTAL KNEE ARTHROPLASTY;  Surgeon: Raymon Mutton, MD;  Location: [facility] OR;  Service: Orthopedics;  Laterality: Right;  Right Total Knee Arthroplasty  . Bronchial brush biopsy Left 08/17/12    LLL Bronchial Washing / Brushing and Bronchial Biopsy: Negative for malignancy  . Left lower lobe needle core biopsy Left 09/07/12    Poorly Differentiated Adenocarcinoma   . Joint replacement     Social History:  reports that she quit smoking about 10 years ago. Her smoking use included Cigarettes. She has a 40 pack-year smoking history. She has never used smokeless tobacco. She reports that she drinks alcohol. She reports that she does not use illicit drugs.  Allergies  Allergen Reactions  . Codeine Hives  . Augmentin [Amoxicillin-Pot Clavulanate] Diarrhea and Nausea And Vomiting    Family History  Problem Relation Age of Onset  . Anesthesia problems Mother   . Hypotension Neg Hx   . Malignant hyperthermia Neg Hx   . Pseudochol deficiency Neg Hx   . Cancer  Maternal Aunt     breast     Prior to Admission medications   Medication Sig Start Date End Date Taking? Authorizing Provider  albuterol (PROVENTIL HFA;VENTOLIN HFA) 108 (90 BASE) MCG/ACT inhaler Inhale 2 puffs into the lungs every 6 (six) hours as needed for wheezing. 02/08/13  Yes Lonia Farber, MD  amitriptyline (ELAVIL) 25 MG tablet Take 25 mg by mouth at bedtime.   Yes Historical Provider, MD  amLODipine (NORVASC) 2.5 MG tablet Take 2.5 mg by mouth daily.   Yes Historical Provider, MD  aspirin 81 MG tablet Take 81 mg by mouth every evening.    Yes Historical Provider, MD  carvedilol (COREG) 6.25 MG tablet Take 6.25 mg by mouth 2 (two) times daily with a meal.    Yes Historical Provider, MD  cefUROXime (CEFTIN) 250 MG tablet Take 1 tablet (250 mg total) by mouth 2 (two) times daily with a meal. 06/18/13  Yes Piedad Climes, PA-C  Cyanocobalamin (VITAMIN B-12 IJ) Inject 1,000 mcg/mL as directed every 30 (thirty) days.   Yes Historical Provider, MD  cycloSPORINE (RESTASIS) 0.05 % ophthalmic emulsion Place 1 drop into both eyes 2 (two) times daily.    Yes Historical Provider, MD  donepezil (ARICEPT) 10 MG tablet Take 15 mg by mouth at bedtime. Takes 1 and 1/2   Yes Historical Provider, MD  escitalopram (LEXAPRO) 5 MG tablet Take 1 tablet (5 mg total) by mouth daily. 06/07/13  Yes Piedad Climes, PA-C  Fluticasone Furoate-Vilanterol (BREO ELLIPTA) 100-25 MCG/INH AEPB Inhale 100 mcg into the lungs daily. 05/04/13  Yes Leslye Peer, MD  levothyroxine (SYNTHROID) 125 MCG tablet Take 1 tablet (125 mcg total) by mouth daily before breakfast. 05/17/13  Yes Piedad Climes, PA-C  mesalamine (LIALDA) 1.2 G EC tablet Take 1,200 mg by mouth daily with breakfast.    Yes Historical Provider, MD  naproxen sodium (ANAPROX) 220 MG tablet Take 220 mg by mouth 2 (two) times daily as needed (pain).   Yes Historical Provider, MD  ondansetron (ZOFRAN) 4 MG tablet Take 1 tablet (4 mg total) by  mouth every 8 (eight) hours as needed for nausea or vomiting. 06/07/13  Yes Piedad Climes, PA-C  pantoprazole (PROTONIX) 40 MG tablet Take 40 mg by mouth every morning.    Yes Historical Provider, MD  nitroGLYCERIN (NITROSTAT) 0.4 MG SL tablet Place 0.4 mg under the tongue every 5 (five) minutes as  needed for chest pain.     Historical Provider, MD   Physical Exam: Filed Vitals:   06/26/13 2041  BP: 183/78  Pulse:   Resp:     BP 183/78  Pulse 76  Resp 13  SpO2 98%  General Appearance:    Alert, oriented, no distress, appears stated age  Head:    Normocephalic, atraumatic  Eyes:    PERRL, EOMI, sclera non-icteric        Nose:   Nares without drainage or epistaxis. Mucosa, turbinates normal  Throat:   Moist mucous membranes. Oropharynx without erythema or exudate.  Neck:   Supple. No carotid bruits.  No thyromegaly.  No lymphadenopathy.   Back:     No CVA tenderness, no spinal tenderness  Lungs:     Clear to auscultation bilaterally, without wheezes, rhonchi or rales  Chest wall:    No tenderness to palpitation  Heart:    Regular rate and rhythm without murmurs, gallops, rubs  Abdomen:     Soft, non-tender, nondistended, normal bowel sounds, no organomegaly  Genitalia:    deferred  Rectal:    deferred  Extremities:   No clubbing, cyanosis or edema.  Pulses:   2+ and symmetric all extremities  Skin:   Skin color, texture, turgor normal, no rashes or lesions  Lymph nodes:   Cervical, supraclavicular, and axillary nodes normal  Neurologic:   CNII-XII intact. Normal strength, sensation and reflexes      throughout    Labs on Admission:  Basic Metabolic Panel:  Recent Labs Lab 06/26/13 1632  NA 136  K 4.3  CL 101  CO2 23  GLUCOSE 101*  BUN 8  CREATININE 0.72  CALCIUM 10.6*   Liver Function Tests: No results found for this basename: AST, ALT, ALKPHOS, BILITOT, PROT, ALBUMIN,  in the last 168 hours No results found for this basename: LIPASE, AMYLASE,  in the last  168 hours No results found for this basename: AMMONIA,  in the last 168 hours CBC:  Recent Labs Lab 06/26/13 1632  WBC 8.4  HGB 14.1  HCT 42.5  MCV 89.3  PLT 452*   Cardiac Enzymes:  Recent Labs Lab 06/26/13 1632  TROPONINI <0.30    BNP (last 3 results)  Recent Labs  04/24/13 1029 05/10/13 1707 06/26/13 1632  PROBNP 435.7 267.10 155.8   CBG:  Recent Labs Lab 06/21/13 0657  GLUCAP 91    Radiological Exams on Admission: Ct Angio Chest Pe W/cm &/or Wo Cm  06/26/2013   CLINICAL DATA:  History of adenocarcinoma of the lung treated with radiation therapy. Left-sided chest pain.  EXAM: CT ANGIOGRAPHY CHEST WITH CONTRAST  TECHNIQUE: Multidetector CT imaging of the chest was performed using the standard protocol during bolus administration of intravenous contrast. Multiplanar CT image reconstructions including MIPs were obtained to evaluate the vascular anatomy.  CONTRAST:  OMNIPAQUE IOHEXOL 350 MG/ML SOLN  COMPARISON:  Chest CT, 06/01/2013  FINDINGS: No evidence of a pulmonary embolus.  Moderate pleural effusion on the left has increased from the prior study. There is an 18 mm by 16 mm mass posterior to the inferior left hilum, without change. Abnormal soft tissue surrounds the left hilar structures, also stable. There are additional areas of coarse reticular opacity in the left lung which are also stable, likely scarring. A few tiny reticular nodular densities are noted posteriorly in the left lower lobe and left upper lobe.  The 11 mm mass in the right upper lobe, posterior laterally, stable.  There are other areas of reticular opacity there are mostly stable. There are subtle tree in bud type opacities in the posterior right lower lobe, which are new. There are no focal areas of consolidation. Posterior and apical upper lobe scarring is stable. No right pleural effusion.  The heart is normal in size. The great vessels are normal in caliber. The largest mediastinal node is a  10.7 cm precarinal lymph node. This is stable as are the remaining sub cm nodes. No right hilar mass or adenopathy.  Limited evaluation of the upper abdomen shows a partly imaged left renal low-density lesion likely a cyst. It is stable. No adrenal masses.  The bones are demineralized. There are no osteoblastic or osteolytic lesions.  Review of the MIP images confirms the above findings.  IMPRESSION: 1. No evidence of a pulmonary embolism. 2. Changes from treatment of left lung carcinoma are without significant change. There is abnormal soft tissue with associated coarse reticular opacity centered on the left hilum extending into the left lung, which is all stable consistent with post treatment related scarring. There is also a discrete mass in the left lower lobe posterior to the left hilum that is stable. 3. Small mass in the right upper lobe is stable. No new lung masses. 4. The left pleural effusion has increased, now moderate size. 5. There are few subtle tree in bud type opacities in the right lower lobe which are new. This is nonspecific. It may reflect a small area of bronchiolitis. No other change from the prior study.   Electronically Signed   By: Amie Portland M.D.   On: 06/26/2013 18:38   Dg Chest Port 1 View  06/26/2013   CLINICAL DATA:  Left-sided chest pain.  EXAM: PORTABLE CHEST - 1 VIEW  COMPARISON:  Chest CT, 06/01/2013 and chest radiographs, 05/12/2013.  FINDINGS: There is irregular fullness of the left hilum with adjacent coarse reticular opacity extends laterally and to the left lung base. There is also coarse irregular scarring in both upper lobes. The lungs are hyperexpanded. Vague nodule is noted in the right upper lobe. No pleural effusion or pneumothorax.  Cardiac silhouette is normal in size. Mediastinum and right hilum are unremarkable.  IMPRESSION: No acute findings. No change from the prior chest radiograph allowing for differences in patient positioning.  Left hilar fullness and  perihilar and left lung base scarring, stable. Other areas of lung scarring and the right upper lobe nodule are also stable.   Electronically Signed   By: Amie Portland M.D.   On: 06/26/2013 17:05    EKG: Independently reviewed.  Assessment/Plan Principal Problem:   Chest pain Active Problems:   Hypertensive urgency   1. Chest pain - ddx includes 1. Hypertensive urgency - likely the cause of chest pain given apparent association between BP and degree of chest pain.  Pain improved with lower BP, will order PRN hydralazine and add HCTZ to medication regimen.  Goal BP < 180. 2. CAD - possible creshendo angina, troponins negative at this point, 2D echo ordered, serial trops ordered, likely needs cards eval. 3. Pain secondary to lung CA - patients lung cancer has increased in size these past 3 months, it is certianly possible and even likely that it would be causing pain at this point. 4. CTA of chest was negative for PE and radiologist did not call dissection (no obvious one on my read).    Code Status: Full  Family Communication: Daughter at bedside Disposition Plan: Admit to  inpatient   Time spent: 70 min  Javonnie Illescas M. [facility] Pager [phone]  If 7AM-7PM, please contact the day team taking care of the patient [email or web address] Password [facility] 06/26/2013, 8:56 PM

## 2013-06-26 NOTE — ED Notes (Addendum)
Pt presents with chest pain left sided, has history of  Cardiac problems, nausea and pain in left arm and neck. Pt called her doctor yesterday regarding her HTN, doubled her Coreg

## 2013-06-26 NOTE — ED Provider Notes (Signed)
CSN: [id number]     Arrival date & time 06/26/13  1619 History   First MD Initiated Contact with Patient 06/26/13 1638     Chief Complaint  Patient presents with  . Chest Pain   (Consider location/radiation/quality/duration/timing/severity/associated sxs/prior Treatment) Patient is a 79 y.o. female presenting with chest pain. The history is provided by the patient and a relative. No language interpreter was used.  Chest Pain Pain location:  L chest Pain quality: sharp   Pain radiates to:  Does not radiate Pain radiates to the back: no   Pain severity:  Moderate Onset quality:  Sudden Duration:  2 weeks Timing:  Intermittent Progression:  Waxing and waning Chronicity:  New Relieved by:  Nothing Worsened by:  Nothing tried Ineffective treatments:  None tried Associated symptoms: cough (chronic unchanged), fatigue, nausea and shortness of breath   Associated symptoms: no abdominal pain, no back pain, no diaphoresis, no fever, no headache, no numbness, no palpitations, not vomiting and no weakness   Associated symptoms comment:  No symptoms at time of CP, but has been having additional symptoms.  She has had 2 weeks of loose, dark d/a. Shortness of breath:    Severity:  Moderate   Onset quality:  Gradual   Duration:  2 days   Timing:  Constant   Progression:  Waxing and waning Risk factors: aortic disease, coronary artery disease, high cholesterol, hypertension and obesity   Risk factors: not female and no prior DVT/PE   Risk factors comment:  Lung cancer   Past Medical History  Diagnosis Date  . Hypertension   . Colitis   . Coronary artery disease   . High cholesterol     takes Red Yeast Rice and Fish OIl daily  . Kidney stones   . PONV (postoperative nausea and vomiting)   . MI (myocardial infarction) 2010  . Shortness of breath     with exertion  . Pneumonia     hx of 20+yrs ago  . COPD (chronic obstructive pulmonary disease)     pleurisy or COPD exascerbation > 89yr  ago  . Headache(784.0)   . Vertigo     HTN related bc gets up too fast  . Arthritis   . Bruises easily   . History of shingles 57yrs ago    in eye;occasionally gets some residual   . Accessory skin tags     arms/legs  . GERD (gastroesophageal reflux disease)   . Colitis   . Diverticulosis   . Urinary frequency   . Stress incontinence   . History of kidney stones   . Hypothyroidism     takes sYnthroid daily  . Cataract     early stage on right  . Macular degeneration, wet   . Depression     some but takes Amitryptylline nightly  . Right-sided chest wall pain 08/28/12    PRESENTATION - RIGHT SIDED ANTERIOR CHEST PAIN  . Weight loss 08/28/12    10 LB WEIGHT LOSS OVER 3 MONTHS  . Lung mass 08/12/12    CHEST-XRAY/ PET -LOBULAR MASS LLL - 4.1 X 3.6 X 4.2 CM  . Dementia     mild  . S/P radiation therapy  10/05/2012-11/20/2012    Left Lower Lung and hilum / 70 Gy in 35 fractions  . Adenocarcinoma of lung 09/07/12    needle core bx-LLL-adenocarcinoma  . Hypertension    Past Surgical History  Procedure Laterality Date  . Hip arthroscopy  67yr ago    right  hip-replacement  . Knee arthroscopy      LT  . Total knee arthroplasty  72yrs ago    left  . Appendectomy  35yrs ago  . Abdominal hysterectomy  51yrs ago   . Lithotripsy  2011  . Cataract surgery  2012    left  . Eye surgery  69yrs ago  . Bladder surgery  2001    tacked  . Coronary angioplasty with stent placement  2010    2 in rt coronary artery and 1 in another spot  . Colonoscopy    . Esophagogastroduodenoscopy    . Total knee arthroplasty  09/09/2011    Procedure: TOTAL KNEE ARTHROPLASTY;  Surgeon: Raymon Mutton, MD;  Location: [facility] OR;  Service: Orthopedics;  Laterality: Right;  Right Total Knee Arthroplasty  . Bronchial brush biopsy Left 08/17/12    LLL Bronchial Washing / Brushing and Bronchial Biopsy: Negative for malignancy  . Left lower lobe needle core biopsy Left 09/07/12    Poorly Differentiated Adenocarcinoma    . Joint replacement     Family History  Problem Relation Age of Onset  . Anesthesia problems Mother   . Hypotension Neg Hx   . Malignant hyperthermia Neg Hx   . Pseudochol deficiency Neg Hx   . Cancer Maternal Aunt     breast   History  Substance Use Topics  . Smoking status: Former Smoker -- 1.00 packs/day for 40 years    Types: Cigarettes    Quit date: 08/26/2002  . Smokeless tobacco: Never Used     Comment: quit several yrs ago  . Alcohol Use: Yes     Comment: 2 times week alcohol   OB History   Grav Para Term Preterm Abortions TAB SAB Ect Mult Living                 Review of Systems  Constitutional: Positive for fatigue. Negative for fever, chills, diaphoresis, activity change and appetite change.  HENT: Negative for congestion, facial swelling, rhinorrhea and sore throat.   Eyes: Negative for photophobia and discharge.  Respiratory: Positive for cough (chronic unchanged) and shortness of breath. Negative for chest tightness.   Cardiovascular: Positive for chest pain. Negative for palpitations and leg swelling.  Gastrointestinal: Positive for nausea and diarrhea. Negative for vomiting and abdominal pain.  Endocrine: Negative for polydipsia and polyuria.  Genitourinary: Negative for dysuria, frequency, difficulty urinating and pelvic pain.  Musculoskeletal: Negative for arthralgias, back pain, neck pain and neck stiffness.  Skin: Negative for color change and wound.  Allergic/Immunologic: Negative for immunocompromised state.  Neurological: Negative for facial asymmetry, weakness, numbness and headaches.  Hematological: Does not bruise/bleed easily.  Psychiatric/Behavioral: Negative for confusion and agitation.    Allergies  Codeine and Augmentin  Home Medications   No current outpatient prescriptions on file. BP 133/63  Pulse 68  Temp(Src) 97.6 F (36.4 C) (Oral)  Resp 22  Ht 5\' 11"  (1.803 m)  Wt 177 lb 14.6 oz (80.7 kg)  BMI 24.82 kg/m2  SpO2  100% Physical Exam  Constitutional: She is oriented to person, place, and time. She appears well-developed and well-nourished. No distress.  HENT:  Head: Normocephalic and atraumatic.  Mouth/Throat: No oropharyngeal exudate.  Eyes: Pupils are equal, round, and reactive to light.  Neck: Normal range of motion. Neck supple.  Cardiovascular: Normal rate, regular rhythm and normal heart sounds.  Exam reveals no gallop and no friction rub.   No murmur heard. Pulmonary/Chest: Effort normal and breath sounds normal. No  respiratory distress. She has no wheezes. She has no rales.    Abdominal: Soft. Bowel sounds are normal. She exhibits no distension and no mass. There is no tenderness. There is no rebound and no guarding.  Musculoskeletal: Normal range of motion. She exhibits no edema and no tenderness.  Neurological: She is alert and oriented to person, place, and time.  Skin: Skin is warm and dry.  Psychiatric: She has a normal mood and affect.    ED Course  Procedures (including critical care time) Labs Review Labs Reviewed  BASIC METABOLIC PANEL - Abnormal; Notable for the following:    Glucose, Bld 101 (*)    Calcium 10.6 (*)    GFR calc non Af Amer 79 (*)    All other components within normal limits  CBC - Abnormal; Notable for the following:    Platelets 452 (*)    All other components within normal limits  CBC - Abnormal; Notable for the following:    Platelets 429 (*)    All other components within normal limits  BASIC METABOLIC PANEL - Abnormal; Notable for the following:    Sodium 133 (*)    Potassium 3.2 (*)    Glucose, Bld 113 (*)    GFR calc non Af Amer 78 (*)    GFR calc Af Amer 90 (*)    All other components within normal limits  CLOSTRIDIUM DIFFICILE BY PCR  TROPONIN I  PRO B NATRIURETIC PEPTIDE  TROPONIN I  TROPONIN I  TROPONIN I  URINALYSIS W MICROSCOPIC + REFLEX CULTURE  POCT I-STAT TROPONIN I  OCCULT BLOOD, POC DEVICE   Imaging Review Ct Angio Chest  Pe W/cm &/or Wo Cm  06/26/2013   CLINICAL DATA:  History of adenocarcinoma of the lung treated with radiation therapy. Left-sided chest pain.  EXAM: CT ANGIOGRAPHY CHEST WITH CONTRAST  TECHNIQUE: Multidetector CT imaging of the chest was performed using the standard protocol during bolus administration of intravenous contrast. Multiplanar CT image reconstructions including MIPs were obtained to evaluate the vascular anatomy.  CONTRAST:  OMNIPAQUE IOHEXOL 350 MG/ML SOLN  COMPARISON:  Chest CT, 06/01/2013  FINDINGS: No evidence of a pulmonary embolus.  Moderate pleural effusion on the left has increased from the prior study. There is an 18 mm by 16 mm mass posterior to the inferior left hilum, without change. Abnormal soft tissue surrounds the left hilar structures, also stable. There are additional areas of coarse reticular opacity in the left lung which are also stable, likely scarring. A few tiny reticular nodular densities are noted posteriorly in the left lower lobe and left upper lobe.  The 11 mm mass in the right upper lobe, posterior laterally, stable. There are other areas of reticular opacity there are mostly stable. There are subtle tree in bud type opacities in the posterior right lower lobe, which are new. There are no focal areas of consolidation. Posterior and apical upper lobe scarring is stable. No right pleural effusion.  The heart is normal in size. The great vessels are normal in caliber. The largest mediastinal node is a 10.7 cm precarinal lymph node. This is stable as are the remaining sub cm nodes. No right hilar mass or adenopathy.  Limited evaluation of the upper abdomen shows a partly imaged left renal low-density lesion likely a cyst. It is stable. No adrenal masses.  The bones are demineralized. There are no osteoblastic or osteolytic lesions.  Review of the MIP images confirms the above findings.  IMPRESSION: 1. No  evidence of a pulmonary embolism. 2. Changes from treatment of left  lung carcinoma are without significant change. There is abnormal soft tissue with associated coarse reticular opacity centered on the left hilum extending into the left lung, which is all stable consistent with post treatment related scarring. There is also a discrete mass in the left lower lobe posterior to the left hilum that is stable. 3. Small mass in the right upper lobe is stable. No new lung masses. 4. The left pleural effusion has increased, now moderate size. 5. There are few subtle tree in bud type opacities in the right lower lobe which are new. This is nonspecific. It may reflect a small area of bronchiolitis. No other change from the prior study.   Electronically Signed   By: Amie Portland M.D.   On: 06/26/2013 18:38   Dg Chest Port 1 View  06/26/2013   CLINICAL DATA:  Left-sided chest pain.  EXAM: PORTABLE CHEST - 1 VIEW  COMPARISON:  Chest CT, 06/01/2013 and chest radiographs, 05/12/2013.  FINDINGS: There is irregular fullness of the left hilum with adjacent coarse reticular opacity extends laterally and to the left lung base. There is also coarse irregular scarring in both upper lobes. The lungs are hyperexpanded. Vague nodule is noted in the right upper lobe. No pleural effusion or pneumothorax.  Cardiac silhouette is normal in size. Mediastinum and right hilum are unremarkable.  IMPRESSION: No acute findings. No change from the prior chest radiograph allowing for differences in patient positioning.  Left hilar fullness and perihilar and left lung base scarring, stable. Other areas of lung scarring and the right upper lobe nodule are also stable.   Electronically Signed   By: Amie Portland M.D.   On: 06/26/2013 17:05    EKG Interpretation    Date/Time:    Ventricular Rate:    PR Interval:    QRS Duration:   QT Interval:    QTC Calculation:   R Axis:     Text Interpretation:             Date: 06/27/2013  Rate: 68  Rhythm: normal sinus rhythm  QRS Axis: normal  Intervals:  normal  ST/T Wave abnormalities: nonspecific ST/T changes, anteroseptal infarct, likely old  Conduction Disutrbances:none  Narrative Interpretation:   Old EKG Reviewed: unchanged    MDM   1. Chest pain at rest   2. Hypertension   3. Adenocarcinoma of lung, unspecified laterality   4. Hypertensive urgency   5. Adenocarcinoma of lung, left   6. COPD (chronic obstructive pulmonary disease)   7. Hypothyroidism    Pt is a 79 y.o. female with Pmhx as above including lung Ca, COPD, CAD w/ hx of MI who presents with multiple complaints including labile BP, intermittent L sided CP, SOB, d/a.  BP has been difficult to control for several weeks, was up to 200 systolic at home today.  She has had 2 weeks of intermittent L sided sharp CP, though worse past several days w/ radiation to L shoulder. She has SOB at baseline, though has been worse past 2 days.  No n/v, fever, inc leg swelling.  EKG as above.  CT PE study done as pt high risk for PE, showed no PE, stable known lung Ca.  First trop negative. BNP not elevated.  Given hx, risk factors, continued labile BP in ED, [facility] consulted for admission.         Shanna Cisco, MD 06/27/13 [id number]

## 2013-06-27 DIAGNOSIS — J90 Pleural effusion, not elsewhere classified: Secondary | ICD-10-CM

## 2013-06-27 DIAGNOSIS — J449 Chronic obstructive pulmonary disease, unspecified: Secondary | ICD-10-CM

## 2013-06-27 DIAGNOSIS — E871 Hypo-osmolality and hyponatremia: Secondary | ICD-10-CM

## 2013-06-27 DIAGNOSIS — Z87440 Personal history of urinary (tract) infections: Secondary | ICD-10-CM

## 2013-06-27 DIAGNOSIS — R110 Nausea: Secondary | ICD-10-CM

## 2013-06-27 DIAGNOSIS — R911 Solitary pulmonary nodule: Secondary | ICD-10-CM

## 2013-06-27 DIAGNOSIS — I519 Heart disease, unspecified: Secondary | ICD-10-CM

## 2013-06-27 DIAGNOSIS — C3490 Malignant neoplasm of unspecified part of unspecified bronchus or lung: Secondary | ICD-10-CM

## 2013-06-27 DIAGNOSIS — R0602 Shortness of breath: Secondary | ICD-10-CM

## 2013-06-27 DIAGNOSIS — E039 Hypothyroidism, unspecified: Secondary | ICD-10-CM

## 2013-06-27 DIAGNOSIS — E876 Hypokalemia: Secondary | ICD-10-CM

## 2013-06-27 DIAGNOSIS — I10 Essential (primary) hypertension: Principal | ICD-10-CM

## 2013-06-27 LAB — URINALYSIS W MICROSCOPIC + REFLEX CULTURE
Ketones, ur: NEGATIVE mg/dL
Leukocytes, UA: NEGATIVE
Nitrite: NEGATIVE
Urobilinogen, UA: 0.2 mg/dL (ref 0.0–1.0)
pH: 6 (ref 5.0–8.0)

## 2013-06-27 LAB — CBC
HCT: 38.5 % (ref 36.0–46.0)
MCHC: 32.2 g/dL (ref 30.0–36.0)
Platelets: 429 10*3/uL — ABNORMAL HIGH (ref 150–400)
RDW: 15.5 % (ref 11.5–15.5)

## 2013-06-27 LAB — BASIC METABOLIC PANEL
BUN: 7 mg/dL (ref 6–23)
GFR calc Af Amer: 90 mL/min — ABNORMAL LOW (ref >90)
GFR calc non Af Amer: 78 mL/min — ABNORMAL LOW (ref >90)
Glucose, Bld: 113 mg/dL — ABNORMAL HIGH (ref 70–99)
Potassium: 3.2 mEq/L — ABNORMAL LOW (ref 3.5–5.1)
Sodium: 133 mEq/L — ABNORMAL LOW (ref 135–145)

## 2013-06-27 LAB — TROPONIN I
Troponin I: <0.3 ng/mL (ref <0.30)
Troponin I: <0.3 ng/mL (ref <0.30)

## 2013-06-27 MED ORDER — POTASSIUM CHLORIDE CRYS ER 20 MEQ PO TBCR
40.0000 meq | EXTENDED_RELEASE_TABLET | Freq: Once | ORAL | Status: AC
  Administered 2013-06-27: 40 meq via ORAL
  Filled 2013-06-27: qty 2

## 2013-06-27 MED ORDER — PROMETHAZINE HCL 25 MG/ML IJ SOLN
12.5000 mg | Freq: Once | INTRAMUSCULAR | Status: AC
  Administered 2013-06-27: 12.5 mg via INTRAVENOUS
  Filled 2013-06-27: qty 1

## 2013-06-27 NOTE — Progress Notes (Signed)
Eden Emms (daughter) Phone numbers:   [phone]  [phone] (cell)

## 2013-06-27 NOTE — Progress Notes (Signed)
[facility] PROGRESS NOTE  DELORAS REICHARD NWG:[id number] DOB: April 26, 1931 DOA: 06/26/2013 PCP: Piedad Climes, PA-C  Assessment/Plan: Atypical chest pain -Troponins negative -EKG without any acute ischemic changes -Continue aspirin daily -Check lipids in the morning  - CT angiogram chest negative for pulmonary embolus  -may need outpt stress test Pleural effusion  -Suspect malignant effusion  -Increased in size compared to prior CT  -Currently stable without any distress  -Consult medical oncology  -hold off on thoracocentesis until oncology opinion as pt is stable Hypertensive urgency  -Improved  -Continue HCTZ, amlodipine and carvedilol  Adenocarcinoma of lung  -06/21/2013 PET scan shows hypermetabolic RUL nodule with new left suprahilar metabolic tissue concerning for recurrence -spoke with Dr. Bertis Ruddy for consult -last XRT on 11/20/12 -MRI brain neg for mets COPD -Stable -Continue BREO and SABA Recent UTI -recheck UA with reflex to urine culture -finish 3 more days of cefuroxime Hypokalemia -Replete -Check magnesium Family Communication:   daughter at beside Disposition Plan:   Home when medically stable   Antibiotics:  cefuroxime    Procedures/Studies: Ct Chest W Contrast  06/01/2013   CLINICAL DATA:  Adenocarcinoma along the radiation therapy complete.  EXAM: CT CHEST AND ABDOMEN WITH CONTRAST  TECHNIQUE: Multidetector CT imaging of the chest and abdomen was performed following the standard protocol during bolus administration of intravenous contrast.  CONTRAST:  OMNIPAQUE IOHEXOL 300 MG/ML  SOLN  COMPARISON:  CT chest 04/24/2013, 02/22/2013  FINDINGS: CT CHEST FINDINGS  There is interval resolution of the left pneumothorax seen on prior. Nodule within the left lower lobe measures 16 x 19 mm compared to 15 x 18 mm on prior for no significant change. Peripheral linear nodularity in the lingula is similar. There is small left effusion which is  unchanged.  In the right upper lobe, irregular nodule measures 11 mm compared to 8 mm on prior (image 17). There is biapical pleural parenchymal thickening posteriorly not changed from prior.  Small paratracheal lymph nodes are unchanged and less than 10 mm. No supraclavicular lymphadenopathy. No axillary adenopathy.  CT ABDOMEN FINDINGS  No focal hepatic lesion. The gallbladder, pancreas, spleen, adrenal glands, and kidneys are unchanged. There are simple cysts within the left and right kidney. There is a linear calcification in the right renal collecting system seen best on the coronal images. There is indeterminate on prior CT.  Stomach, small bowel and cecum are normal. Diverticulum of the descending colon.  No retroperitoneal periportal lymphadenopathy.  Within the pelvis, there is a rounded cystic lesion associated with the right adnexa measuring 3.6 cm is again noted. No aggressive osseous lesion. Right hip arthroplasty noted. (The pelvis was image by mistake and not charge to the patient).  IMPRESSION: 1. Left upper lobe nodule is stable compared to prior. 2. Right upper lobe nodule is increased in size and concerning for bronchogenic carcinoma. 3. Stable linear thickening in the  left upper lobe. 4. Linear density in the right renal collecting system likely represents a calculus. Bilateral nonenhancing renal cysts. 5. No evidence metastasis below diaphragms. 6. Stable right adnexal cystic lesion.   Electronically Signed   By: Genevive Bi M.D.   On: 06/01/2013 16:55   Ct Angio Chest Pe W/cm &/or Wo Cm  06/26/2013   CLINICAL DATA:  History of adenocarcinoma of the lung treated with radiation therapy. Left-sided chest pain.  EXAM: CT ANGIOGRAPHY CHEST WITH CONTRAST  TECHNIQUE: Multidetector CT imaging of the chest was performed using the standard protocol during bolus administration  of intravenous contrast. Multiplanar CT image reconstructions including MIPs were obtained to evaluate the vascular  anatomy.  CONTRAST:  OMNIPAQUE IOHEXOL 350 MG/ML SOLN  COMPARISON:  Chest CT, 06/01/2013  FINDINGS: No evidence of a pulmonary embolus.  Moderate pleural effusion on the left has increased from the prior study. There is an 18 mm by 16 mm mass posterior to the inferior left hilum, without change. Abnormal soft tissue surrounds the left hilar structures, also stable. There are additional areas of coarse reticular opacity in the left lung which are also stable, likely scarring. A few tiny reticular nodular densities are noted posteriorly in the left lower lobe and left upper lobe.  The 11 mm mass in the right upper lobe, posterior laterally, stable. There are other areas of reticular opacity there are mostly stable. There are subtle tree in bud type opacities in the posterior right lower lobe, which are new. There are no focal areas of consolidation. Posterior and apical upper lobe scarring is stable. No right pleural effusion.  The heart is normal in size. The great vessels are normal in caliber. The largest mediastinal node is a 10.7 cm precarinal lymph node. This is stable as are the remaining sub cm nodes. No right hilar mass or adenopathy.  Limited evaluation of the upper abdomen shows a partly imaged left renal low-density lesion likely a cyst. It is stable. No adrenal masses.  The bones are demineralized. There are no osteoblastic or osteolytic lesions.  Review of the MIP images confirms the above findings.  IMPRESSION: 1. No evidence of a pulmonary embolism. 2. Changes from treatment of left lung carcinoma are without significant change. There is abnormal soft tissue with associated coarse reticular opacity centered on the left hilum extending into the left lung, which is all stable consistent with post treatment related scarring. There is also a discrete mass in the left lower lobe posterior to the left hilum that is stable. 3. Small mass in the right upper lobe is stable. No new lung masses. 4. The left  pleural effusion has increased, now moderate size. 5. There are few subtle tree in bud type opacities in the right lower lobe which are new. This is nonspecific. It may reflect a small area of bronchiolitis. No other change from the prior study.   Electronically Signed   By: Amie Portland M.D.   On: 06/26/2013 18:38   Mr Laqueta Jean NW Contrast  06/11/2013   CLINICAL DATA:  Adenocarcinoma of lung. Nausea. Rule out metastatic disease.  EXAM: MRI HEAD WITHOUT AND WITH CONTRAST  TECHNIQUE: Multiplanar, multiecho pulse sequences of the brain and surrounding structures were obtained without and with intravenous contrast.  CONTRAST:  17mL MULTIHANCE GADOBENATE DIMEGLUMINE 529 MG/ML IV SOLN  COMPARISON:  MRI head 09/07/2012  FINDINGS: Generalized atrophy. Minimal chronic microvascular ischemic type changes in the white matter. Negative for acute infarct.  Negative for hemorrhage or mass.  No edema or midline shift.  Normal enhancement following contrast administration.  Paranasal sinuses are clear.  IMPRESSION: Negative for metastatic disease to the brain. No acute abnormality. No change from prior MRI.   Electronically Signed   By: Marlan Palau M.D.   On: 06/11/2013 15:47   Ct Abdomen Pelvis W Contrast  06/01/2013   CLINICAL DATA:  Adenocarcinoma along the radiation therapy complete.  EXAM: CT CHEST AND ABDOMEN WITH CONTRAST  TECHNIQUE: Multidetector CT imaging of the chest and abdomen was performed following the standard protocol during bolus administration of intravenous contrast.  CONTRAST:  OMNIPAQUE IOHEXOL 300 MG/ML  SOLN  COMPARISON:  CT chest 04/24/2013, 02/22/2013  FINDINGS: CT CHEST FINDINGS  There is interval resolution of the left pneumothorax seen on prior. Nodule within the left lower lobe measures 16 x 19 mm compared to 15 x 18 mm on prior for no significant change. Peripheral linear nodularity in the lingula is similar. There is small left effusion which is unchanged.  In the right upper lobe,  irregular nodule measures 11 mm compared to 8 mm on prior (image 17). There is biapical pleural parenchymal thickening posteriorly not changed from prior.  Small paratracheal lymph nodes are unchanged and less than 10 mm. No supraclavicular lymphadenopathy. No axillary adenopathy.  CT ABDOMEN FINDINGS  No focal hepatic lesion. The gallbladder, pancreas, spleen, adrenal glands, and kidneys are unchanged. There are simple cysts within the left and right kidney. There is a linear calcification in the right renal collecting system seen best on the coronal images. There is indeterminate on prior CT.  Stomach, small bowel and cecum are normal. Diverticulum of the descending colon.  No retroperitoneal periportal lymphadenopathy.  Within the pelvis, there is a rounded cystic lesion associated with the right adnexa measuring 3.6 cm is again noted. No aggressive osseous lesion. Right hip arthroplasty noted. (The pelvis was image by mistake and not charge to the patient).  IMPRESSION: 1. Left upper lobe nodule is stable compared to prior. 2. Right upper lobe nodule is increased in size and concerning for bronchogenic carcinoma. 3. Stable linear thickening in the  left upper lobe. 4. Linear density in the right renal collecting system likely represents a calculus. Bilateral nonenhancing renal cysts. 5. No evidence metastasis below diaphragms. 6. Stable right adnexal cystic lesion.   Electronically Signed   By: Genevive Bi M.D.   On: 06/01/2013 16:55   Nm Pet Image Restag (ps) Skull Base To Thigh  06/21/2013   CLINICAL DATA:  Subsequent treatment strategy for lung cancer lung cancer with new solitary pulmonary,.  EXAM: NUCLEAR MEDICINE PET SKULL BASE TO THIGH  FASTING BLOOD GLUCOSE:  Value: 91mg /dl  TECHNIQUE: 16.1 mCi W-96 FDG was injected intravenously. CT data was obtained and used for attenuation correction and anatomic localization only. (This was not acquired as a diagnostic CT examination.) Additional exam  technical data entered on technologist worksheet.  COMPARISON:  CT ABD/PELVIS W CM dated 06/01/2013  FINDINGS: NECK  No hypermetabolic lymph nodes in the neck.  CHEST  The right upper lobe pulmonary nodule of concern measuring 12 mm (image 78, series 2) does have associated metabolic activity with SUV max 3.8 consistent with neoplasm. The left lower lobe pulmonary nodule measuring 20 mm (image 91, series 2) does not have significant metabolic activity; however, there is intense metabolic activity and fullness in the left suprahilar region (image 88)with SUV max = 7.0. There is a focus of metabolic activity within the medial aspect the lingula which could represent postobstructive pneumonitis or malignancy (SUV max 6.7 image 69).  There is mild metabolic nodularity within the periphery of the right upper lobe (image 60) which is indeterminate. Metabolic activity within the thyroid gland is likely related to thyroiditis.  ABDOMEN/PELVIS  No abnormal hypermetabolic activity within the liver, pancreas, adrenal glands, or spleen. No hypermetabolic lymph nodes in the abdomen or pelvis. No metabolic activity associated with this cystic lesion the right adnexa. Scattered foci of uptake noted in the sigmoid colon likely related to diverticulosis.  SKELETON  No focal hypermetabolic activity to suggest skeletal  metastasis.  IMPRESSION: 1. Hypermetabolic right upper lobe pulmonary nodule consistent with either primary bronchogenic carcinoma or metastasis. 2. New left suprahilar metabolic tissue surrounding the upper lobe bronchus is most consistent with lung cancer recurrence. 3. Focus of hypermetabolic consolidation in the lingula either represents a neoplasm versus postobstructive pneumonitis. Favor the latter. 4. Metabolic activity associated with nodular pleural parenchymal thickening at the right lung apex is indeterminate and may be inflammatory. 5. The left lower lobe nodule is not significant hypermetabolic.    Electronically Signed   By: Genevive Bi M.D.   On: 06/21/2013 10:48   Dg Chest Port 1 View  06/26/2013   CLINICAL DATA:  Left-sided chest pain.  EXAM: PORTABLE CHEST - 1 VIEW  COMPARISON:  Chest CT, 06/01/2013 and chest radiographs, 05/12/2013.  FINDINGS: There is irregular fullness of the left hilum with adjacent coarse reticular opacity extends laterally and to the left lung base. There is also coarse irregular scarring in both upper lobes. The lungs are hyperexpanded. Vague nodule is noted in the right upper lobe. No pleural effusion or pneumothorax.  Cardiac silhouette is normal in size. Mediastinum and right hilum are unremarkable.  IMPRESSION: No acute findings. No change from the prior chest radiograph allowing for differences in patient positioning.  Left hilar fullness and perihilar and left lung base scarring, stable. Other areas of lung scarring and the right upper lobe nodule are also stable.   Electronically Signed   By: Amie Portland M.D.   On: 06/26/2013 17:05         Subjective:  vision states her breathing is improved. Denies fevers, chills, vomiting, abdominal pain, diarrhea, dysuria, hematuria, rashes. She had one loose stool yesterday. No diarrhea. No hematochezia or melena.  Objective: Filed Vitals:   06/26/13 2041 06/26/13 2053 06/26/13 2137 06/27/13 0500  BP: 183/78 167/66 177/67 133/63  Pulse:  85 76 68  Temp:   97.4 F (36.3 C) 97.6 F (36.4 C)  TempSrc:   Oral Oral  Resp:  24 26 22   Height:   5\' 11"  (1.803 m)   Weight:   80.7 kg (177 lb 14.6 oz)   SpO2:  98% 99% 100%    Intake/Output Summary (Last 24 hours) at 06/27/13 0943 Last data filed at 06/27/13 0746  Gross per 24 hour  Intake      0 ml  Output    300 ml  Net   -300 ml   Weight change:  Exam:   General:  Pt is alert, follows commands appropriately, not in acute distress  HEENT: No icterus, No thrush,  [facility]/AT  Cardiovascular: RRR, S1/S2, no rubs, no gallops  Respiratory: bibasilar  crackles. Diminished breath sounds on the right. No wheezing. Good air movement.   Abdomen: Soft/+BS, non tender, non distended, no guarding  Extremities: No edema, No lymphangitis, No petechiae, No rashes, no synovitis  Data Reviewed: Basic Metabolic Panel:  Recent Labs Lab 06/26/13 1632 06/27/13 0418  NA 136 133*  K 4.3 3.2*  CL 101 97  CO2 23 23  GLUCOSE 101* 113*  BUN 8 7  CREATININE 0.72 0.76  CALCIUM 10.6* 10.0   Liver Function Tests: No results found for this basename: AST, ALT, ALKPHOS, BILITOT, PROT, ALBUMIN,  in the last 168 hours No results found for this basename: LIPASE, AMYLASE,  in the last 168 hours No results found for this basename: AMMONIA,  in the last 168 hours CBC:  Recent Labs Lab 06/26/13 1632 06/27/13 0418  WBC 8.4 6.6  HGB 14.1 12.4  HCT 42.5 38.5  MCV 89.3 89.5  PLT 452* 429*   Cardiac Enzymes:  Recent Labs Lab 06/26/13 1632 06/26/13 2128 06/27/13 0418  TROPONINI <0.30 <0.30 <0.30   BNP: No components found with this basename: POCBNP,  CBG:  Recent Labs Lab 06/21/13 0657  GLUCAP 91    No results found for this or any previous visit (from the past 240 hour(s)).   Scheduled Meds: . amitriptyline  25 mg Oral QHS  . amLODipine  2.5 mg Oral Daily  . aspirin  81 mg Oral QPM  . carvedilol  6.25 mg Oral BID WC  . cefUROXime  250 mg Oral BID WC  . cycloSPORINE  1 drop Both Eyes BID  . donepezil  15 mg Oral QHS  . escitalopram  5 mg Oral Daily  . heparin  5,000 Units Subcutaneous Q8H  . hydrochlorothiazide  12.5 mg Oral Daily  . levothyroxine  125 mcg Oral QAC breakfast  . mesalamine  1,200 mg Oral Q breakfast  . pantoprazole  40 mg Oral q morning - 10a  . sodium chloride  3 mL Intravenous Q12H   Continuous Infusions:    Emmett Arntz, DO  [facility] Pager [phone]  If 7PM-7AM, please contact night-coverage [email or web address] Password [facility] 06/27/2013, 9:43 AM   LOS: 1 day

## 2013-06-27 NOTE — Progress Notes (Signed)
Thank you for consulting the Palliative Medicine Team at [facility] to meet your patient's and family's needs.   The reason that you asked [place] to see your patient is  For GOC  We have scheduled your patient for a meeting: 12/29 at 9 am  The Surrogate decision make is: Daughters to co decide. Truddie Hidden and Kae Heller information: [phone]  Other family members that need to be present:     Your patient is able/unable to participate: seemingly although judgement and insight are slightly off  Additional Narrative:  Daughter Truddie Hidden is a Engineer, civil (consulting) at [employer] .  Works night shift so try to call late afternoons.  Emmery Seiler L. Ladona Ridgel, MD MBA The Palliative Medicine Team at [facility] Phone: [phone] Pager: [phone]

## 2013-06-27 NOTE — Progress Notes (Signed)
[facility] CONSULT NOTE  Patient Care Team: Piedad Climes, PA-C as PCP - General (Physician Assistant) Bethanie Dicker, MD (Pulmonary Disease) Alleen Borne, MD as Attending Physician (Cardiothoracic Surgery)  CHIEF COMPLAINTS/PURPOSE OF CONSULTATION:  Lung cancer, T2, N1, M1, adenocarcinoma, EGFR/ALK mutation unknown  HISTORY OF PRESENTING ILLNESS:  Jodi Frazier 79 y.o. female is here because of sensation of shortness of breath. The patient was diagnosed with lung cancer early this year and had radiation therapy under Dr. Basilio Cairo. The patient did not show up in the lung cancer clinic because she does not want to receive systemic chemotherapy. I reviewed her records extensively. Most recently, she was hospitalized because of spontaneous pneumothorax on the left chest which subsequently resolved. The patient also had weakness and have recurrent falls home and recently injured her right hip. This hospitalization was because of sensation of shortness of breath and hypertensive urgency. Her most recent imaging studies show enlargement of the right upper lobe nodule. Previously she was treated with radiation therapy targeting the left lower lobe mass which was biopsy proven to be adenocarcinoma. The plan from radiation oncologist was to give her SB RT treatment to the right upper lobe When I questioned the patient why she never saw oncologists, she said that her husband died of lung cancer 15 years ago and is either friends also suffer from chemotherapy side effects and she does not wish to go through side effects of systemic chemotherapy. Most recently, she was placed on long-term corticosteroids due to radiation induced pneumonitis. She started feeling bad after she stopped taking steroids. She is profoundly weak. She have persistent nausea on a regular basis. MEDICAL HISTORY:  Past Medical History  Diagnosis Date  . Hypertension   . Colitis   . Coronary artery disease    . High cholesterol     takes Red Yeast Rice and Fish OIl daily  . Kidney stones   . PONV (postoperative nausea and vomiting)   . MI (myocardial infarction) 2010  . Shortness of breath     with exertion  . Pneumonia     hx of 20+yrs ago  . COPD (chronic obstructive pulmonary disease)     pleurisy or COPD exascerbation > 63yr ago  . Headache(784.0)   . Vertigo     HTN related bc gets up too fast  . Arthritis   . Bruises easily   . History of shingles 95yrs ago    in eye;occasionally gets some residual   . Accessory skin tags     arms/legs  . GERD (gastroesophageal reflux disease)   . Colitis   . Diverticulosis   . Urinary frequency   . Stress incontinence   . History of kidney stones   . Hypothyroidism     takes sYnthroid daily  . Cataract     early stage on right  . Macular degeneration, wet   . Depression     some but takes Amitryptylline nightly  . Right-sided chest wall pain 08/28/12    PRESENTATION - RIGHT SIDED ANTERIOR CHEST PAIN  . Weight loss 08/28/12    10 LB WEIGHT LOSS OVER 3 MONTHS  . Lung mass 08/12/12    CHEST-XRAY/ PET -LOBULAR MASS LLL - 4.1 X 3.6 X 4.2 CM  . Dementia     mild  . S/P radiation therapy  10/05/2012-11/20/2012    Left Lower Lung and hilum / 70 Gy in 35 fractions  . Adenocarcinoma of lung 09/07/12    needle  core bx-LLL-adenocarcinoma  . Hypertension     SURGICAL HISTORY: Past Surgical History  Procedure Laterality Date  . Hip arthroscopy  55yr ago    right hip-replacement  . Knee arthroscopy      LT  . Total knee arthroplasty  72yrs ago    left  . Appendectomy  14yrs ago  . Abdominal hysterectomy  45yrs ago   . Lithotripsy  2011  . Cataract surgery  2012    left  . Eye surgery  13yrs ago  . Bladder surgery  2001    tacked  . Coronary angioplasty with stent placement  2010    2 in rt coronary artery and 1 in another spot  . Colonoscopy    . Esophagogastroduodenoscopy    . Total knee arthroplasty  09/09/2011    Procedure: TOTAL  KNEE ARTHROPLASTY;  Surgeon: Raymon Mutton, MD;  Location: [facility] OR;  Service: Orthopedics;  Laterality: Right;  Right Total Knee Arthroplasty  . Bronchial brush biopsy Left 08/17/12    LLL Bronchial Washing / Brushing and Bronchial Biopsy: Negative for malignancy  . Left lower lobe needle core biopsy Left 09/07/12    Poorly Differentiated Adenocarcinoma   . Joint replacement      SOCIAL HISTORY: History   Social History  . Marital Status: Widowed    Spouse Name: N/A    Number of Children: 4  . Years of Education: N/A   Occupational History  . retired     Audiological scientist   Social History Main Topics  . Smoking status: Former Smoker -- 1.00 packs/day for 40 years    Types: Cigarettes    Quit date: 08/26/2002  . Smokeless tobacco: Never Used     Comment: quit several yrs ago  . Alcohol Use: Yes     Comment: 2 times week alcohol  . Drug Use: No  . Sexual Activity: No   Other Topics Concern  . Not on file   Social History Narrative   Daughter is nurse in neuro ICU    FAMILY HISTORY: Family History  Problem Relation Age of Onset  . Anesthesia problems Mother   . Hypotension Neg Hx   . Malignant hyperthermia Neg Hx   . Pseudochol deficiency Neg Hx   . Cancer Maternal Aunt     breast    ALLERGIES:  is allergic to codeine and augmentin.  MEDICATIONS:  Current Facility-Administered Medications  Medication Dose Route Frequency Provider Last Rate Last Dose  . albuterol (PROVENTIL HFA;VENTOLIN HFA) 108 (90 BASE) MCG/ACT inhaler 2 puff  2 puff Inhalation Q6H PRN Hillary Bow, DO      . amitriptyline (ELAVIL) tablet 25 mg  25 mg Oral QHS Hillary Bow, DO   25 mg at 06/26/13 2259  . amLODipine (NORVASC) tablet 2.5 mg  2.5 mg Oral Daily Hillary Bow, DO   2.5 mg at 06/27/13 0946  . aspirin chewable tablet 81 mg  81 mg Oral QPM Hillary Bow, DO   81 mg at 06/26/13 2110  . carvedilol (COREG) tablet 6.25 mg  6.25 mg Oral BID WC Hillary Bow, DO   6.25 mg at 06/27/13  4782  . cefUROXime (CEFTIN) tablet 250 mg  250 mg Oral BID WC Hillary Bow, DO   250 mg at 06/27/13 9562  . cycloSPORINE (RESTASIS) 0.05 % ophthalmic emulsion 1 drop  1 drop Both Eyes BID Hillary Bow, DO   1 drop at 06/27/13 0947  . donepezil (ARICEPT) tablet  15 mg  15 mg Oral QHS Hillary Bow, DO   15 mg at 06/26/13 2259  . escitalopram (LEXAPRO) tablet 5 mg  5 mg Oral Daily Hillary Bow, DO   5 mg at 06/27/13 0949  . heparin injection 5,000 Units  5,000 Units Subcutaneous Q8H Hillary Bow, DO   5,000 Units at 06/27/13 1355  . hydrALAZINE (APRESOLINE) injection 10 mg  10 mg Intravenous Q4H PRN Hillary Bow, DO      . hydrochlorothiazide (MICROZIDE) capsule 12.5 mg  12.5 mg Oral Daily Hillary Bow, DO   12.5 mg at 06/27/13 0950  . HYDROcodone-acetaminophen (NORCO/VICODIN) 5-325 MG per tablet 1-2 tablet  1-2 tablet Oral Q6H PRN Hillary Bow, DO   1 tablet at 06/27/13 0709  . levothyroxine (SYNTHROID, LEVOTHROID) tablet 125 mcg  125 mcg Oral QAC breakfast Hillary Bow, DO   125 mcg at 06/27/13 4540  . mesalamine (LIALDA) EC tablet 1.2 g  1,200 mg Oral Q breakfast Hillary Bow, DO   1.2 g at 06/27/13 9811  . naproxen sodium (ANAPROX) tablet 275 mg  275 mg Oral BID PRN Hillary Bow, DO      . ondansetron [facility]) tablet 4 mg  4 mg Oral Q8H PRN Hillary Bow, DO      . pantoprazole (PROTONIX) EC tablet 40 mg  40 mg Oral q morning - 10a Hillary Bow, DO   40 mg at 06/27/13 0950  . sodium chloride 0.9 % injection 3 mL  3 mL Intravenous Q12H Hillary Bow, DO   3 mL at 06/26/13 2259    REVIEW OF SYSTEMS:   Constitutional: Denies fevers, chills or abnormal night sweats Eyes: Denies blurriness of vision, double vision or watery eyes Ears, nose, mouth, throat, and face: Denies mucositis or sore throat Cardiovascular: Denies palpitation, chest discomfort or lower extremity swelling Skin: Denies abnormal skin rashes Lymphatics: Denies new lymphadenopathy or easy  bruising Neurological:Denies numbness, tingling or new weaknesses Behavioral/Psych: Mood is stable, no new changes  All other systems were reviewed with the patient and are negative.  PHYSICAL EXAMINATION: ECOG PERFORMANCE STATUS: 2 - Symptomatic, <50% confined to bed  Filed Vitals:   06/27/13 1330  BP: 131/68  Pulse: 61  Temp: 97.8 F (36.6 C)  Resp: 16   Filed Weights   06/26/13 2137  Weight: 177 lb 14.6 oz (80.7 kg)    GENERAL:alert, no distress and comfortable SKIN: skin color, texture, turgor are normal, no rashes or significant lesions EYES: normal, conjunctiva are pink and non-injected, sclera clear OROPHARYNX:no exudate, no erythema and lips, buccal mucosa, and tongue normal  NECK: supple, thyroid normal size, non-tender, without nodularity LYMPH:  no palpable lymphadenopathy in the cervical, axillary or inguinal LUNGS: clear to auscultation and percussion with normal breathing effort HEART: regular rate & rhythm and no murmurs and no lower extremity edema ABDOMEN:abdomen soft, non-tender and normal bowel sounds Musculoskeletal:no cyanosis of digits and no clubbing  PSYCH: alert & oriented x 3 with fluent speech NEURO: no focal motor/sensory deficits  LABORATORY DATA:  I have reviewed the data as listed Lab Results  Component Value Date   WBC 6.6 06/27/2013   HGB 12.4 06/27/2013   HCT 38.5 06/27/2013   MCV 89.5 06/27/2013   PLT 429* 06/27/2013   Lab Results  Component Value Date   NA 133* 06/27/2013   K 3.2* 06/27/2013   CL 97 06/27/2013   CO2 23 06/27/2013    RADIOGRAPHIC  STUDIES: I have personally reviewed the radiological images as listed and agreed with the findings in the report. Ct Chest W Contrast  06/01/2013   CLINICAL DATA:  Adenocarcinoma along the radiation therapy complete.  EXAM: CT CHEST AND ABDOMEN WITH CONTRAST  TECHNIQUE: Multidetector CT imaging of the chest and abdomen was performed following the standard protocol during bolus  administration of intravenous contrast.  CONTRAST:  OMNIPAQUE IOHEXOL 300 MG/ML  SOLN  COMPARISON:  CT chest 04/24/2013, 02/22/2013  FINDINGS: CT CHEST FINDINGS  There is interval resolution of the left pneumothorax seen on prior. Nodule within the left lower lobe measures 16 x 19 mm compared to 15 x 18 mm on prior for no significant change. Peripheral linear nodularity in the lingula is similar. There is small left effusion which is unchanged.  In the right upper lobe, irregular nodule measures 11 mm compared to 8 mm on prior (image 17). There is biapical pleural parenchymal thickening posteriorly not changed from prior.  Small paratracheal lymph nodes are unchanged and less than 10 mm. No supraclavicular lymphadenopathy. No axillary adenopathy.  CT ABDOMEN FINDINGS  No focal hepatic lesion. The gallbladder, pancreas, spleen, adrenal glands, and kidneys are unchanged. There are simple cysts within the left and right kidney. There is a linear calcification in the right renal collecting system seen best on the coronal images. There is indeterminate on prior CT.  Stomach, small bowel and cecum are normal. Diverticulum of the descending colon.  No retroperitoneal periportal lymphadenopathy.  Within the pelvis, there is a rounded cystic lesion associated with the right adnexa measuring 3.6 cm is again noted. No aggressive osseous lesion. Right hip arthroplasty noted. (The pelvis was image by mistake and not charge to the patient).  IMPRESSION: 1. Left upper lobe nodule is stable compared to prior. 2. Right upper lobe nodule is increased in size and concerning for bronchogenic carcinoma. 3. Stable linear thickening in the  left upper lobe. 4. Linear density in the right renal collecting system likely represents a calculus. Bilateral nonenhancing renal cysts. 5. No evidence metastasis below diaphragms. 6. Stable right adnexal cystic lesion.   Electronically Signed   By: Genevive Bi M.D.   On: 06/01/2013 16:55    Ct Angio Chest Pe W/cm &/or Wo Cm  06/26/2013   CLINICAL DATA:  History of adenocarcinoma of the lung treated with radiation therapy. Left-sided chest pain.  EXAM: CT ANGIOGRAPHY CHEST WITH CONTRAST  TECHNIQUE: Multidetector CT imaging of the chest was performed using the standard protocol during bolus administration of intravenous contrast. Multiplanar CT image reconstructions including MIPs were obtained to evaluate the vascular anatomy.  CONTRAST:  OMNIPAQUE IOHEXOL 350 MG/ML SOLN  COMPARISON:  Chest CT, 06/01/2013  FINDINGS: No evidence of a pulmonary embolus.  Moderate pleural effusion on the left has increased from the prior study. There is an 18 mm by 16 mm mass posterior to the inferior left hilum, without change. Abnormal soft tissue surrounds the left hilar structures, also stable. There are additional areas of coarse reticular opacity in the left lung which are also stable, likely scarring. A few tiny reticular nodular densities are noted posteriorly in the left lower lobe and left upper lobe.  The 11 mm mass in the right upper lobe, posterior laterally, stable. There are other areas of reticular opacity there are mostly stable. There are subtle tree in bud type opacities in the posterior right lower lobe, which are new. There are no focal areas of consolidation. Posterior and apical  upper lobe scarring is stable. No right pleural effusion.  The heart is normal in size. The great vessels are normal in caliber. The largest mediastinal node is a 10.7 cm precarinal lymph node. This is stable as are the remaining sub cm nodes. No right hilar mass or adenopathy.  Limited evaluation of the upper abdomen shows a partly imaged left renal low-density lesion likely a cyst. It is stable. No adrenal masses.  The bones are demineralized. There are no osteoblastic or osteolytic lesions.  Review of the MIP images confirms the above findings.  IMPRESSION: 1. No evidence of a pulmonary embolism. 2. Changes  from treatment of left lung carcinoma are without significant change. There is abnormal soft tissue with associated coarse reticular opacity centered on the left hilum extending into the left lung, which is all stable consistent with post treatment related scarring. There is also a discrete mass in the left lower lobe posterior to the left hilum that is stable. 3. Small mass in the right upper lobe is stable. No new lung masses. 4. The left pleural effusion has increased, now moderate size. 5. There are few subtle tree in bud type opacities in the right lower lobe which are new. This is nonspecific. It may reflect a small area of bronchiolitis. No other change from the prior study.   Electronically Signed   By: Amie Portland M.D.   On: 06/26/2013 18:38   Mr Laqueta Jean UJ Contrast  06/11/2013   CLINICAL DATA:  Adenocarcinoma of lung. Nausea. Rule out metastatic disease.  EXAM: MRI HEAD WITHOUT AND WITH CONTRAST  TECHNIQUE: Multiplanar, multiecho pulse sequences of the brain and surrounding structures were obtained without and with intravenous contrast.  CONTRAST:  17mL MULTIHANCE GADOBENATE DIMEGLUMINE 529 MG/ML IV SOLN  COMPARISON:  MRI head 09/07/2012  FINDINGS: Generalized atrophy. Minimal chronic microvascular ischemic type changes in the white matter. Negative for acute infarct.  Negative for hemorrhage or mass.  No edema or midline shift.  Normal enhancement following contrast administration.  Paranasal sinuses are clear.  IMPRESSION: Negative for metastatic disease to the brain. No acute abnormality. No change from prior MRI.   Electronically Signed   By: Marlan Palau M.D.   On: 06/11/2013 15:47   Ct Abdomen Pelvis W Contrast  06/01/2013   CLINICAL DATA:  Adenocarcinoma along the radiation therapy complete.  EXAM: CT CHEST AND ABDOMEN WITH CONTRAST  TECHNIQUE: Multidetector CT imaging of the chest and abdomen was performed following the standard protocol during bolus administration of intravenous  contrast.  CONTRAST:  OMNIPAQUE IOHEXOL 300 MG/ML  SOLN  COMPARISON:  CT chest 04/24/2013, 02/22/2013  FINDINGS: CT CHEST FINDINGS  There is interval resolution of the left pneumothorax seen on prior. Nodule within the left lower lobe measures 16 x 19 mm compared to 15 x 18 mm on prior for no significant change. Peripheral linear nodularity in the lingula is similar. There is small left effusion which is unchanged.  In the right upper lobe, irregular nodule measures 11 mm compared to 8 mm on prior (image 17). There is biapical pleural parenchymal thickening posteriorly not changed from prior.  Small paratracheal lymph nodes are unchanged and less than 10 mm. No supraclavicular lymphadenopathy. No axillary adenopathy.  CT ABDOMEN FINDINGS  No focal hepatic lesion. The gallbladder, pancreas, spleen, adrenal glands, and kidneys are unchanged. There are simple cysts within the left and right kidney. There is a linear calcification in the right renal collecting system seen best on the coronal  images. There is indeterminate on prior CT.  Stomach, small bowel and cecum are normal. Diverticulum of the descending colon.  No retroperitoneal periportal lymphadenopathy.  Within the pelvis, there is a rounded cystic lesion associated with the right adnexa measuring 3.6 cm is again noted. No aggressive osseous lesion. Right hip arthroplasty noted. (The pelvis was image by mistake and not charge to the patient).  IMPRESSION: 1. Left upper lobe nodule is stable compared to prior. 2. Right upper lobe nodule is increased in size and concerning for bronchogenic carcinoma. 3. Stable linear thickening in the  left upper lobe. 4. Linear density in the right renal collecting system likely represents a calculus. Bilateral nonenhancing renal cysts. 5. No evidence metastasis below diaphragms. 6. Stable right adnexal cystic lesion.   Electronically Signed   By: Genevive Bi M.D.   On: 06/01/2013 16:55   Nm Pet Image Restag (ps)  Skull Base To Thigh  06/21/2013   CLINICAL DATA:  Subsequent treatment strategy for lung cancer lung cancer with new solitary pulmonary,.  EXAM: NUCLEAR MEDICINE PET SKULL BASE TO THIGH  FASTING BLOOD GLUCOSE:  Value: 91mg /dl  TECHNIQUE: 16.1 mCi W-96 FDG was injected intravenously. CT data was obtained and used for attenuation correction and anatomic localization only. (This was not acquired as a diagnostic CT examination.) Additional exam technical data entered on technologist worksheet.  COMPARISON:  CT ABD/PELVIS W CM dated 06/01/2013  FINDINGS: NECK  No hypermetabolic lymph nodes in the neck.  CHEST  The right upper lobe pulmonary nodule of concern measuring 12 mm (image 78, series 2) does have associated metabolic activity with SUV max 3.8 consistent with neoplasm. The left lower lobe pulmonary nodule measuring 20 mm (image 91, series 2) does not have significant metabolic activity; however, there is intense metabolic activity and fullness in the left suprahilar region (image 88)with SUV max = 7.0. There is a focus of metabolic activity within the medial aspect the lingula which could represent postobstructive pneumonitis or malignancy (SUV max 6.7 image 69).  There is mild metabolic nodularity within the periphery of the right upper lobe (image 60) which is indeterminate. Metabolic activity within the thyroid gland is likely related to thyroiditis.  ABDOMEN/PELVIS  No abnormal hypermetabolic activity within the liver, pancreas, adrenal glands, or spleen. No hypermetabolic lymph nodes in the abdomen or pelvis. No metabolic activity associated with this cystic lesion the right adnexa. Scattered foci of uptake noted in the sigmoid colon likely related to diverticulosis.  SKELETON  No focal hypermetabolic activity to suggest skeletal metastasis.  IMPRESSION: 1. Hypermetabolic right upper lobe pulmonary nodule consistent with either primary bronchogenic carcinoma or metastasis. 2. New left suprahilar metabolic  tissue surrounding the upper lobe bronchus is most consistent with lung cancer recurrence. 3. Focus of hypermetabolic consolidation in the lingula either represents a neoplasm versus postobstructive pneumonitis. Favor the latter. 4. Metabolic activity associated with nodular pleural parenchymal thickening at the right lung apex is indeterminate and may be inflammatory. 5. The left lower lobe nodule is not significant hypermetabolic.   Electronically Signed   By: Genevive Bi M.D.   On: 06/21/2013 10:48   Dg Chest Port 1 View  06/26/2013   CLINICAL DATA:  Left-sided chest pain.  EXAM: PORTABLE CHEST - 1 VIEW  COMPARISON:  Chest CT, 06/01/2013 and chest radiographs, 05/12/2013.  FINDINGS: There is irregular fullness of the left hilum with adjacent coarse reticular opacity extends laterally and to the left lung base. There is also coarse irregular scarring in both  upper lobes. The lungs are hyperexpanded. Vague nodule is noted in the right upper lobe. No pleural effusion or pneumothorax.  Cardiac silhouette is normal in size. Mediastinum and right hilum are unremarkable.  IMPRESSION: No acute findings. No change from the prior chest radiograph allowing for differences in patient positioning.  Left hilar fullness and perihilar and left lung base scarring, stable. Other areas of lung scarring and the right upper lobe nodule are also stable.   Electronically Signed   By: Amie Portland M.D.   On: 06/26/2013 17:05    ASSESSMENT:  History of lung cancer, now with an enlarging right upper lobe nodule, likely due to progression of disease  PLAN:  #1 lung cancer I have reviewed all her scans. I suspect the right upper lobe nodule which is PET avid is likely due to either a second primary or metastatic disease from the first cancer detected on the left lower lobe. In any case, she has not changed her mind about systemic chemotherapy and she does not want to have anything to do with it. The patient also feel that  she is not ready to begin radiation therapy because she has not feeling well despite off treatment for some time. I have a long discussion with the patient about possibility of palliative care and hospice. She is very interested to see what palliative care can offer her. She is interested to focus mainly on feeling better and with better symptomatic management. #2 nausea Causes unknown. The patient had recurrent urinary tract infection. She was on corticosteroids for a while and gastric ulcer cannot be ruled out. Recommend continue antiemetics as needed. The patient is on Protonix. #3 history of recurrent UTI The patient is on antibiotics for this #4 shortness of breath This has resolved. Continue oxygen therapy #5 pleural effusion This is only small amount and does not need thoracentesis #6 COPD She is on oxygen therapy and inhalers #7 weakness Multiple imaging studies including MRI was negative for brain metastasis. She is currently receiving physical therapy #8 hypertensive urgency She is currently on multiple different blood pressure medications and her blood pressure is now under control #9 Mild hyponatremia #10 hypokalemia I will check serum cortisol level for tomorrow just to make sure this is not due to adrenal insufficiency.  All questions were answered. I will sign off.   Quincey Nored, MD @T @ 3:06 PM

## 2013-06-27 NOTE — Progress Notes (Signed)
  Echocardiogram 2D Echocardiogram has been performed.  Arvil Chaco 06/27/2013, 1:18 PM

## 2013-06-28 DIAGNOSIS — J910 Malignant pleural effusion: Secondary | ICD-10-CM

## 2013-06-28 LAB — BASIC METABOLIC PANEL
BUN: 9 mg/dL (ref 6–23)
CO2: 27 mEq/L (ref 19–32)
Calcium: 10.2 mg/dL (ref 8.4–10.5)
Creatinine, Ser: 1.01 mg/dL (ref 0.50–1.10)
GFR calc non Af Amer: 51 mL/min — ABNORMAL LOW (ref >90)
Glucose, Bld: 83 mg/dL (ref 70–99)
Sodium: 135 mEq/L (ref 135–145)

## 2013-06-28 LAB — CLOSTRIDIUM DIFFICILE BY PCR: Toxigenic C. Difficile by PCR: NEGATIVE

## 2013-06-28 LAB — TSH: TSH: 28.276 u[IU]/mL — ABNORMAL HIGH (ref 0.350–4.500)

## 2013-06-28 LAB — MAGNESIUM: Magnesium: 1.9 mg/dL (ref 1.5–2.5)

## 2013-06-28 MED ORDER — ESCITALOPRAM OXALATE 10 MG PO TABS
10.0000 mg | ORAL_TABLET | Freq: Every day | ORAL | Status: DC
  Administered 2013-06-28 – 2013-07-02 (×5): 10 mg via ORAL
  Filled 2013-06-28 (×7): qty 1

## 2013-06-28 MED ORDER — ONDANSETRON HCL 4 MG PO TABS
4.0000 mg | ORAL_TABLET | Freq: Four times a day (QID) | ORAL | Status: DC | PRN
  Filled 2013-06-28: qty 1

## 2013-06-28 MED ORDER — HYDROXYZINE HCL 25 MG PO TABS
25.0000 mg | ORAL_TABLET | Freq: Four times a day (QID) | ORAL | Status: DC | PRN
  Administered 2013-06-28 – 2013-06-30 (×4): 25 mg via ORAL
  Filled 2013-06-28 (×5): qty 1

## 2013-06-28 NOTE — Progress Notes (Signed)
PT Cancellation Note  Patient Details Name: Jodi Frazier MRN: [id number] DOB: 11-25-1930   Cancelled Treatment:    Reason Eval/Treat Not Completed: Other (comment) (Palliative Care consulted, awaiting goals of care)   [facility] 06/28/2013, 1:15 PM

## 2013-06-28 NOTE — Progress Notes (Signed)
[facility] PROGRESS NOTE  Jodi Frazier ZOX:[id number] DOB: Apr 22, 1931 DOA: 06/26/2013 PCP: Piedad Climes, PA-C  Assessment/Plan: Atypical chest pain  -likely related to patient's anxiety -Troponins negative  -EKG without any acute ischemic changes  -Continue aspirin daily  - CT angiogram chest negative for pulmonary embolus  -may need outpt stress test   -currently chest pain free Pleural effusion  -Suspect malignant effusion  -Increased in size compared to prior CT  -Currently stable without any distress  -appreciate Dr. Bertis Ruddy consult-->no chemo -pt undecided whether she wants to restart XRT  -daughter/pt request pulmonary eval-->I have placed consult today Hypertensive urgency  -Improved  -Continue HCTZ, amlodipine and carvedilol  Adenocarcinoma of lung  -06/21/2013 PET scan shows hypermetabolic RUL nodule with new left suprahilar metabolic tissue concerning for recurrence  -spoke with Dr. Bertis Ruddy for consult  -last XRT on 11/20/12  -MRI brain neg for mets  Goals of care -Appreciate palliative medicine evaluation and recommendations - Patient is DO NOT RESUSCITATE COPD  -Stable  -Continue BREO and SABA  Recent UTI  -recheck UA--no pyuria -finish 2 more days of cefuroxime  Hypokalemia  -Repleted -Check magnesium-1.9  Anxiety -Start Vistaril 25 mg every 6 hours when necessary anxiety -Increase Lexapro to 10 mg daily Chronic nausea -Likely related to the medications as well as acute medical illness -Also partly related to anxiety -Continue symptomatic treatment Deconditioning -PT evaluation -will need home health at a minimum Family Communication: daughter at beside  Disposition Plan: Home 06/29/13         Procedures/Studies: Ct Chest W Contrast  06/01/2013   CLINICAL DATA:  Adenocarcinoma along the radiation therapy complete.  EXAM: CT CHEST AND ABDOMEN WITH CONTRAST  TECHNIQUE: Multidetector CT imaging of the chest and abdomen was  performed following the standard protocol during bolus administration of intravenous contrast.  CONTRAST:  OMNIPAQUE IOHEXOL 300 MG/ML  SOLN  COMPARISON:  CT chest 04/24/2013, 02/22/2013  FINDINGS: CT CHEST FINDINGS  There is interval resolution of the left pneumothorax seen on prior. Nodule within the left lower lobe measures 16 x 19 mm compared to 15 x 18 mm on prior for no significant change. Peripheral linear nodularity in the lingula is similar. There is small left effusion which is unchanged.  In the right upper lobe, irregular nodule measures 11 mm compared to 8 mm on prior (image 17). There is biapical pleural parenchymal thickening posteriorly not changed from prior.  Small paratracheal lymph nodes are unchanged and less than 10 mm. No supraclavicular lymphadenopathy. No axillary adenopathy.  CT ABDOMEN FINDINGS  No focal hepatic lesion. The gallbladder, pancreas, spleen, adrenal glands, and kidneys are unchanged. There are simple cysts within the left and right kidney. There is a linear calcification in the right renal collecting system seen best on the coronal images. There is indeterminate on prior CT.  Stomach, small bowel and cecum are normal. Diverticulum of the descending colon.  No retroperitoneal periportal lymphadenopathy.  Within the pelvis, there is a rounded cystic lesion associated with the right adnexa measuring 3.6 cm is again noted. No aggressive osseous lesion. Right hip arthroplasty noted. (The pelvis was image by mistake and not charge to the patient).  IMPRESSION: 1. Left upper lobe nodule is stable compared to prior. 2. Right upper lobe nodule is increased in size and concerning for bronchogenic carcinoma. 3. Stable linear thickening in the  left upper lobe. 4. Linear density in the right renal collecting system likely represents a calculus. Bilateral nonenhancing renal cysts. 5.  No evidence metastasis below diaphragms. 6. Stable right adnexal cystic lesion.   Electronically  Signed   By: Genevive Bi M.D.   On: 06/01/2013 16:55   Ct Angio Chest Pe W/cm &/or Wo Cm  06/26/2013   CLINICAL DATA:  History of adenocarcinoma of the lung treated with radiation therapy. Left-sided chest pain.  EXAM: CT ANGIOGRAPHY CHEST WITH CONTRAST  TECHNIQUE: Multidetector CT imaging of the chest was performed using the standard protocol during bolus administration of intravenous contrast. Multiplanar CT image reconstructions including MIPs were obtained to evaluate the vascular anatomy.  CONTRAST:  OMNIPAQUE IOHEXOL 350 MG/ML SOLN  COMPARISON:  Chest CT, 06/01/2013  FINDINGS: No evidence of a pulmonary embolus.  Moderate pleural effusion on the left has increased from the prior study. There is an 18 mm by 16 mm mass posterior to the inferior left hilum, without change. Abnormal soft tissue surrounds the left hilar structures, also stable. There are additional areas of coarse reticular opacity in the left lung which are also stable, likely scarring. A few tiny reticular nodular densities are noted posteriorly in the left lower lobe and left upper lobe.  The 11 mm mass in the right upper lobe, posterior laterally, stable. There are other areas of reticular opacity there are mostly stable. There are subtle tree in bud type opacities in the posterior right lower lobe, which are new. There are no focal areas of consolidation. Posterior and apical upper lobe scarring is stable. No right pleural effusion.  The heart is normal in size. The great vessels are normal in caliber. The largest mediastinal node is a 10.7 cm precarinal lymph node. This is stable as are the remaining sub cm nodes. No right hilar mass or adenopathy.  Limited evaluation of the upper abdomen shows a partly imaged left renal low-density lesion likely a cyst. It is stable. No adrenal masses.  The bones are demineralized. There are no osteoblastic or osteolytic lesions.  Review of the MIP images confirms the above findings.   IMPRESSION: 1. No evidence of a pulmonary embolism. 2. Changes from treatment of left lung carcinoma are without significant change. There is abnormal soft tissue with associated coarse reticular opacity centered on the left hilum extending into the left lung, which is all stable consistent with post treatment related scarring. There is also a discrete mass in the left lower lobe posterior to the left hilum that is stable. 3. Small mass in the right upper lobe is stable. No new lung masses. 4. The left pleural effusion has increased, now moderate size. 5. There are few subtle tree in bud type opacities in the right lower lobe which are new. This is nonspecific. It may reflect a small area of bronchiolitis. No other change from the prior study.   Electronically Signed   By: Amie Portland M.D.   On: 06/26/2013 18:38   Mr Laqueta Jean JY Contrast  06/11/2013   CLINICAL DATA:  Adenocarcinoma of lung. Nausea. Rule out metastatic disease.  EXAM: MRI HEAD WITHOUT AND WITH CONTRAST  TECHNIQUE: Multiplanar, multiecho pulse sequences of the brain and surrounding structures were obtained without and with intravenous contrast.  CONTRAST:  17mL MULTIHANCE GADOBENATE DIMEGLUMINE 529 MG/ML IV SOLN  COMPARISON:  MRI head 09/07/2012  FINDINGS: Generalized atrophy. Minimal chronic microvascular ischemic type changes in the white matter. Negative for acute infarct.  Negative for hemorrhage or mass.  No edema or midline shift.  Normal enhancement following contrast administration.  Paranasal sinuses are clear.  IMPRESSION:  Negative for metastatic disease to the brain. No acute abnormality. No change from prior MRI.   Electronically Signed   By: Marlan Palau M.D.   On: 06/11/2013 15:47   Ct Abdomen Pelvis W Contrast  06/01/2013   CLINICAL DATA:  Adenocarcinoma along the radiation therapy complete.  EXAM: CT CHEST AND ABDOMEN WITH CONTRAST  TECHNIQUE: Multidetector CT imaging of the chest and abdomen was performed following the  standard protocol during bolus administration of intravenous contrast.  CONTRAST:  OMNIPAQUE IOHEXOL 300 MG/ML  SOLN  COMPARISON:  CT chest 04/24/2013, 02/22/2013  FINDINGS: CT CHEST FINDINGS  There is interval resolution of the left pneumothorax seen on prior. Nodule within the left lower lobe measures 16 x 19 mm compared to 15 x 18 mm on prior for no significant change. Peripheral linear nodularity in the lingula is similar. There is small left effusion which is unchanged.  In the right upper lobe, irregular nodule measures 11 mm compared to 8 mm on prior (image 17). There is biapical pleural parenchymal thickening posteriorly not changed from prior.  Small paratracheal lymph nodes are unchanged and less than 10 mm. No supraclavicular lymphadenopathy. No axillary adenopathy.  CT ABDOMEN FINDINGS  No focal hepatic lesion. The gallbladder, pancreas, spleen, adrenal glands, and kidneys are unchanged. There are simple cysts within the left and right kidney. There is a linear calcification in the right renal collecting system seen best on the coronal images. There is indeterminate on prior CT.  Stomach, small bowel and cecum are normal. Diverticulum of the descending colon.  No retroperitoneal periportal lymphadenopathy.  Within the pelvis, there is a rounded cystic lesion associated with the right adnexa measuring 3.6 cm is again noted. No aggressive osseous lesion. Right hip arthroplasty noted. (The pelvis was image by mistake and not charge to the patient).  IMPRESSION: 1. Left upper lobe nodule is stable compared to prior. 2. Right upper lobe nodule is increased in size and concerning for bronchogenic carcinoma. 3. Stable linear thickening in the  left upper lobe. 4. Linear density in the right renal collecting system likely represents a calculus. Bilateral nonenhancing renal cysts. 5. No evidence metastasis below diaphragms. 6. Stable right adnexal cystic lesion.   Electronically Signed   By: Genevive Bi M.D.   On: 06/01/2013 16:55   Nm Pet Image Restag (ps) Skull Base To Thigh  06/21/2013   CLINICAL DATA:  Subsequent treatment strategy for lung cancer lung cancer with new solitary pulmonary,.  EXAM: NUCLEAR MEDICINE PET SKULL BASE TO THIGH  FASTING BLOOD GLUCOSE:  Value: 91mg /dl  TECHNIQUE: 16.1 mCi W-96 FDG was injected intravenously. CT data was obtained and used for attenuation correction and anatomic localization only. (This was not acquired as a diagnostic CT examination.) Additional exam technical data entered on technologist worksheet.  COMPARISON:  CT ABD/PELVIS W CM dated 06/01/2013  FINDINGS: NECK  No hypermetabolic lymph nodes in the neck.  CHEST  The right upper lobe pulmonary nodule of concern measuring 12 mm (image 78, series 2) does have associated metabolic activity with SUV max 3.8 consistent with neoplasm. The left lower lobe pulmonary nodule measuring 20 mm (image 91, series 2) does not have significant metabolic activity; however, there is intense metabolic activity and fullness in the left suprahilar region (image 88)with SUV max = 7.0. There is a focus of metabolic activity within the medial aspect the lingula which could represent postobstructive pneumonitis or malignancy (SUV max 6.7 image 69).  There is mild metabolic nodularity  within the periphery of the right upper lobe (image 60) which is indeterminate. Metabolic activity within the thyroid gland is likely related to thyroiditis.  ABDOMEN/PELVIS  No abnormal hypermetabolic activity within the liver, pancreas, adrenal glands, or spleen. No hypermetabolic lymph nodes in the abdomen or pelvis. No metabolic activity associated with this cystic lesion the right adnexa. Scattered foci of uptake noted in the sigmoid colon likely related to diverticulosis.  SKELETON  No focal hypermetabolic activity to suggest skeletal metastasis.  IMPRESSION: 1. Hypermetabolic right upper lobe pulmonary nodule consistent with either primary  bronchogenic carcinoma or metastasis. 2. New left suprahilar metabolic tissue surrounding the upper lobe bronchus is most consistent with lung cancer recurrence. 3. Focus of hypermetabolic consolidation in the lingula either represents a neoplasm versus postobstructive pneumonitis. Favor the latter. 4. Metabolic activity associated with nodular pleural parenchymal thickening at the right lung apex is indeterminate and may be inflammatory. 5. The left lower lobe nodule is not significant hypermetabolic.   Electronically Signed   By: Genevive Bi M.D.   On: 06/21/2013 10:48   Dg Chest Port 1 View  06/26/2013   CLINICAL DATA:  Left-sided chest pain.  EXAM: PORTABLE CHEST - 1 VIEW  COMPARISON:  Chest CT, 06/01/2013 and chest radiographs, 05/12/2013.  FINDINGS: There is irregular fullness of the left hilum with adjacent coarse reticular opacity extends laterally and to the left lung base. There is also coarse irregular scarring in both upper lobes. The lungs are hyperexpanded. Vague nodule is noted in the right upper lobe. No pleural effusion or pneumothorax.  Cardiac silhouette is normal in size. Mediastinum and right hilum are unremarkable.  IMPRESSION: No acute findings. No change from the prior chest radiograph allowing for differences in patient positioning.  Left hilar fullness and perihilar and left lung base scarring, stable. Other areas of lung scarring and the right upper lobe nodule are also stable.   Electronically Signed   By: Amie Portland M.D.   On: 06/26/2013 17:05         Subjective: Patient denies any fevers, chills, chest pain, vomiting, diarrhea, abdominal pain, dysuria. She continues to have some nausea. No headache or dizziness. She feels anxious today.  Objective: Filed Vitals:   06/27/13 0500 06/27/13 1330 06/27/13 2005 06/28/13 0620  BP: 133/63 131/68 123/61 154/60  Pulse: 68 61 72 65  Temp: 97.6 F (36.4 C) 97.8 F (36.6 C) 97.5 F (36.4 C) 97.5 F (36.4 C)  TempSrc:  Oral Oral Oral Oral  Resp: 22 16 20 18   Height:      Weight:      SpO2: 100% 98% 97% 94%    Intake/Output Summary (Last 24 hours) at 06/28/13 1013 Last data filed at 06/28/13 0820  Gross per 24 hour  Intake    240 ml  Output    950 ml  Net   -710 ml   Weight change:  Exam:   General:  Pt is alert, follows commands appropriately, not in acute distress  HEENT: No icterus, No thrush,  [place]/AT  Cardiovascular: RRR, S1/S2, no rubs, no gallops  Respiratory: Diminished breath sounds left base. Right foot auscultation. No wheezing.  Abdomen: Soft/+BS, non tender, non distended, no guarding  Extremities: trace LE edema, No lymphangitis, No petechiae, No rashes, no synovitis  Data Reviewed: Basic Metabolic Panel:  Recent Labs Lab 06/26/13 1632 06/27/13 0418 06/28/13 0419  NA 136 133* 135  K 4.3 3.2* 3.9  CL 101 97 99  CO2 23 23 27  GLUCOSE 101* 113* 83  BUN 8 7 9   CREATININE 0.72 0.76 1.01  CALCIUM 10.6* 10.0 10.2  MG  --   --  1.9   Liver Function Tests: No results found for this basename: AST, ALT, ALKPHOS, BILITOT, PROT, ALBUMIN,  in the last 168 hours No results found for this basename: LIPASE, AMYLASE,  in the last 168 hours No results found for this basename: AMMONIA,  in the last 168 hours CBC:  Recent Labs Lab 06/26/13 1632 06/27/13 0418  WBC 8.4 6.6  HGB 14.1 12.4  HCT 42.5 38.5  MCV 89.3 89.5  PLT 452* 429*   Cardiac Enzymes:  Recent Labs Lab 06/26/13 1632 06/26/13 2128 06/27/13 0418 06/27/13 1035  TROPONINI <0.30 <0.30 <0.30 <0.30   BNP: No components found with this basename: POCBNP,  CBG: No results found for this basename: GLUCAP,  in the last 168 hours  No results found for this or any previous visit (from the past 240 hour(s)).   Scheduled Meds: . amitriptyline  25 mg Oral QHS  . amLODipine  2.5 mg Oral Daily  . aspirin  81 mg Oral QPM  . carvedilol  6.25 mg Oral BID WC  . cefUROXime  250 mg Oral BID WC  . cycloSPORINE  1 drop  Both Eyes BID  . donepezil  15 mg Oral QHS  . escitalopram  10 mg Oral Daily  . heparin  5,000 Units Subcutaneous Q8H  . hydrochlorothiazide  12.5 mg Oral Daily  . levothyroxine  125 mcg Oral QAC breakfast  . mesalamine  1,200 mg Oral Q breakfast  . pantoprazole  40 mg Oral q morning - 10a  . sodium chloride  3 mL Intravenous Q12H   Continuous Infusions:    Shavar Gorka, DO  [facility] Pager [phone]  If 7PM-7AM, please contact night-coverage [email or web address] Password [email or web address] 06/28/2013, 10:13 AM   LOS: 2 days

## 2013-06-28 NOTE — Care Management Note (Signed)
UR complete    Bradley Handyside,MSN,RN [phone] 

## 2013-06-28 NOTE — Consult Note (Signed)
Patient [name]      DOB: Sep 12, 1930      GNF:[id number]     Consult Note from the Palliative Medicine Team at [facility]    Consult Requested by: Dr Tat     PCP: Piedad Climes, PA-C Reason for Consultation: Clarification of GOC and options     Phone Number:[phone]  Assessment of patients Current state:  Jodi Frazier is a 79 y.o. female who presents to the ED with chest pain. Pain has been episodic, ongoing for the past 2 weeks and worsening. Occurs in the context of CAD with prior MI and a growing lung cancer. Patient also reports increasing difficulty with BP control at home. CTA negative for PE  Initial trop negative.  Patient is faced with decision regarding lung cancer treatment as it affects her overall quality of life.  Her main complaints today are related to anxiety and nausea.   Consult is for review of medical treatment options, clarification of goals of care and end of life issues, disposition and options, and symptom recommendation.  This NP Lorinda Creed reviewed medical records, received report from team, assessed the patient and then meet at the patient's bedside along with her daughter Marquita Palms # [phone] and her daughter Lodema Pilot [phone] by telephone  to discuss diagnosis prognosis, GOC, EOL wishes disposition and options.   A detailed discussion was had today regarding advanced directives.  Concepts specific to code status, artifical feeding and hydration, continued IV antibiotics and rehospitalization was had.  The difference between a aggressive medical intervention path  and a palliative comfort care path for this patient at this time was had.  Values and goals of care important to patient and family were attempted to be elicited.  Concept of Hospice and Palliative Care were discussed  Natural trajectory and expectations at EOL were discussed.  Questions and concerns addressed. Family encouraged to call with questions or  concerns.  PMT will continue to support holistically.       Goals of Care: 1.  Code Status:DNR/DNI   2. Scope of Treatment: 1. Vital Signs:per unit  2. Respiratory/Oxygen:as needed for treatmetn 3. Nutritional Support/Tube Feeds:no artificial feeding now or in the future 4. Antibiotics:yes 5. Blood Products:yes 6. IVF:yes 7. Review of Medications to be discontinued:continue with present medications and make adjustments to address symptom management 8. Labs: as needed 9. Telemetry:dc 10. Consults: family is requesting consult to Pulmonary    4. Disposition:  Dependant on outcomes, SNF for rehab vs home with HH   3. Symptom Management:   1. Anxiety: increase Lexapro to 19 mg PO daily 2. Nausea/:  Vistaril 25 mg every 6 hrs prn (this may also help with anxiety symptoms) 3. Weakness: PT/OT evaluation and TX  4. Psychosocial: emotional support to all.  Patient herself verbalizes understanding of her diagnosis and is leaning toward a more comfort approach, however daughters are hopeful for improvement with the possibility  of more radiation  5. Spiritual:  Strong community church support and open to hospital chaplain.       Brief HPI: [name] is a 79 y.o. female who presents to the ED with chest pain. Pain has been episodic, ongoing for the past 2 weeks and worsening. Occurs in the context of CAD with prior MI and a growing lung cancer. Patient also reports increasing difficulty with BP control at home. CTA negative for PE  Initial trop negative.  Patient is faced with decision regarding lung cancer treatment  as it affects her overall quality of life.  Her main complaints today are related to anxiety and nausea.   ROS: weakness, increased SOB on exertion    PMH:  Past Medical History  Diagnosis Date  . Hypertension   . Colitis   . Coronary artery disease   . High cholesterol     takes Red Yeast Rice and Fish OIl daily  . Kidney stones   . PONV  (postoperative nausea and vomiting)   . MI (myocardial infarction) 2010  . Shortness of breath     with exertion  . Pneumonia     hx of 20+yrs ago  . COPD (chronic obstructive pulmonary disease)     pleurisy or COPD exascerbation > 24yr ago  . Headache(784.0)   . Vertigo     HTN related bc gets up too fast  . Arthritis   . Bruises easily   . History of shingles 19yrs ago    in eye;occasionally gets some residual   . Accessory skin tags     arms/legs  . GERD (gastroesophageal reflux disease)   . Colitis   . Diverticulosis   . Urinary frequency   . Stress incontinence   . History of kidney stones   . Hypothyroidism     takes sYnthroid daily  . Cataract     early stage on right  . Macular degeneration, wet   . Depression     some but takes Amitryptylline nightly  . Right-sided chest wall pain 08/28/12    PRESENTATION - RIGHT SIDED ANTERIOR CHEST PAIN  . Weight loss 08/28/12    10 LB WEIGHT LOSS OVER 3 MONTHS  . Lung mass 08/12/12    CHEST-XRAY/ PET -LOBULAR MASS LLL - 4.1 X 3.6 X 4.2 CM  . Dementia     mild  . S/P radiation therapy  10/05/2012-11/20/2012    Left Lower Lung and hilum / 70 Gy in 35 fractions  . Adenocarcinoma of lung 09/07/12    needle core bx-LLL-adenocarcinoma  . Hypertension      PSH: Past Surgical History  Procedure Laterality Date  . Hip arthroscopy  31yr ago    right hip-replacement  . Knee arthroscopy      LT  . Total knee arthroplasty  21yrs ago    left  . Appendectomy  79yrs ago  . Abdominal hysterectomy  29yrs ago   . Lithotripsy  2011  . Cataract surgery  2012    left  . Eye surgery  80yrs ago  . Bladder surgery  2001    tacked  . Coronary angioplasty with stent placement  2010    2 in rt coronary artery and 1 in another spot  . Colonoscopy    . Esophagogastroduodenoscopy    . Total knee arthroplasty  09/09/2011    Procedure: TOTAL KNEE ARTHROPLASTY;  Surgeon: Raymon Mutton, MD;  Location: [facility] OR;  Service: Orthopedics;  Laterality:  Right;  Right Total Knee Arthroplasty  . Bronchial brush biopsy Left 08/17/12    LLL Bronchial Washing / Brushing and Bronchial Biopsy: Negative for malignancy  . Left lower lobe needle core biopsy Left 09/07/12    Poorly Differentiated Adenocarcinoma   . Joint replacement     I have reviewed the FH and SH and  If appropriate update it with new information. Allergies  Allergen Reactions  . Codeine Hives  . Augmentin [Amoxicillin-Pot Clavulanate] Diarrhea and Nausea And Vomiting   Scheduled Meds: . amitriptyline  25 mg Oral QHS  .  amLODipine  2.5 mg Oral Daily  . aspirin  81 mg Oral QPM  . carvedilol  6.25 mg Oral BID WC  . cefUROXime  250 mg Oral BID WC  . cycloSPORINE  1 drop Both Eyes BID  . donepezil  15 mg Oral QHS  . escitalopram  10 mg Oral Daily  . heparin  5,000 Units Subcutaneous Q8H  . hydrochlorothiazide  12.5 mg Oral Daily  . levothyroxine  125 mcg Oral QAC breakfast  . mesalamine  1,200 mg Oral Q breakfast  . pantoprazole  40 mg Oral q morning - 10a  . sodium chloride  3 mL Intravenous Q12H   Continuous Infusions:  PRN Meds:.albuterol, hydrALAZINE, HYDROcodone-acetaminophen, hydrOXYzine, naproxen sodium, ondansetron    BP 154/60  Pulse 65  Temp(Src) 97.5 F (36.4 C) (Oral)  Resp 18  Ht 5\' 11"  (1.803 m)  Wt 80.7 kg (177 lb 14.6 oz)  BMI 24.82 kg/m2  SpO2 94%   PPS:30 %   Intake/Output Summary (Last 24 hours) at 06/28/13 0944 Last data filed at 06/28/13 0820  Gross per 24 hour  Intake    240 ml  Output    950 ml  Net   -710 ml   LBM: 06-26-13 per pateint                        Physical Exam:  General: chronically ill appearing, NAD HEENT:  Mm, no exudate noted CVS: RRR Skin: warm and dry Ext: without edema Neuro:  Slow to retrieve answers to simple questions, she is aware and anxious of mental status change  Labs: CBC    Component Value Date/Time   WBC 6.6 06/27/2013 0418   RBC 4.30 06/27/2013 0418   HGB 12.4 06/27/2013 0418   HCT 38.5  06/27/2013 0418   PLT 429* 06/27/2013 0418   MCV 89.5 06/27/2013 0418   MCH 28.8 06/27/2013 0418   MCHC 32.2 06/27/2013 0418   RDW 15.5 06/27/2013 0418   LYMPHSABS 1.1 06/07/2013 1408   MONOABS 0.9 06/07/2013 1408   EOSABS 0.2 06/07/2013 1408   BASOSABS 0.1 06/07/2013 1408    BMET    Component Value Date/Time   NA 135 06/28/2013 0419   K 3.9 06/28/2013 0419   CL 99 06/28/2013 0419   CO2 27 06/28/2013 0419   GLUCOSE 83 06/28/2013 0419   BUN 9 06/28/2013 0419   BUN 13.5 06/10/2013 1603   CREATININE 1.01 06/28/2013 0419   CREATININE 1.0 06/10/2013 1603   CREATININE 0.90 06/07/2013 1408   CALCIUM 10.2 06/28/2013 0419   GFRNONAA 51* 06/28/2013 0419   GFRAA 59* 06/28/2013 0419    CMP     Component Value Date/Time   NA 135 06/28/2013 0419   K 3.9 06/28/2013 0419   CL 99 06/28/2013 0419   CO2 27 06/28/2013 0419   GLUCOSE 83 06/28/2013 0419   BUN 9 06/28/2013 0419   BUN 13.5 06/10/2013 1603   CREATININE 1.01 06/28/2013 0419   CREATININE 1.0 06/10/2013 1603   CREATININE 0.90 06/07/2013 1408   CALCIUM 10.2 06/28/2013 0419   PROT 6.2 06/07/2013 1408   ALBUMIN 3.5 06/07/2013 1408   AST 18 06/07/2013 1408   ALT 13 06/07/2013 1408   ALKPHOS 146* 06/07/2013 1408   BILITOT 0.5 06/07/2013 1408   GFRNONAA 51* 06/28/2013 0419   GFRAA 59* 06/28/2013 0419     Time In Time Out Total Time Spent with Patient Total Overall Time  0845 1015 80 min  90 min    Greater than 50%  of this time was spent counseling and coordinating care related to the above assessment and plan.  Lorinda Creed NP  Palliative Medicine Team Team Phone # [phone] Pager [phone]  Discussed with Dr Tat

## 2013-06-28 NOTE — Consult Note (Signed)
Name: Jodi Frazier MRN: [id number] DOB: 05/02/1931    ADMISSION DATE:  06/26/2013 CONSULTATION DATE:  06/28/2013  REFERRING MD :  Catarina Hartshorn  CHIEF COMPLAINT:  Chest pain  BRIEF PATIENT DESCRIPTION:  79 yo female presented with chest pain, and noted to have elevated BP (221/96) in ER.  She has hx of adenocarcinoma of lung s/p XRT March to [id number], and COPD.  She is followed by Dr. Delton Coombes in pulmonary office.  She was found to have progressive pleural effusion and PCCM consulted to assist with assessment.  SIGNIFICANT EVENTS: 12/17 Admit 12/28 Oncology consulted, Palliative care consulted  STUDIES:  08/14/12 Spirometry >> FEV1 1.4 (52%), FEV1% 65 06/21/13 PET scan >> hypermetabolic RUL nodule, Lt suprahilar hypermetabolic activity, focus of hypermetabolic activity in lingula 06/26/13 CT chest >> no PE, mod Lt pleural effusion, 18 mm noduleLt hilum, 11 mm nodule RUL 06/27/13 Echo >> EF 60 to 65%, grade 1 diastolic dysfx  LINES / TUBES: PIV   CULTURES: 12/29 C diff PCR >>   ANTIBIOTICS: 12/28 cefuroxime >>   HISTORY OF PRESENT ILLNESS:   79 yo female with hx of NSCLC s/p XRT and COPD presented with dyspnea, and chest pain.  She was found to have elevated BP.  With tx for elevated BP her dyspnea and chest pain have improved.  She denies sputum, hemoptysis, fever, or wheeze.  She had recent PET scan which showed above findings.  She also has progressive Lt pleural effusion.  She was treated for a Lt PTX in October 2014.  PAST MEDICAL HISTORY :  Past Medical History  Diagnosis Date  . Hypertension   . Colitis   . Coronary artery disease   . High cholesterol     takes Red Yeast Rice and Fish OIl daily  . Kidney stones   . PONV (postoperative nausea and vomiting)   . MI (myocardial infarction) 2010  . Shortness of breath     with exertion  . Pneumonia     hx of 20+yrs ago  . COPD (chronic obstructive pulmonary disease)     pleurisy or COPD exascerbation > 62yr ago    . Headache(784.0)   . Vertigo     HTN related bc gets up too fast  . Arthritis   . Bruises easily   . History of shingles 51yrs ago    in eye;occasionally gets some residual   . Accessory skin tags     arms/legs  . GERD (gastroesophageal reflux disease)   . Colitis   . Diverticulosis   . Urinary frequency   . Stress incontinence   . History of kidney stones   . Hypothyroidism     takes sYnthroid daily  . Cataract     early stage on right  . Macular degeneration, wet   . Depression     some but takes Amitryptylline nightly  . Right-sided chest wall pain 08/28/12    PRESENTATION - RIGHT SIDED ANTERIOR CHEST PAIN  . Weight loss 08/28/12    10 LB WEIGHT LOSS OVER 3 MONTHS  . Lung mass 08/12/12    CHEST-XRAY/ PET -LOBULAR MASS LLL - 4.1 X 3.6 X 4.2 CM  . Dementia     mild  . S/P radiation therapy  10/05/2012-11/20/2012    Left Lower Lung and hilum / 70 Gy in 35 fractions  . Adenocarcinoma of lung 09/07/12    needle core bx-LLL-adenocarcinoma  . Hypertension    Past Surgical History  Procedure Laterality Date  .  Hip arthroscopy  63yr ago    right hip-replacement  . Knee arthroscopy      LT  . Total knee arthroplasty  43yrs ago    left  . Appendectomy  68yrs ago  . Abdominal hysterectomy  59yrs ago   . Lithotripsy  2011  . Cataract surgery  2012    left  . Eye surgery  51yrs ago  . Bladder surgery  2001    tacked  . Coronary angioplasty with stent placement  2010    2 in rt coronary artery and 1 in another spot  . Colonoscopy    . Esophagogastroduodenoscopy    . Total knee arthroplasty  09/09/2011    Procedure: TOTAL KNEE ARTHROPLASTY;  Surgeon: Raymon Mutton, MD;  Location: [facility] OR;  Service: Orthopedics;  Laterality: Right;  Right Total Knee Arthroplasty  . Bronchial brush biopsy Left 08/17/12    LLL Bronchial Washing / Brushing and Bronchial Biopsy: Negative for malignancy  . Left lower lobe needle core biopsy Left 09/07/12    Poorly Differentiated Adenocarcinoma   .  Joint replacement     Prior to Admission medications   Medication Sig Start Date End Date Taking? Authorizing Provider  albuterol (PROVENTIL HFA;VENTOLIN HFA) 108 (90 BASE) MCG/ACT inhaler Inhale 2 puffs into the lungs every 6 (six) hours as needed for wheezing. 02/08/13  Yes Lonia Farber, MD  amitriptyline (ELAVIL) 25 MG tablet Take 25 mg by mouth at bedtime.   Yes Historical Provider, MD  amLODipine (NORVASC) 2.5 MG tablet Take 2.5 mg by mouth daily.   Yes Historical Provider, MD  aspirin 81 MG tablet Take 81 mg by mouth every evening.    Yes Historical Provider, MD  carvedilol (COREG) 6.25 MG tablet Take 6.25 mg by mouth 2 (two) times daily with a meal.    Yes Historical Provider, MD  cefUROXime (CEFTIN) 250 MG tablet Take 1 tablet (250 mg total) by mouth 2 (two) times daily with a meal. 06/18/13  Yes Piedad Climes, PA-C  Cyanocobalamin (VITAMIN B-12 IJ) Inject 1,000 mcg/mL as directed every 30 (thirty) days.   Yes Historical Provider, MD  cycloSPORINE (RESTASIS) 0.05 % ophthalmic emulsion Place 1 drop into both eyes 2 (two) times daily.    Yes Historical Provider, MD  donepezil (ARICEPT) 10 MG tablet Take 15 mg by mouth at bedtime. Takes 1 and 1/2   Yes Historical Provider, MD  escitalopram (LEXAPRO) 5 MG tablet Take 1 tablet (5 mg total) by mouth daily. 06/07/13  Yes Piedad Climes, PA-C  Fluticasone Furoate-Vilanterol (BREO ELLIPTA) 100-25 MCG/INH AEPB Inhale 100 mcg into the lungs daily. 05/04/13  Yes Leslye Peer, MD  levothyroxine (SYNTHROID) 125 MCG tablet Take 1 tablet (125 mcg total) by mouth daily before breakfast. 05/17/13  Yes Piedad Climes, PA-C  mesalamine (LIALDA) 1.2 G EC tablet Take 1,200 mg by mouth daily with breakfast.    Yes Historical Provider, MD  naproxen sodium (ANAPROX) 220 MG tablet Take 220 mg by mouth 2 (two) times daily as needed (pain).   Yes Historical Provider, MD  ondansetron (ZOFRAN) 4 MG tablet Take 1 tablet (4 mg total) by mouth  every 8 (eight) hours as needed for nausea or vomiting. 06/07/13  Yes Piedad Climes, PA-C  pantoprazole (PROTONIX) 40 MG tablet Take 40 mg by mouth every morning.    Yes Historical Provider, MD  nitroGLYCERIN (NITROSTAT) 0.4 MG SL tablet Place 0.4 mg under the tongue every 5 (five) minutes as needed for  chest pain.     Historical Provider, MD   Allergies  Allergen Reactions  . Codeine Hives  . Augmentin [Amoxicillin-Pot Clavulanate] Diarrhea and Nausea And Vomiting    FAMILY HISTORY:  Family History  Problem Relation Age of Onset  . Anesthesia problems Mother   . Hypotension Neg Hx   . Malignant hyperthermia Neg Hx   . Pseudochol deficiency Neg Hx   . Cancer Maternal Aunt     breast   SOCIAL HISTORY:  reports that she quit smoking about 10 years ago. Her smoking use included Cigarettes. She has a 40 pack-year smoking history. She has never used smokeless tobacco. She reports that she drinks alcohol. She reports that she does not use illicit drugs.  REVIEW OF SYSTEMS:   Negative except above.  SUBJECTIVE:   VITAL SIGNS: Temp:  [97.5 F (36.4 C)-97.8 F (36.6 C)] 97.5 F (36.4 C) (12/29 0620) Pulse Rate:  [61-72] 65 (12/29 0620) Resp:  [16-20] 18 (12/29 0620) BP: (123-154)/(60-68) 154/60 mmHg (12/29 0620) SpO2:  [94 %-98 %] 94 % (12/29 0620)  PHYSICAL EXAMINATION: General: Pleasant Neuro:  Normal strength, CN intact HEENT:  Pupils reactive, no sinus tenderness, no oral exudate Cardiovascular:  Regular, no murmur Lungs:  Decreased BS Lt base, no wheeze Abdomen:  Soft, non tender Musculoskeletal:  No edema Skin:  No rashes  Labs: CBC Recent Labs     06/26/13  1632  06/27/13  0418  WBC  8.4  6.6  HGB  14.1  12.4  HCT  42.5  38.5  PLT  452*  429*   BMET Recent Labs     06/26/13  1632  06/27/13  0418  06/28/13  0419  NA  136  133*  135  K  4.3  3.2*  3.9  CL  101  97  99  CO2  23  23  27   BUN  8  7  9   CREATININE  0.72  0.76  1.01  GLUCOSE  101*   113*  83    Electrolytes Recent Labs     06/26/13  1632  06/27/13  0418  06/28/13  0419  CALCIUM  10.6*  10.0  10.2  MG   --    --   1.9   Cardiac Enzymes Recent Labs     06/26/13  1632  06/26/13  2128  06/27/13  0418  06/27/13  1035  TROPONINI  <0.30  <0.30  <0.30  <0.30  PROBNP  155.8   --    --    --    Imaging Ct Angio Chest Pe W/cm &/or Wo Cm  06/26/2013   CLINICAL DATA:  History of adenocarcinoma of the lung treated with radiation therapy. Left-sided chest pain.  EXAM: CT ANGIOGRAPHY CHEST WITH CONTRAST  TECHNIQUE: Multidetector CT imaging of the chest was performed using the standard protocol during bolus administration of intravenous contrast. Multiplanar CT image reconstructions including MIPs were obtained to evaluate the vascular anatomy.  CONTRAST:  OMNIPAQUE IOHEXOL 350 MG/ML SOLN  COMPARISON:  Chest CT, 06/01/2013  FINDINGS: No evidence of a pulmonary embolus.  Moderate pleural effusion on the left has increased from the prior study. There is an 18 mm by 16 mm mass posterior to the inferior left hilum, without change. Abnormal soft tissue surrounds the left hilar structures, also stable. There are additional areas of coarse reticular opacity in the left lung which are also stable, likely scarring. A few tiny reticular nodular densities are noted  posteriorly in the left lower lobe and left upper lobe.  The 11 mm mass in the right upper lobe, posterior laterally, stable. There are other areas of reticular opacity there are mostly stable. There are subtle tree in bud type opacities in the posterior right lower lobe, which are new. There are no focal areas of consolidation. Posterior and apical upper lobe scarring is stable. No right pleural effusion.  The heart is normal in size. The great vessels are normal in caliber. The largest mediastinal node is a 10.7 cm precarinal lymph node. This is stable as are the remaining sub cm nodes. No right hilar mass or adenopathy.   Limited evaluation of the upper abdomen shows a partly imaged left renal low-density lesion likely a cyst. It is stable. No adrenal masses.  The bones are demineralized. There are no osteoblastic or osteolytic lesions.  Review of the MIP images confirms the above findings.  IMPRESSION: 1. No evidence of a pulmonary embolism. 2. Changes from treatment of left lung carcinoma are without significant change. There is abnormal soft tissue with associated coarse reticular opacity centered on the left hilum extending into the left lung, which is all stable consistent with post treatment related scarring. There is also a discrete mass in the left lower lobe posterior to the left hilum that is stable. 3. Small mass in the right upper lobe is stable. No new lung masses. 4. The left pleural effusion has increased, now moderate size. 5. There are few subtle tree in bud type opacities in the right lower lobe which are new. This is nonspecific. It may reflect a small area of bronchiolitis. No other change from the prior study.   Electronically Signed   By: Amie Portland M.D.   On: 06/26/2013 18:38   Dg Chest Port 1 View  06/26/2013   CLINICAL DATA:  Left-sided chest pain.  EXAM: PORTABLE CHEST - 1 VIEW  COMPARISON:  Chest CT, 06/01/2013 and chest radiographs, 05/12/2013.  FINDINGS: There is irregular fullness of the left hilum with adjacent coarse reticular opacity extends laterally and to the left lung base. There is also coarse irregular scarring in both upper lobes. The lungs are hyperexpanded. Vague nodule is noted in the right upper lobe. No pleural effusion or pneumothorax.  Cardiac silhouette is normal in size. Mediastinum and right hilum are unremarkable.  IMPRESSION: No acute findings. No change from the prior chest radiograph allowing for differences in patient positioning.  Left hilar fullness and perihilar and left lung base scarring, stable. Other areas of lung scarring and the right upper lobe nodule are also  stable.   Electronically Signed   By: Amie Portland M.D.   On: 06/26/2013 17:05    ASSESSMENT / PLAN:  A: Chest pain, dyspnea >> likely related to HTN urgency >> improved. P: -per primary team  A: COPD >> stable. P: -continue LABA/ICS combination with prn SABA  A: Hx of NSCLC (adenocarcinoma) s/p XRT.  Now with recurrence in Lt hilar region and new RUL lesion >> ?metastatic versus new primary.  Pt has decided against any additional radiation therapy, or starting chemotherapy. P: -DNR/DNI -agree with plan for palliative care assessment for hospice   A: Progressive Lt pleural effusion ?malignant >> not causing any symptoms at present. P: -defer thoracentesis at this time -d/w pt about option of therapeutic thoracentesis at some point if she becomes more symptomatic; also discussed option of pleurx catheter if fluid becomes progressive/persistent and causes significant symptoms  Summary: I agree  with her decision to forego additional therapy for her lung cancer.  No need to pursue pleural fluid drainage unless she develops significant symptoms.  She is stable on her outpt COPD regimen.  Coralyn Helling, MD [facility] Pulmonary/Critical Care 06/28/2013, 11:02 AM Pager:  [phone] After 3pm call: [phone]

## 2013-06-29 DIAGNOSIS — Z515 Encounter for palliative care: Secondary | ICD-10-CM

## 2013-06-29 DIAGNOSIS — I951 Orthostatic hypotension: Secondary | ICD-10-CM

## 2013-06-29 DIAGNOSIS — F411 Generalized anxiety disorder: Secondary | ICD-10-CM

## 2013-06-29 MED ORDER — AMLODIPINE BESYLATE 5 MG PO TABS
5.0000 mg | ORAL_TABLET | Freq: Every day | ORAL | Status: DC
  Filled 2013-06-29 (×2): qty 1

## 2013-06-29 MED ORDER — HYDROCORTISONE 1 % EX CREA
TOPICAL_CREAM | Freq: Two times a day (BID) | CUTANEOUS | Status: DC
  Administered 2013-06-29 – 2013-07-02 (×4): via TOPICAL
  Filled 2013-06-29: qty 28

## 2013-06-29 MED ORDER — SODIUM CHLORIDE 0.9 % IV SOLN
INTRAVENOUS | Status: DC
  Administered 2013-06-29 – 2013-07-02 (×5): via INTRAVENOUS

## 2013-06-29 MED ORDER — LEVOTHYROXINE SODIUM 150 MCG PO TABS
150.0000 ug | ORAL_TABLET | Freq: Every day | ORAL | Status: DC
  Administered 2013-06-29 – 2013-07-02 (×4): 150 ug via ORAL
  Filled 2013-06-29 (×6): qty 1

## 2013-06-29 MED ORDER — SODIUM CHLORIDE 0.9 % IV BOLUS (SEPSIS)
500.0000 mL | Freq: Once | INTRAVENOUS | Status: AC
  Administered 2013-06-29: 500 mL via INTRAVENOUS

## 2013-06-29 NOTE — Progress Notes (Signed)
Progress Note from the Palliative Medicine Team at [facility]  Subjective: patient is alert and pleasant, reports continued issues with dizzines (severe at times, Almost passing out), validated by PT (see note)  -daughter Truddie Hidden at bedside with continued questions about dc options and plan of care, family remain hopeful for improvement   Objective: Allergies  Allergen Reactions  . Codeine Hives  . Augmentin [Amoxicillin-Pot Clavulanate] Diarrhea and Nausea And Vomiting   Scheduled Meds: . amitriptyline  25 mg Oral QHS  . amLODipine  5 mg Oral Daily  . aspirin  81 mg Oral QPM  . carvedilol  6.25 mg Oral BID WC  . cefUROXime  250 mg Oral BID WC  . cycloSPORINE  1 drop Both Eyes BID  . donepezil  15 mg Oral QHS  . escitalopram  10 mg Oral Daily  . heparin  5,000 Units Subcutaneous Q8H  . hydrochlorothiazide  12.5 mg Oral Daily  . levothyroxine  150 mcg Oral QAC breakfast  . mesalamine  1,200 mg Oral Q breakfast  . pantoprazole  40 mg Oral q morning - 10a  . sodium chloride  3 mL Intravenous Q12H   Continuous Infusions:  PRN Meds:.albuterol, hydrALAZINE, HYDROcodone-acetaminophen, hydrOXYzine, naproxen sodium, ondansetron  BP 145/60  Pulse 67  Temp(Src) 97.6 F (36.4 C) (Oral)  Resp 18  Ht 5\' 11"  (1.803 m)  Wt 77.111 kg (170 lb)  BMI 23.72 kg/m2  SpO2 94%   PPS:30 %  Pain Score:denies   Intake/Output Summary (Last 24 hours) at 06/29/13 1129 Last data filed at 06/28/13 1900  Gross per 24 hour  Intake      0 ml  Output    200 ml  Net   -200 ml       Physical Exam: General: chronically ill appearing, NAD  HEENT: Mm, no exudate noted  CVS: RRR Chest: CTA  Skin: warm and dry, pale  Ext: without edema  Neuro: Alert and oriented X3  Labs: CBC    Component Value Date/Time   WBC 6.6 06/27/2013 0418   RBC 4.30 06/27/2013 0418   HGB 12.4 06/27/2013 0418   HCT 38.5 06/27/2013 0418   PLT 429* 06/27/2013 0418   MCV 89.5 06/27/2013 0418   MCH 28.8 06/27/2013 0418   MCHC 32.2 06/27/2013 0418   RDW 15.5 06/27/2013 0418   LYMPHSABS 1.1 06/07/2013 1408   MONOABS 0.9 06/07/2013 1408   EOSABS 0.2 06/07/2013 1408   BASOSABS 0.1 06/07/2013 1408    BMET    Component Value Date/Time   NA 135 06/28/2013 0419   K 3.9 06/28/2013 0419   CL 99 06/28/2013 0419   CO2 27 06/28/2013 0419   GLUCOSE 83 06/28/2013 0419   BUN 9 06/28/2013 0419   BUN 13.5 06/10/2013 1603   CREATININE 1.01 06/28/2013 0419   CREATININE 1.0 06/10/2013 1603   CREATININE 0.90 06/07/2013 1408   CALCIUM 10.2 06/28/2013 0419   GFRNONAA 51* 06/28/2013 0419   GFRAA 59* 06/28/2013 0419    CMP     Component Value Date/Time   NA 135 06/28/2013 0419   K 3.9 06/28/2013 0419   CL 99 06/28/2013 0419   CO2 27 06/28/2013 0419   GLUCOSE 83 06/28/2013 0419   BUN 9 06/28/2013 0419   BUN 13.5 06/10/2013 1603   CREATININE 1.01 06/28/2013 0419   CREATININE 1.0 06/10/2013 1603   CREATININE 0.90 06/07/2013 1408   CALCIUM 10.2 06/28/2013 0419   PROT 6.2 06/07/2013 1408   ALBUMIN 3.5 06/07/2013 1408  AST 18 06/07/2013 1408   ALT 13 06/07/2013 1408   ALKPHOS 146* 06/07/2013 1408   BILITOT 0.5 06/07/2013 1408   GFRNONAA 51* 06/28/2013 0419   GFRAA 59* 06/28/2013 0419      Assessment and Plan: 1. Code Status:DNR/DNI 2. Symptom Control:  1. Anxiety:  Lexapro to 10 mg PO daily 2. Nausea/: Vistaril 25 mg every 6 hrs prn-patient reports "I think it has worked some" 3. Weakness: PT/OT evaluation and TX  3. Psycho/Social:  Emotional support offered to patient and daughter 4. Spiritual   Strong community church support 5. Disposition:  Hopeful for SNF for rehabilitation, would be appropriate for Palliative  Care services to follow   Lorinda Creed NP  Palliative Medicine Team Team Phone # [phone] Pager [phone]  Discussed with Dr Tat 1

## 2013-06-29 NOTE — Progress Notes (Addendum)
Clinical Social Work Department CLINICAL SOCIAL WORK PLACEMENT NOTE 06/29/2013  Patient:  Jodi Frazier, Jodi Frazier  Account Number:  [id number] Admit date:  06/26/2013  Clinical Social Worker:  Jacelyn Grip  Date/time:  06/29/2013 11:30 AM  Clinical Social Work is seeking post-discharge placement for this patient at the following level of care:   SKILLED NURSING   (*CSW will update this form in Epic as items are completed)   06/29/2013  Patient/family provided with [facility] Department of Clinical Social Work's list of facilities offering this level of care within the geographic area requested by the patient (or if unable, by the patient's family).  06/29/2013  Patient/family informed of their freedom to choose among providers that offer the needed level of care, that participate in Medicare, Medicaid or managed care program needed by the patient, have an available bed and are willing to accept the patient.  06/29/2013  Patient/family informed of [facility]' ownership interest in [facility], as well as of the fact that they are under no obligation to receive care at this facility.  PASARR submitted to EDS on 06/29/2013 PASARR number received from [employer] on 06/29/2013  FL2 transmitted to all facilities in geographic area requested by pt/family on  06/29/2013 FL2 transmitted to all facilities within larger geographic area on   Patient informed that his/her managed care company has contracts with or will negotiate with  certain facilities, including the following:     Patient/family informed of bed offers received:  06/29/2013 Patient chooses bed at [facility] Physician recommends and patient chooses bed at    Patient to be transferred to  on  [facility] [place] on 07/02/2013 Patient to be transferred to facility by pt daughter via private vehicle  The following physician request were entered in Epic:   Additional Comments:    Jacklynn Lewis, MSW,  LCSW Clinical Social Work [phone]

## 2013-06-29 NOTE — Progress Notes (Signed)
Clinical Social Work Department BRIEF PSYCHOSOCIAL ASSESSMENT 06/29/2013  Patient:  Jodi Frazier, Jodi Frazier     Account Number:  [id number]     Admit date:  06/26/2013  Clinical Social Worker:  Jacelyn Grip  Date/Time:  06/29/2013 11:00 AM  Referred by:  Physician  Date Referred:  06/29/2013 Referred for  SNF Placement   Other Referral:   Interview type:  Patient Other interview type:   and patient daughter at bedside    PSYCHOSOCIAL DATA Living Status:  ALONE Admitted from facility:   Level of care:   Primary support name:  Jodi Frazier/daughter/[phone] Primary support relationship to patient:  CHILD, ADULT Degree of support available:   strong    CURRENT CONCERNS Current Concerns  Post-Acute Placement   Other Concerns:    SOCIAL WORK ASSESSMENT / PLAN CSW met with pt and pt daughter at bedside.    PMT NP present at this time.    CSW introduced self and explained role. CSW discussed recommendation about rehab at [facility]. Pt interested in [facility], [place] [facility] [place], and [place]. Pt agreeable to SNF search to these facilities. CSW clarified pt and pt daughter questions about palliative care services being involved at SNF. CSW and PMT NP encourage pt daughter to advocate for these services upon pt arrival.    Pt became dizzy and felt that she was going to pass out. RN notified and came to assist with checking pt blood pressures. Pt had similar symptoms when working with PT and MD to be notified.    CSW completed FL2 and initiated SNF search to the above facilities.    CSW to follow up with pt and pt daughter with bed offers.    CSW to continue to follow and facilitate pt discharge needs when pt medically ready for discharge.   Assessment/plan status:  Psychosocial Support/Ongoing Assessment of Needs Other assessment/ plan:   discharge planning   Information/referral to community resources:   [facility] list    PATIENT'S/FAMILY'S  RESPONSE TO PLAN OF CARE: Pt alert and oriented x 4.  Pt is open to rehab at SNF, but is concerned about the new onset of dizziness and syncopal episode with PT and hoping that MD will work up further. Pt daughter very supportive and actively involved in pt care.    Jacklynn Lewis, MSW, LCSW Clinical Social Work [phone]

## 2013-06-29 NOTE — Progress Notes (Addendum)
[facility] PROGRESS NOTE  CUMA POLYAKOV ZOX:[id number] DOB: Feb 24, 1931 DOA: 06/26/2013 PCP: Piedad Climes, PA-C  Assessment/Plan: Atypical chest pain  -likely related to patient's anxiety  -Troponins negative  -EKG without any acute ischemic changes  -Continue aspirin daily  - CT angiogram chest negative for pulmonary embolus  -may need outpt stress test  -currently chest pain free  Pleural effusion  -Suspect malignant effusion  -Increased in size compared to prior CT  -Currently stable without any distress  -appreciate Dr. Bertis Ruddy consult-->no chemo  -pt undecided whether she wants to restart XRT  -appreciate pulmonary eval-->no further procedures needed at this time Near syncope -Patient had episode of near syncope and dizziness with physical therapy today -Orthostatics positive -Also concerned about possible BPPV vs VB insufficiency -carotid duplex to also assess vertebro-basilar circulation Orthostatic hypotension -Discontinue HCTZ and amlodipine for now -Continue carvedilol -IV fluids  Hypertensive urgency  -Improved  -Continue HCTZ, amlodipine and carvedilol  Adenocarcinoma of lung  -06/21/2013 PET scan shows hypermetabolic RUL nodule with new left suprahilar metabolic tissue concerning for recurrence  -spoke with Dr. Bertis Ruddy for consult  -last XRT on 11/20/12  -MRI brain neg for mets  Goals of care  -Appreciate palliative medicine evaluation and recommendations  - Patient is DO NOT RESUSCITATE  COPD  -Stable  -Continue BREO and SABA  Recent UTI  -recheck UA--no pyuria  -finish 1 more day of cefuroxime  Hypokalemia  -Repleted  -Check magnesium-1.9  Anxiety  -Start Vistaril 25 mg every 6 hours when necessary anxiety  -Increase Lexapro to 10 mg daily  Chronic nausea  -Likely related to the medications as well as acute medical illness  -Also partly related to anxiety  -Continue symptomatic treatment  -tolerating diet Deconditioning  -PT  evaluation-->SNF Hypothyroidism -TSH 28.27 -Increase Synthroid to 150 mcg  Family Communication: daughter at beside--total time 35 min  Disposition Plan: SNF in 1-2 days          Procedures/Studies: Ct Chest W Contrast  06/01/2013   CLINICAL DATA:  Adenocarcinoma along the radiation therapy complete.  EXAM: CT CHEST AND ABDOMEN WITH CONTRAST  TECHNIQUE: Multidetector CT imaging of the chest and abdomen was performed following the standard protocol during bolus administration of intravenous contrast.  CONTRAST:  OMNIPAQUE IOHEXOL 300 MG/ML  SOLN  COMPARISON:  CT chest 04/24/2013, 02/22/2013  FINDINGS: CT CHEST FINDINGS  There is interval resolution of the left pneumothorax seen on prior. Nodule within the left lower lobe measures 16 x 19 mm compared to 15 x 18 mm on prior for no significant change. Peripheral linear nodularity in the lingula is similar. There is small left effusion which is unchanged.  In the right upper lobe, irregular nodule measures 11 mm compared to 8 mm on prior (image 17). There is biapical pleural parenchymal thickening posteriorly not changed from prior.  Small paratracheal lymph nodes are unchanged and less than 10 mm. No supraclavicular lymphadenopathy. No axillary adenopathy.  CT ABDOMEN FINDINGS  No focal hepatic lesion. The gallbladder, pancreas, spleen, adrenal glands, and kidneys are unchanged. There are simple cysts within the left and right kidney. There is a linear calcification in the right renal collecting system seen best on the coronal images. There is indeterminate on prior CT.  Stomach, small bowel and cecum are normal. Diverticulum of the descending colon.  No retroperitoneal periportal lymphadenopathy.  Within the pelvis, there is a rounded cystic lesion associated with the right adnexa measuring 3.6 cm is again noted. No aggressive osseous lesion.  Right hip arthroplasty noted. (The pelvis was image by mistake and not charge to the patient).   IMPRESSION: 1. Left upper lobe nodule is stable compared to prior. 2. Right upper lobe nodule is increased in size and concerning for bronchogenic carcinoma. 3. Stable linear thickening in the  left upper lobe. 4. Linear density in the right renal collecting system likely represents a calculus. Bilateral nonenhancing renal cysts. 5. No evidence metastasis below diaphragms. 6. Stable right adnexal cystic lesion.   Electronically Signed   By: Genevive Bi M.D.   On: 06/01/2013 16:55   Ct Angio Chest Pe W/cm &/or Wo Cm  06/26/2013   CLINICAL DATA:  History of adenocarcinoma of the lung treated with radiation therapy. Left-sided chest pain.  EXAM: CT ANGIOGRAPHY CHEST WITH CONTRAST  TECHNIQUE: Multidetector CT imaging of the chest was performed using the standard protocol during bolus administration of intravenous contrast. Multiplanar CT image reconstructions including MIPs were obtained to evaluate the vascular anatomy.  CONTRAST:  OMNIPAQUE IOHEXOL 350 MG/ML SOLN  COMPARISON:  Chest CT, 06/01/2013  FINDINGS: No evidence of a pulmonary embolus.  Moderate pleural effusion on the left has increased from the prior study. There is an 18 mm by 16 mm mass posterior to the inferior left hilum, without change. Abnormal soft tissue surrounds the left hilar structures, also stable. There are additional areas of coarse reticular opacity in the left lung which are also stable, likely scarring. A few tiny reticular nodular densities are noted posteriorly in the left lower lobe and left upper lobe.  The 11 mm mass in the right upper lobe, posterior laterally, stable. There are other areas of reticular opacity there are mostly stable. There are subtle tree in bud type opacities in the posterior right lower lobe, which are new. There are no focal areas of consolidation. Posterior and apical upper lobe scarring is stable. No right pleural effusion.  The heart is normal in size. The great vessels are normal in caliber.  The largest mediastinal node is a 10.7 cm precarinal lymph node. This is stable as are the remaining sub cm nodes. No right hilar mass or adenopathy.  Limited evaluation of the upper abdomen shows a partly imaged left renal low-density lesion likely a cyst. It is stable. No adrenal masses.  The bones are demineralized. There are no osteoblastic or osteolytic lesions.  Review of the MIP images confirms the above findings.  IMPRESSION: 1. No evidence of a pulmonary embolism. 2. Changes from treatment of left lung carcinoma are without significant change. There is abnormal soft tissue with associated coarse reticular opacity centered on the left hilum extending into the left lung, which is all stable consistent with post treatment related scarring. There is also a discrete mass in the left lower lobe posterior to the left hilum that is stable. 3. Small mass in the right upper lobe is stable. No new lung masses. 4. The left pleural effusion has increased, now moderate size. 5. There are few subtle tree in bud type opacities in the right lower lobe which are new. This is nonspecific. It may reflect a small area of bronchiolitis. No other change from the prior study.   Electronically Signed   By: Amie Portland M.D.   On: 06/26/2013 18:38   Mr Laqueta Jean ZO Contrast  06/11/2013   CLINICAL DATA:  Adenocarcinoma of lung. Nausea. Rule out metastatic disease.  EXAM: MRI HEAD WITHOUT AND WITH CONTRAST  TECHNIQUE: Multiplanar, multiecho pulse sequences of the brain  and surrounding structures were obtained without and with intravenous contrast.  CONTRAST:  17mL MULTIHANCE GADOBENATE DIMEGLUMINE 529 MG/ML IV SOLN  COMPARISON:  MRI head 09/07/2012  FINDINGS: Generalized atrophy. Minimal chronic microvascular ischemic type changes in the white matter. Negative for acute infarct.  Negative for hemorrhage or mass.  No edema or midline shift.  Normal enhancement following contrast administration.  Paranasal sinuses are clear.   IMPRESSION: Negative for metastatic disease to the brain. No acute abnormality. No change from prior MRI.   Electronically Signed   By: Marlan Palau M.D.   On: 06/11/2013 15:47   Ct Abdomen Pelvis W Contrast  06/01/2013   CLINICAL DATA:  Adenocarcinoma along the radiation therapy complete.  EXAM: CT CHEST AND ABDOMEN WITH CONTRAST  TECHNIQUE: Multidetector CT imaging of the chest and abdomen was performed following the standard protocol during bolus administration of intravenous contrast.  CONTRAST:  OMNIPAQUE IOHEXOL 300 MG/ML  SOLN  COMPARISON:  CT chest 04/24/2013, 02/22/2013  FINDINGS: CT CHEST FINDINGS  There is interval resolution of the left pneumothorax seen on prior. Nodule within the left lower lobe measures 16 x 19 mm compared to 15 x 18 mm on prior for no significant change. Peripheral linear nodularity in the lingula is similar. There is small left effusion which is unchanged.  In the right upper lobe, irregular nodule measures 11 mm compared to 8 mm on prior (image 17). There is biapical pleural parenchymal thickening posteriorly not changed from prior.  Small paratracheal lymph nodes are unchanged and less than 10 mm. No supraclavicular lymphadenopathy. No axillary adenopathy.  CT ABDOMEN FINDINGS  No focal hepatic lesion. The gallbladder, pancreas, spleen, adrenal glands, and kidneys are unchanged. There are simple cysts within the left and right kidney. There is a linear calcification in the right renal collecting system seen best on the coronal images. There is indeterminate on prior CT.  Stomach, small bowel and cecum are normal. Diverticulum of the descending colon.  No retroperitoneal periportal lymphadenopathy.  Within the pelvis, there is a rounded cystic lesion associated with the right adnexa measuring 3.6 cm is again noted. No aggressive osseous lesion. Right hip arthroplasty noted. (The pelvis was image by mistake and not charge to the patient).  IMPRESSION: 1. Left upper lobe  nodule is stable compared to prior. 2. Right upper lobe nodule is increased in size and concerning for bronchogenic carcinoma. 3. Stable linear thickening in the  left upper lobe. 4. Linear density in the right renal collecting system likely represents a calculus. Bilateral nonenhancing renal cysts. 5. No evidence metastasis below diaphragms. 6. Stable right adnexal cystic lesion.   Electronically Signed   By: Genevive Bi M.D.   On: 06/01/2013 16:55   Nm Pet Image Restag (ps) Skull Base To Thigh  06/21/2013   CLINICAL DATA:  Subsequent treatment strategy for lung cancer lung cancer with new solitary pulmonary,.  EXAM: NUCLEAR MEDICINE PET SKULL BASE TO THIGH  FASTING BLOOD GLUCOSE:  Value: 91mg /dl  TECHNIQUE: 40.9 mCi W-11 FDG was injected intravenously. CT data was obtained and used for attenuation correction and anatomic localization only. (This was not acquired as a diagnostic CT examination.) Additional exam technical data entered on technologist worksheet.  COMPARISON:  CT ABD/PELVIS W CM dated 06/01/2013  FINDINGS: NECK  No hypermetabolic lymph nodes in the neck.  CHEST  The right upper lobe pulmonary nodule of concern measuring 12 mm (image 78, series 2) does have associated metabolic activity with SUV max 3.8 consistent with  neoplasm. The left lower lobe pulmonary nodule measuring 20 mm (image 91, series 2) does not have significant metabolic activity; however, there is intense metabolic activity and fullness in the left suprahilar region (image 88)with SUV max = 7.0. There is a focus of metabolic activity within the medial aspect the lingula which could represent postobstructive pneumonitis or malignancy (SUV max 6.7 image 69).  There is mild metabolic nodularity within the periphery of the right upper lobe (image 60) which is indeterminate. Metabolic activity within the thyroid gland is likely related to thyroiditis.  ABDOMEN/PELVIS  No abnormal hypermetabolic activity within the liver, pancreas,  adrenal glands, or spleen. No hypermetabolic lymph nodes in the abdomen or pelvis. No metabolic activity associated with this cystic lesion the right adnexa. Scattered foci of uptake noted in the sigmoid colon likely related to diverticulosis.  SKELETON  No focal hypermetabolic activity to suggest skeletal metastasis.  IMPRESSION: 1. Hypermetabolic right upper lobe pulmonary nodule consistent with either primary bronchogenic carcinoma or metastasis. 2. New left suprahilar metabolic tissue surrounding the upper lobe bronchus is most consistent with lung cancer recurrence. 3. Focus of hypermetabolic consolidation in the lingula either represents a neoplasm versus postobstructive pneumonitis. Favor the latter. 4. Metabolic activity associated with nodular pleural parenchymal thickening at the right lung apex is indeterminate and may be inflammatory. 5. The left lower lobe nodule is not significant hypermetabolic.   Electronically Signed   By: Genevive Bi M.D.   On: 06/21/2013 10:48   Dg Chest Port 1 View  06/26/2013   CLINICAL DATA:  Left-sided chest pain.  EXAM: PORTABLE CHEST - 1 VIEW  COMPARISON:  Chest CT, 06/01/2013 and chest radiographs, 05/12/2013.  FINDINGS: There is irregular fullness of the left hilum with adjacent coarse reticular opacity extends laterally and to the left lung base. There is also coarse irregular scarring in both upper lobes. The lungs are hyperexpanded. Vague nodule is noted in the right upper lobe. No pleural effusion or pneumothorax.  Cardiac silhouette is normal in size. Mediastinum and right hilum are unremarkable.  IMPRESSION: No acute findings. No change from the prior chest radiograph allowing for differences in patient positioning.  Left hilar fullness and perihilar and left lung base scarring, stable. Other areas of lung scarring and the right upper lobe nodule are also stable.   Electronically Signed   By: Amie Portland M.D.   On: 06/26/2013 17:05          Subjective: Patient had an episode of near syncope with physical therapy today. She denied any chest discomfort, shortness breath, vomiting, diarrhea, abdominal pain. She states that she has some dizziness when she turns her head left or right. Denies any visual disturbance.  Objective: Filed Vitals:   06/29/13 1127 06/29/13 1128 06/29/13 1132 06/29/13 1500  BP: 131/46 102/48 114/46 157/47  Pulse: 60 70 72 69  Temp:    97.7 F (36.5 C)  TempSrc:    Oral  Resp:    16  Height:      Weight:      SpO2:    96%    Intake/Output Summary (Last 24 hours) at 06/29/13 2037 Last data filed at 06/29/13 2027  Gross per 24 hour  Intake    518 ml  Output    125 ml  Net    393 ml   Weight change:  Exam:   General:  Pt is alert, follows commands appropriately, not in acute distress  HEENT: No icterus, No thrush, [place]/AT  Cardiovascular: RRR,  S1/S2, no rubs, no gallops  Respiratory: Diminished breath sounds left base. Right clear auscultation  Abdomen: Soft/+BS, non tender, non distended, no guarding  Extremities: trace LE edema, No lymphangitis, No petechiae, No rashes, no synovitis  Data Reviewed: Basic Metabolic Panel:  Recent Labs Lab 06/26/13 1632 06/27/13 0418 06/28/13 0419  NA 136 133* 135  K 4.3 3.2* 3.9  CL 101 97 99  CO2 23 23 27   GLUCOSE 101* 113* 83  BUN 8 7 9   CREATININE 0.72 0.76 1.01  CALCIUM 10.6* 10.0 10.2  MG  --   --  1.9   Liver Function Tests: No results found for this basename: AST, ALT, ALKPHOS, BILITOT, PROT, ALBUMIN,  in the last 168 hours No results found for this basename: LIPASE, AMYLASE,  in the last 168 hours No results found for this basename: AMMONIA,  in the last 168 hours CBC:  Recent Labs Lab 06/26/13 1632 06/27/13 0418  WBC 8.4 6.6  HGB 14.1 12.4  HCT 42.5 38.5  MCV 89.3 89.5  PLT 452* 429*   Cardiac Enzymes:  Recent Labs Lab 06/26/13 1632 06/26/13 2128 06/27/13 0418 06/27/13 1035  TROPONINI <0.30 <0.30  <0.30 <0.30   BNP: No components found with this basename: POCBNP,  CBG: No results found for this basename: GLUCAP,  in the last 168 hours  Recent Results (from the past 240 hour(s))  CLOSTRIDIUM DIFFICILE BY PCR     Status: None   Collection Time    06/28/13 10:00 AM      Result Value Range Status   C difficile by pcr NEGATIVE  NEGATIVE Final   Comment: Performed at [facility]     Scheduled Meds: . amitriptyline  25 mg Oral QHS  . aspirin  81 mg Oral QPM  . carvedilol  6.25 mg Oral BID WC  . cefUROXime  250 mg Oral BID WC  . cycloSPORINE  1 drop Both Eyes BID  . donepezil  15 mg Oral QHS  . escitalopram  10 mg Oral Daily  . heparin  5,000 Units Subcutaneous Q8H  . hydrocortisone cream   Topical BID  . levothyroxine  150 mcg Oral QAC breakfast  . mesalamine  1,200 mg Oral Q breakfast  . pantoprazole  40 mg Oral q morning - 10a  . sodium chloride  3 mL Intravenous Q12H   Continuous Infusions: . sodium chloride 75 mL/hr at 06/29/13 1413     Anvita Hirata, DO  [facility] Pager [phone]  If 7PM-7AM, please contact night-coverage [email or web address] Password [email or web address] 06/29/2013, 8:37 PM   LOS: 3 days

## 2013-06-29 NOTE — Evaluation (Signed)
Physical Therapy Evaluation Patient Details Name: Jodi Frazier MRN: [id number] DOB: Oct 10, 1930 Today's Date: 06/29/2013 Time: 4401-0272 PT Time Calculation (min): 29 min  PT Assessment / Plan / Recommendation History of Present Illness  Jodi Frazier is a 79 y.o. female who presents to the ED with chest pain.  Pain has been episodic, ongoing for the past 2 weeks and worsening.  Occurs in the context of CAD with prior MI and a growing lung cancer.  Patient also reports increasing difficulty with BP control at home.  BP today right before they came to the ED was 210.  In ED initial BP 221/96.  Patient given NTG and hydralazine and BP came down to 180s systolic, chest pain improved.  CTA negative for PE   Clinical Impression  Pt became dizzy, diaphoretic, decreased alertness rwequiring assistance to sit down immediately. BP obtained 133/64, hr 67, Sats 94% RA. RN in to assess. Pt assisted back to bed after improved LOC. Pt will benefit from PT to address problems. Recommend SNF as pt lives alone.    PT Assessment  Patient needs continued PT services    Follow Up Recommendations  SNF    Does the patient have the potential to tolerate intense rehabilitation      Barriers to Discharge Decreased caregiver support      Equipment Recommendations  None recommended by PT    Recommendations for Other Services     Frequency Min 3X/week    Precautions / Restrictions Precautions Precautions: Fall Precaution Comments: diaphoresis, syncope whewalking   Pertinent Vitals/Pain BP after return to supine 153/53, 143/57      Mobility  Bed Mobility Bed Mobility: Supine to Sit;Sit to Supine Supine to Sit: 7: Independent Sit to Supine: 7: Independent Transfers Transfers: Sit to Stand;Stand to Sit Sit to Stand: From bed;4: Min guard;From chair/3-in-1 Stand to Sit: 4: Min assist;To chair/3-in-1;To bed Details for Transfer Assistance: pt became dizzy, diaphoretic, decreased arousal,  manually forced pt to sit down in hallway chair. Ambulation/Gait Ambulation/Gait Assistance: 4: Min assist (neede +2 as pt became dizzy, syncopal.) Ambulation Distance (Feet): 40 Feet Gait Pattern: Step-through pattern    Exercises     PT Diagnosis: Difficulty walking;Altered mental status  PT Problem List: Decreased strength;Decreased activity tolerance;Decreased mobility;Cardiopulmonary status limiting activity;Decreased knowledge of precautions;Decreased safety awareness;Decreased knowledge of use of DME;Decreased cognition PT Treatment Interventions: DME instruction;Gait training;Functional mobility training;Therapeutic activities;Therapeutic exercise;Patient/family education     PT Goals(Current goals can be found in the care plan section) Acute Rehab PT Goals Patient Stated Goal: I need to go to rehab PT Goal Formulation: With patient/family Time For Goal Achievement: 07/13/13 Potential to Achieve Goals: Good  Visit Information  Last PT Received On: 06/29/13 Assistance Needed: +2 (due to hypotension) History of Present Illness: Jodi Frazier is a 79 y.o. female who presents to the ED with chest pain.  Pain has been episodic, ongoing for the past 2 weeks and worsening.  Occurs in the context of CAD with prior MI and a growing lung cancer.  Patient also reports increasing difficulty with BP control at home.  BP today right before they came to the ED was 210.  In ED initial BP 221/96.  Patient given NTG and hydralazine and BP came down to 180s systolic, chest pain improved.  CTA negative for PE        Prior Functioning  Home Living Family/patient expects to be discharged to:: Private residence Living Arrangements: Alone Available Help at Discharge: Skilled Nursing Facility  Cognition  Cognition Arousal/Alertness: Awake/alert Behavior During Therapy: WFL for tasks assessed/performed Overall Cognitive Status: Impaired/Different from baseline Area of Impairment:  Orientation;Memory Orientation Level: Place (knew hospital, not specific)    Extremity/Trunk Assessment Upper Extremity Assessment Upper Extremity Assessment: Overall WFL for tasks assessed Lower Extremity Assessment Lower Extremity Assessment: Overall WFL for tasks assessed Cervical / Trunk Assessment Cervical / Trunk Assessment: Normal   Balance    End of Session PT - End of Session Equipment Utilized During Treatment: Gait belt Activity Tolerance: Treatment limited secondary to medical complications (Comment) (syncopal episode) Patient left: in bed;with call bell/phone within reach;with family/visitor present Nurse Communication: Mobility status;Other (comment) (syncopal episode)  GP     Rada Hay 06/29/2013, 10:55 AM Blanchard Kelch PT [phone]

## 2013-06-29 NOTE — Progress Notes (Signed)
Name: Jodi Frazier MRN: [id number] DOB: 1930/08/02    ADMISSION DATE:  06/26/2013 CONSULTATION DATE:  06/28/2013  REFERRING MD :  Catarina Hartshorn  CHIEF COMPLAINT:  Chest pain  BRIEF PATIENT DESCRIPTION:  79 yo female presented with chest pain, and noted to have elevated BP (221/96) in ER.  She has hx of adenocarcinoma of lung s/p XRT March to [id number], and COPD.  She is followed by Dr. Delton Coombes in pulmonary office.  She was found to have progressive pleural effusion and PCCM consulted to assist with assessment.  SIGNIFICANT EVENTS: 12/17 Admit 12/28 Oncology consulted, Palliative care consulted  STUDIES:  08/14/12 Spirometry >> FEV1 1.4 (52%), FEV1% 65 06/21/13 PET scan >> hypermetabolic RUL nodule, Lt suprahilar hypermetabolic activity, focus of hypermetabolic activity in lingula 06/26/13 CT chest >> no PE, mod Lt pleural effusion, 18 mm noduleLt hilum, 11 mm nodule RUL 06/27/13 Echo >> EF 60 to 65%, grade 1 diastolic dysfx  LINES / TUBES: PIV   CULTURES: 12/29 C diff PCR >>   ANTIBIOTICS: 12/28 cefuroxime >>    SUBJECTIVE:  C/o dizziness, nausea  VITAL SIGNS: Temp:  [97.1 F (36.2 C)-98.4 F (36.9 C)] 97.6 F (36.4 C) (12/30 0552) Pulse Rate:  [58-68] 63 (12/30 0552) Resp:  [18-20] 18 (12/30 0552) BP: (137-192)/(55-68) 137/55 mmHg (12/30 0552) SpO2:  [93 %-96 %] 96 % (12/30 0552) Weight:  [77.111 kg (170 lb)] 77.111 kg (170 lb) (12/30 0343)  PHYSICAL EXAMINATION: General: Pleasant Neuro:  Normal strength, CN intact HEENT:  Pupils reactive, no sinus tenderness, no oral exudate Cardiovascular:  Regular, no murmur Lungs:  Decreased BS Lt base, no wheeze Abdomen:  Soft, non tender Musculoskeletal:  No edema Skin:  No rashes  Labs: CBC Recent Labs     06/26/13  1632  06/27/13  0418  WBC  8.4  6.6  HGB  14.1  12.4  HCT  42.5  38.5  PLT  452*  429*   BMET Recent Labs     06/26/13  1632  06/27/13  0418  06/28/13  0419  NA  136  133*  135  K  4.3   3.2*  3.9  CL  101  97  99  CO2  23  23  27   BUN  8  7  9   CREATININE  0.72  0.76  1.01  GLUCOSE  101*  113*  83    Electrolytes Recent Labs     06/26/13  1632  06/27/13  0418  06/28/13  0419  CALCIUM  10.6*  10.0  10.2  MG   --    --   1.9   Cardiac Enzymes Recent Labs     06/26/13  1632  06/26/13  2128  06/27/13  0418  06/27/13  1035  TROPONINI  <0.30  <0.30  <0.30  <0.30  PROBNP  155.8   --    --    --    Imaging No results found.  ASSESSMENT / PLAN:  A: Chest pain, dyspnea >> likely related to HTN urgency >> improved. P: -per primary team  A: COPD >> stable. P: -continue LABA/ICS combination with prn SABA  A: Hx of NSCLC (adenocarcinoma) s/p XRT.  Now with recurrence in Lt hilar region and new RUL lesion >> ?metastatic versus new primary.  Pt has decided against any additional radiation therapy, or starting chemotherapy. P: -DNR/DNI -agree with plan for palliative care assessment for hospice however pt wants to go to SNF  rehab first , cannot get hospice there but can once d/c to home.  A: Progressive Lt pleural effusion ?malignant >> not causing any symptoms at present. P: -defer thoracentesis at this time -d/w pt about option of therapeutic thoracentesis at some point if she becomes more symptomatic; also discussed option of pleurx catheter if fluid becomes progressive/persistent and causes significant symptoms  Summary: I agree with her decision to forego additional therapy for her lung cancer.  No need to pursue pleural fluid drainage unless she develops significant symptoms.  She is stable on her outpt COPD regimen.  Call Pccm again prn  Shan Levans [facility] Pulmonary/Critical Care 06/29/2013, 9:26 AM Beeper  [phone]  Cell  [phone]  If no response or cell goes to voicemail, call beeper [phone]

## 2013-06-30 DIAGNOSIS — R42 Dizziness and giddiness: Secondary | ICD-10-CM

## 2013-06-30 DIAGNOSIS — R55 Syncope and collapse: Secondary | ICD-10-CM

## 2013-06-30 DIAGNOSIS — F0390 Unspecified dementia without behavioral disturbance: Secondary | ICD-10-CM

## 2013-06-30 LAB — BASIC METABOLIC PANEL
BUN: 8 mg/dL (ref 6–23)
CO2: 27 mEq/L (ref 19–32)
Chloride: 100 mEq/L (ref 96–112)
Creatinine, Ser: 1 mg/dL (ref 0.50–1.10)
GFR calc non Af Amer: 52 mL/min — ABNORMAL LOW (ref >90)
Glucose, Bld: 71 mg/dL (ref 70–99)
Potassium: 3.8 mEq/L (ref 3.7–5.3)

## 2013-06-30 LAB — CBC
HCT: 35.4 % — ABNORMAL LOW (ref 36.0–46.0)
Hemoglobin: 11 g/dL — ABNORMAL LOW (ref 12.0–15.0)
MCH: 28.5 pg (ref 26.0–34.0)
MCHC: 31.1 g/dL (ref 30.0–36.0)
MCV: 91.7 fL (ref 78.0–100.0)
RBC: 3.86 MIL/uL — ABNORMAL LOW (ref 3.87–5.11)
RDW: 15.3 % (ref 11.5–15.5)

## 2013-06-30 MED ORDER — ALPRAZOLAM 0.25 MG PO TABS
0.2500 mg | ORAL_TABLET | Freq: Two times a day (BID) | ORAL | Status: DC | PRN
  Administered 2013-07-01 (×2): 0.25 mg via ORAL
  Filled 2013-06-30 (×2): qty 1

## 2013-06-30 MED ORDER — AMLODIPINE BESYLATE 2.5 MG PO TABS
2.5000 mg | ORAL_TABLET | Freq: Every day | ORAL | Status: DC
  Administered 2013-06-30 – 2013-07-02 (×3): 2.5 mg via ORAL
  Filled 2013-06-30 (×3): qty 1

## 2013-06-30 MED ORDER — HYDROXYZINE HCL 25 MG PO TABS
25.0000 mg | ORAL_TABLET | Freq: Three times a day (TID) | ORAL | Status: DC
  Administered 2013-06-30 – 2013-07-02 (×6): 25 mg via ORAL
  Filled 2013-06-30 (×8): qty 1

## 2013-06-30 MED ORDER — HYDROXYZINE HCL 10 MG PO TABS
5.0000 mg | ORAL_TABLET | Freq: Three times a day (TID) | ORAL | Status: DC
  Filled 2013-06-30 (×3): qty 1

## 2013-06-30 NOTE — Progress Notes (Signed)
CSW continuing to follow for pt disposition planning.  Pt has chosen bed at [facility] [place]. Per MD, pt not yet medically ready for discharge.  CSW contacted [facility] and updated facility.  CSW to continue to follow and facilitate pt discharge needs when pt medically ready for discharge.  Jacklynn Lewis, MSW, LCSW Clinical Social Work [phone]

## 2013-06-30 NOTE — Consult Note (Signed)
NEURO HOSPITALIST CONSULT NOTE    Reason for Consult: dizziness, orthostatic hypotension.  HPI:                                                                                                                                          Jodi Frazier is an 79 y.o. female with a past medical history significant for lung cancer, hypothyroidism, hypertension, dementia, anxiety, who presented to the [facility] ED 06/26/2013 with chest pain.  She was found to have very high BP on the admission, SBP as high as 221. Her chest pain has resolved during her hospital course but main issues remain  Fluctuation of BP, orthostasis, dizziness,  and weakness. Jodi Frazier relates that over the last 2 moths she had had approximately 5 episodes of severe dizziness and had fainted couple of times. She said that she " just got dizzy and feel that I am going to faint". Had had falls resulting from severe dizziness. Stated that " I look pale" but no chest pain, SOB, or palpitations. Denies constipation, urinary retention, or inability to sweat. Incontinent of urine. No shuffling gait but imbalance, no numbness-tingling, no tremors.  No reported hallucinations. Work up in the hospital includes unimpressive MRI brain.   Past Medical History  Diagnosis Date  . Hypertension   . Colitis   . Coronary artery disease   . High cholesterol     takes Red Yeast Rice and Fish OIl daily  . Kidney stones   . PONV (postoperative nausea and vomiting)   . MI (myocardial infarction) 2010  . Shortness of breath     with exertion  . Pneumonia     hx of 20+yrs ago  . COPD (chronic obstructive pulmonary disease)     pleurisy or COPD exascerbation > 30yr ago  . Headache(784.0)   . Vertigo     HTN related bc gets up too fast  . Arthritis   . Bruises easily   . History of shingles 40yrs ago    in eye;occasionally gets some residual   . Accessory skin tags     arms/legs  . GERD (gastroesophageal reflux disease)    . Colitis   . Diverticulosis   . Urinary frequency   . Stress incontinence   . History of kidney stones   . Hypothyroidism     takes sYnthroid daily  . Cataract     early stage on right  . Macular degeneration, wet   . Depression     some but takes Amitryptylline nightly  . Right-sided chest wall pain 08/28/12    PRESENTATION - RIGHT SIDED ANTERIOR CHEST PAIN  . Weight loss 08/28/12    10 LB WEIGHT LOSS OVER 3 MONTHS  . Lung mass 08/12/12    CHEST-XRAY/ PET -LOBULAR  MASS LLL - 4.1 X 3.6 X 4.2 CM  . Dementia     mild  . S/P radiation therapy  10/05/2012-11/20/2012    Left Lower Lung and hilum / 70 Gy in 35 fractions  . Adenocarcinoma of lung 09/07/12    needle core bx-LLL-adenocarcinoma  . Hypertension     Past Surgical History  Procedure Laterality Date  . Hip arthroscopy  33yr ago    right hip-replacement  . Knee arthroscopy      LT  . Total knee arthroplasty  34yrs ago    left  . Appendectomy  51yrs ago  . Abdominal hysterectomy  79yrs ago   . Lithotripsy  2011  . Cataract surgery  2012    left  . Eye surgery  45yrs ago  . Bladder surgery  2001    tacked  . Coronary angioplasty with stent placement  2010    2 in rt coronary artery and 1 in another spot  . Colonoscopy    . Esophagogastroduodenoscopy    . Total knee arthroplasty  09/09/2011    Procedure: TOTAL KNEE ARTHROPLASTY;  Surgeon: Raymon Mutton, MD;  Location: [facility] OR;  Service: Orthopedics;  Laterality: Right;  Right Total Knee Arthroplasty  . Bronchial brush biopsy Left 08/17/12    LLL Bronchial Washing / Brushing and Bronchial Biopsy: Negative for malignancy  . Left lower lobe needle core biopsy Left 09/07/12    Poorly Differentiated Adenocarcinoma   . Joint replacement      Family History  Problem Relation Age of Onset  . Anesthesia problems Mother   . Hypotension Neg Hx   . Malignant hyperthermia Neg Hx   . Pseudochol deficiency Neg Hx   . Cancer Maternal Aunt     breast    Social History:   reports that she quit smoking about 10 years ago. Her smoking use included Cigarettes. She has a 40 pack-year smoking history. She has never used smokeless tobacco. She reports that she drinks alcohol. She reports that she does not use illicit drugs.  Allergies  Allergen Reactions  . Codeine Hives  . Augmentin [Amoxicillin-Pot Clavulanate] Diarrhea and Nausea And Vomiting    MEDICATIONS:                                                                                                                     I have reviewed the patient's current medications.   ROS:  History obtained from the patient and chart review.  General ROS: negative for - chills, fatigue, fever, night sweats, weight gain or weight loss Psychological ROS: negative for - behavioral disorder, hallucinations, memory difficulties, mood swings or suicidal ideation Ophthalmic ROS: negative for - blurry vision, double vision, eye pain or loss of vision ENT ROS: negative for - epistaxis, nasal discharge, oral lesions, sore throat, tinnitus or vertigo Allergy and Immunology ROS: negative for - hives or itchy/watery eyes Hematological and Lymphatic ROS: negative for - bleeding problems, bruising or swollen lymph nodes Endocrine ROS: negative for - galactorrhea, hair pattern changes, polydipsia/polyuria or temperature intolerance Respiratory ROS: negative for - cough, hemoptysis, shortness of breath or wheezing Cardiovascular ROS: negative for - chest pain, dyspnea on exertion, edema or irregular heartbeat Gastrointestinal ROS: negative for - abdominal pain, hematemesis, or stool incontinence Genito-Urinary ROS: negative for - dysuria, hematuria. Significant for bladder incontinence. Musculoskeletal ROS: negative for - joint swelling or muscular weakness Neurological ROS: as noted in HPI Dermatological  ROS: negative for rash and skin lesion changes   Physical exam: pleasant female in no apparent distress. Blood pressure 149/58, pulse 65, temperature 97.6 F (36.4 C), temperature source Oral, resp. rate 16, height 5\' 11"  (1.803 m), weight 77.111 kg (170 lb), SpO2 96.00%. Head: normocephalic. Neck: supple, no bruits, no JVD. Cardiac: no murmurs. Lungs: clear. Abdomen: soft, no tender, no mass. Extremities: no edema.  Neurologic Examination:                                                                                                      Mental Status: Alert, oriented, thought content appropriate.  Speech fluent without evidence of aphasia.  Able to follow 3 step commands without difficulty. Cranial Nerves: II: Discs flat bilaterally; Visual fields grossly normal, pupils equal, round, reactive to light and accommodation III,IV, VI: ptosis not present, extra-ocular motions intact bilaterally V,VII: smile symmetric, facial light touch sensation normal bilaterally VIII: hearing normal bilaterally IX,X: gag reflex present XI: bilateral shoulder shrug XII: midline tongue extension without atrophy or fasciculations  Motor: Right : Upper extremity   5/5    Left:     Upper extremity   5/5  Lower extremity   5/5     Lower extremity   5/5 Tone and bulk:normal tone throughout; no atrophy noted Sensory: Pinprick and light touch intact throughout, bilaterally Deep Tendon Reflexes:  1+ all over Plantars: Right: downgoing   Left: downgoing Cerebellar: normal finger-to-nose,  normal heel-to-shin test Gait:  No ataxia. CV: pulses palpable throughout    No results found for this basename: cbc, bmp, coags, chol, tri, ldl, hga1c    Results for orders placed during the hospital encounter of 06/26/13 (from the past 48 hour(s))  BASIC METABOLIC PANEL     Status: Abnormal   Collection Time    06/30/13  4:05 AM      Result Value Range   Sodium 136 (*) 137 - 147 mEq/L   Comment: Please note  change in reference range.   Potassium 3.8  3.7 - 5.3 mEq/L   Comment:  Please note change in reference range.   Chloride 100  96 - 112 mEq/L   CO2 27  19 - 32 mEq/L   Glucose, Bld 71  70 - 99 mg/dL   BUN 8  6 - 23 mg/dL   Creatinine, Ser 9.56  0.50 - 1.10 mg/dL   Calcium 9.5  8.4 - 21.3 mg/dL   GFR calc non Af Amer 52 (*) >90 mL/min   GFR calc Af Amer 60 (*) >90 mL/min   Comment: (NOTE)     The eGFR has been calculated using the CKD EPI equation.     This calculation has not been validated in all clinical situations.     eGFR's persistently <90 mL/min signify possible Chronic Kidney     Disease.  CBC     Status: Abnormal   Collection Time    06/30/13  4:05 AM      Result Value Range   WBC 5.1  4.0 - 10.5 K/uL   RBC 3.86 (*) 3.87 - 5.11 MIL/uL   Hemoglobin 11.0 (*) 12.0 - 15.0 g/dL   HCT 08.6 (*) 57.8 - 46.9 %   MCV 91.7  78.0 - 100.0 fL   MCH 28.5  26.0 - 34.0 pg   MCHC 31.1  30.0 - 36.0 g/dL   RDW 62.9  52.8 - 41.3 %   Platelets 334  150 - 400 K/uL    No results found.  Assessment/Plan: 79 y/o with chronic episodic dizziness and documented orthostatic hypotension with negative cardiac work up so far. Broad differential including. Although she has not the cardinal manifestations on neuro-exam, Lewy body dementia with autonomic manifestations, and MSA should be in the differential. In addition, the possibility of a paraneoplastic autonomic disorder should also be entertained. Needless to say, patient will require comprehensive testing to try to elucidate a potential neurological cause for her syndrome and such work up should be completed in the outpatient setting. Could consider a trial of Midodrine starting with 2.5 mg daily with further dose adjustments.  Wyatt Portela, MD 06/30/2013, 5:46 PM

## 2013-06-30 NOTE — Progress Notes (Signed)
Bilateral carotid artery duplex:  1-39% ICA stenosis.  Vertebral artery flow is antegrade.     

## 2013-06-30 NOTE — Progress Notes (Signed)
Responded to consult. Jodi Frazier pastor is out of town for the holiday so she appreciated the chaplain visit. She talked about her dizziness and her concerns about it. She requested prayer; prayed. Explained services and offered continuing support.

## 2013-06-30 NOTE — Progress Notes (Signed)
LATE ENTRY from 12/30 at 4:30 pm  CSW met with pt and pt daughter at bedside and discussed SNF bed offers.  Pt chooses [facility] at [place].  CSW spoke with [facility] to notify of pt choice.  CSW to continue to follow and assist with pt discharge planning needs to [facility] when pt medically ready for discharge.  Jacklynn Lewis, MSW, LCSW Clinical Social Work [phone]

## 2013-06-30 NOTE — Progress Notes (Signed)
Progress Note from the Palliative Medicine Team at [facility]  Subjective:   -spoke with daughter by telephone at bedside expresses appreciation for further investigation into "BP issues" by hospitalitis this morning  -continued conversation regarding the importance of clarification of GOC as it will impact continued diagnostics and rehospitalizations   Objective: Allergies  Allergen Reactions  . Codeine Hives  . Augmentin [Amoxicillin-Pot Clavulanate] Diarrhea and Nausea And Vomiting   Scheduled Meds: . amitriptyline  25 mg Oral QHS  . amLODipine  2.5 mg Oral Daily  . aspirin  81 mg Oral QPM  . carvedilol  6.25 mg Oral BID WC  . cefUROXime  250 mg Oral BID WC  . cycloSPORINE  1 drop Both Eyes BID  . donepezil  15 mg Oral QHS  . escitalopram  10 mg Oral Daily  . heparin  5,000 Units Subcutaneous Q8H  . hydrocortisone cream   Topical BID  . hydrOXYzine  25 mg Oral TID  . levothyroxine  150 mcg Oral QAC breakfast  . mesalamine  1,200 mg Oral Q breakfast  . pantoprazole  40 mg Oral q morning - 10a  . sodium chloride  3 mL Intravenous Q12H   Continuous Infusions: . sodium chloride 75 mL/hr at 06/30/13 1013   PRN Meds:.albuterol, ALPRAZolam, hydrALAZINE, HYDROcodone-acetaminophen, naproxen sodium, ondansetron  BP 149/58  Pulse 65  Temp(Src) 97.6 F (36.4 C) (Oral)  Resp 16  Ht 5\' 11"  (1.803 m)  Wt 77.111 kg (170 lb)  BMI 23.72 kg/m2  SpO2 96%   PPS:30 %  Pain Score:*denies   Intake/Output Summary (Last 24 hours) at 06/30/13 1529 Last data filed at 06/30/13 1438  Gross per 24 hour  Intake   2356 ml  Output    125 ml  Net   2231 ml       Physical Exam:  General: chronically ill appearing, NAD  HEENT: Mm, no exudate noted  CVS: RRR  Chest: CTA  Skin: warm and dry, pale  Ext: without edema  Neuro: Alert and oriented X3   Labs: CBC    Component Value Date/Time   WBC 5.1 06/30/2013 0405   RBC 3.86* 06/30/2013 0405   HGB 11.0* 06/30/2013 0405   HCT  35.4* 06/30/2013 0405   PLT 334 06/30/2013 0405   MCV 91.7 06/30/2013 0405   MCH 28.5 06/30/2013 0405   MCHC 31.1 06/30/2013 0405   RDW 15.3 06/30/2013 0405   LYMPHSABS 1.1 06/07/2013 1408   MONOABS 0.9 06/07/2013 1408   EOSABS 0.2 06/07/2013 1408   BASOSABS 0.1 06/07/2013 1408    BMET    Component Value Date/Time   NA 136* 06/30/2013 0405   K 3.8 06/30/2013 0405   CL 100 06/30/2013 0405   CO2 27 06/30/2013 0405   GLUCOSE 71 06/30/2013 0405   BUN 8 06/30/2013 0405   BUN 13.5 06/10/2013 1603   CREATININE 1.00 06/30/2013 0405   CREATININE 1.0 06/10/2013 1603   CREATININE 0.90 06/07/2013 1408   CALCIUM 9.5 06/30/2013 0405   GFRNONAA 52* 06/30/2013 0405   GFRAA 60* 06/30/2013 0405    CMP     Component Value Date/Time   NA 136* 06/30/2013 0405   K 3.8 06/30/2013 0405   CL 100 06/30/2013 0405   CO2 27 06/30/2013 0405   GLUCOSE 71 06/30/2013 0405   BUN 8 06/30/2013 0405   BUN 13.5 06/10/2013 1603   CREATININE 1.00 06/30/2013 0405   CREATININE 1.0 06/10/2013 1603   CREATININE 0.90 06/07/2013 1408  CALCIUM 9.5 06/30/2013 0405   PROT 6.2 06/07/2013 1408   ALBUMIN 3.5 06/07/2013 1408   AST 18 06/07/2013 1408   ALT 13 06/07/2013 1408   ALKPHOS 146* 06/07/2013 1408   BILITOT 0.5 06/07/2013 1408   GFRNONAA 52* 06/30/2013 0405   GFRAA 60* 06/30/2013 0405       1 Assessment and Plan: 1. Code Status:DNR/DNI 2. Symptom Control:       Anxiety: Lexapro to 10 mg PO dail       Nausea/: Vistaril 25 mg every 6 hrs prn-patient reports "I think it has worked some"       Weakness: PT/OT evaluation and TX 3. Psycho/Social: Emotional support offered to patient and daughter. They are encouraged to continue processing overall prognosis and align that with goals of care 4. Spiritual [employer] support 5. Disposition: Hopeful for SNF for rehabilitation, would be appropriate for Palliative Care services to follow    Time In Time Out Total Time Spent with Patient Total Overall  Time  0800 0825 25 min 25 min    Greater than 50%  of this time was spent counseling and coordinating care related to the above assessment and plan.  Lorinda Creed NP  Palliative Medicine Team Team Phone # [phone] Pager [phone]  Discussed with Dr Elisabeth Pigeon 1

## 2013-06-30 NOTE — Progress Notes (Signed)
[facility] PROGRESS NOTE  Jodi Frazier:[id number] DOB: 1931-02-20 DOA: 06/26/2013 PCP: Piedad Climes, PA-C  Brief narrative: 79 y.o. Female with past medical history of lung cancer, hypothyroidism, hypertension who presented to the [facility] ED 06/26/2013 with chest pain. ACS was ruled out with negative cardiac enzymes. She was found to have very high BP on the admission, SBP as high as 221. While chest pain has resolved hospital course remains complicated due to fluctuation BP, orthostasis and weakness. Family has difficult time understanding this issue which may never really resolve and feels sceptical that she is safe in SNF. At this time we will seek neurology for further evaluation of orthostasis.   Assessment/Plan:   Principal Problem: Orthostatic hypotension - unclear etiology - could it be [employer]? In this pt with dementia, urinary incontinence and difficulty maintaining stable BP. Unfortunately there would not be much to do even if it is and at this time I would only want to get input from neurology to see if they have different recommendations - on low dose coreg and norvasc  Active Problem: Atypical chest pain  - ACS ruled out with negative cardiac enzymes - chest pain resolved  - CT angiogram chest negative for pulmonary embolus  Adenocarcinoma of lung  - 06/21/2013 PET scan shows hypermetabolic RUL nodule with new left suprahilar metabolic tissue concerning for recurrence  COPD  - Stable  Recent UTI  - on cefuroxime Hypokalemia  - repleted Anxiety  - Restart Vistaril 25 mg every 6 hours when necessary anxiety  - added low dose xanax PRN   Code Status: DNR/DNI Family Communication: daughter at the bedside Disposition Plan: to SNF once stable   Manson Passey, MD  [facility] Pager [phone]  If 7PM-7AM, please contact night-coverage [email or web address] Password [email or web address] 06/30/2013, 5:22 PM   LOS: 4 days    HPI/Subjective: Reports orthostatic  hypotension, weakness with ambulation.   Objective: Filed Vitals:   06/29/13 1500 06/29/13 2100 06/30/13 0548 06/30/13 1400  BP: 157/47 151/51 152/67 149/58  Pulse: 69 59 64 65  Temp: 97.7 F (36.5 C) 97.7 F (36.5 C) 97.5 F (36.4 C) 97.6 F (36.4 C)  TempSrc: Oral Oral Oral Oral  Resp: 16 16 18 16   Height:      Weight:      SpO2: 96% 97% 100% 96%    Intake/Output Summary (Last 24 hours) at 06/30/13 1722 Last data filed at 06/30/13 1438  Gross per 24 hour  Intake   2356 ml  Output    125 ml  Net   2231 ml    Exam:   General:  Pt is alert, follows commands appropriately, not in acute distress  Cardiovascular: Regular rate and rhythm, S1/S2 appreciated  Respiratory: Clear to auscultation bilaterally, no wheezing, no crackles, no rhonchi  Abdomen: Soft, non tender, non distended, bowel sounds present, no guarding  Extremities:  pulses DP and PT palpable bilaterally  Neuro: Grossly nonfocal  Data Reviewed: Basic Metabolic Panel:  Recent Labs Lab 06/26/13 1632 06/27/13 0418 06/28/13 0419 06/30/13 0405  NA 136 133* 135 136*  K 4.3 3.2* 3.9 3.8  CL 101 97 99 100  CO2 23 23 27 27   GLUCOSE 101* 113* 83 71  BUN 8 7 9 8   CREATININE 0.72 0.76 1.01 1.00  CALCIUM 10.6* 10.0 10.2 9.5  MG  --   --  1.9  --    Liver Function Tests: No results found for this basename: AST, ALT, ALKPHOS, BILITOT, PROT, ALBUMIN,  in the last 168 hours No results found for this basename: LIPASE, AMYLASE,  in the last 168 hours No results found for this basename: AMMONIA,  in the last 168 hours CBC:  Recent Labs Lab 06/26/13 1632 06/27/13 0418 06/30/13 0405  WBC 8.4 6.6 5.1  HGB 14.1 12.4 11.0*  HCT 42.5 38.5 35.4*  MCV 89.3 89.5 91.7  PLT 452* 429* 334   Cardiac Enzymes:  Recent Labs Lab 06/26/13 1632 06/26/13 2128 06/27/13 0418 06/27/13 1035  TROPONINI <0.30 <0.30 <0.30 <0.30   BNP: No components found with this basename: POCBNP,  CBG: No results found for this  basename: GLUCAP,  in the last 168 hours  CLOSTRIDIUM DIFFICILE BY PCR     Status: None   Collection Time    06/28/13 10:00 AM      Result Value Range Status   C difficile by pcr NEGATIVE  NEGATIVE Final   Comment: Performed at [facility]     Studies: No results found.  Scheduled Meds: . amitriptyline  25 mg Oral QHS  . amLODipine  2.5 mg Oral Daily  . aspirin  81 mg Oral QPM  . carvedilol  6.25 mg Oral BID WC  . cefUROXime  250 mg Oral BID WC  . donepezil  15 mg Oral QHS  . escitalopram  10 mg Oral Daily  . hydrocortisone cream   Topical BID  . hydrOXYzine  25 mg Oral TID  . levothyroxine  150 mcg Oral QAC breakfast  . mesalamine  1,200 mg Oral Q breakfast  . pantoprazole  40 mg Oral q morning - 10a   Continuous Infusions: . sodium chloride 75 mL/hr at 06/30/13 1013

## 2013-07-01 MED ORDER — MIDODRINE HCL 2.5 MG PO TABS
2.5000 mg | ORAL_TABLET | Freq: Two times a day (BID) | ORAL | Status: DC
  Administered 2013-07-01 – 2013-07-02 (×2): 2.5 mg via ORAL
  Filled 2013-07-01 (×4): qty 1

## 2013-07-01 NOTE — Progress Notes (Signed)
Physical Therapy Treatment Patient Details Name: Jodi Frazier MRN: [id number] DOB: 1931-03-23 Today's Date: 07/01/2013 Time: 1448-1856 PT Time Calculation (min): 35 min  PT Assessment / Plan / Recommendation  History of Present Illness Jodi Frazier is a 80 y.o. female who presents to the ED with chest pain.  Pain has been episodic, ongoing for the past 2 weeks and worsening.  Occurs in the context of CAD with prior MI and a growing lung cancer.  Patient also reports increasing difficulty with BP control at home.  BP today right before they came to the ED was 210.  In ED initial BP 221/96.  Patient given NTG and hydralazine and BP came down to 314H systolic, chest pain improved.  CTA negative for PE    PT Comments   Pt in bed on 2 lts O2 sats 98%.  Assisted to EOB with no c/o dizziness and allowed increased time for position change.  Amb in hallway twice with one sitting rest break.  Pt demon limited activity tolerance demon 3/4 DOE.   Returned to room when daughter arrived.   Follow Up Recommendations  SNF [facility])     Does the patient have the potential to tolerate intense rehabilitation     Barriers to Discharge        Equipment Recommendations       Recommendations for Other Services    Frequency Min 3X/week   Progress towards PT Goals Progress towards PT goals: Progressing toward goals  Plan      Precautions / Restrictions Precautions Precautions: Fall Precaution Comments: diaphoresis, syncope whewalking Restrictions Weight Bearing Restrictions: No    Pertinent Vitals/Pain No c/o pain RA during gait avg 97%    Mobility  Bed Mobility Bed Mobility: Supine to Sit Supine to Sit: 6: Modified independent (Device/Increase time) Details for Bed Mobility Assistance: increased time Transfers Transfers: Sit to Stand;Stand to Sit Sit to Stand: From bed;4: Min guard;From chair/3-in-1;4: Min assist Stand to Sit: 4: Min assist;4: Min guard;To chair/3-in-1 Details for  Transfer Assistance: 25% VC's on safety with hand placement and turn completion prior to sit as pt demon increased anxiety with increased fatigue (fear of passing out) Ambulation/Gait Ambulation/Gait Assistance: 4: Min assist Ambulation Distance (Feet): 90 Feet (45 feet twice with one sitting rest break) Assistive device: Rolling walker Ambulation/Gait Assistance Details: amb with cahir following close behind as pt has a HX of syncope and demon increased anxiety with increased fatigue with tremors throughout.  Pt tolerated amb twice.  RA avg 97% with HR 77.  Demon 3/4 DOE with limited activity tolerance given her CA Dx.  Gait Pattern: Step-through pattern;Shuffle;Ataxic;Decreased step length - left;Decreased step length - right Gait velocity: decreased     PT Goals (current goals can now be found in the care plan section)    Visit Information  Last PT Received On: 07/01/13 Assistance Needed: +2 (for safety to have chair follow) History of Present Illness: Jodi Frazier is a 80 y.o. female who presents to the ED with chest pain.  Pain has been episodic, ongoing for the past 2 weeks and worsening.  Occurs in the context of CAD with prior MI and a growing lung cancer.  Patient also reports increasing difficulty with BP control at home.  BP today right before they came to the ED was 210.  In ED initial BP 221/96.  Patient given NTG and hydralazine and BP came down to 702O systolic, chest pain improved.  CTA negative for PE  Subjective Data      Cognition       Balance     End of Session PT - End of Session Equipment Utilized During Treatment: Gait belt Activity Tolerance: Treatment limited secondary to medical complications (Comment) Patient left: in chair;with call bell/phone within reach;with family/visitor present   Rica Koyanagi  PTA [facility]  Acute  Rehab Pager      [phone]

## 2013-07-01 NOTE — Progress Notes (Signed)
[facility] PROGRESS NOTE  Jodi Frazier JQZ:[id number] DOB: 10-24-1930 DOA: 06/26/2013 PCP: Leeanne Rio, PA-C  Brief narrative: 80 y.o. Female with past medical history of lung cancer, hypothyroidism, hypertension who presented to the [facility] ED 06/26/2013 with chest pain. ACS was ruled out with negative cardiac enzymes. She was found to have very high BP on the admission, SBP as high as 221. While chest pain has resolved hospital course remains complicated due to fluctuation BP, orthostasis and weakness. Family has difficult time understanding this issue which may never really resolve and feels sceptical that she is safe in SNF. At this time we will seek neurology for further evaluation of orthostasis.   Assessment/Plan:   Principal Problem:  Orthostatic hypotension  - unclear etiology but possibly multiple system atrophy in pt with dementia, urinary incontinence and difficulty maintaining stable BP. Will try low dose midodrine as neurology recomended - on low dose coreg and norvasc  Active Problem:  Atypical chest pain  - ACS ruled out with negative cardiac enzymes  - chest pain resolved  - CT angiogram chest negative for pulmonary embolus  Adenocarcinoma of lung  - 06/21/2013 PET scan shows hypermetabolic RUL nodule with new left suprahilar metabolic tissue concerning for recurrence  COPD  - Stable  Recent UTI  - on cefuroxime  Hypokalemia  - repleted  Anxiety  - Restart Vistaril 25 mg every 6 hours when necessary anxiety  - added low dose xanax PRN   Code Status: DNR/DNI  Family Communication: daughter at the bedside  Disposition Plan: to SNF once stable     Leisa Lenz, MD  [facility] Pager [phone]  If 7PM-7AM, please contact night-coverage [email or web address] Password [email or web address] 07/01/2013, 5:05 PM   LOS: 5 days   Consultants:  Neurology  Procedures:  None   Antibiotics:  None   HPI/Subjective: No overnight events.   Objective: Filed  Vitals:   06/30/13 1400 06/30/13 2126 07/01/13 0536 07/01/13 1434  BP: 149/58 170/59 195/68 150/51  Pulse: 65 62 69 66  Temp: 97.6 F (36.4 C) 97.5 F (36.4 C) 97.5 F (36.4 C) 97.5 F (36.4 C)  TempSrc: Oral Oral Oral Oral  Resp: 16 18 18 16   Height:      Weight:      SpO2: 96% 98% 97% 100%    Intake/Output Summary (Last 24 hours) at 07/01/13 1705 Last data filed at 07/01/13 1434  Gross per 24 hour  Intake 2202.5 ml  Output   1601 ml  Net  601.5 ml    Exam:   General:  Pt is alert, follows commands appropriately, not in acute distress  Cardiovascular: Regular rate and rhythm, S1/S2 appreciated   Respiratory: Clear to auscultation bilaterally, no wheezing, no crackles, no rhonchi  Abdomen: Soft, non tender, non distended, bowel sounds present, no guarding  Extremities: No edema, pulses DP and PT palpable bilaterally  Neuro: Grossly nonfocal  Data Reviewed: Basic Metabolic Panel:  Recent Labs Lab 06/26/13 1632 06/27/13 0418 06/28/13 0419 06/30/13 0405  NA 136 133* 135 136*  K 4.3 3.2* 3.9 3.8  CL 101 97 99 100  CO2 23 23 27 27   GLUCOSE 101* 113* 83 71  BUN 8 7 9 8   CREATININE 0.72 0.76 1.01 1.00  CALCIUM 10.6* 10.0 10.2 9.5  MG  --   --  1.9  --    Liver Function Tests: No results found for this basename: AST, ALT, ALKPHOS, BILITOT, PROT, ALBUMIN,  in the last 168 hours No results  found for this basename: LIPASE, AMYLASE,  in the last 168 hours No results found for this basename: AMMONIA,  in the last 168 hours CBC:  Recent Labs Lab 06/26/13 1632 06/27/13 0418 06/30/13 0405  WBC 8.4 6.6 5.1  HGB 14.1 12.4 11.0*  HCT 42.5 38.5 35.4*  MCV 89.3 89.5 91.7  PLT 452* 429* 334   Cardiac Enzymes:  Recent Labs Lab 06/26/13 1632 06/26/13 2128 06/27/13 0418 06/27/13 1035  TROPONINI <0.30 <0.30 <0.30 <0.30   BNP: No components found with this basename: POCBNP,  CBG: No results found for this basename: GLUCAP,  in the last 168 hours  Recent  Results (from the past 240 hour(s))  CLOSTRIDIUM DIFFICILE BY PCR     Status: None   Collection Time    06/28/13 10:00 AM      Result Value Range Status   C difficile by pcr NEGATIVE  NEGATIVE Final   Comment: Performed at [facility]     Studies: No results found.  Scheduled Meds: . amitriptyline  25 mg Oral QHS  . amLODipine  2.5 mg Oral Daily  . aspirin  81 mg Oral QPM  . carvedilol  6.25 mg Oral BID WC  . cefUROXime  250 mg Oral BID WC  . cycloSPORINE  1 drop Both Eyes BID  . donepezil  15 mg Oral QHS  . escitalopram  10 mg Oral Daily  . heparin  5,000 Units Subcutaneous Q8H  . hydrocortisone cream   Topical BID  . hydrOXYzine  25 mg Oral TID  . levothyroxine  150 mcg Oral QAC breakfast  . mesalamine  1,200 mg Oral Q breakfast  . midodrine  2.5 mg Oral BID WC  . pantoprazole  40 mg Oral q morning - 10a  . sodium chloride  3 mL Intravenous Q12H   Continuous Infusions: . sodium chloride 75 mL/hr at 07/01/13 1159

## 2013-07-02 ENCOUNTER — Other Ambulatory Visit: Payer: Self-pay | Admitting: Physician Assistant

## 2013-07-02 MED ORDER — HYDROCORTISONE 1 % EX CREA: TOPICAL_CREAM | Freq: Two times a day (BID) | CUTANEOUS | Status: DC

## 2013-07-02 MED ORDER — HYDROCODONE-ACETAMINOPHEN 5-325 MG PO TABS: 1.0000 | ORAL_TABLET | Freq: Four times a day (QID) | ORAL | Status: DC | PRN

## 2013-07-02 MED ORDER — ESCITALOPRAM OXALATE 5 MG PO TABS: 5.0000 mg | ORAL_TABLET | Freq: Every day | ORAL | Status: DC

## 2013-07-02 MED ORDER — HYDROXYZINE HCL 25 MG PO TABS: 25.0000 mg | ORAL_TABLET | Freq: Three times a day (TID) | ORAL | Status: DC

## 2013-07-02 MED ORDER — MIDODRINE HCL 2.5 MG PO TABS: 2.5000 mg | ORAL_TABLET | Freq: Two times a day (BID) | ORAL | Status: DC

## 2013-07-02 MED ORDER — ALPRAZOLAM 0.25 MG PO TABS: 0.2500 mg | ORAL_TABLET | Freq: Two times a day (BID) | ORAL | Status: DC | PRN

## 2013-07-02 MED ORDER — AMITRIPTYLINE HCL 25 MG PO TABS: 25.0000 mg | ORAL_TABLET | Freq: Every day | ORAL | Status: DC

## 2013-07-02 NOTE — Consult Note (Signed)
I have reviewed this case with our NP and agree with the Assessment and Plan as stated.  Alyene Predmore L. [place] Monds, MD MBA The Palliative Medicine Team at [place] Team Phone: [phone] Pager: [phone]   

## 2013-07-02 NOTE — Progress Notes (Addendum)
Physical Therapy Treatment Patient Details Name: Jodi Frazier MRN: [id number] DOB: Sep 20, 1930 Today's Date: 07/02/2013 Time: 0109-3235 PT Time Calculation (min): 27 min  PT Assessment / Plan / Recommendation  History of Present Illness Jodi Frazier is a 80 y.o. female who presents to the ED with chest pain.  Pain has been episodic, ongoing for the past 2 weeks and worsening.  Occurs in the context of CAD with prior MI and a growing lung cancer.  Patient also reports increasing difficulty with BP control at home.  BP today right before they came to the ED was 210.  In ED initial BP 221/96.  Patient given NTG and hydralazine and BP came down to 573U systolic, chest pain improved.  CTA negative for PE    PT Comments   Pt stated she was feeling better.  Assisted OOB to amb limited distance due to 3/4 DOE.  Mild c/o dizzyness with each position change.  Pt required increased time and chair following during gait for safety.  Assisted pt back to bed.   Follow Up Recommendations  SNF [facility])     Does the patient have the potential to tolerate intense rehabilitation     Barriers to Discharge        Equipment Recommendations       Recommendations for Other Services    Frequency Min 3X/week   Progress towards PT Goals Progress towards PT goals: Progressing toward goals  Plan      Precautions / Restrictions Precautions Precautions: Fall Precaution Comments: diaphoresis, syncope whewalking Restrictions Weight Bearing Restrictions: No    Pertinent Vitals/Pain No c/o pain    Mobility  Bed Mobility Bed Mobility: Supine to Sit;Sit to Supine Supine to Sit: 6: Modified independent (Device/Increase time) Sit to Supine: 6: Modified independent (Device/Increase time) Details for Bed Mobility Assistance: assist with covers on/off and increased time to sit EOB caution to Hx dizzyness Transfers Transfers: Sit to Stand;Stand to Sit Sit to Stand: From chair/3-in-1;From bed;4: Min  guard Stand to Sit: To chair/3-in-1;To bed;4: Min guard Details for Transfer Assistance: 25% VC's on safety with hand placement and turn completion prior to sit as pt demon increased anxiety with increased fatigue (fear of passing out) Ambulation/Gait Ambulation/Gait Assistance: 4: Min assist Ambulation Distance (Feet): 100 Feet (50 feet x 2 one sitting rest break) Assistive device: Rolling walker Ambulation/Gait Assistance Details: amb with recliner following close behind due to hx dizzyness/syncope.   Pt required increased time for position changes and one sitting rest break due to 3/4 DOE.  Very limited activity tolerance due to Lung CA tolerated amb on RA avg 96%.  Gait Pattern: Step-through pattern;Shuffle;Ataxic;Decreased step length - left;Decreased step length - right Gait velocity: decreased     PT Goals (current goals can now be found in the care plan section)    Visit Information  Last PT Received On: 07/02/13 Assistance Needed: +1 History of Present Illness: Jodi Frazier is a 80 y.o. female who presents to the ED with chest pain.  Pain has been episodic, ongoing for the past 2 weeks and worsening.  Occurs in the context of CAD with prior MI and a growing lung cancer.  Patient also reports increasing difficulty with BP control at home.  BP today right before they came to the ED was 210.  In ED initial BP 221/96.  Patient given NTG and hydralazine and BP came down to 202R systolic, chest pain improved.  CTA negative for PE     Subjective Data  Cognition       Balance     End of Session PT - End of Session Equipment Utilized During Treatment: Gait belt Activity Tolerance: Treatment limited secondary to medical complications (Comment) (SOB/Dyspnes) Patient left: in bed;with call bell/phone within reach   Rica Koyanagi  PTA [facility]  Acute  Rehab Pager      [phone]

## 2013-07-02 NOTE — Discharge Summary (Signed)
Physician Discharge Summary  Jodi Frazier ZHG:[id number] DOB: 1930/12/17 DOA: 06/26/2013  PCP: Leeanne Rio, PA-C  Admit date: 06/26/2013 Discharge date: 07/02/2013  Recommendations for Outpatient Follow-up:  1. Please allow for higher parameters of systolic BP. For this patient we can tolerate as high as SBP 160 because she has significant fluctuations in BP. She can continue low dose norvasc and coreg as prescribed 2. Additional medication was midodrine 2.5 mg BID  3. May continue other medications as prescribed.   Discharge Diagnoses:  Principal Problem:   Chest pain Active Problems:   Adenocarcinoma of lung   Malignant neoplasm of lower lobe, bronchus, or lung   Radiation pneumonitis   COPD (chronic obstructive pulmonary disease)   Nausea alone   Hypertensive urgency   Malignant pleural effusion   Palliative care encounter   Anxiety state, unspecified   Orthostatic hypotension    Discharge Condition: medically stable for discharge to SNF today; family aware and agrees with discharge plan.   Diet recommendation: as tolerated   History of present illness:  80 y.o. Female with past medical history of lung cancer, hypothyroidism, hypertension who presented to the [facility] ED 06/26/2013 with chest pain. ACS was ruled out with negative cardiac enzymes. She was found to have very high BP on the admission, SBP as high as 221. While chest pain has resolved hospital course remains complicated due to fluctuation BP, orthostasis and weakness. Family has difficult time understanding this issue which may never really resolve and feels sceptical that she is safe in SNF. Per neurology recommendations we have added low dose midodrine and pt reports feeling fine so we will continue this medication on discharge. Pt is stable for discharge to SNF today.   Assessment/Plan:   Principal Problem:  Orthostatic hypotension  - unclear etiology but possibly multiple system atrophy in pt with  dementia, urinary incontinence and difficulty maintaining stable BP. Continue midodrine as neurology recomended  - on low dose coreg and norvasc  Active Problem:  Atypical chest pain  - ACS ruled out with negative cardiac enzymes  - chest pain resolved  - CT angiogram chest negative for pulmonary embolus  Adenocarcinoma of lung  - 06/21/2013 PET scan shows hypermetabolic RUL nodule with new left suprahilar metabolic tissue concerning for recurrence  COPD  - Stable  Recent UTI  - on cefuroxime which can be stopped today prior to discharge  Hypokalemia  - repleted  Anxiety  - Restart Vistaril 25 mg TID scheduled  - added low dose xanax PRN which may be continue in SNF  Code Status: DNR/DNI  Family Communication: daughter at the bedside    Signed:  Leisa Lenz, MD  [facility] 07/02/2013, 11:38 AM  Pager #: [phone]   Discharge Exam: Filed Vitals:   07/02/13 0524  BP: 162/65  Pulse: 70  Temp: 97.2 F (36.2 C)  Resp: 16   Filed Vitals:   07/01/13 0536 07/01/13 1434 07/01/13 2030 07/02/13 0524  BP: 195/68 150/51 160/60 162/65  Pulse: 69 66 69 70  Temp: 97.5 F (36.4 C) 97.5 F (36.4 C) 97.6 F (36.4 C) 97.2 F (36.2 C)  TempSrc: Oral Oral Oral Oral  Resp: 18 16 16 16   Height:      Weight:      SpO2: 97% 100% 96% 96%    General: Pt is alert, follows commands appropriately, not in acute distress Cardiovascular: Regular rate and rhythm, S1/S2 +, no murmurs, no rubs, no gallops Respiratory: Clear to auscultation bilaterally, no  wheezing, no crackles, no rhonchi Abdominal: Soft, non tender, non distended, bowel sounds +, no guarding Extremities: no edema, no cyanosis, pulses palpable bilaterally DP and PT Neuro: Grossly nonfocal  Discharge Instructions  Discharge Orders   Future Appointments Provider Department Dept Phone   07/07/2013 2:30 PM Lora Paula, MD [place] [phone]   Joint Appt Eppie Gibson, MD  [place] [facility] [phone]   Joint Appt [facility] [place] [facility] [phone]   Future Orders Complete By Expires   Call MD for:  difficulty breathing, headache or visual disturbances  As directed    Call MD for:  persistant dizziness or light-headedness  As directed    Call MD for:  persistant nausea and vomiting  As directed    Call MD for:  severe uncontrolled pain  As directed    Diet - low sodium heart healthy  As directed    Discharge instructions  As directed    Comments:     1. Please allow for higher parameters of systolic BP. For this patient we can tolerate as high as SBP 160 because she has significant fluctuations in BP. She can continue low dose norvasc and coreg as prescribed 2. Additional medication was midodrine 2.5 mg BID  3. May continue other medications as prescribed.   Increase activity slowly  As directed        Medication List    STOP taking these medications       cefUROXime 250 MG tablet  Commonly known as:  CEFTIN      TAKE these medications       albuterol 108 (90 BASE) MCG/ACT inhaler  Commonly known as:  PROVENTIL HFA;VENTOLIN HFA  Inhale 2 puffs into the lungs every 6 (six) hours as needed for wheezing.     ALPRAZolam 0.25 MG tablet  Commonly known as:  XANAX  Take 1 tablet (0.25 mg total) by mouth 2 (two) times daily as needed for anxiety.     amitriptyline 25 MG tablet  Commonly known as:  ELAVIL  Take 1 tablet (25 mg total) by mouth at bedtime.     amLODipine 2.5 MG tablet  Commonly known as:  NORVASC  Take 2.5 mg by mouth daily.     aspirin 81 MG tablet  Take 81 mg by mouth every evening.     carvedilol 6.25 MG tablet  Commonly known as:  COREG  Take 6.25 mg by mouth 2 (two) times daily with a meal.     cycloSPORINE 0.05 % ophthalmic emulsion  Commonly known as:  RESTASIS  Place 1 drop into both eyes 2 (two) times daily.     donepezil 10 MG tablet  Commonly  known as:  ARICEPT  Take 15 mg by mouth at bedtime. Takes 1 and 1/2     escitalopram 5 MG tablet  Commonly known as:  LEXAPRO  Take 1 tablet (5 mg total) by mouth daily.     Fluticasone Furoate-Vilanterol 100-25 MCG/INH Aepb  Commonly known as:  BREO ELLIPTA  Inhale 100 mcg into the lungs daily.     HYDROcodone-acetaminophen 5-325 MG per tablet  Commonly known as:  NORCO/VICODIN  Take 1-2 tablets by mouth every 6 (six) hours as needed for moderate pain.     hydrocortisone cream 1 %  Apply topically 2 (two) times daily.     hydrOXYzine 25 MG tablet  Commonly known as:  ATARAX/VISTARIL  Take 1  tablet (25 mg total) by mouth 3 (three) times daily.     levothyroxine 125 MCG tablet  Commonly known as:  SYNTHROID  Take 1 tablet (125 mcg total) by mouth daily before breakfast.     mesalamine 1.2 G EC tablet  Commonly known as:  LIALDA  Take 1,200 mg by mouth daily with breakfast.     midodrine 2.5 MG tablet  Commonly known as:  PROAMATINE  Take 1 tablet (2.5 mg total) by mouth 2 (two) times daily with a meal.     naproxen sodium 220 MG tablet  Commonly known as:  ANAPROX  Take 220 mg by mouth 2 (two) times daily as needed (pain).     nitroGLYCERIN 0.4 MG SL tablet  Commonly known as:  NITROSTAT  Place 0.4 mg under the tongue every 5 (five) minutes as needed for chest pain.     ondansetron 4 MG tablet  Commonly known as:  ZOFRAN  Take 1 tablet (4 mg total) by mouth every 8 (eight) hours as needed for nausea or vomiting.     pantoprazole 40 MG tablet  Commonly known as:  PROTONIX  Take 40 mg by mouth every morning.     VITAMIN B-12 IJ  Inject 1,000 mcg/mL as directed every 30 (thirty) days.           Follow-up Information   Follow up with Leeanne Rio, PA-C. Schedule an appointment as soon as possible for a visit in 2 weeks.   Specialty:  Physician Assistant   Contact information:   [place] [address] [phone]         The results of significant diagnostics from this hospitalization (including imaging, microbiology, ancillary and laboratory) are listed below for reference.    Significant Diagnostic Studies: Ct Angio Chest Pe W/cm &/or Wo Cm  06/26/2013   CLINICAL DATA:  History of adenocarcinoma of the lung treated with radiation therapy. Left-sided chest pain.  EXAM: CT ANGIOGRAPHY CHEST WITH CONTRAST  TECHNIQUE: Multidetector CT imaging of the chest was performed using the standard protocol during bolus administration of intravenous contrast. Multiplanar CT image reconstructions including MIPs were obtained to evaluate the vascular anatomy.  CONTRAST:  152mL OMNIPAQUE IOHEXOL 350 MG/ML SOLN  COMPARISON:  Chest CT, 06/01/2013  FINDINGS: No evidence of a pulmonary embolus.  Moderate pleural effusion on the left has increased from the prior study. There is an 18 mm by 16 mm mass posterior to the inferior left hilum, without change. Abnormal soft tissue surrounds the left hilar structures, also stable. There are additional areas of coarse reticular opacity in the left lung which are also stable, likely scarring. A few tiny reticular nodular densities are noted posteriorly in the left lower lobe and left upper lobe.  The 11 mm mass in the right upper lobe, posterior laterally, stable. There are other areas of reticular opacity there are mostly stable. There are subtle tree in bud type opacities in the posterior right lower lobe, which are new. There are no focal areas of consolidation. Posterior and apical upper lobe scarring is stable. No right pleural effusion.  The heart is normal in size. The great vessels are normal in caliber. The largest mediastinal node is a 10.7 cm precarinal lymph node. This is stable as are the remaining sub cm nodes. No right hilar mass or adenopathy.  Limited evaluation of the upper abdomen shows a partly imaged left renal low-density lesion likely a cyst. It is  stable. No adrenal masses.   The bones are demineralized. There are no osteoblastic or osteolytic lesions.  Review of the MIP images confirms the above findings.  IMPRESSION: 1. No evidence of a pulmonary embolism. 2. Changes from treatment of left lung carcinoma are without significant change. There is abnormal soft tissue with associated coarse reticular opacity centered on the left hilum extending into the left lung, which is all stable consistent with post treatment related scarring. There is also a discrete mass in the left lower lobe posterior to the left hilum that is stable. 3. Small mass in the right upper lobe is stable. No new lung masses. 4. The left pleural effusion has increased, now moderate size. 5. There are few subtle tree in bud type opacities in the right lower lobe which are new. This is nonspecific. It may reflect a small area of bronchiolitis. No other change from the prior study.   Electronically Signed   By: Lajean Manes M.D.   On: 06/26/2013 18:38   Mr Jeri Cos CN Contrast  06/11/2013   CLINICAL DATA:  Adenocarcinoma of lung. Nausea. Rule out metastatic disease.  EXAM: MRI HEAD WITHOUT AND WITH CONTRAST  TECHNIQUE: Multiplanar, multiecho pulse sequences of the brain and surrounding structures were obtained without and with intravenous contrast.  CONTRAST:  35mL MULTIHANCE GADOBENATE DIMEGLUMINE 529 MG/ML IV SOLN  COMPARISON:  MRI head 09/07/2012  FINDINGS: Generalized atrophy. Minimal chronic microvascular ischemic type changes in the white matter. Negative for acute infarct.  Negative for hemorrhage or mass.  No edema or midline shift.  Normal enhancement following contrast administration.  Paranasal sinuses are clear.  IMPRESSION: Negative for metastatic disease to the brain. No acute abnormality. No change from prior MRI.   Electronically Signed   By: Franchot Gallo M.D.   On: 06/11/2013 15:47   Nm Pet Image Restag (ps) Skull Base To Thigh  06/21/2013   CLINICAL DATA:  Subsequent treatment strategy for lung  cancer lung cancer with new solitary pulmonary,.  EXAM: NUCLEAR MEDICINE PET SKULL BASE TO THIGH  FASTING BLOOD GLUCOSE:  Value: 91mg /dl  TECHNIQUE: 17.6 mCi F-18 FDG was injected intravenously. CT data was obtained and used for attenuation correction and anatomic localization only. (This was not acquired as a diagnostic CT examination.) Additional exam technical data entered on technologist worksheet.  COMPARISON:  CT ABD/PELVIS W CM dated 06/01/2013  FINDINGS: NECK  No hypermetabolic lymph nodes in the neck.  CHEST  The right upper lobe pulmonary nodule of concern measuring 12 mm (image 78, series 2) does have associated metabolic activity with SUV max 3.8 consistent with neoplasm. The left lower lobe pulmonary nodule measuring 20 mm (image 91, series 2) does not have significant metabolic activity; however, there is intense metabolic activity and fullness in the left suprahilar region (image 88)with SUV max = 7.0. There is a focus of metabolic activity within the medial aspect the lingula which could represent postobstructive pneumonitis or malignancy (SUV max 6.7 image 69).  There is mild metabolic nodularity within the periphery of the right upper lobe (image 60) which is indeterminate. Metabolic activity within the thyroid gland is likely related to thyroiditis.  ABDOMEN/PELVIS  No abnormal hypermetabolic activity within the liver, pancreas, adrenal glands, or spleen. No hypermetabolic lymph nodes in the abdomen or pelvis. No metabolic activity associated with this cystic lesion the right adnexa. Scattered foci of uptake noted in the sigmoid colon likely related to diverticulosis.  SKELETON  No focal hypermetabolic activity to suggest skeletal  metastasis.  IMPRESSION: 1. Hypermetabolic right upper lobe pulmonary nodule consistent with either primary bronchogenic carcinoma or metastasis. 2. New left suprahilar metabolic tissue surrounding the upper lobe bronchus is most consistent with lung cancer recurrence. 3.  Focus of hypermetabolic consolidation in the lingula either represents a neoplasm versus postobstructive pneumonitis. Favor the latter. 4. Metabolic activity associated with nodular pleural parenchymal thickening at the right lung apex is indeterminate and may be inflammatory. 5. The left lower lobe nodule is not significant hypermetabolic.   Electronically Signed   By: Suzy Bouchard M.D.   On: 06/21/2013 10:48   Dg Chest Port 1 View  06/26/2013   CLINICAL DATA:  Left-sided chest pain.  EXAM: PORTABLE CHEST - 1 VIEW  COMPARISON:  Chest CT, 06/01/2013 and chest radiographs, 05/12/2013.  FINDINGS: There is irregular fullness of the left hilum with adjacent coarse reticular opacity extends laterally and to the left lung base. There is also coarse irregular scarring in both upper lobes. The lungs are hyperexpanded. Vague nodule is noted in the right upper lobe. No pleural effusion or pneumothorax.  Cardiac silhouette is normal in size. Mediastinum and right hilum are unremarkable.  IMPRESSION: No acute findings. No change from the prior chest radiograph allowing for differences in patient positioning.  Left hilar fullness and perihilar and left lung base scarring, stable. Other areas of lung scarring and the right upper lobe nodule are also stable.   Electronically Signed   By: Lajean Manes M.D.   On: 06/26/2013 17:05    Microbiology: Recent Results (from the past 240 hour(s))  CLOSTRIDIUM DIFFICILE BY PCR     Status: None   Collection Time    06/28/13 10:00 AM      Result Value Range Status   C difficile by pcr NEGATIVE  NEGATIVE Final   Comment: Performed at [facility]: Basic Metabolic Panel:  Recent Labs Lab 06/26/13 1632 06/27/13 0418 06/28/13 0419 06/30/13 0405  NA 136 133* 135 136*  K 4.3 3.2* 3.9 3.8  CL 101 97 99 100  CO2 23 23 27 27   GLUCOSE 101* 113* 83 71  BUN 8 7 9 8   CREATININE 0.72 0.76 1.01 1.00  CALCIUM 10.6* 10.0 10.2 9.5  MG  --   --  1.9  --     Liver Function Tests: No results found for this basename: AST, ALT, ALKPHOS, BILITOT, PROT, ALBUMIN,  in the last 168 hours No results found for this basename: LIPASE, AMYLASE,  in the last 168 hours No results found for this basename: AMMONIA,  in the last 168 hours CBC:  Recent Labs Lab 06/26/13 1632 06/27/13 0418 06/30/13 0405  WBC 8.4 6.6 5.1  HGB 14.1 12.4 11.0*  HCT 42.5 38.5 35.4*  MCV 89.3 89.5 91.7  PLT 452* 429* 334   Cardiac Enzymes:  Recent Labs Lab 06/26/13 1632 06/26/13 2128 06/27/13 0418 06/27/13 1035  TROPONINI <0.30 <0.30 <0.30 <0.30   BNP: BNP (last 3 results)  Recent Labs  04/24/13 1029 05/10/13 1707 06/26/13 1632  PROBNP 435.7 267.10 155.8   CBG: No results found for this basename: GLUCAP,  in the last 168 hours  Time coordinating discharge: Over 30 minutes

## 2013-07-02 NOTE — Progress Notes (Signed)
Pt for discharge to [place] at [facility].  CSW facilitated pt discharge needs including contacting facility, faxing pt discharge information via [facility], discussing with pt at bedside and pt daughter via telephone, providing RN phone number to call report, and pt daughter plans to transport via private vehicle to [place] at [place]. Discharge packet provided at bedside in order for pt daughter to provide to [place] at [place] upon arrival.   No further social work needs identified at this time.  CSW signing off.   Drake Leach, MSW, [facility] [facility] [phone]

## 2013-07-02 NOTE — Discharge Instructions (Signed)
Orthostatic Hypotension Orthostatic hypotension is a sudden fall in blood pressure. It occurs when a person goes from a sitting or lying position to a standing position. CAUSES   Loss of body fluids (dehydration).  Medicines that lower blood pressure.  Sudden changes in posture, such as sudden standing when you have been sitting or lying down.  Taking too much of your medicine. SYMPTOMS   Lightheadedness or dizziness.  Fainting or near-fainting.  A fast heart rate (tachycardia).  Weakness.  Feeling tired (fatigue). DIAGNOSIS  Your caregiver may find the cause of orthostatic hypotension through:  A history and/or physical exam.  Checking your blood pressure. Your caregiver will check your blood pressure when you are:  Lying down.  Sitting.  Standing.  Tilt table testing. In this test, you are placed on a table that goes from a lying position to a standing position. You will be strapped to the table. This test helps to monitor your blood pressure and heart rate when you are in different positions. TREATMENT   If orthostatic hypotension is caused by your medicines, your caregiver will need to adjust your dosage. Do not stop or adjust your medicine on your own.  When changing positions, make these changes slowly. This allows your body to adjust to the different position.  Compression stockings that are worn on your lower legs may be helpful.  Your caregiver may have you consume extra salt. Do not add extra salt to your diet unless directed by your caregiver.  Eat frequent, small meals. Avoid sudden standing after eating.  Avoid hot showers or excessive heat.  Your caregiver may give you fluids through the vein (intravenous).  Your caregiver may put you on medicine to help enhance fluid retention. SEEK IMMEDIATE MEDICAL CARE IF:   You faint or have a near-fainting episode. Call your local emergency services (911 in [place]  You have or develop chest pain.  You  feel sick to your stomach (nauseous) or vomit.  You have a loss of feeling or movement in your arms or legs.  You have difficulty talking, slurred speech, or you are unable to talk.  You have difficulty thinking or have confused thinking. MAKE SURE YOU:   Understand these instructions.  Will watch your condition.  Will get help right away if you are not doing well or get worse. Document Released: 06/07/2002 Document Revised: 09/09/2011 Document Reviewed: 09/30/2008 [facility] Patient Information 2014 [place], [place].

## 2013-07-07 ENCOUNTER — Telehealth: Payer: Self-pay | Admitting: Radiation Oncology

## 2013-07-07 ENCOUNTER — Ambulatory Visit
Admission: RE | Admit: 2013-07-07 | Discharge: 2013-07-07 | Disposition: A | Discharge status: Home / Self Care | Payer: Medicare Other | Source: Ambulatory Visit | Attending: Radiation Oncology | Admitting: Radiation Oncology

## 2013-07-07 NOTE — Progress Notes (Signed)
  Radiation Oncology         [phone] ________________________________  Name: Jodi Frazier MRN: [id number]  Date: 07/07/2013  DOB: 30-May-1931  No Show, Hospitalized    -----------------------------------------------  [name]. Tammi Klippel, M.D.

## 2013-07-07 NOTE — Telephone Encounter (Signed)
Patient did not show for simulation. Phoned home contact number listed but, no answer. Left message requesting return call to reschedule.

## 2013-07-12 ENCOUNTER — Telehealth: Payer: Self-pay | Admitting: Radiation Oncology

## 2013-07-12 NOTE — Telephone Encounter (Signed)
Patient did not show for SBRT simulation on 1/7. Saw today that the patient has not been rescheduled. Per Aaron Edelman, RT none of the simulation staff has been able to reach her. Called patient to attempt to reschedule. Patient requested to call back because "she was with a customer." Encouraged her to do so. Then, the patient said, "i won't be calling back because I don't plan to have any radiation." Notified Dr. Tammi Klippel and Aaron Edelman, RT of this finding.

## 2013-07-17 ENCOUNTER — Emergency Department (HOSPITAL_COMMUNITY): Payer: Medicare Other

## 2013-07-17 ENCOUNTER — Emergency Department (HOSPITAL_COMMUNITY)
Admission: EM | Admit: 2013-07-17 | Discharge: 2013-07-17 | Disposition: A | Discharge status: Home / Self Care | Payer: Medicare Other | Attending: Emergency Medicine | Admitting: Emergency Medicine

## 2013-07-17 ENCOUNTER — Encounter (HOSPITAL_COMMUNITY): Payer: Self-pay | Admitting: Emergency Medicine

## 2013-07-17 DIAGNOSIS — I2510 Atherosclerotic heart disease of native coronary artery without angina pectoris: Secondary | ICD-10-CM | POA: Insufficient documentation

## 2013-07-17 DIAGNOSIS — Z9861 Coronary angioplasty status: Secondary | ICD-10-CM | POA: Insufficient documentation

## 2013-07-17 DIAGNOSIS — Z79899 Other long term (current) drug therapy: Secondary | ICD-10-CM | POA: Insufficient documentation

## 2013-07-17 DIAGNOSIS — K219 Gastro-esophageal reflux disease without esophagitis: Secondary | ICD-10-CM | POA: Insufficient documentation

## 2013-07-17 DIAGNOSIS — J4489 Other specified chronic obstructive pulmonary disease: Secondary | ICD-10-CM | POA: Insufficient documentation

## 2013-07-17 DIAGNOSIS — I10 Essential (primary) hypertension: Secondary | ICD-10-CM | POA: Insufficient documentation

## 2013-07-17 DIAGNOSIS — R112 Nausea with vomiting, unspecified: Secondary | ICD-10-CM | POA: Insufficient documentation

## 2013-07-17 DIAGNOSIS — Z792 Long term (current) use of antibiotics: Secondary | ICD-10-CM | POA: Insufficient documentation

## 2013-07-17 DIAGNOSIS — IMO0002 Reserved for concepts with insufficient information to code with codable children: Secondary | ICD-10-CM | POA: Insufficient documentation

## 2013-07-17 DIAGNOSIS — E7800 Pure hypercholesterolemia, unspecified: Secondary | ICD-10-CM | POA: Insufficient documentation

## 2013-07-17 DIAGNOSIS — Z87442 Personal history of urinary calculi: Secondary | ICD-10-CM | POA: Insufficient documentation

## 2013-07-17 DIAGNOSIS — E039 Hypothyroidism, unspecified: Secondary | ICD-10-CM | POA: Insufficient documentation

## 2013-07-17 DIAGNOSIS — Z8669 Personal history of other diseases of the nervous system and sense organs: Secondary | ICD-10-CM | POA: Insufficient documentation

## 2013-07-17 DIAGNOSIS — J449 Chronic obstructive pulmonary disease, unspecified: Secondary | ICD-10-CM | POA: Insufficient documentation

## 2013-07-17 DIAGNOSIS — Z8701 Personal history of pneumonia (recurrent): Secondary | ICD-10-CM | POA: Insufficient documentation

## 2013-07-17 DIAGNOSIS — R42 Dizziness and giddiness: Secondary | ICD-10-CM | POA: Insufficient documentation

## 2013-07-17 DIAGNOSIS — F3289 Other specified depressive episodes: Secondary | ICD-10-CM | POA: Insufficient documentation

## 2013-07-17 DIAGNOSIS — I252 Old myocardial infarction: Secondary | ICD-10-CM | POA: Insufficient documentation

## 2013-07-17 DIAGNOSIS — Z87891 Personal history of nicotine dependence: Secondary | ICD-10-CM | POA: Insufficient documentation

## 2013-07-17 DIAGNOSIS — Z7982 Long term (current) use of aspirin: Secondary | ICD-10-CM | POA: Insufficient documentation

## 2013-07-17 DIAGNOSIS — Z791 Long term (current) use of non-steroidal anti-inflammatories (NSAID): Secondary | ICD-10-CM | POA: Insufficient documentation

## 2013-07-17 DIAGNOSIS — Z85118 Personal history of other malignant neoplasm of bronchus and lung: Secondary | ICD-10-CM | POA: Insufficient documentation

## 2013-07-17 DIAGNOSIS — Z923 Personal history of irradiation: Secondary | ICD-10-CM | POA: Insufficient documentation

## 2013-07-17 DIAGNOSIS — F329 Major depressive disorder, single episode, unspecified: Secondary | ICD-10-CM | POA: Insufficient documentation

## 2013-07-17 DIAGNOSIS — F0390 Unspecified dementia without behavioral disturbance: Secondary | ICD-10-CM | POA: Insufficient documentation

## 2013-07-17 DIAGNOSIS — M129 Arthropathy, unspecified: Secondary | ICD-10-CM | POA: Insufficient documentation

## 2013-07-17 LAB — CBC WITH DIFFERENTIAL/PLATELET
Basophils Absolute: 0.1 10*3/uL (ref 0.0–0.1)
Basophils Relative: 1 % (ref 0–1)
Eosinophils Absolute: 0.1 10*3/uL (ref 0.0–0.7)
Eosinophils Relative: 2 % (ref 0–5)
HEMATOCRIT: 40.9 % (ref 36.0–46.0)
HEMOGLOBIN: 13.8 g/dL (ref 12.0–15.0)
LYMPHS ABS: 1 10*3/uL (ref 0.7–4.0)
LYMPHS PCT: 14 % (ref 12–46)
MCH: 29.7 pg (ref 26.0–34.0)
MCHC: 33.7 g/dL (ref 30.0–36.0)
MCV: 88.1 fL (ref 78.0–100.0)
Monocytes Absolute: 0.5 10*3/uL (ref 0.1–1.0)
Monocytes Relative: 6 % (ref 3–12)
Neutro Abs: 5.9 10*3/uL (ref 1.7–7.7)
Neutrophils Relative %: 78 % — ABNORMAL HIGH (ref 43–77)
Platelets: 334 10*3/uL (ref 150–400)
RBC: 4.64 MIL/uL (ref 3.87–5.11)
RDW: 14.5 % (ref 11.5–15.5)
WBC: 7.6 10*3/uL (ref 4.0–10.5)

## 2013-07-17 LAB — URINALYSIS, ROUTINE W REFLEX MICROSCOPIC
BILIRUBIN URINE: NEGATIVE
GLUCOSE, UA: NEGATIVE mg/dL
Hgb urine dipstick: NEGATIVE
Ketones, ur: NEGATIVE mg/dL
LEUKOCYTES UA: NEGATIVE
Nitrite: NEGATIVE
PH: 7 (ref 5.0–8.0)
Protein, ur: NEGATIVE mg/dL
Specific Gravity, Urine: 1.014 (ref 1.005–1.030)
Urobilinogen, UA: 0.2 mg/dL (ref 0.0–1.0)

## 2013-07-17 LAB — POCT I-STAT, CHEM 8
BUN: 13 mg/dL (ref 6–23)
CALCIUM ION: 1.41 mmol/L — AB (ref 1.13–1.30)
CHLORIDE: 105 meq/L (ref 96–112)
Creatinine, Ser: 1.1 mg/dL (ref 0.50–1.10)
Glucose, Bld: 108 mg/dL — ABNORMAL HIGH (ref 70–99)
HCT: 45 % (ref 36.0–46.0)
Hemoglobin: 15.3 g/dL — ABNORMAL HIGH (ref 12.0–15.0)
Potassium: 4.4 mEq/L (ref 3.7–5.3)
Sodium: 139 mEq/L (ref 137–147)
TCO2: 25 mmol/L (ref 0–100)

## 2013-07-17 MED ORDER — MECLIZINE HCL 50 MG PO TABS: 25.0000 mg | ORAL_TABLET | Freq: Three times a day (TID) | ORAL | Status: DC | PRN

## 2013-07-17 MED ORDER — ONDANSETRON HCL 4 MG/2ML IJ SOLN: 4.0000 mg | Freq: Once | INTRAMUSCULAR | Status: DC

## 2013-07-17 MED ORDER — MECLIZINE HCL 25 MG PO TABS
25.0000 mg | ORAL_TABLET | Freq: Once | ORAL | Status: AC
  Administered 2013-07-17: 25 mg via ORAL
  Filled 2013-07-17: qty 1

## 2013-07-17 MED ORDER — ONDANSETRON HCL 4 MG PO TABS
4.0000 mg | ORAL_TABLET | Freq: Once | ORAL | Status: AC
  Administered 2013-07-17: 4 mg via ORAL
  Filled 2013-07-17: qty 1

## 2013-07-17 NOTE — ED Notes (Signed)
Report called to [place]. PTAR at bedside to transport PT.

## 2013-07-17 NOTE — Discharge Instructions (Signed)
Dizziness Stop Vistaril. Take the medication prescribed instead as needed for dizziness. Call Jodi Frazier to schedule an office visit if not improved by next week   Dizziness means you feel unsteady or lightheaded. You might feel like you are going to pass out (faint). HOME CARE   Drink enough fluids to keep your pee (urine) clear or pale yellow.  Take your medicines exactly as told by your doctor. If you take blood pressure medicine, always stand up slowly from the lying or sitting position. Hold on to something to steady yourself.  If you need to stand in one place for a long time, move your legs often. Tighten and relax your leg muscles.  Have someone stay with you until you feel okay.  Do not drive or use heavy machinery if you feel dizzy.  Do not drink alcohol. GET HELP RIGHT AWAY IF:   You feel dizzy or lightheaded and it gets worse.  You feel sick to your stomach (nauseous), or you throw up (vomit).  You have trouble talking or walking.  You feel weak or have trouble using your arms, hands, or legs.  You cannot think clearly or have trouble forming sentences.  You have chest pain, belly (abdominal) pain, sweating, or you are short of breath.  Your vision changes.  You are bleeding.  You have problems from your medicine that seem to be getting worse. MAKE SURE YOU:   Understand these instructions.  Will watch your condition.  Will get help right away if you are not doing well or get worse. Document Released: 06/06/2011 Document Revised: 09/09/2011 Document Reviewed: 06/06/2011 [facility] Patient Information 2014 [place], [place].
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
## 2013-07-17 NOTE — ED Provider Notes (Signed)
CSN: [id number]     Arrival date & time 07/17/13  1345 History   None    Chief Complaint  Patient presents with  . Dizziness  . Nausea  . Emesis   over 5 caveat dementia history is obtained from patient's daughter and from old records (Consider location/radiation/quality/duration/timing/severity/associated sxs/prior Treatment) HPI  Patient complains of dizziness i.e. sensation of room spinning worse when she moves her head for approximately the past 3 days interfering with her ability to walk. She's also had some intermittent nausea and vomited today. She also complained of a headache earlier today. However no headache now. She denies nausea presently. His symptoms are worse when she moves her head improves when she lies perfectly still. She denies lightheadedness . Past Medical History  Diagnosis Date  . Hypertension   . Colitis   . Coronary artery disease   . High cholesterol     takes Red Yeast Rice and Fish OIl daily  . Kidney stones   . PONV (postoperative nausea and vomiting)   . MI (myocardial infarction) 2010  . Shortness of breath     with exertion  . Pneumonia     hx of 20+yrs ago  . COPD (chronic obstructive pulmonary disease)     pleurisy or COPD exascerbation > 73yr ago  . Headache(784.0)   . Vertigo     HTN related bc gets up too fast  . Arthritis   . Bruises easily   . History of shingles 20yrs ago    in eye;occasionally gets some residual   . Accessory skin tags     arms/legs  . GERD (gastroesophageal reflux disease)   . Colitis   . Diverticulosis   . Urinary frequency   . Stress incontinence   . History of kidney stones   . Hypothyroidism     takes sYnthroid daily  . Cataract     early stage on right  . Macular degeneration, wet   . Depression     some but takes Amitryptylline nightly  . Right-sided chest wall pain 08/28/12    PRESENTATION - RIGHT SIDED ANTERIOR CHEST PAIN  . Weight loss 08/28/12    10 LB WEIGHT LOSS OVER 3 MONTHS  . Lung mass  08/12/12    CHEST-XRAY/ PET -LOBULAR MASS LLL - 4.1 X 3.6 X 4.2 CM  . Dementia     mild  . S/P radiation therapy  10/05/2012-11/20/2012    Left Lower Lung and hilum / 70 Gy in 35 fractions  . Adenocarcinoma of lung 09/07/12    needle core bx-LLL-adenocarcinoma  . Hypertension    Past Surgical History  Procedure Laterality Date  . Hip arthroscopy  69yr ago    right hip-replacement  . Knee arthroscopy      LT  . Total knee arthroplasty  68yrs ago    left  . Appendectomy  79yrs ago  . Abdominal hysterectomy  77yrs ago   . Lithotripsy  2011  . Cataract surgery  2012    left  . Eye surgery  36yrs ago  . Bladder surgery  2001    tacked  . Coronary angioplasty with stent placement  2010    2 in rt coronary artery and 1 in another spot  . Colonoscopy    . Esophagogastroduodenoscopy    . Total knee arthroplasty  09/09/2011    Procedure: TOTAL KNEE ARTHROPLASTY;  Surgeon: Rudean Haskell, MD;  Location: [facility];  Service: Orthopedics;  Laterality: Right;  Right Total Knee  Arthroplasty  . Bronchial brush biopsy Left 08/17/12    LLL Bronchial Washing / Brushing and Bronchial Biopsy: Negative for malignancy  . Left lower lobe needle core biopsy Left 09/07/12    Poorly Differentiated Adenocarcinoma   . Joint replacement     Family History  Problem Relation Age of Onset  . Anesthesia problems Mother   . Hypotension Neg Hx   . Malignant hyperthermia Neg Hx   . Pseudochol deficiency Neg Hx   . Cancer Maternal Aunt     breast   History  Substance Use Topics  . Smoking status: Former Smoker -- 1.00 packs/day for 40 years    Types: Cigarettes    Quit date: 08/26/2002  . Smokeless tobacco: Never Used     Comment: quit several yrs ago  . Alcohol Use: Yes     Comment: 2 times week alcohol   OB History   Grav Para Term Preterm Abortions TAB SAB Ect Mult Living                 Review of Systems  Unable to perform ROS: Dementia    Allergies  Codeine and Augmentin  Home Medications    Current Outpatient Rx  Name  Route  Sig  Dispense  Refill  . acetaminophen (TYLENOL) 325 MG tablet   Oral   Take 650 mg by mouth every 6 (six) hours as needed.         Marland Kitchen albuterol (PROVENTIL HFA;VENTOLIN HFA) 108 (90 BASE) MCG/ACT inhaler   Inhalation   Inhale 2 puffs into the lungs every 6 (six) hours as needed for wheezing.   1 Inhaler   3   . ALPRAZolam (XANAX) 0.25 MG tablet   Oral   Take 1 tablet (0.25 mg total) by mouth 2 (two) times daily as needed for anxiety.   30 tablet   0   . amitriptyline (ELAVIL) 25 MG tablet   Oral   Take 1 tablet (25 mg total) by mouth at bedtime.   30 tablet   0   . amLODipine (NORVASC) 2.5 MG tablet   Oral   Take 2.5 mg by mouth daily.         Marland Kitchen aspirin EC 81 MG tablet   Oral   Take 81 mg by mouth daily.         . budesonide-formoterol (SYMBICORT) 160-4.5 MCG/ACT inhaler   Inhalation   Inhale 2 puffs into the lungs 2 (two) times daily.         . carvedilol (COREG) 6.25 MG tablet   Oral   Take 6.25 mg by mouth 2 (two) times daily with a meal.          . cloNIDine (CATAPRES) 0.1 MG tablet   Oral   Take 0.1 mg by mouth 2 (two) times daily.         . Cyanocobalamin (VITAMIN B-12 IJ)   Injection   Inject 1,000 mcg/mL as directed every 30 (thirty) days.         . cycloSPORINE (RESTASIS) 0.05 % ophthalmic emulsion   Both Eyes   Place 1 drop into both eyes 2 (two) times daily.          Marland Kitchen donepezil (ARICEPT) 10 MG tablet   Oral   Take 15 mg by mouth at bedtime. Takes 1 and 1/2         . escitalopram (LEXAPRO) 10 MG tablet   Oral   Take 10 mg by mouth daily.         Marland Kitchen  HYDROcodone-acetaminophen (NORCO/VICODIN) 5-325 MG per tablet   Oral   Take 1-2 tablets by mouth every 6 (six) hours as needed for moderate pain.   30 tablet   0   . hydrocortisone cream 1 %   Topical   Apply topically 2 (two) times daily.   30 g   0   . hydrOXYzine (ATARAX/VISTARIL) 25 MG tablet   Oral   Take 1 tablet (25 mg  total) by mouth 3 (three) times daily.   90 tablet   0   . lactobacillus acidophilus (BACID) TABS tablet   Oral   Take 1 tablet by mouth 2 (two) times daily. For 7 days         . levothyroxine (SYNTHROID) 125 MCG tablet   Oral   Take 1 tablet (125 mcg total) by mouth daily before breakfast.   30 tablet   2   . mesalamine (LIALDA) 1.2 G EC tablet   Oral   Take 1.2 g by mouth 2 (two) times daily.          . midodrine (PROAMATINE) 2.5 MG tablet   Oral   Take 1 tablet (2.5 mg total) by mouth 2 (two) times daily with a meal.   60 tablet   0   . naproxen sodium (ANAPROX) 220 MG tablet   Oral   Take 220 mg by mouth 2 (two) times daily as needed (pain).         . nitroGLYCERIN (NITROSTAT) 0.4 MG SL tablet   Sublingual   Place 0.4 mg under the tongue every 5 (five) minutes as needed for chest pain.          Marland Kitchen ondansetron (ZOFRAN) 4 MG tablet   Oral   Take 1 tablet (4 mg total) by mouth every 8 (eight) hours as needed for nausea or vomiting.   20 tablet   0   . pantoprazole (PROTONIX) 40 MG tablet   Oral   Take 40 mg by mouth every morning.          . sulfamethoxazole-trimethoprim (BACTRIM DS,SEPTRA DS) 800-160 MG per tablet   Oral   Take 1 tablet by mouth 2 (two) times daily.          BP 179/73  Pulse 67  Temp(Src) 97.4 F (36.3 C) (Oral)  Resp 23  Wt 172 lb (78.019 kg)  SpO2 96% Physical Exam  Nursing note and vitals reviewed. Constitutional: She appears well-developed and well-nourished.  HENT:  Head: Normocephalic and atraumatic.  Eyes: Conjunctivae are normal. Pupils are equal, round, and reactive to light.  Neck: Neck supple. No tracheal deviation present. No thyromegaly present.  Cardiovascular: Normal rate and regular rhythm.   No murmur heard. Pulmonary/Chest: Effort normal and breath sounds normal.  Abdominal: Soft. Bowel sounds are normal. She exhibits no distension. There is no tenderness.  Musculoskeletal: Normal range of motion. She  exhibits no edema and no tenderness.  Neurological: She is alert. She displays normal reflexes. No cranial nerve deficit. Coordination normal.  Becomes vertiginous upon sitting up from a supine position she is not lightheaded upon sitting up. Finger to nose normal heel shin normal DTRs symmetric bilaterally knee jerk ankle jerk biceps toes are bilaterally  Skin: Skin is warm and dry. No rash noted.  Psychiatric: She has a normal mood and affect.    ED Course  Procedures (including critical care time) Labs Review Labs Reviewed - No data to display Imaging Review No results found.  EKG Interpretation  Date/Time:  Saturday July 17 2013 13:54:28 EST Ventricular Rate:  67 PR Interval:  179 QRS Duration: 81 QT Interval:  414 QTC Calculation: 437 R Axis:   84 Text Interpretation:  Sinus rhythm Consider left atrial enlargement Borderline right axis deviation No significant change since last tracing Confirmed by Winfred Leeds  MD, Ricke Kimoto ([id number]) on 07/17/2013 3:16:43 PM           Results for orders placed during the hospital encounter of 07/17/13  POCT I-STAT, CHEM 8      Result Value Range   Sodium 139  137 - 147 mEq/L   Potassium 4.4  3.7 - 5.3 mEq/L   Chloride 105  96 - 112 mEq/L   BUN 13  6 - 23 mg/dL   Creatinine, Ser 1.10  0.50 - 1.10 mg/dL   Glucose, Bld 108 (*) 70 - 99 mg/dL   Calcium, Ion 1.41 (*) 1.13 - 1.30 mmol/L   TCO2 25  0 - 100 mmol/L   Hemoglobin 15.3 (*) 12.0 - 15.0 g/dL   HCT 45.0  36.0 - 46.0 %   Dg Chest 2 View  07/17/2013   CLINICAL DATA:  Simona Huh. DIZZINESS NAUSEA EMESIS  EXAM: CHEST  2 VIEW  COMPARISON:  CT ANGIO CHEST W/CM &/OR WO/CM dated 06/26/2013; DG CHEST 1V PORT dated 06/26/2013; NM PET IMAGE RESTAG (PS) SKULL BASE TO THIGH dated 06/21/2013  FINDINGS: Stable cardiac silhouette. There is perihilar scarring and consolidation surrounding the left hilum stable compared to prior. There is volume loss in the left hemi thorax again demonstrated. Left effusion  is slightly improved. Nodules in the right lung are difficult to identified.  IMPRESSION: 1. Improvement in left pleural effusion. 2. Left perihilar mass and scarring is not changed from prior.   Electronically Signed   By: Suzy Bouchard M.D.   On: 07/17/2013 16:21   Ct Angio Chest Pe W/cm &/or Wo Cm  06/26/2013   CLINICAL DATA:  History of adenocarcinoma of the lung treated with radiation therapy. Left-sided chest pain.  EXAM: CT ANGIOGRAPHY CHEST WITH CONTRAST  TECHNIQUE: Multidetector CT imaging of the chest was performed using the standard protocol during bolus administration of intravenous contrast. Multiplanar CT image reconstructions including MIPs were obtained to evaluate the vascular anatomy.  CONTRAST:  16mL OMNIPAQUE IOHEXOL 350 MG/ML SOLN  COMPARISON:  Chest CT, 06/01/2013  FINDINGS: No evidence of a pulmonary embolus.  Moderate pleural effusion on the left has increased from the prior study. There is an 18 mm by 16 mm mass posterior to the inferior left hilum, without change. Abnormal soft tissue surrounds the left hilar structures, also stable. There are additional areas of coarse reticular opacity in the left lung which are also stable, likely scarring. A few tiny reticular nodular densities are noted posteriorly in the left lower lobe and left upper lobe.  The 11 mm mass in the right upper lobe, posterior laterally, stable. There are other areas of reticular opacity there are mostly stable. There are subtle tree in bud type opacities in the posterior right lower lobe, which are new. There are no focal areas of consolidation. Posterior and apical upper lobe scarring is stable. No right pleural effusion.  The heart is normal in size. The great vessels are normal in caliber. The largest mediastinal node is a 10.7 cm precarinal lymph node. This is stable as are the remaining sub cm nodes. No right hilar mass or adenopathy.  Limited evaluation of the upper abdomen shows a partly imaged left  renal  low-density lesion likely a cyst. It is stable. No adrenal masses.  The bones are demineralized. There are no osteoblastic or osteolytic lesions.  Review of the MIP images confirms the above findings.  IMPRESSION: 1. No evidence of a pulmonary embolism. 2. Changes from treatment of left lung carcinoma are without significant change. There is abnormal soft tissue with associated coarse reticular opacity centered on the left hilum extending into the left lung, which is all stable consistent with post treatment related scarring. There is also a discrete mass in the left lower lobe posterior to the left hilum that is stable. 3. Small mass in the right upper lobe is stable. No new lung masses. 4. The left pleural effusion has increased, now moderate size. 5. There are few subtle tree in bud type opacities in the right lower lobe which are new. This is nonspecific. It may reflect a small area of bronchiolitis. No other change from the prior study.   Electronically Signed   By: Lajean Manes M.D.   On: 06/26/2013 18:38   Nm Pet Image Restag (ps) Skull Base To Thigh  06/21/2013   CLINICAL DATA:  Subsequent treatment strategy for lung cancer lung cancer with new solitary pulmonary,.  EXAM: NUCLEAR MEDICINE PET SKULL BASE TO THIGH  FASTING BLOOD GLUCOSE:  Value: 91mg /dl  TECHNIQUE: 17.6 mCi F-18 FDG was injected intravenously. CT data was obtained and used for attenuation correction and anatomic localization only. (This was not acquired as a diagnostic CT examination.) Additional exam technical data entered on technologist worksheet.  COMPARISON:  CT ABD/PELVIS W CM dated 06/01/2013  FINDINGS: NECK  No hypermetabolic lymph nodes in the neck.  CHEST  The right upper lobe pulmonary nodule of concern measuring 12 mm (image 78, series 2) does have associated metabolic activity with SUV max 3.8 consistent with neoplasm. The left lower lobe pulmonary nodule measuring 20 mm (image 91, series 2) does not have significant metabolic  activity; however, there is intense metabolic activity and fullness in the left suprahilar region (image 88)with SUV max = 7.0. There is a focus of metabolic activity within the medial aspect the lingula which could represent postobstructive pneumonitis or malignancy (SUV max 6.7 image 69).  There is mild metabolic nodularity within the periphery of the right upper lobe (image 60) which is indeterminate. Metabolic activity within the thyroid gland is likely related to thyroiditis.  ABDOMEN/PELVIS  No abnormal hypermetabolic activity within the liver, pancreas, adrenal glands, or spleen. No hypermetabolic lymph nodes in the abdomen or pelvis. No metabolic activity associated with this cystic lesion the right adnexa. Scattered foci of uptake noted in the sigmoid colon likely related to diverticulosis.  SKELETON  No focal hypermetabolic activity to suggest skeletal metastasis.  IMPRESSION: 1. Hypermetabolic right upper lobe pulmonary nodule consistent with either primary bronchogenic carcinoma or metastasis. 2. New left suprahilar metabolic tissue surrounding the upper lobe bronchus is most consistent with lung cancer recurrence. 3. Focus of hypermetabolic consolidation in the lingula either represents a neoplasm versus postobstructive pneumonitis. Favor the latter. 4. Metabolic activity associated with nodular pleural parenchymal thickening at the right lung apex is indeterminate and may be inflammatory. 5. The left lower lobe nodule is not significant hypermetabolic.   Electronically Signed   By: Suzy Bouchard M.D.   On: 06/21/2013 10:48   Dg Chest Port 1 View  06/26/2013   CLINICAL DATA:  Left-sided chest pain.  EXAM: PORTABLE CHEST - 1 VIEW  COMPARISON:  Chest CT, 06/01/2013 and chest  radiographs, 05/12/2013.  FINDINGS: There is irregular fullness of the left hilum with adjacent coarse reticular opacity extends laterally and to the left lung base. There is also coarse irregular scarring in both upper lobes.  The lungs are hyperexpanded. Vague nodule is noted in the right upper lobe. No pleural effusion or pneumothorax.  Cardiac silhouette is normal in size. Mediastinum and right hilum are unremarkable.  IMPRESSION: No acute findings. No change from the prior chest radiograph allowing for differences in patient positioning.  Left hilar fullness and perihilar and left lung base scarring, stable. Other areas of lung scarring and the right upper lobe nodule are also stable.   Electronically Signed   By: Lajean Manes M.D.   On: 06/26/2013 17:05    4:30 PM patient resting comfortably. Patient signed out to Dr. [id number] pm who will check laboratory work MDM  No diagnosis found. Strongly suspect vertigo as etiology of vomiting. Plan discontinue Vistaril, prescription meclizine. F/u Elyn Aquas Dx vertigo  Orlie Dakin, MD 07/17/13 [phone]

## 2013-07-17 NOTE — ED Notes (Signed)
Pt currently living at [facility] for Physical Therapy. Pt has a 3 week Hx of dizziness . Pt does report N/V today this new.

## 2013-07-27 ENCOUNTER — Telehealth: Payer: Self-pay | Admitting: Physician Assistant

## 2013-07-27 ENCOUNTER — Other Ambulatory Visit: Payer: Self-pay | Admitting: Physician Assistant

## 2013-07-27 DIAGNOSIS — I10 Essential (primary) hypertension: Secondary | ICD-10-CM

## 2013-07-27 DIAGNOSIS — F419 Anxiety disorder, unspecified: Secondary | ICD-10-CM

## 2013-07-27 DIAGNOSIS — F329 Major depressive disorder, single episode, unspecified: Secondary | ICD-10-CM

## 2013-07-27 DIAGNOSIS — R42 Dizziness and giddiness: Secondary | ICD-10-CM

## 2013-07-27 DIAGNOSIS — E039 Hypothyroidism, unspecified: Secondary | ICD-10-CM

## 2013-07-27 DIAGNOSIS — K219 Gastro-esophageal reflux disease without esophagitis: Secondary | ICD-10-CM

## 2013-07-27 DIAGNOSIS — I951 Orthostatic hypotension: Secondary | ICD-10-CM

## 2013-07-27 DIAGNOSIS — F32A Depression, unspecified: Secondary | ICD-10-CM

## 2013-07-27 DIAGNOSIS — F0390 Unspecified dementia without behavioral disturbance: Secondary | ICD-10-CM

## 2013-07-27 MED ORDER — MIDODRINE HCL 2.5 MG PO TABS: 2.5000 mg | ORAL_TABLET | Freq: Two times a day (BID) | ORAL | Status: DC

## 2013-07-27 MED ORDER — LEVOTHYROXINE SODIUM 125 MCG PO TABS: 125.0000 ug | ORAL_TABLET | Freq: Every day | ORAL | Status: DC

## 2013-07-27 MED ORDER — CARVEDILOL 6.25 MG PO TABS: 6.2500 mg | ORAL_TABLET | Freq: Two times a day (BID) | ORAL | Status: DC

## 2013-07-27 MED ORDER — AMLODIPINE BESYLATE 2.5 MG PO TABS: 2.5000 mg | ORAL_TABLET | Freq: Every day | ORAL | Status: DC

## 2013-07-27 MED ORDER — MECLIZINE HCL 50 MG PO TABS: 25.0000 mg | ORAL_TABLET | Freq: Three times a day (TID) | ORAL | Status: DC | PRN

## 2013-07-27 MED ORDER — DONEPEZIL HCL 10 MG PO TABS: 15.0000 mg | ORAL_TABLET | Freq: Every day | ORAL | Status: DC

## 2013-07-27 MED ORDER — ESCITALOPRAM OXALATE 10 MG PO TABS: 10.0000 mg | ORAL_TABLET | Freq: Every day | ORAL | Status: DC

## 2013-07-27 MED ORDER — PANTOPRAZOLE SODIUM 40 MG PO TBEC: 40.0000 mg | DELAYED_RELEASE_TABLET | Freq: Every morning | ORAL | Status: DC

## 2013-07-27 NOTE — Telephone Encounter (Signed)
Notified pt's daughter. She states the nursing home went ahead and called in a 30 day supply of pt's medications to the [facility] and she was going to call [facility] and cancel the refills we just sent as they want to use [facility].  Advised her to as [facility] to transfer the rxs and [facility] can place them on hold until pt needs them. She voices understanding.

## 2013-07-27 NOTE — Telephone Encounter (Signed)
Please inform patient and patient's daughter Job Founds) that home medications have been sent to the [facility] she has listed in our system.

## 2013-08-02 NOTE — Progress Notes (Addendum)
Thoracic Location of Tumor / Histology: Right Upper Lobe  Patient presented  In March of 2014 with Poorly differentiated Adenocarcinoma of left lower lobe and now has recurrence to her right upper lobe seen on PET Scan from 06/21/13  Biopsies of (if applicable) revealed: None for the left lower lobe  09/07/12 Diagnosis Lung, needle/core biopsy(ies), LLL - POORLY DIFFERENTIATED ADENOCARCINOMA  1. Hypermetabolic right upper lobe pulmonary nodule consistent with  either primary bronchogenic carcinoma or metastasis.  2. New left suprahilar metabolic tissue surrounding the upper lobe bronchus is most consistent with lung cancer recurrence.  3. Focus of hypermetabolic consolidation in the lingula either represents a neoplasm versus postobstructive pneumonitis. Favor the latter.  4. Metabolic activity associated with nodular pleural parenchymal thickening at the right lung apex is indeterminate and may be  inflammatory.  5. The left lower lobe nodule is not significant hypermetabolic.    Tobacco/Marijuana/Snuff/ETOH use: reports that she quit smoking about 10 years ago. Her smoking use included Cigarettes. She has a 40 pack-year smoking history. She has never used smokeless tobacco. She reports that she drinks alcohol. She reports that she does not use illicit drugs.   Past/Anticipated interventions by cardiothoracic surgery, if any: None  Past/Anticipated interventions by medical oncology, if any:  Signs/Symptoms  Weight changes, if any: has lost 12 lbs since January  Respiratory complaints, if any: SOB  When ambulating  Hemoptysis, if any: None  Pain issues, if any:   Rehabilitation 2 days weekly - Risk to fall has experienced 5 falls the last in Early December  SAFETY ISSUES:  Prior radiation? - Left Lower Lung and hilum / 70 Gy in 35 fractions -  10/05/2012-11/20/2012  Pacemaker/ICD? No  Possible current pregnancy? No  Is the patient on methotrexate? No  Current Complaints /  other details:

## 2013-08-02 NOTE — Progress Notes (Signed)
Radiation Oncology         [phone] ________________________________  Outpatient Re-Consultation  Name: Jodi Frazier MRN: [id number]  Date: 08/03/2013  DOB: 04/05/1931  FO:YDXAJO, Sandria Manly, PA-C  [place], [place]*   REFERRING PHYSICIAN: [place]anne [place], [place]*  DIAGNOSIS: Left lower lung poorly differentiated adenocarcinoma, T2aN1M0 STAGE IIA, s/p external radiation to 70 Gy through 11/20/12 (she opted NOT to receive chemotherapy)   HISTORY OF PRESENT ILLNESS::Jodi Frazier is a 80 y.o. female who I know well from previous radiation treatments for stage IIA adenocarcinoma of the left lower lung. She received radiation alone and completed that in May 2014, opting not to receive chemotherapy. When I saw her in August, she started a steroid taper for pneumonitis. Those symptoms have resolved. However, from September onward, the patient has had a difficult several months. She reports episodes of dehydration, a kidney infection, nausea diarrhea related to the antibiotics for her kidney infection, multiple falls, vertigo, orthostatic hypotension.  She saw my partner, Dr. Tammi Klippel, in December for a new suspicious right upper lobe nodule. He recommended an MRI of her brain as part of her workup to rule out brain metastases. PET scan was also performed for staging.  MRI brain on 06/11/13 was Negative for metastatic disease to the brain. No acute abnormality.  No change from prior MRI.  PET on 06/21/13 showed: 1. Hypermetabolic right upper lobe pulmonary nodule consistent with  either primary bronchogenic carcinoma or metastasis.  2. New left suprahilar metabolic tissue surrounding the upper lobe bronchus is most consistent with lung cancer recurrence.  3. Focus of hypermetabolic consolidation in the lingula either represents a neoplasm versus postobstructive pneumonitis. Favor the latter.  4. Metabolic activity associated with nodular pleural parenchymal thickening at the right  lung apex is indeterminate and may be  inflammatory.  5. The left lower lobe nodule is not significant hypermetabolic.   Dr Tammi Klippel reviewed this PET scan. Per Dr Tammi Klippel "The patient does have increased hypermetabolism in the right lung nodule consistent with malignancy. Whether this represents a solitary oligo metastasis or a new primary stage I, it appears to be amenable to stereotactic body radiotherapy with curative intent. Her left side of the chest shows hypermetabolism associated with an area that has previously been clinically suggestive of radiation pneumonitis. The left side continues to show hypermetabolism which is consistent with radiation pneumonitis verses tumor recurrence. At this point, I would give the patient the benefit of the doubt and treat the new right-sided lung nodule definitively, while following the left-sided changes radiographically. Accordingly, I think it would be reasonable to proceed with scheduling CT simulation for SB RT to the right lung nodule at the patient's convenience."  She no-showed for the simulation.  Since that time, She was seen in ED on 1-17 with Dizziness Nausea Emesis and dc'd with Dx of Vertigo. She reports that her symptoms are well controlled with meclizine.  Today, she reports that her most bothersome symptom is dizziness which comes and goes. It is different from her vertigo. She's recently discharged from a rehabilitation facility and is at home, but has around-the-clock care and misses her autonomy.  She denies any persistent cough, or new respiratory issues. She has some shortness of breath with ambulation. She denies hemoptysis. She does not have any nausea or headaches. She reports recent weight loss of about 10 pounds and suboptimal appetite. She reports that her orthostatic hypotension has recently improved with medication management.  PREVIOUS RADIATION THERAPY: Yes as  above  PAST MEDICAL HISTORY:  has a past medical history of  Hypertension; Colitis; Coronary artery disease; High cholesterol; Kidney stones; PONV (postoperative nausea and vomiting); MI (myocardial infarction) (2010); Shortness of breath; Pneumonia; COPD (chronic obstructive pulmonary disease); Headache(784.0); Vertigo; Arthritis; Bruises easily; History of shingles (56yrs ago); Accessory skin tags; GERD (gastroesophageal reflux disease); Colitis; Diverticulosis; Urinary frequency; Stress incontinence; History of kidney stones; Hypothyroidism; Cataract; Macular degeneration, wet; Depression; Right-sided chest wall pain (08/28/12); Weight loss (08/28/12); Lung mass (08/12/12); Dementia; S/P radiation therapy ( 10/05/2012-11/20/2012); Adenocarcinoma of lung (09/07/12); Hypertension; At risk for fall due to comorbid condition; Vertigo; and Orthostatic hypotension.    PAST SURGICAL HISTORY: Past Surgical History  Procedure Laterality Date  . Hip arthroscopy  21yr ago    right hip-replacement  . Knee arthroscopy      LT  . Total knee arthroplasty  10yrs ago    left  . Appendectomy  57yrs ago  . Abdominal hysterectomy  58yrs ago   . Lithotripsy  2011  . Cataract surgery  2012    left  . Eye surgery  92yrs ago  . Bladder surgery  2001    tacked  . Coronary angioplasty with stent placement  2010    2 in rt coronary artery and 1 in another spot  . Colonoscopy    . Esophagogastroduodenoscopy    . Total knee arthroplasty  09/09/2011    Procedure: TOTAL KNEE ARTHROPLASTY;  Surgeon: Rudean Haskell, MD;  Location: [facility];  Service: Orthopedics;  Laterality: Right;  Right Total Knee Arthroplasty  . Bronchial brush biopsy Left 08/17/12    LLL Bronchial Washing / Brushing and Bronchial Biopsy: Negative for malignancy  . Left lower lobe needle core biopsy Left 09/07/12    Poorly Differentiated Adenocarcinoma   . Joint replacement      FAMILY HISTORY: family history includes Anesthesia problems in her mother; Cancer in her maternal aunt. There is no history of  Hypotension, Malignant hyperthermia, or Pseudochol deficiency.  SOCIAL HISTORY:  reports that she quit smoking about 10 years ago. Her smoking use included Cigarettes. She has a 40 pack-year smoking history. She has never used smokeless tobacco. She reports that she drinks alcohol. She reports that she does not use illicit drugs.  ALLERGIES: Codeine and Augmentin  MEDICATIONS:  Current Outpatient Prescriptions  Medication Sig Dispense Refill  . acetaminophen (TYLENOL) 325 MG tablet Take 650 mg by mouth every 6 (six) hours as needed.      Marland Kitchen albuterol (PROVENTIL HFA;VENTOLIN HFA) 108 (90 BASE) MCG/ACT inhaler Inhale 2 puffs into the lungs every 6 (six) hours as needed for wheezing.  1 Inhaler  3  . ALPRAZolam (XANAX) 0.25 MG tablet Take 1 tablet (0.25 mg total) by mouth 2 (two) times daily as needed for anxiety.  30 tablet  0  . amitriptyline (ELAVIL) 25 MG tablet Take 1 tablet (25 mg total) by mouth at bedtime.  30 tablet  0  . amLODipine (NORVASC) 2.5 MG tablet Take 1 tablet (2.5 mg total) by mouth daily.  30 tablet  0  . aspirin EC 81 MG tablet Take 81 mg by mouth daily.      . carvedilol (COREG) 6.25 MG tablet Take 1 tablet (6.25 mg total) by mouth 2 (two) times daily with a meal.  60 tablet  0  . cloNIDine (CATAPRES) 0.1 MG tablet Take 0.1 mg by mouth 2 (two) times daily.      . Cyanocobalamin (VITAMIN B-12 IJ) Inject 1,000 mcg/mL as  directed every 30 (thirty) days.      . cycloSPORINE (RESTASIS) 0.05 % ophthalmic emulsion Place 1 drop into both eyes 2 (two) times daily.       Marland Kitchen donepezil (ARICEPT) 10 MG tablet Take 1.5 tablets (15 mg total) by mouth at bedtime. Takes 1 and 1/2  45 tablet  0  . escitalopram (LEXAPRO) 10 MG tablet Take 1 tablet (10 mg total) by mouth daily.  30 tablet  0  . HYDROcodone-acetaminophen (NORCO/VICODIN) 5-325 MG per tablet Take 1-2 tablets by mouth every 6 (six) hours as needed for moderate pain.  30 tablet  0  . hydrocortisone cream 1 % Apply topically 2 (two)  times daily.  30 g  0  . levothyroxine (SYNTHROID) 125 MCG tablet Take 1 tablet (125 mcg total) by mouth daily before breakfast.  30 tablet  2  . meclizine (ANTIVERT) 50 MG tablet Take 25 mg by mouth 3 (three) times daily.      . mesalamine (LIALDA) 1.2 G EC tablet Take 1.2 g by mouth 2 (two) times daily.       . midodrine (PROAMATINE) 2.5 MG tablet Take 1 tablet (2.5 mg total) by mouth 2 (two) times daily with a meal.  60 tablet  0  . nitroGLYCERIN (NITROSTAT) 0.4 MG SL tablet Place 0.4 mg under the tongue every 5 (five) minutes as needed for chest pain.       Marland Kitchen ondansetron (ZOFRAN) 4 MG tablet Take 1 tablet (4 mg total) by mouth every 8 (eight) hours as needed for nausea or vomiting.  20 tablet  0  . pantoprazole (PROTONIX) 40 MG tablet Take 1 tablet (40 mg total) by mouth every morning.  30 tablet  0   No current facility-administered medications for this encounter.    REVIEW OF SYSTEMS:  Notable for that above.   PHYSICAL EXAM:  height is 5\' 11"  (1.803 m) and weight is 177 lb 1.6 oz (80.332 kg). Her temperature is 97.7 F (36.5 C). Her blood pressure is 150/78 and her pulse is 71.   General: Alert and oriented, in no acute distress. She is in a wheelchair. HEENT: Head is normocephalic. Pupils are asymmetric- related to prior eye surgery consistent w/ baseline. Extraocular movements are intact.  Neck: Neck is supple, no palpable cervical or supraclavicular lymphadenopathy. Heart: Regular in rate and rhythm with no murmurs, rubs, or gallops. Chest: Clear to auscultation bilaterally, with no rhonchi, wheezes, or rales. Abdomen: Soft, slightly tender in the right upper quadrant, nondistended, with no rigidity or guarding. Extremities: No cyanosis or edema. Lymphatics: No concerning lymphadenopathy. Skin: No concerning lesions. Neurologic: Cranial nerves II through XII are grossly intact. No obvious focalities. Speech is fluent. Psychiatric: Judgment and insight are intact. Affect is  appropriate.   ECOG = 3  LABORATORY DATA:  Lab Results  Component Value Date   WBC 7.6 07/17/2013   HGB 15.3* 07/17/2013   HCT 45.0 07/17/2013   MCV 88.1 07/17/2013   PLT 334 07/17/2013   CMP     Component Value Date/Time   NA 139 07/17/2013 1609   K 4.4 07/17/2013 1609   CL 105 07/17/2013 1609   CO2 27 06/30/2013 0405   GLUCOSE 108* 07/17/2013 1609   BUN 13 07/17/2013 1609   BUN 13.5 06/10/2013 1603   CREATININE 1.10 07/17/2013 1609   CREATININE 1.0 06/10/2013 1603   CREATININE 0.90 06/07/2013 1408   CALCIUM 9.5 06/30/2013 0405   PROT 6.2 06/07/2013 1408   ALBUMIN 3.5  06/07/2013 1408   AST 18 06/07/2013 1408   ALT 13 06/07/2013 1408   ALKPHOS 146* 06/07/2013 1408   BILITOT 0.5 06/07/2013 1408   GFRNONAA 52* 06/30/2013 0405   GFRAA 60* 06/30/2013 0405         RADIOGRAPHY:  As above    IMPRESSION/PLAN: I showed the patient and her daughter her PET scan and discussed the fact that the changes in the left chest could be due to inflammation or cancer progression. I talked with them about Dr. Johny Shears rationale for giving her the benefit of the doubt and following the left chest radiographically. I also talked about the option of getting biopsies to clarify her diagnosis, and the risks associated with that. I do not think that obtaining biopsies of the left chest is prudent for her, because if she does have recurrent cancer there, the standard treatment would be chemotherapy. However, she is adamantly against considering chemotherapy.  I estimated that the ability of stereotactic body radiotherapy to provide local control the right upper lobe nodule is greater than 90%. However, if the left chest is also cancerous, stereotactic body radiotherapy would not alter the overall course of her cancer. I explained that there is a very high suspicion that this nodule is cancerous. Biopsying the nodule would cary  a risk of a pneumothorax and the patient is not interested in that. Due to her prior  history of cancer, I do not feel a biopsy is warranted for the right upper lobe nodule. Stereotactic body radiotherapy would be given in 3-5 treatments. We talked about the risks benefits and side effects of this. Most common side effect would be some acute fatigue. There is a small risk of chest wall irritation in the long run as well as lung irritation or damage. I think she would tolerate this procedure well. A consent form was reviewed in detail the patient and she is enthusiastic to proceed with treatment. I think this is a good choice for her, because there is a considerable chance that the right upper lobe lesion is the only site of cancer currently (the left chest findings in her scans may very well be related to her prior history of pneumonitis)  and this treatment could provide a salvage /cure.  She has signed the consent form which was placed in her chart. Simulation has been scheduled for later this week.   I spent 25 minutes  face to face with the patient and more than 50% of that time was spent in counseling and/or coordination of care.    __________________________________________    Eppie Gibson, MD

## 2013-08-03 ENCOUNTER — Encounter: Payer: Self-pay | Admitting: Radiation Oncology

## 2013-08-03 ENCOUNTER — Telehealth: Payer: Self-pay | Admitting: Physician Assistant

## 2013-08-03 ENCOUNTER — Ambulatory Visit
Admission: RE | Admit: 2013-08-03 | Discharge: 2013-08-03 | Disposition: A | Discharge status: Home / Self Care | Payer: Medicare Other | Source: Ambulatory Visit | Attending: Radiation Oncology | Admitting: Radiation Oncology

## 2013-08-03 VITALS — BP 150/78 | HR 71 | Temp 97.7°F | Ht 71.0 in | Wt 177.1 lb

## 2013-08-03 DIAGNOSIS — I10 Essential (primary) hypertension: Secondary | ICD-10-CM | POA: Insufficient documentation

## 2013-08-03 DIAGNOSIS — Z79899 Other long term (current) drug therapy: Secondary | ICD-10-CM | POA: Insufficient documentation

## 2013-08-03 DIAGNOSIS — Z7982 Long term (current) use of aspirin: Secondary | ICD-10-CM | POA: Insufficient documentation

## 2013-08-03 DIAGNOSIS — Z96659 Presence of unspecified artificial knee joint: Secondary | ICD-10-CM | POA: Insufficient documentation

## 2013-08-03 DIAGNOSIS — F0390 Unspecified dementia without behavioral disturbance: Secondary | ICD-10-CM | POA: Insufficient documentation

## 2013-08-03 DIAGNOSIS — R911 Solitary pulmonary nodule: Secondary | ICD-10-CM | POA: Insufficient documentation

## 2013-08-03 DIAGNOSIS — J4489 Other specified chronic obstructive pulmonary disease: Secondary | ICD-10-CM | POA: Insufficient documentation

## 2013-08-03 DIAGNOSIS — C3430 Malignant neoplasm of lower lobe, unspecified bronchus or lung: Secondary | ICD-10-CM | POA: Insufficient documentation

## 2013-08-03 DIAGNOSIS — C3410 Malignant neoplasm of upper lobe, unspecified bronchus or lung: Secondary | ICD-10-CM

## 2013-08-03 DIAGNOSIS — I951 Orthostatic hypotension: Secondary | ICD-10-CM | POA: Insufficient documentation

## 2013-08-03 DIAGNOSIS — R42 Dizziness and giddiness: Secondary | ICD-10-CM | POA: Insufficient documentation

## 2013-08-03 DIAGNOSIS — K219 Gastro-esophageal reflux disease without esophagitis: Secondary | ICD-10-CM | POA: Insufficient documentation

## 2013-08-03 DIAGNOSIS — I252 Old myocardial infarction: Secondary | ICD-10-CM | POA: Insufficient documentation

## 2013-08-03 DIAGNOSIS — I2510 Atherosclerotic heart disease of native coronary artery without angina pectoris: Secondary | ICD-10-CM | POA: Insufficient documentation

## 2013-08-03 DIAGNOSIS — E039 Hypothyroidism, unspecified: Secondary | ICD-10-CM | POA: Insufficient documentation

## 2013-08-03 DIAGNOSIS — J449 Chronic obstructive pulmonary disease, unspecified: Secondary | ICD-10-CM | POA: Insufficient documentation

## 2013-08-03 DIAGNOSIS — Z923 Personal history of irradiation: Secondary | ICD-10-CM | POA: Insufficient documentation

## 2013-08-03 HISTORY — DX: Orthostatic hypotension: I95.1

## 2013-08-03 HISTORY — DX: History of falling: Z91.81

## 2013-08-03 NOTE — Telephone Encounter (Signed)
Physical therapy is scheduled 1 on week 1 and 2 on week 6

## 2013-08-04 ENCOUNTER — Ambulatory Visit
Admission: RE | Admit: 2013-08-04 | Discharge: 2013-08-04 | Disposition: A | Discharge status: Home / Self Care | Payer: Medicare Other | Source: Ambulatory Visit | Attending: Radiation Oncology | Admitting: Radiation Oncology

## 2013-08-04 ENCOUNTER — Other Ambulatory Visit: Payer: Self-pay | Admitting: Radiation Oncology

## 2013-08-04 DIAGNOSIS — Z510 Encounter for antineoplastic radiation therapy: Secondary | ICD-10-CM | POA: Insufficient documentation

## 2013-08-04 DIAGNOSIS — C3410 Malignant neoplasm of upper lobe, unspecified bronchus or lung: Secondary | ICD-10-CM

## 2013-08-04 DIAGNOSIS — H8110 Benign paroxysmal vertigo, unspecified ear: Secondary | ICD-10-CM

## 2013-08-04 DIAGNOSIS — C3430 Malignant neoplasm of lower lobe, unspecified bronchus or lung: Secondary | ICD-10-CM | POA: Insufficient documentation

## 2013-08-04 NOTE — Progress Notes (Addendum)
  Radiation Oncology         [phone]) [phone] ________________________________  Name: Jodi Frazier MRN: [id number]  Date: 08/04/2013  DOB: [birth year]  SIMULATION AND TREATMENT PLANNING NOTE  Outpatient  DIAGNOSIS:  Right lung cancer  NARRATIVE:  The patient was brought to the [facility] suite.  Identity was confirmed.  All relevant records and images related to the planned course of therapy were reviewed.  The patient freely provided informed written consent to proceed with treatment after reviewing the details related to the planned course of therapy. The consent form was witnessed and verified by the simulation staff.    Then, the patient was set-up in a stable reproducible  prone position for radiation therapy.  Abdominal paddle secured in place for reducing diaphragmatic mobility.  4DCT sim note In order to account for effect of respiratory motion on target structures and other organs in the planning and delivery of radiotherapy, this patient underwent respiratory motion management simulation.  To accomplish this, when the patient was brought to the CT simulation planning suite, 4D respiratory motion management CT images were obtained.  The CT images were loaded into the planning software.  Then, using a variety of tools including Cine, MIP, and standard views, the target volume and planning target volumes (PTV) were delineated.  Avoidance structures were contoured.  Treatment planning then occurred.  Dose volume histograms were generated and reviewed for each of the requested structure.    TREATMENT PLANNING NOTE: Treatment planning then occurred.  The radiation prescription was entered and confirmed.    A total of 2 medically necessary complex treatment devices were fabricated and supervised by me - vac loc for immobilization of body, accuform for immobilization of head. I have requested : 3D Simulation  I have requested a DVH of the following structures: lungs, heart, cord,  esophagus, PTV.   The patient will receive 60 Gy in 5 fractions - or possibly 54 Gy in 3 fractions, depending on final plan.  SBRT will be used - number of arcs is pending.  Special Treatment Procedure Note: The patient received prior radiotherapy close to her current fields. There could be some overlap of radiation dose.  Prior regional radiotherapy increases the risk of side effects from treatment. I have considered this in the treatment planning process and aim to limit significant dose overlap.  This increases the complexity of this patient's treatment and therefore this constitutes a special treatment procedure. -----------------------------------  Eppie Gibson, MD

## 2013-08-04 NOTE — Telephone Encounter (Signed)
FYI

## 2013-08-04 NOTE — Telephone Encounter (Signed)
I need clarification on this.  Is this just to make me aware the patient is currently being seen by Physical Therapy?  Or are they requesting a home health order for physical therapy.

## 2013-08-05 ENCOUNTER — Telehealth: Payer: Self-pay | Admitting: *Deleted

## 2013-08-05 NOTE — Telephone Encounter (Signed)
CALLED PATIENT TO INFORM OF APPT. WITH DR. Gerald Stabs NEWMAN ON 08-09-13- ARRIVAL TIME - 12:50 PM, SPOKE WITH PATIENT AND SHE IS AWARE OF THIS APPT.

## 2013-08-06 ENCOUNTER — Ambulatory Visit: Payer: Medicare Other | Admitting: Radiation Oncology

## 2013-08-13 ENCOUNTER — Ambulatory Visit
Admission: RE | Admit: 2013-08-13 | Discharge: 2013-08-13 | Disposition: A | Discharge status: Home / Self Care | Payer: Medicare Other | Source: Ambulatory Visit | Attending: Radiation Oncology | Admitting: Radiation Oncology

## 2013-08-13 DIAGNOSIS — C3410 Malignant neoplasm of upper lobe, unspecified bronchus or lung: Secondary | ICD-10-CM

## 2013-08-13 DIAGNOSIS — C3430 Malignant neoplasm of lower lobe, unspecified bronchus or lung: Secondary | ICD-10-CM

## 2013-08-13 NOTE — Addendum Note (Signed)
Encounter addended by: Eppie Gibson, MD on: 08/13/2013  7:44 AM<BR>     Documentation filed: Notes Section

## 2013-08-13 NOTE — Progress Notes (Signed)
  Radiation Oncology         (336) [phone] ________________________________  Name: Jodi Frazier MRN: [id number]  Date: 08/13/2013  DOB: 05/15/1931  Stereotactic Body Radiotherapy Treatment Procedure Note Outpatient Right Upper Lung 1st of 36fractions 74 of 54Gy  NARRATIVE:  Jodi Frazier was brought to the stereotactic radiation treatment machine and placed supine on the CT couch. The patient was set up for stereotactic body radiotherapy on the body fix pillow.  3D TREATMENT PLANNING AND DOSIMETRY:  The patient's radiation plan was reviewed and approved prior to starting treatment.  It showed 3-dimensional radiation distributions overlaid onto the planning CT.  The [facility] for the target structures as well as the organs at risk were reviewed. The documentation of this is filed in the radiation oncology EMR.  SIMULATION VERIFICATION:  The patient underwent CT imaging on the treatment unit.  These were carefully aligned to document that the ablative radiation dose would cover the target volume and maximally spare the nearby organs at risk according to the planned distribution.  SPECIAL TREATMENT PROCEDURE: Jodi Frazier received high dose ablative stereotactic body radiotherapy to the planned target volume without unforeseen complications. Treatment was delivered uneventfully. The high doses associated with stereotactic body radiotherapy and the significant potential risks require careful treatment set up and patient monitoring constituting a special treatment procedure   STEREOTACTIC TREATMENT MANAGEMENT:  Following delivery, the patient was evaluated clinically. The patient tolerated treatment without significant acute effects, and was discharged to home in stable condition.    PLAN: Continue treatment as planned.  ________________________________  Eppie Gibson, MD

## 2013-08-17 ENCOUNTER — Ambulatory Visit: Admission: RE | Admit: 2013-08-17 | Payer: Medicare Other | Source: Ambulatory Visit | Admitting: Radiation Oncology

## 2013-08-17 NOTE — Addendum Note (Signed)
Encounter addended by: Deirdre Evener, RN on: 08/17/2013 11:54 AM<BR>     Documentation filed: Charges VN

## 2013-08-19 ENCOUNTER — Ambulatory Visit: Payer: Medicare Other | Admitting: Radiation Oncology

## 2013-08-19 ENCOUNTER — Ambulatory Visit (INDEPENDENT_AMBULATORY_CARE_PROVIDER_SITE_OTHER): Payer: Medicare Other | Admitting: Physician Assistant

## 2013-08-19 ENCOUNTER — Encounter: Payer: Self-pay | Admitting: Physician Assistant

## 2013-08-19 VITALS — BP 144/72 | HR 62 | Temp 97.8°F | Resp 16 | Ht 71.0 in | Wt 177.5 lb

## 2013-08-19 DIAGNOSIS — R3915 Urgency of urination: Secondary | ICD-10-CM

## 2013-08-19 DIAGNOSIS — I951 Orthostatic hypotension: Secondary | ICD-10-CM

## 2013-08-19 DIAGNOSIS — F411 Generalized anxiety disorder: Secondary | ICD-10-CM

## 2013-08-19 DIAGNOSIS — F419 Anxiety disorder, unspecified: Secondary | ICD-10-CM

## 2013-08-19 DIAGNOSIS — I10 Essential (primary) hypertension: Secondary | ICD-10-CM

## 2013-08-19 MED ORDER — CARVEDILOL 6.25 MG PO TABS: 6.2500 mg | ORAL_TABLET | Freq: Two times a day (BID) | ORAL | Status: DC

## 2013-08-19 MED ORDER — AMLODIPINE BESYLATE 2.5 MG PO TABS: 2.5000 mg | ORAL_TABLET | Freq: Every day | ORAL | Status: DC

## 2013-08-19 MED ORDER — FLUTICASONE FUROATE-VILANTEROL 100-25 MCG/INH IN AEPB: 1.0000 | INHALATION_SPRAY | Freq: Every day | RESPIRATORY_TRACT | Status: DC

## 2013-08-19 MED ORDER — ESCITALOPRAM OXALATE 10 MG PO TABS: 10.0000 mg | ORAL_TABLET | Freq: Every day | ORAL | Status: DC

## 2013-08-19 MED ORDER — AMITRIPTYLINE HCL 25 MG PO TABS: 25.0000 mg | ORAL_TABLET | Freq: Every day | ORAL | Status: DC

## 2013-08-19 MED ORDER — MECLIZINE HCL 50 MG PO TABS: 25.0000 mg | ORAL_TABLET | Freq: Three times a day (TID) | ORAL | Status: DC

## 2013-08-19 MED ORDER — MIDODRINE HCL 2.5 MG PO TABS: 2.5000 mg | ORAL_TABLET | Freq: Two times a day (BID) | ORAL | Status: DC

## 2013-08-19 NOTE — Progress Notes (Signed)
Pre visit review using our clinic review tool, if applicable. No additional management support is needed unless otherwise documented below in the visit note/SLS  

## 2013-08-19 NOTE — Patient Instructions (Addendum)
I have refilled your medications. Please continue your BP medications and the Midodrine as prescribed.  I would like to see you for follow-up in 3 months.  Return sooner if needed.    Orthostatic Hypotension Orthostatic hypotension is a sudden fall in blood pressure. It occurs when a person goes from a sitting or lying position to a standing position. CAUSES   Loss of body fluids (dehydration).  Medicines that lower blood pressure.  Sudden changes in posture, such as sudden standing when you have been sitting or lying down.  Taking too much of your medicine. SYMPTOMS   Lightheadedness or dizziness.  Fainting or near-fainting.  A fast heart rate (tachycardia).  Weakness.  Feeling tired (fatigue). DIAGNOSIS  Your caregiver may find the cause of orthostatic hypotension through:  A history and/or physical exam.  Checking your blood pressure. Your caregiver will check your blood pressure when you are:  Lying down.  Sitting.  Standing.  Tilt table testing. In this test, you are placed on a table that goes from a lying position to a standing position. You will be strapped to the table. This test helps to monitor your blood pressure and heart rate when you are in different positions. TREATMENT   If orthostatic hypotension is caused by your medicines, your caregiver will need to adjust your dosage. Do not stop or adjust your medicine on your own.  When changing positions, make these changes slowly. This allows your body to adjust to the different position.  Compression stockings that are worn on your lower legs may be helpful.  Your caregiver may have you consume extra salt. Do not add extra salt to your diet unless directed by your caregiver.  Eat frequent, small meals. Avoid sudden standing after eating.  Avoid hot showers or excessive heat.  Your caregiver may give you fluids through the vein (intravenous).  Your caregiver may put you on medicine to help enhance fluid  retention. SEEK IMMEDIATE MEDICAL CARE IF:   You faint or have a near-fainting episode. Call your local emergency services (911 in [place]  You have or develop chest pain.  You feel sick to your stomach (nauseous) or vomit.  You have a loss of feeling or movement in your arms or legs.  You have difficulty talking, slurred speech, or you are unable to talk.  You have difficulty thinking or have confused thinking. MAKE SURE YOU:   Understand these instructions.  Will watch your condition.  Will get help right away if you are not doing well or get worse. Document Released: 06/07/2002 Document Revised: 09/09/2011 Document Reviewed: 09/30/2008 [facility] Patient Information 2014 [place], [place].

## 2013-08-20 ENCOUNTER — Encounter: Payer: Self-pay | Admitting: Radiation Oncology

## 2013-08-20 ENCOUNTER — Ambulatory Visit
Admission: RE | Admit: 2013-08-20 | Discharge: 2013-08-20 | Disposition: A | Discharge status: Home / Self Care | Payer: Medicare Other | Source: Ambulatory Visit | Attending: Radiation Oncology | Admitting: Radiation Oncology

## 2013-08-20 VITALS — BP 168/78 | HR 71 | Temp 97.3°F | Ht 71.0 in | Wt 172.0 lb

## 2013-08-20 DIAGNOSIS — C3410 Malignant neoplasm of upper lobe, unspecified bronchus or lung: Secondary | ICD-10-CM

## 2013-08-20 NOTE — Assessment & Plan Note (Signed)
Orthostatics negative while on Midodrine.  Asymptomatic.  Continue midodrine as directed.  Follow-up in 3 months as patient is doing well.

## 2013-08-20 NOTE — Assessment & Plan Note (Signed)
BP acceptable in clinic today.  We are allowing for SBP up to 160 per Cardiology consult.  Continue current regimen.

## 2013-08-20 NOTE — Progress Notes (Signed)
Patient presents to clinic today for BPa nd follow-up of orthostatic hypotension.  Patient currently taking amlodipine and carvedilol in small doses for HTN.  Patient has fluctuating BP, likely due to her malignancy.  Patient was placed on midodrine as she was experiencing lightheadedness associated with orthostatic hypotension.  Since addition of Midodrine patient's BP has stabilized.  BP in clinic today is 144/72.  Patient is asymptomatic.  OVS are 117/60 lying, 108/55 sitting, and 116/61 standing for 2 minutes.  Patient is currently having physical therapy to help her LE strength after sustaining a fall a few months ago.  Patient has also decided to continue with radiation for her malignancy instead of pursuing palliative care.  Patient states she is doing great and her mood has been good lately.  States she is getting bored at home currently because she does not like having the home health nurse "watching" her all of the time.   Past Medical History  Diagnosis Date  . Hypertension   . Colitis   . Coronary artery disease   . High cholesterol     takes Red Yeast Rice and Fish OIl daily  . Kidney stones   . PONV (postoperative nausea and vomiting)   . MI (myocardial infarction) 2010  . Shortness of breath     with exertion  . Pneumonia     hx of 20+yrs ago  . COPD (chronic obstructive pulmonary disease)     pleurisy or COPD exascerbation > 83yrago  . Headache(784.0)   . Vertigo     HTN related bc gets up too fast  . Arthritis   . Bruises easily   . History of shingles 161yrago    in eye;occasionally gets some residual   . Accessory skin tags     arms/legs  . GERD (gastroesophageal reflux disease)   . Colitis   . Diverticulosis   . Urinary frequency   . Stress incontinence   . History of kidney stones   . Hypothyroidism     takes sYnthroid daily  . Cataract     early stage on right  . Macular degeneration, wet   . Depression     some but takes Amitryptylline nightly  .  Right-sided chest wall pain 08/28/12    PRESENTATION - RIGHT SIDED ANTERIOR CHEST PAIN  . Weight loss 08/28/12    10 LB WEIGHT LOSS OVER 3 MONTHS  . Lung mass 08/12/12    CHEST-XRAY/ PET -LOBULAR MASS LLL - 4.1 X 3.6 X 4.2 CM  . Dementia     mild  . S/P radiation therapy  10/05/2012-11/20/2012    Left Lower Lung and hilum / 70 Gy in 35 fractions  . Adenocarcinoma of lung 09/07/12    needle core bx-LLL-adenocarcinoma  . Hypertension   . At risk for fall due to comorbid condition     5 falls - last Early December  . Vertigo   . Orthostatic hypotension     Current Outpatient Prescriptions on File Prior to Visit  Medication Sig Dispense Refill  . acetaminophen (TYLENOL) 325 MG tablet Take 650 mg by mouth every 6 (six) hours as needed.      . Marland Kitchenlbuterol (PROVENTIL HFA;VENTOLIN HFA) 108 (90 BASE) MCG/ACT inhaler Inhale 2 puffs into the lungs every 6 (six) hours as needed for wheezing.  1 Inhaler  3  . ALPRAZolam (XANAX) 0.25 MG tablet Take 1 tablet (0.25 mg total) by mouth 2 (two) times daily as needed for anxiety.  30 tablet  0  . aspirin EC 81 MG tablet Take 81 mg by mouth daily.      . cloNIDine (CATAPRES) 0.1 MG tablet Take 0.1 mg by mouth 2 (two) times daily as needed.       . Cyanocobalamin (VITAMIN B-12 IJ) Inject 1,000 mcg/mL as directed every 30 (thirty) days.      . cycloSPORINE (RESTASIS) 0.05 % ophthalmic emulsion Place 1 drop into both eyes 2 (two) times daily.       Marland Kitchen donepezil (ARICEPT) 10 MG tablet Take 1.5 tablets (15 mg total) by mouth at bedtime. Takes 1 and 1/2  45 tablet  0  . HYDROcodone-acetaminophen (NORCO/VICODIN) 5-325 MG per tablet Take 1-2 tablets by mouth every 6 (six) hours as needed for moderate pain.  30 tablet  0  . levothyroxine (SYNTHROID) 125 MCG tablet Take 1 tablet (125 mcg total) by mouth daily before breakfast.  30 tablet  2  . mesalamine (LIALDA) 1.2 G EC tablet Take 1.2 g by mouth 2 (two) times daily.       . nitroGLYCERIN (NITROSTAT) 0.4 MG SL tablet Place  0.4 mg under the tongue every 5 (five) minutes as needed for chest pain.       Marland Kitchen ondansetron (ZOFRAN) 4 MG tablet Take 1 tablet (4 mg total) by mouth every 8 (eight) hours as needed for nausea or vomiting.  20 tablet  0  . pantoprazole (PROTONIX) 40 MG tablet Take 1 tablet (40 mg total) by mouth every morning.  30 tablet  0   No current facility-administered medications on file prior to visit.    Allergies  Allergen Reactions  . Codeine Hives  . Augmentin [Amoxicillin-Pot Clavulanate] Diarrhea and Nausea And Vomiting    Family History  Problem Relation Age of Onset  . Anesthesia problems Mother   . Hypotension Neg Hx   . Malignant hyperthermia Neg Hx   . Pseudochol deficiency Neg Hx   . Cancer Maternal Aunt     breast    History   Social History  . Marital Status: Widowed    Spouse Name: N/A    Number of Children: 4  . Years of Education: N/A   Occupational History  . retired     Press photographer   Social History Main Topics  . Smoking status: Former Smoker -- 1.00 packs/day for 40 years    Types: Cigarettes    Quit date: 08/26/2002  . Smokeless tobacco: Never Used     Comment: quit several yrs ago  . Alcohol Use: Yes     Comment: 2 times week alcohol  . Drug Use: No  . Sexual Activity: No   Other Topics Concern  . None   Social History Narrative   Daughter is nurse in neuro ICU    Review of Systems - See HPI.  All other ROS are negative.  BP 144/72  Pulse 62  Temp(Src) 97.8 F (36.6 C) (Oral)  Resp 16  Ht '5\' 11"'  (1.803 m)  Wt 177 lb 8 oz (80.513 kg)  BMI 24.77 kg/m2  SpO2 97%  Physical Exam  Vitals reviewed. Constitutional: She is oriented to person, place, and time and well-developed, well-nourished, and in no distress.  HENT:  Head: Normocephalic and atraumatic.  Eyes: Conjunctivae are normal.  Neck: Neck supple.  Cardiovascular: Normal rate, regular rhythm, normal heart sounds and intact distal pulses.   Pulmonary/Chest: Effort normal and breath  sounds normal. No respiratory distress. She has no wheezes. She has no rales. She exhibits  no tenderness.  Neurological: She is alert and oriented to person, place, and time. No cranial nerve deficit.  Skin: Skin is warm and dry. No rash noted.  Psychiatric: Affect normal.    Recent Results (from the past 2160 hour(s))  COMPREHENSIVE METABOLIC PANEL     Status: Abnormal   Collection Time    06/07/13  2:08 PM      Result Value Ref Range   Sodium 135  135 - 145 mEq/L   Potassium 4.4  3.5 - 5.3 mEq/L   Chloride 101  96 - 112 mEq/L   CO2 26  19 - 32 mEq/L   Glucose, Bld 84  70 - 99 mg/dL   BUN 14  6 - 23 mg/dL   Creat 0.90  0.50 - 1.10 mg/dL   Total Bilirubin 0.5  0.3 - 1.2 mg/dL   Alkaline Phosphatase 146 (*) 39 - 117 U/L   AST 18  0 - 37 U/L   ALT 13  0 - 35 U/L   Total Protein 6.2  6.0 - 8.3 g/dL   Albumin 3.5  3.5 - 5.2 g/dL   Calcium 10.1  8.4 - 10.5 mg/dL  CBC WITH DIFFERENTIAL     Status: Abnormal   Collection Time    06/07/13  2:08 PM      Result Value Ref Range   WBC 8.0  4.0 - 10.5 K/uL   RBC 4.27  3.87 - 5.11 MIL/uL   Hemoglobin 12.5  12.0 - 15.0 g/dL   HCT 36.3  36.0 - 46.0 %   MCV 85.0  78.0 - 100.0 fL   MCH 29.3  26.0 - 34.0 pg   MCHC 34.4  30.0 - 36.0 g/dL   RDW 17.3 (*) 11.5 - 15.5 %   Platelets 430 (*) 150 - 400 K/uL   Neutrophils Relative % 71  43 - 77 %   Neutro Abs 5.8  1.7 - 7.7 K/uL   Lymphocytes Relative 14  12 - 46 %   Lymphs Abs 1.1  0.7 - 4.0 K/uL   Monocytes Relative 11  3 - 12 %   Monocytes Absolute 0.9  0.1 - 1.0 K/uL   Eosinophils Relative 2  0 - 5 %   Eosinophils Absolute 0.2  0.0 - 0.7 K/uL   Basophils Relative 2 (*) 0 - 1 %   Basophils Absolute 0.1  0.0 - 0.1 K/uL   Smear Review Criteria for review not met    POCT URINALYSIS DIPSTICK     Status: None   Collection Time    06/07/13  2:35 PM      Result Value Ref Range   Color, UA yellow     Clarity, UA clear     Glucose, UA neg     Bilirubin, UA neg     Ketones, UA neg     Spec  Grav, UA 1.010     Blood, UA neg     pH, UA 5.0     Protein, UA neg     Urobilinogen, UA negative     Nitrite, UA neg     Leukocytes, UA Trace    CULTURE, URINE COMPREHENSIVE     Status: None   Collection Time    06/07/13  2:37 PM      Result Value Ref Range   Culture ESCHERICHIA COLI     Colony Count >=100,000 COLONIES/ML     Organism ID, Bacteria ESCHERICHIA COLI    BUN AND CREATININE (CC13)  Status: None   Collection Time    06/10/13  4:03 PM      Result Value Ref Range   BUN 13.5  7.0 - 26.0 mg/dL   Creatinine 1.0  0.6 - 1.1 mg/dL  GLUCOSE, CAPILLARY     Status: None   Collection Time    06/21/13  6:57 AM      Result Value Ref Range   Glucose-Capillary 91  70 - 99 mg/dL  BASIC METABOLIC PANEL     Status: Abnormal   Collection Time    06/26/13  4:32 PM      Result Value Ref Range   Sodium 136  135 - 145 mEq/L   Potassium 4.3  3.5 - 5.1 mEq/L   Chloride 101  96 - 112 mEq/L   CO2 23  19 - 32 mEq/L   Glucose, Bld 101 (*) 70 - 99 mg/dL   BUN 8  6 - 23 mg/dL   Creatinine, Ser 0.72  0.50 - 1.10 mg/dL   Calcium 10.6 (*) 8.4 - 10.5 mg/dL   GFR calc non Af Amer 79 (*) >90 mL/min   GFR calc Af Amer >90  >90 mL/min   Comment: (NOTE)     The eGFR has been calculated using the CKD EPI equation.     This calculation has not been validated in all clinical situations.     eGFR's persistently <90 mL/min signify possible Chronic Kidney     Disease.  CBC     Status: Abnormal   Collection Time    06/26/13  4:32 PM      Result Value Ref Range   WBC 8.4  4.0 - 10.5 K/uL   RBC 4.76  3.87 - 5.11 MIL/uL   Hemoglobin 14.1  12.0 - 15.0 g/dL   HCT 42.5  36.0 - 46.0 %   MCV 89.3  78.0 - 100.0 fL   MCH 29.6  26.0 - 34.0 pg   MCHC 33.2  30.0 - 36.0 g/dL   RDW 15.1  11.5 - 15.5 %   Platelets 452 (*) 150 - 400 K/uL  TROPONIN I     Status: None   Collection Time    06/26/13  4:32 PM      Result Value Ref Range   Troponin I <0.30  <0.30 ng/mL   Comment:            Due to the release  kinetics of cTnI,     a negative result within the first hours     of the onset of symptoms does not rule out     myocardial infarction with certainty.     If myocardial infarction is still suspected,     repeat the test at appropriate intervals.  PRO B NATRIURETIC PEPTIDE     Status: None   Collection Time    06/26/13  4:32 PM      Result Value Ref Range   Pro B Natriuretic peptide (BNP) 155.8  0 - 450 pg/mL  POCT I-STAT TROPONIN I     Status: None   Collection Time    06/26/13  4:36 PM      Result Value Ref Range   Troponin i, poc 0.01  0.00 - 0.08 ng/mL   Comment 3            Comment: Due to the release kinetics of cTnI,     a negative result within the first hours     of the onset of  symptoms does not rule out     myocardial infarction with certainty.     If myocardial infarction is still suspected,     repeat the test at appropriate intervals.  OCCULT BLOOD, POC DEVICE     Status: None   Collection Time    06/26/13  5:45 PM      Result Value Ref Range   Fecal Occult Bld NEGATIVE  NEGATIVE  TROPONIN I     Status: None   Collection Time    06/26/13  9:28 PM      Result Value Ref Range   Troponin I <0.30  <0.30 ng/mL   Comment:            Due to the release kinetics of cTnI,     a negative result within the first hours     of the onset of symptoms does not rule out     myocardial infarction with certainty.     If myocardial infarction is still suspected,     repeat the test at appropriate intervals.  CBC     Status: Abnormal   Collection Time    06/27/13  4:18 AM      Result Value Ref Range   WBC 6.6  4.0 - 10.5 K/uL   RBC 4.30  3.87 - 5.11 MIL/uL   Hemoglobin 12.4  12.0 - 15.0 g/dL   HCT 38.5  36.0 - 46.0 %   MCV 89.5  78.0 - 100.0 fL   MCH 28.8  26.0 - 34.0 pg   MCHC 32.2  30.0 - 36.0 g/dL   RDW 15.5  11.5 - 15.5 %   Platelets 429 (*) 150 - 400 K/uL  BASIC METABOLIC PANEL     Status: Abnormal   Collection Time    06/27/13  4:18 AM      Result Value Ref  Range   Sodium 133 (*) 135 - 145 mEq/L   Potassium 3.2 (*) 3.5 - 5.1 mEq/L   Comment: DELTA CHECK NOTED     REPEATED TO VERIFY   Chloride 97  96 - 112 mEq/L   CO2 23  19 - 32 mEq/L   Glucose, Bld 113 (*) 70 - 99 mg/dL   BUN 7  6 - 23 mg/dL   Creatinine, Ser 0.76  0.50 - 1.10 mg/dL   Calcium 10.0  8.4 - 10.5 mg/dL   GFR calc non Af Amer 78 (*) >90 mL/min   GFR calc Af Amer 90 (*) >90 mL/min   Comment: (NOTE)     The eGFR has been calculated using the CKD EPI equation.     This calculation has not been validated in all clinical situations.     eGFR's persistently <90 mL/min signify possible Chronic Kidney     Disease.  TROPONIN I     Status: None   Collection Time    06/27/13  4:18 AM      Result Value Ref Range   Troponin I <0.30  <0.30 ng/mL   Comment:            Due to the release kinetics of cTnI,     a negative result within the first hours     of the onset of symptoms does not rule out     myocardial infarction with certainty.     If myocardial infarction is still suspected,     repeat the test at appropriate intervals.  TROPONIN I     Status: None  Collection Time    06/27/13 10:35 AM      Result Value Ref Range   Troponin I <0.30  <0.30 ng/mL   Comment:            Due to the release kinetics of cTnI,     a negative result within the first hours     of the onset of symptoms does not rule out     myocardial infarction with certainty.     If myocardial infarction is still suspected,     repeat the test at appropriate intervals.  URINALYSIS W MICROSCOPIC + REFLEX CULTURE     Status: None   Collection Time    06/27/13  3:00 PM      Result Value Ref Range   Color, Urine YELLOW  YELLOW   APPearance CLEAR  CLEAR   Specific Gravity, Urine 1.019  1.005 - 1.030   pH 6.0  5.0 - 8.0   Glucose, UA NEGATIVE  NEGATIVE mg/dL   Hgb urine dipstick NEGATIVE  NEGATIVE   Bilirubin Urine NEGATIVE  NEGATIVE   Ketones, ur NEGATIVE  NEGATIVE mg/dL   Protein, ur NEGATIVE  NEGATIVE  mg/dL   Urobilinogen, UA 0.2  0.0 - 1.0 mg/dL   Nitrite NEGATIVE  NEGATIVE   Leukocytes, UA NEGATIVE  NEGATIVE   Bacteria, UA RARE  RARE   Squamous Epithelial / LPF RARE  RARE  BASIC METABOLIC PANEL     Status: Abnormal   Collection Time    06/28/13  4:19 AM      Result Value Ref Range   Sodium 135  135 - 145 mEq/L   Potassium 3.9  3.5 - 5.1 mEq/L   Comment: REPEATED TO VERIFY     DELTA CHECK NOTED     NO VISIBLE HEMOLYSIS   Chloride 99  96 - 112 mEq/L   CO2 27  19 - 32 mEq/L   Glucose, Bld 83  70 - 99 mg/dL   BUN 9  6 - 23 mg/dL   Creatinine, Ser 1.01  0.50 - 1.10 mg/dL   Calcium 10.2  8.4 - 10.5 mg/dL   GFR calc non Af Amer 51 (*) >90 mL/min   GFR calc Af Amer 59 (*) >90 mL/min   Comment: (NOTE)     The eGFR has been calculated using the CKD EPI equation.     This calculation has not been validated in all clinical situations.     eGFR's persistently <90 mL/min signify possible Chronic Kidney     Disease.  MAGNESIUM     Status: None   Collection Time    06/28/13  4:19 AM      Result Value Ref Range   Magnesium 1.9  1.5 - 2.5 mg/dL  TSH     Status: Abnormal   Collection Time    06/28/13  4:19 AM      Result Value Ref Range   TSH 28.276 (*) 0.350 - 4.500 uIU/mL   Comment: Performed at [facility]     Status: None   Collection Time    06/28/13  4:19 AM      Result Value Ref Range   Cortisol, Plasma 7.7     Comment: (NOTE)     AM:  4.3 - 22.4 ug/dL     PM:  3.1 - 16.7 ug/dL     Performed at [facility] DIFFICILE BY PCR     Status: None   Collection Time  06/28/13 10:00 AM      Result Value Ref Range   C difficile by pcr NEGATIVE  NEGATIVE   Comment: Performed at [facility] PANEL     Status: Abnormal   Collection Time    06/30/13  4:05 AM      Result Value Ref Range   Sodium 136 (*) 137 - 147 mEq/L   Comment: Please note change in reference range.   Potassium 3.8  3.7 - 5.3 mEq/L   Comment:  Please note change in reference range.   Chloride 100  96 - 112 mEq/L   CO2 27  19 - 32 mEq/L   Glucose, Bld 71  70 - 99 mg/dL   BUN 8  6 - 23 mg/dL   Creatinine, Ser 1.00  0.50 - 1.10 mg/dL   Calcium 9.5  8.4 - 10.5 mg/dL   GFR calc non Af Amer 52 (*) >90 mL/min   GFR calc Af Amer 60 (*) >90 mL/min   Comment: (NOTE)     The eGFR has been calculated using the CKD EPI equation.     This calculation has not been validated in all clinical situations.     eGFR's persistently <90 mL/min signify possible Chronic Kidney     Disease.  CBC     Status: Abnormal   Collection Time    06/30/13  4:05 AM      Result Value Ref Range   WBC 5.1  4.0 - 10.5 K/uL   RBC 3.86 (*) 3.87 - 5.11 MIL/uL   Hemoglobin 11.0 (*) 12.0 - 15.0 g/dL   HCT 35.4 (*) 36.0 - 46.0 %   MCV 91.7  78.0 - 100.0 fL   MCH 28.5  26.0 - 34.0 pg   MCHC 31.1  30.0 - 36.0 g/dL   RDW 15.3  11.5 - 15.5 %   Platelets 334  150 - 400 K/uL  URINALYSIS, ROUTINE W REFLEX MICROSCOPIC     Status: Abnormal   Collection Time    07/17/13  3:23 PM      Result Value Ref Range   Color, Urine YELLOW  YELLOW   APPearance CLOUDY (*) CLEAR   Specific Gravity, Urine 1.014  1.005 - 1.030   pH 7.0  5.0 - 8.0   Glucose, UA NEGATIVE  NEGATIVE mg/dL   Hgb urine dipstick NEGATIVE  NEGATIVE   Bilirubin Urine NEGATIVE  NEGATIVE   Ketones, ur NEGATIVE  NEGATIVE mg/dL   Protein, ur NEGATIVE  NEGATIVE mg/dL   Urobilinogen, UA 0.2  0.0 - 1.0 mg/dL   Nitrite NEGATIVE  NEGATIVE   Leukocytes, UA NEGATIVE  NEGATIVE   Comment: MICROSCOPIC NOT DONE ON URINES WITH NEGATIVE PROTEIN, BLOOD, LEUKOCYTES, NITRITE, OR GLUCOSE <1000 mg/dL.  CBC WITH DIFFERENTIAL     Status: Abnormal   Collection Time    07/17/13  3:50 PM      Result Value Ref Range   WBC 7.6  4.0 - 10.5 K/uL   RBC 4.64  3.87 - 5.11 MIL/uL   Hemoglobin 13.8  12.0 - 15.0 g/dL   HCT 40.9  36.0 - 46.0 %   MCV 88.1  78.0 - 100.0 fL   MCH 29.7  26.0 - 34.0 pg   MCHC 33.7  30.0 - 36.0 g/dL   RDW  14.5  11.5 - 15.5 %   Platelets 334  150 - 400 K/uL   Neutrophils Relative % 78 (*) 43 - 77 %   Neutro  Abs 5.9  1.7 - 7.7 K/uL   Lymphocytes Relative 14  12 - 46 %   Lymphs Abs 1.0  0.7 - 4.0 K/uL   Monocytes Relative 6  3 - 12 %   Monocytes Absolute 0.5  0.1 - 1.0 K/uL   Eosinophils Relative 2  0 - 5 %   Eosinophils Absolute 0.1  0.0 - 0.7 K/uL   Basophils Relative 1  0 - 1 %   Basophils Absolute 0.1  0.0 - 0.1 K/uL  POCT I-STAT, CHEM 8     Status: Abnormal   Collection Time    07/17/13  4:09 PM      Result Value Ref Range   Sodium 139  137 - 147 mEq/L   Potassium 4.4  3.7 - 5.3 mEq/L   Chloride 105  96 - 112 mEq/L   BUN 13  6 - 23 mg/dL   Creatinine, Ser 1.10  0.50 - 1.10 mg/dL   Glucose, Bld 108 (*) 70 - 99 mg/dL   Calcium, Ion 1.41 (*) 1.13 - 1.30 mmol/L   TCO2 25  0 - 100 mmol/L   Hemoglobin 15.3 (*) 12.0 - 15.0 g/dL   HCT 45.0  36.0 - 46.0 %    Assessment/Plan: HTN (hypertension) BP acceptable in clinic today.  We are allowing for SBP up to 160 per Cardiology consult.  Continue current regimen.  Orthostatic hypotension Orthostatics negative while on Midodrine.  Asymptomatic.  Continue midodrine as directed.  Follow-up in 3 months as patient is doing well.

## 2013-08-20 NOTE — Progress Notes (Signed)
  Radiation Oncology         [phone] ________________________________  Name: Jodi Frazier MRN: [id number]  Date: 08/20/2013  DOB: [birth year]  Stereotactic Body Radiotherapy Treatment Procedure Note Fraction 2 of 3 Lung outpatient  NARRATIVE:  Jodi Frazier was brought to the stereotactic radiation treatment machine and placed supine on the CT couch. The patient was set up for stereotactic body radiotherapy on the body fix pillow.  3D TREATMENT PLANNING AND DOSIMETRY:  The patient's radiation plan was reviewed and approved prior to starting treatment.  It showed 3-dimensional radiation distributions overlaid onto the planning CT.  The [facility] for the target structures as well as the organs at risk were reviewed. The documentation of this is filed in the radiation oncology EMR.  SIMULATION VERIFICATION:  The patient underwent CT imaging on the treatment unit.  These were carefully aligned to document that the ablative radiation dose would cover the target volume and maximally spare the nearby organs at risk according to the planned distribution.  SPECIAL TREATMENT PROCEDURE: Jodi Frazier received high dose ablative stereotactic body radiotherapy to the planned target volume without unforeseen complications. Treatment was delivered uneventfully. The high doses associated with stereotactic body radiotherapy and the significant potential risks require careful treatment set up and patient monitoring constituting a special treatment procedure   STEREOTACTIC TREATMENT MANAGEMENT:  Following delivery, the patient was evaluated clinically. The patient tolerated treatment without significant acute effects, and was discharged to home in stable condition.    PLAN: Continue treatment as planned.  ________________________________  Eppie Gibson, MD

## 2013-08-20 NOTE — Progress Notes (Signed)
Jodi Frazier has received 2 SBRT treatments to her lung.  She is traveling by wheelchair since she has periods of dizziness that she is related to her heart.  She denies any pain.  Her O2 sat is 98% on RA.

## 2013-08-20 NOTE — Progress Notes (Signed)
   Weekly Management Note:  Outpatient Current Dose:  36 Gy  Projected Dose: 54 Gy   Narrative:  The patient presents for routine under treatment assessment.  CBCT/MVCT images/Port film x-rays were reviewed.  The chart was checked. No new complaints. She had to delay fraction #2 due to the snow/cold weather.  Physical Findings:  height is 5\' 11"  (1.803 m) and weight is 172 lb (78.019 kg). Her temperature is 97.3 F (36.3 C). Her blood pressure is 168/78 and her pulse is 71. Her oxygen saturation is 98%.  NAD, breathing comfortably  Impression:  The patient is tolerating radiotherapy.  Plan:  Continue radiotherapy as planned.  F/u in 1 mo after last treatment.  ________________________________   Eppie Gibson, M.D.

## 2013-08-23 ENCOUNTER — Encounter: Payer: Self-pay | Admitting: Radiation Oncology

## 2013-08-23 ENCOUNTER — Ambulatory Visit
Admission: RE | Admit: 2013-08-23 | Discharge: 2013-08-23 | Disposition: A | Discharge status: Home / Self Care | Payer: Medicare Other | Source: Ambulatory Visit | Attending: Radiation Oncology | Admitting: Radiation Oncology

## 2013-08-23 ENCOUNTER — Ambulatory Visit: Payer: Medicare Other | Admitting: Radiation Oncology

## 2013-08-23 NOTE — Progress Notes (Signed)
  Radiation Oncology         [phone] ________________________________  Name: NALEYAH OHLINGER MRN: [id number]  Date: 08/23/2013  DOB: Oct 10, 1930  Stereotactic Body Radiotherapy Treatment Procedure Note  NARRATIVE:  Satsuki A Scantlebury was brought to the stereotactic radiation treatment machine and placed supine on the CT couch. The patient was set up for stereotactic body radiotherapy on the body fix pillow.  3D TREATMENT PLANNING AND DOSIMETRY:  The patient's radiation plan was reviewed and approved prior to starting treatment.  It showed 3-dimensional radiation distributions overlaid onto the planning CT.  The [facility] for the target structures as well as the organs at risk were reviewed. The documentation of this is filed in the radiation oncology EMR.  SIMULATION VERIFICATION:  The patient underwent CT imaging on the treatment unit.  These were carefully aligned to document that the ablative radiation dose would cover the target volume and maximally spare the nearby organs at risk according to the planned distribution.  SPECIAL TREATMENT PROCEDURE: Jocilyn A Simien received high dose ablative stereotactic body radiotherapy to the right lung planned target volume without unforeseen complications. Treatment was delivered uneventfully. She receive a further 1800 cGy for a cumulative dose of 5400 cGy in 3 fractions. The high doses associated with stereotactic body radiotherapy and the significant potential risks require careful treatment set up and patient monitoring constituting a special treatment procedure   STEREOTACTIC TREATMENT MANAGEMENT:  Following delivery, the patient was evaluated clinically. The patient tolerated treatment without significant acute effects, and was discharged to home in stable condition.    PLAN: Radiation therapy completed, followup in one month.  ------------------------------------------------        Rexene Edison, MD

## 2013-08-25 ENCOUNTER — Telehealth: Payer: Self-pay | Admitting: *Deleted

## 2013-08-25 ENCOUNTER — Ambulatory Visit: Payer: Medicare Other | Admitting: Radiation Oncology

## 2013-08-25 ENCOUNTER — Other Ambulatory Visit: Payer: Self-pay | Admitting: Physician Assistant

## 2013-08-25 NOTE — Telephone Encounter (Signed)
Called to inform patient that I had previously spoken with [facility] this morning after receiving an electronic request for pt's Amlodipine; seven total scripts were faxed to pharmacy on 02.19.14 for 90-day supply & this was verified w/pharmacy today. Also, to inform that there had been a handling problem with pt's urine sample and that I have just returned to office today since Friday to call pt; informed that we either need a new urine sample from patient [either in office or p/u sterile cup to take home and retrieve sample to be brought back refrigerated w/i 24 hours and/or if they are having Skilled Nursing with [facility], the nurse can get the sample to have U/A with reflex micro lab done. Asked for return call

## 2013-08-25 NOTE — Telephone Encounter (Signed)
Denied-Rx faxed & filled per pharmacy 02.19.15, #[id number]

## 2013-08-27 ENCOUNTER — Telehealth: Payer: Self-pay | Admitting: Physician Assistant

## 2013-08-27 DIAGNOSIS — E039 Hypothyroidism, unspecified: Secondary | ICD-10-CM

## 2013-08-27 MED ORDER — LEVOTHYROXINE SODIUM 125 MCG PO TABS: 125.0000 ug | ORAL_TABLET | Freq: Every day | ORAL | Status: DC

## 2013-08-27 NOTE — Telephone Encounter (Signed)
Rx request to pharmacy/SLS  

## 2013-08-27 NOTE — Telephone Encounter (Signed)
90 day supply request  synthroid

## 2013-09-06 ENCOUNTER — Telehealth: Payer: Self-pay | Admitting: Physician Assistant

## 2013-09-06 NOTE — Telephone Encounter (Signed)
Refill- cyanocobalamin

## 2013-09-08 MED ORDER — CYANOCOBALAMIN 1000 MCG/ML IJ SOLN: 1000.0000 ug | INTRAMUSCULAR | Status: DC

## 2013-09-08 NOTE — Telephone Encounter (Signed)
Rx request to pharmacy per provider verbal Ok/SLS

## 2013-09-13 ENCOUNTER — Telehealth: Payer: Self-pay

## 2013-09-13 NOTE — Telephone Encounter (Signed)
FYI Jodi Frazier with [facility] left a message stating that he would like to extend pts physical therapy for 2 more weeks, 2 times a week for Gait, balance and lower extremity strength.

## 2013-09-13 NOTE — Telephone Encounter (Signed)
I am ok with this.  Will need Dr. Charlett Blake to cosign orders that they fax to [place].

## 2013-09-15 ENCOUNTER — Telehealth (INDEPENDENT_AMBULATORY_CARE_PROVIDER_SITE_OTHER): Payer: Medicare Other | Admitting: *Deleted

## 2013-09-15 DIAGNOSIS — R399 Unspecified symptoms and signs involving the genitourinary system: Secondary | ICD-10-CM

## 2013-09-15 DIAGNOSIS — R3989 Other symptoms and signs involving the genitourinary system: Secondary | ICD-10-CM

## 2013-09-15 DIAGNOSIS — R8290 Unspecified abnormal findings in urine: Secondary | ICD-10-CM

## 2013-09-15 LAB — POCT URINALYSIS DIPSTICK
Bilirubin, UA: NEGATIVE
Glucose, UA: NEGATIVE
Ketones, UA: NEGATIVE
NITRITE UA: NEGATIVE
PH UA: 5
Protein, UA: NEGATIVE
Spec Grav, UA: 1.025
UROBILINOGEN UA: 0.2

## 2013-09-15 MED ORDER — CEPHALEXIN 500 MG PO CAPS: 500.0000 mg | ORAL_CAPSULE | Freq: Two times a day (BID) | ORAL | Status: DC

## 2013-09-15 NOTE — Telephone Encounter (Signed)
Patient informed, understood & agreed/SLS  

## 2013-09-15 NOTE — Telephone Encounter (Signed)
Discard result note comments.  I will empirically send in Keflex BID x 7 days.  Will call when culture results are in.  Patient to take a probiotic while on medication.

## 2013-09-17 LAB — CULTURE, URINE COMPREHENSIVE: Colony Count: >=100000

## 2013-09-22 ENCOUNTER — Encounter: Payer: Self-pay | Admitting: Radiation Oncology

## 2013-09-24 ENCOUNTER — Encounter: Payer: Self-pay | Admitting: Radiation Oncology

## 2013-09-24 ENCOUNTER — Ambulatory Visit
Admission: RE | Admit: 2013-09-24 | Discharge: 2013-09-24 | Disposition: A | Discharge status: Home / Self Care | Payer: Medicare Other | Source: Ambulatory Visit | Attending: Radiation Oncology | Admitting: Radiation Oncology

## 2013-09-24 VITALS — BP 150/78 | HR 60 | Temp 97.6°F | Ht 71.0 in | Wt 183.3 lb

## 2013-09-24 DIAGNOSIS — C3410 Malignant neoplasm of upper lobe, unspecified bronchus or lung: Secondary | ICD-10-CM

## 2013-09-24 NOTE — Progress Notes (Signed)
Radiation Oncology         [phone] ________________________________  Name: Jodi Frazier MRN: [id number]  Date: 09/24/2013  DOB: [birth year]  Follow-Up Visit Note  outpatient  CC: Leeanne Rio, PA-C  Gaye Pollack, MD  Diagnosis and Prior Radiotherapy:   DIAGNOSIS: Left lower lung poorly differentiated adenocarcinoma, T2aN1M0 STAGE IIA, with new right upper lung nodule    TREATMENT DATES: 08-20-13 to 08-23-13  SITE/DOSE: right upper lung nodule / 54 Gy in 3 fractions   ALSO- s/p external radiation to 70 Gy through 11/20/12 (she opted NOT to receive chemotherapy)    Narrative:  The patient returns today for routine follow-up. She is traveling by W/C, and states that she tires easily when ambulating. She denies any pain presently. She does feel better than a month ago. Weight increasing, eating well, no new respiratory issues. No new cough. No hemoptysis. She broke a left lower rib which is painful. Has lesser chest wall pain in the lateral mid right rib cage, a little lower than where the RT was given. Recent UTI and taking Keflex                              ALLERGIES:  is allergic to codeine and augmentin.  Meds: Current Outpatient Prescriptions  Medication Sig Dispense Refill  . acetaminophen (TYLENOL) 325 MG tablet Take 650 mg by mouth every 6 (six) hours as needed.      Marland Kitchen albuterol (PROVENTIL HFA;VENTOLIN HFA) 108 (90 BASE) MCG/ACT inhaler Inhale 2 puffs into the lungs every 6 (six) hours as needed for wheezing.  1 Inhaler  3  . ALPRAZolam (XANAX) 0.25 MG tablet Take 1 tablet (0.25 mg total) by mouth 2 (two) times daily as needed for anxiety.  30 tablet  0  . amitriptyline (ELAVIL) 25 MG tablet Take 1 tablet (25 mg total) by mouth at bedtime.  90 tablet  0  . amLODipine (NORVASC) 2.5 MG tablet Take 1 tablet (2.5 mg total) by mouth daily.  90 tablet  0  . aspirin EC 81 MG tablet Take 81 mg by mouth daily.      Marland Kitchen BIOTIN PO Take by mouth daily.      . carvedilol  (COREG) 6.25 MG tablet Take 1 tablet (6.25 mg total) by mouth 2 (two) times daily with a meal.  180 tablet  0  . cephALEXin (KEFLEX) 500 MG capsule Take 1 capsule (500 mg total) by mouth 2 (two) times daily.  14 capsule  0  . cloNIDine (CATAPRES) 0.1 MG tablet Take 0.1 mg by mouth 2 (two) times daily as needed.       . cyanocobalamin (,VITAMIN B-12,) 1000 MCG/ML injection Inject 1 mL (1,000 mcg total) into the muscle every 30 (thirty) days.  10 mL  0  . cycloSPORINE (RESTASIS) 0.05 % ophthalmic emulsion Place 1 drop into both eyes 2 (two) times daily.       Marland Kitchen donepezil (ARICEPT) 10 MG tablet Take 1.5 tablets (15 mg total) by mouth at bedtime. Takes 1 and 1/2  45 tablet  0  . escitalopram (LEXAPRO) 10 MG tablet Take 1 tablet (10 mg total) by mouth daily.  90 tablet  0  . Fluticasone Furoate-Vilanterol (BREO ELLIPTA) 100-25 MCG/INH AEPB Inhale 1 puff into the lungs daily.  3 each  0  . HYDROcodone-acetaminophen (NORCO/VICODIN) 5-325 MG per tablet Take 1-2 tablets by mouth every 6 (six) hours as needed for  moderate pain.  30 tablet  0  . levothyroxine (SYNTHROID) 125 MCG tablet Take 1 tablet (125 mcg total) by mouth daily before breakfast.  90 tablet  0  . meclizine (ANTIVERT) 50 MG tablet Take 0.5 tablets (25 mg total) by mouth 3 (three) times daily.  135 tablet  0  . mesalamine (LIALDA) 1.2 G EC tablet Take 1.2 g by mouth 2 (two) times daily.       . midodrine (PROAMATINE) 2.5 MG tablet Take 1 tablet (2.5 mg total) by mouth 2 (two) times daily with a meal.  180 tablet  0  . nitroGLYCERIN (NITROSTAT) 0.4 MG SL tablet Place 0.4 mg under the tongue every 5 (five) minutes as needed for chest pain.       . pantoprazole (PROTONIX) 40 MG tablet Take 1 tablet (40 mg total) by mouth every morning.  30 tablet  0  . Probiotic Product (PROBIOTIC DAILY PO) Take by mouth daily.       No current facility-administered medications for this encounter.    Physical Findings: The patient is in no acute distress.  Patient is alert and oriented.  height is 5\' 11"  (1.803 m) and weight is 183 lb 4.8 oz (83.144 kg). Her temperature is 97.6 F (36.4 C). Her blood pressure is 150/78 and her pulse is 60. Marland Kitchen    General: Alert and oriented, in no acute distress HEENT: Head is normocephalic. Pupils asymmetric, stable Heart: Regular in rate and rhythm with no murmurs, rubs, or gallops. Chest: Clear to auscultation bilaterally, with no rhonchi, wheezes, or rales. Psychiatric: Judgment and insight are intact. Affect is appropriate.   Lab Findings: Lab Results  Component Value Date   WBC 7.6 07/17/2013   HGB 15.3* 07/17/2013   HCT 45.0 07/17/2013   MCV 88.1 07/17/2013   PLT 334 07/17/2013    Radiographic Findings: No results found.  Impression/Plan: Stable to improved symptomatically.  Tolerated RT well.  Last chest imaging was end of Dec. She is due for new imaging to follow findings on last PET scan. Will order PET to be done soon  - next available. Pt wishes to be called with results (may also speak with York Cerise, daughter). Determine timing of followup based on imaging. She is pleased with this plan.  _____________________________________   Eppie Gibson, MD

## 2013-09-24 NOTE — Addendum Note (Signed)
Encounter addended by: Eppie Gibson, MD on: 09/24/2013  9:04 AM<BR>     Documentation filed: Notes Section

## 2013-09-24 NOTE — Progress Notes (Signed)
[facility] End of Treatment Note  Name:Jodi Frazier  Date: 08/23/2013 TYO:[id number] DOB:[birth year]   Status:outpatient    DIAGNOSIS: Left lower lung poorly differentiated adenocarcinoma, T2aN1M0 STAGE IIA, with new right upper lung nodule   INDICATION FOR TREATMENT: Curative   TREATMENT DATES:  08-20-13 to 08-23-13                         SITE/DOSE: right upper lung nodule / 54 Gy in 3 fractions                           BEAMS/ENERGY:      SBRT 3D conformal / 6 FFF MV photons             NARRATIVE:     She tolerated SBRT well without any complications.                       PLAN: Routine followup in one month. Patient instructed to call if questions or worsening complaints in interim.  -----------------------------------  Eppie Gibson, MD

## 2013-09-24 NOTE — Progress Notes (Signed)
Jodi Frazier here for reassessment s/p radiation therapy. She is traveling by W/C,  and states that she tires easily when ambulating.  She denies any pain presently.  She appears fatigued and pale today.  Recent UTI and taking Keflex.Marland Kitchen

## 2013-09-27 ENCOUNTER — Telehealth: Payer: Self-pay | Admitting: *Deleted

## 2013-09-27 NOTE — Telephone Encounter (Signed)
Message copied by Ronny Flurry on Mon Sep 27, 2013  3:01 PM ------      Message from: O'SULLIVAN, MELISSA      Created: Fri Sep 17, 2013  3:22 PM       Please notify pt that urine culture + for e.coli. Should be sensitive to the keflex that she was given. Call if symptoms worsen or if not improved in 3 days. ------

## 2013-09-27 NOTE — Telephone Encounter (Signed)
Patient needs to come in for a visit.

## 2013-09-27 NOTE — Telephone Encounter (Signed)
Notified pt. She states her symptoms of urinary incontinence and aching all over still persist. Please advise.

## 2013-09-27 NOTE — Telephone Encounter (Signed)
Left message on home # to return my call. 

## 2013-09-27 NOTE — Telephone Encounter (Signed)
CALLED PATIENT TO INFORM OF TEST, LVM FOR A RETURN CALL

## 2013-10-02 ENCOUNTER — Encounter (HOSPITAL_COMMUNITY): Payer: Self-pay | Admitting: Emergency Medicine

## 2013-10-02 ENCOUNTER — Emergency Department (HOSPITAL_COMMUNITY)
Admission: EM | Admit: 2013-10-02 | Discharge: 2013-10-02 | Disposition: A | Discharge status: Home / Self Care | Payer: Medicare Other | Attending: Emergency Medicine | Admitting: Emergency Medicine

## 2013-10-02 DIAGNOSIS — Z880 Allergy status to penicillin: Secondary | ICD-10-CM | POA: Insufficient documentation

## 2013-10-02 DIAGNOSIS — Z9849 Cataract extraction status, unspecified eye: Secondary | ICD-10-CM | POA: Insufficient documentation

## 2013-10-02 DIAGNOSIS — K219 Gastro-esophageal reflux disease without esophagitis: Secondary | ICD-10-CM | POA: Insufficient documentation

## 2013-10-02 DIAGNOSIS — I252 Old myocardial infarction: Secondary | ICD-10-CM | POA: Insufficient documentation

## 2013-10-02 DIAGNOSIS — J441 Chronic obstructive pulmonary disease with (acute) exacerbation: Secondary | ICD-10-CM | POA: Insufficient documentation

## 2013-10-02 DIAGNOSIS — F329 Major depressive disorder, single episode, unspecified: Secondary | ICD-10-CM | POA: Insufficient documentation

## 2013-10-02 DIAGNOSIS — R5381 Other malaise: Secondary | ICD-10-CM | POA: Insufficient documentation

## 2013-10-02 DIAGNOSIS — E039 Hypothyroidism, unspecified: Secondary | ICD-10-CM | POA: Insufficient documentation

## 2013-10-02 DIAGNOSIS — Z8742 Personal history of other diseases of the female genital tract: Secondary | ICD-10-CM | POA: Insufficient documentation

## 2013-10-02 DIAGNOSIS — Z8701 Personal history of pneumonia (recurrent): Secondary | ICD-10-CM | POA: Insufficient documentation

## 2013-10-02 DIAGNOSIS — Z872 Personal history of diseases of the skin and subcutaneous tissue: Secondary | ICD-10-CM | POA: Insufficient documentation

## 2013-10-02 DIAGNOSIS — Z8619 Personal history of other infectious and parasitic diseases: Secondary | ICD-10-CM | POA: Insufficient documentation

## 2013-10-02 DIAGNOSIS — R5383 Other fatigue: Principal | ICD-10-CM

## 2013-10-02 DIAGNOSIS — Z87440 Personal history of urinary (tract) infections: Secondary | ICD-10-CM | POA: Insufficient documentation

## 2013-10-02 DIAGNOSIS — Z85118 Personal history of other malignant neoplasm of bronchus and lung: Secondary | ICD-10-CM | POA: Insufficient documentation

## 2013-10-02 DIAGNOSIS — Z87442 Personal history of urinary calculi: Secondary | ICD-10-CM | POA: Insufficient documentation

## 2013-10-02 DIAGNOSIS — F3289 Other specified depressive episodes: Secondary | ICD-10-CM | POA: Insufficient documentation

## 2013-10-02 DIAGNOSIS — Z7982 Long term (current) use of aspirin: Secondary | ICD-10-CM | POA: Insufficient documentation

## 2013-10-02 DIAGNOSIS — M129 Arthropathy, unspecified: Secondary | ICD-10-CM | POA: Insufficient documentation

## 2013-10-02 DIAGNOSIS — Z792 Long term (current) use of antibiotics: Secondary | ICD-10-CM | POA: Insufficient documentation

## 2013-10-02 DIAGNOSIS — Z9861 Coronary angioplasty status: Secondary | ICD-10-CM | POA: Insufficient documentation

## 2013-10-02 DIAGNOSIS — Z923 Personal history of irradiation: Secondary | ICD-10-CM | POA: Insufficient documentation

## 2013-10-02 DIAGNOSIS — R32 Unspecified urinary incontinence: Secondary | ICD-10-CM | POA: Insufficient documentation

## 2013-10-02 DIAGNOSIS — I10 Essential (primary) hypertension: Secondary | ICD-10-CM | POA: Insufficient documentation

## 2013-10-02 DIAGNOSIS — I2510 Atherosclerotic heart disease of native coronary artery without angina pectoris: Secondary | ICD-10-CM | POA: Insufficient documentation

## 2013-10-02 DIAGNOSIS — E7800 Pure hypercholesterolemia, unspecified: Secondary | ICD-10-CM | POA: Insufficient documentation

## 2013-10-02 DIAGNOSIS — IMO0002 Reserved for concepts with insufficient information to code with codable children: Secondary | ICD-10-CM | POA: Insufficient documentation

## 2013-10-02 DIAGNOSIS — F0390 Unspecified dementia without behavioral disturbance: Secondary | ICD-10-CM | POA: Insufficient documentation

## 2013-10-02 DIAGNOSIS — Z9181 History of falling: Secondary | ICD-10-CM | POA: Insufficient documentation

## 2013-10-02 DIAGNOSIS — Z79899 Other long term (current) drug therapy: Secondary | ICD-10-CM | POA: Insufficient documentation

## 2013-10-02 DIAGNOSIS — Z87891 Personal history of nicotine dependence: Secondary | ICD-10-CM | POA: Insufficient documentation

## 2013-10-02 LAB — COMPREHENSIVE METABOLIC PANEL
ALK PHOS: 125 U/L — AB (ref 39–117)
ALT: 12 U/L (ref 0–35)
AST: 16 U/L (ref 0–37)
Albumin: 3.1 g/dL — ABNORMAL LOW (ref 3.5–5.2)
BUN: 12 mg/dL (ref 6–23)
CALCIUM: 9.7 mg/dL (ref 8.4–10.5)
CO2: 23 mEq/L (ref 19–32)
Chloride: 103 mEq/L (ref 96–112)
Creatinine, Ser: 0.93 mg/dL (ref 0.50–1.10)
GFR calc Af Amer: 66 mL/min — ABNORMAL LOW (ref >90)
GFR calc non Af Amer: 57 mL/min — ABNORMAL LOW (ref >90)
Glucose, Bld: 99 mg/dL (ref 70–99)
Potassium: 4 mEq/L (ref 3.7–5.3)
SODIUM: 137 meq/L (ref 137–147)
TOTAL PROTEIN: 6.5 g/dL (ref 6.0–8.3)
Total Bilirubin: 0.3 mg/dL (ref 0.3–1.2)

## 2013-10-02 LAB — CBC WITH DIFFERENTIAL/PLATELET
BASOS PCT: 1 % (ref 0–1)
Basophils Absolute: 0.1 10*3/uL (ref 0.0–0.1)
EOS ABS: 0.2 10*3/uL (ref 0.0–0.7)
EOS PCT: 2 % (ref 0–5)
HCT: 36.6 % (ref 36.0–46.0)
Hemoglobin: 11.9 g/dL — ABNORMAL LOW (ref 12.0–15.0)
Lymphocytes Relative: 13 % (ref 12–46)
Lymphs Abs: 1 10*3/uL (ref 0.7–4.0)
MCH: 27 pg (ref 26.0–34.0)
MCHC: 32.5 g/dL (ref 30.0–36.0)
MCV: 83.2 fL (ref 78.0–100.0)
Monocytes Absolute: 0.9 10*3/uL (ref 0.1–1.0)
Monocytes Relative: 11 % (ref 3–12)
Neutro Abs: 5.8 10*3/uL (ref 1.7–7.7)
Neutrophils Relative %: 72 % (ref 43–77)
PLATELETS: 301 10*3/uL (ref 150–400)
RBC: 4.4 MIL/uL (ref 3.87–5.11)
RDW: 15.3 % (ref 11.5–15.5)
WBC: 8 10*3/uL (ref 4.0–10.5)

## 2013-10-02 LAB — URINALYSIS, ROUTINE W REFLEX MICROSCOPIC
Bilirubin Urine: NEGATIVE
GLUCOSE, UA: NEGATIVE mg/dL
Hgb urine dipstick: NEGATIVE
KETONES UR: NEGATIVE mg/dL
Nitrite: NEGATIVE
PH: 6 (ref 5.0–8.0)
Protein, ur: NEGATIVE mg/dL
SPECIFIC GRAVITY, URINE: 1.011 (ref 1.005–1.030)
Urobilinogen, UA: 0.2 mg/dL (ref 0.0–1.0)

## 2013-10-02 LAB — TROPONIN I: Troponin I: <0.3 ng/mL (ref <0.30)

## 2013-10-02 LAB — URINE MICROSCOPIC-ADD ON

## 2013-10-02 NOTE — ED Notes (Addendum)
Per EMS report: Pt awoke this morning and was dizzy. Pt has history of vertigo associated with chemotherapy treatments. Pt went to a urgent care today and was told she was orthostatic. Pt has had four episodes of UTI since December, which a culture showed E. coli, which she has been treated with antibiotics. Pt is A/O x4.

## 2013-10-02 NOTE — ED Provider Notes (Signed)
CSN: [id number]     Arrival date & time 10/02/13  1526 History   First MD Initiated Contact with Patient 10/02/13 1532     Chief Complaint  Patient presents with  . Dizziness     (Consider location/radiation/quality/duration/timing/severity/associated sxs/prior Treatment) Patient is a 80 y.o. female presenting with general illness.  Illness Quality:  "I just don't feel right" Severity:  Moderate Onset quality:  Gradual Duration:  1 day Timing:  Constant Progression:  Partially resolved Chronicity:  New Context:  Hx of lung cancer Relieved by:  Tylenol, resting Associated symptoms: myalgias and shortness of breath (no worse than usual)   Associated symptoms: no abdominal pain, no chest pain, no diarrhea, no fever, no nausea and no vomiting   Associated symptoms comment:  Urinary incontinence worse than usual.   Past Medical History  Diagnosis Date  . Hypertension   . Colitis   . Coronary artery disease   . High cholesterol     takes Red Yeast Rice and Fish OIl daily  . Kidney stones   . PONV (postoperative nausea and vomiting)   . MI (myocardial infarction) 2010  . Shortness of breath     with exertion  . Pneumonia     hx of 20+yrs ago  . COPD (chronic obstructive pulmonary disease)     pleurisy or COPD exascerbation > 51yr ago  . Headache(784.0)   . Vertigo     HTN related bc gets up too fast  . Arthritis   . Bruises easily   . History of shingles 49yrs ago    in eye;occasionally gets some residual   . Accessory skin tags     arms/legs  . GERD (gastroesophageal reflux disease)   . Colitis   . Diverticulosis   . Urinary frequency   . Stress incontinence   . History of kidney stones   . Hypothyroidism     takes sYnthroid daily  . Cataract     early stage on right  . Macular degeneration, wet   . Depression     some but takes Amitryptylline nightly  . Right-sided chest wall pain 08/28/12    PRESENTATION - RIGHT SIDED ANTERIOR CHEST PAIN  . Weight loss  08/28/12    10 LB WEIGHT LOSS OVER 3 MONTHS  . Lung mass 08/12/12    CHEST-XRAY/ PET -LOBULAR MASS LLL - 4.1 X 3.6 X 4.2 CM  . Dementia     mild  . S/P radiation therapy  10/05/2012-11/20/2012    Left Lower Lung and hilum / 70 Gy in 35 fractions  . Adenocarcinoma of lung 09/07/12    needle core bx-LLL-adenocarcinoma  . Hypertension   . At risk for fall due to comorbid condition     5 falls - last Early December  . Vertigo   . Orthostatic hypotension   . S/P radiation therapy 2/13, 2/20, 2/23 (2015)    SBRT to Right Lung   Past Surgical History  Procedure Laterality Date  . Hip arthroscopy  42yr ago    right hip-replacement  . Knee arthroscopy      LT  . Total knee arthroplasty  38yrs ago    left  . Appendectomy  30yrs ago  . Abdominal hysterectomy  47yrs ago   . Lithotripsy  2011  . Cataract surgery  2012    left  . Eye surgery  63yrs ago  . Bladder surgery  2001    tacked  . Coronary angioplasty with stent placement  2010  2 in rt coronary artery and 1 in another spot  . Colonoscopy    . Esophagogastroduodenoscopy    . Total knee arthroplasty  09/09/2011    Procedure: TOTAL KNEE ARTHROPLASTY;  Surgeon: Rudean Haskell, MD;  Location: [facility];  Service: Orthopedics;  Laterality: Right;  Right Total Knee Arthroplasty  . Bronchial brush biopsy Left 08/17/12    LLL Bronchial Washing / Brushing and Bronchial Biopsy: Negative for malignancy  . Left lower lobe needle core biopsy Left 09/07/12    Poorly Differentiated Adenocarcinoma   . Joint replacement     Family History  Problem Relation Age of Onset  . Anesthesia problems Mother   . Hypotension Neg Hx   . Malignant hyperthermia Neg Hx   . Pseudochol deficiency Neg Hx   . Cancer Maternal Aunt     breast   History  Substance Use Topics  . Smoking status: Former Smoker -- 1.00 packs/day for 40 years    Types: Cigarettes    Quit date: 08/26/2002  . Smokeless tobacco: Never Used     Comment: quit several yrs ago  .  Alcohol Use: Yes     Comment: 2 times week alcohol   OB History   Grav Para Term Preterm Abortions TAB SAB Ect Mult Living                 Review of Systems  Constitutional: Negative for fever.  Respiratory: Positive for shortness of breath (no worse than usual).   Cardiovascular: Negative for chest pain.  Gastrointestinal: Negative for nausea, vomiting, abdominal pain and diarrhea.  Musculoskeletal: Positive for myalgias.  Neurological: Positive for dizziness ("vertigo").  All other systems reviewed and are negative.      Allergies  Codeine; Augmentin; and Penicillins  Home Medications   Current Outpatient Rx  Name  Route  Sig  Dispense  Refill  . acetaminophen (TYLENOL) 325 MG tablet   Oral   Take 650 mg by mouth every 6 (six) hours as needed.         Marland Kitchen albuterol (PROVENTIL HFA;VENTOLIN HFA) 108 (90 BASE) MCG/ACT inhaler   Inhalation   Inhale 2 puffs into the lungs every 6 (six) hours as needed for wheezing.   1 Inhaler   3   . ALPRAZolam (XANAX) 0.25 MG tablet   Oral   Take 1 tablet (0.25 mg total) by mouth 2 (two) times daily as needed for anxiety.   30 tablet   0   . amitriptyline (ELAVIL) 25 MG tablet   Oral   Take 1 tablet (25 mg total) by mouth at bedtime.   90 tablet   0   . amLODipine (NORVASC) 2.5 MG tablet   Oral   Take 1 tablet (2.5 mg total) by mouth daily.   90 tablet   0   . aspirin EC 81 MG tablet   Oral   Take 81 mg by mouth daily.         Marland Kitchen BIOTIN PO   Oral   Take by mouth daily.         . carvedilol (COREG) 6.25 MG tablet   Oral   Take 1 tablet (6.25 mg total) by mouth 2 (two) times daily with a meal.   180 tablet   0   . cephALEXin (KEFLEX) 500 MG capsule   Oral   Take 1 capsule (500 mg total) by mouth 2 (two) times daily.   14 capsule   0   . cloNIDine (CATAPRES)  0.1 MG tablet   Oral   Take 0.1 mg by mouth 2 (two) times daily as needed.          . cyanocobalamin (,VITAMIN B-12,) 1000 MCG/ML injection    Intramuscular   Inject 1 mL (1,000 mcg total) into the muscle every 30 (thirty) days.   10 mL   0   . cycloSPORINE (RESTASIS) 0.05 % ophthalmic emulsion   Both Eyes   Place 1 drop into both eyes 2 (two) times daily.          Marland Kitchen donepezil (ARICEPT) 10 MG tablet   Oral   Take 1.5 tablets (15 mg total) by mouth at bedtime. Takes 1 and 1/2   45 tablet   0   . escitalopram (LEXAPRO) 10 MG tablet   Oral   Take 1 tablet (10 mg total) by mouth daily.   90 tablet   0   . Fluticasone Furoate-Vilanterol (BREO ELLIPTA) 100-25 MCG/INH AEPB   Inhalation   Inhale 1 puff into the lungs daily.   3 each   0     Dispense 90-day supply: 3 inhalers   . HYDROcodone-acetaminophen (NORCO/VICODIN) 5-325 MG per tablet   Oral   Take 1-2 tablets by mouth every 6 (six) hours as needed for moderate pain.   30 tablet   0   . levothyroxine (SYNTHROID) 125 MCG tablet   Oral   Take 1 tablet (125 mcg total) by mouth daily before breakfast.   90 tablet   0   . meclizine (ANTIVERT) 50 MG tablet   Oral   Take 0.5 tablets (25 mg total) by mouth 3 (three) times daily.   135 tablet   0   . mesalamine (LIALDA) 1.2 G EC tablet   Oral   Take 1.2 g by mouth 2 (two) times daily.          . midodrine (PROAMATINE) 2.5 MG tablet   Oral   Take 1 tablet (2.5 mg total) by mouth 2 (two) times daily with a meal.   180 tablet   0   . nitroGLYCERIN (NITROSTAT) 0.4 MG SL tablet   Sublingual   Place 0.4 mg under the tongue every 5 (five) minutes as needed for chest pain.          . pantoprazole (PROTONIX) 40 MG tablet   Oral   Take 1 tablet (40 mg total) by mouth every morning.   30 tablet   0   . Probiotic Product (PROBIOTIC DAILY PO)   Oral   Take by mouth daily.          BP 168/64  Pulse 66  Temp(Src) 97.7 F (36.5 C) (Oral)  Resp 22  SpO2 100% Physical Exam  Nursing note and vitals reviewed. Constitutional: She is oriented to person, place, and time. She appears well-developed and  well-nourished. No distress.  HENT:  Head: Normocephalic and atraumatic.  Mouth/Throat: Oropharynx is clear and moist.  Eyes: Conjunctivae are normal. No scleral icterus. Pupils are unequal (right pupil is constrictive and not reactive - reported as old).  Neck: Neck supple.  Cardiovascular: Normal rate, regular rhythm, normal heart sounds and intact distal pulses.   No murmur heard. Pulmonary/Chest: Effort normal and breath sounds normal. No stridor. No respiratory distress. She has no rales.  Abdominal: Soft. Bowel sounds are normal. She exhibits no distension. There is no tenderness.  Musculoskeletal: Normal range of motion.  Neurological: She is alert and oriented to person, place, and time.  Skin: Skin is warm and dry. No rash noted.  Psychiatric: She has a normal mood and affect. Her behavior is normal.    ED Course  Procedures (including critical care time) Labs Review Labs Reviewed  CBC WITH DIFFERENTIAL - Abnormal; Notable for the following:    Hemoglobin 11.9 (*)    All other components within normal limits  COMPREHENSIVE METABOLIC PANEL - Abnormal; Notable for the following:    Albumin 3.1 (*)    Alkaline Phosphatase 125 (*)    GFR calc non Af Amer 57 (*)    GFR calc Af Amer 66 (*)    All other components within normal limits  URINALYSIS, ROUTINE W REFLEX MICROSCOPIC - Abnormal; Notable for the following:    Leukocytes, UA TRACE (*)    All other components within normal limits  TROPONIN I  URINE MICROSCOPIC-ADD ON   Imaging Review No results found.   EKG Interpretation   Date/Time:  Saturday October 02 2013 15:34:05 EDT Ventricular Rate:  65 PR Interval:  184 QRS Duration: 84 QT Interval:  428 QTC Calculation: 445 R Axis:   82 Text Interpretation:  Sinus rhythm Borderline right axis deviation  Nonspecific T abnormalities, lateral leads No significant change was found  Confirmed by [facility]  MD, TREY ([id number]) on 10/02/2013 4:07:25 PM      MDM   Final  diagnoses:  Malaise    80 yo female presenting with the primary complaint of not feeling well.  She is being treated for a UTI, but woke up having wet the bed worse than usual.  She had myalgias this morning after waking up, but this resolved after tylenol, eating breakfast, and taking a nap.  Since then, she has not felt well, but cannot pinpoint specific symptoms.  She went to an urgent care and was referred here because of her blood pressure.  She is not sure what her blood pressure was at that time.  Now, she feels better, but still not her usual self.  Plan screening labs, EKG, UA.    Workup was unremarkable. Patient stated she felt much better. She appeared stable for discharge and she was comfortable with that plan.  Houston Siren III, MD 10/02/13 2200

## 2013-10-02 NOTE — ED Notes (Addendum)
MD [facility] at bedside speaking with family.

## 2013-10-02 NOTE — ED Notes (Signed)
[facility] EDP notified of pt elevated BP and that pt denies dizziness throughout orthostatic VS testing.

## 2013-10-04 ENCOUNTER — Ambulatory Visit: Payer: Medicare Other | Admitting: Physician Assistant

## 2013-10-04 NOTE — Telephone Encounter (Signed)
Pt was seen in the ER on 10/02/13.  Please call pt to arrange follow up.

## 2013-10-05 ENCOUNTER — Encounter (HOSPITAL_COMMUNITY): Payer: Medicare Other

## 2013-10-05 NOTE — Telephone Encounter (Signed)
Appointment scheduled for 10/15/13

## 2013-10-12 ENCOUNTER — Encounter (HOSPITAL_COMMUNITY)
Admission: RE | Admit: 2013-10-12 | Discharge: 2013-10-12 | Disposition: A | Discharge status: Home / Self Care | Payer: Medicare Other | Source: Ambulatory Visit | Attending: Radiation Oncology | Admitting: Radiation Oncology

## 2013-10-12 DIAGNOSIS — C3410 Malignant neoplasm of upper lobe, unspecified bronchus or lung: Secondary | ICD-10-CM

## 2013-10-12 LAB — GLUCOSE, CAPILLARY: GLUCOSE-CAPILLARY: 93 mg/dL (ref 70–99)

## 2013-10-12 MED ORDER — FLUDEOXYGLUCOSE F - 18 (FDG) INJECTION
10.4000 | Freq: Once | INTRAVENOUS | Status: AC | PRN
  Administered 2013-10-12: 10.4 via INTRAVENOUS

## 2013-10-13 ENCOUNTER — Telehealth: Payer: Self-pay | Admitting: *Deleted

## 2013-10-13 ENCOUNTER — Encounter: Payer: Self-pay | Admitting: Radiation Oncology

## 2013-10-13 ENCOUNTER — Other Ambulatory Visit: Payer: Self-pay | Admitting: Radiation Oncology

## 2013-10-13 DIAGNOSIS — C3410 Malignant neoplasm of upper lobe, unspecified bronchus or lung: Secondary | ICD-10-CM

## 2013-10-13 DIAGNOSIS — J189 Pneumonia, unspecified organism: Secondary | ICD-10-CM

## 2013-10-13 MED ORDER — AZITHROMYCIN 500 MG PO TABS: 500.0000 mg | ORAL_TABLET | Freq: Every day | ORAL | Status: DC

## 2013-10-13 NOTE — Telephone Encounter (Signed)
Called patient's daughter, Jodi Frazier to inform of appts. For her mom. lvm for a return call

## 2013-10-13 NOTE — Progress Notes (Signed)
Spoke with pt's daughter, York Cerise, about PET results. Will start Azithromycin empirically for possible small lung infection. Followup in 3 months with repeat Chest CT. She is pleased with this plan and will discuss with her mom.  -----------------------------------  Eppie Gibson, MD

## 2013-10-15 ENCOUNTER — Ambulatory Visit (INDEPENDENT_AMBULATORY_CARE_PROVIDER_SITE_OTHER): Payer: Medicare Other | Admitting: Physician Assistant

## 2013-10-15 ENCOUNTER — Encounter: Payer: Self-pay | Admitting: Physician Assistant

## 2013-10-15 VITALS — BP 140/72 | HR 72 | Temp 97.6°F | Resp 16 | Ht 71.0 in | Wt 177.5 lb

## 2013-10-15 DIAGNOSIS — R82998 Other abnormal findings in urine: Secondary | ICD-10-CM

## 2013-10-15 DIAGNOSIS — K219 Gastro-esophageal reflux disease without esophagitis: Secondary | ICD-10-CM

## 2013-10-15 DIAGNOSIS — R350 Frequency of micturition: Secondary | ICD-10-CM

## 2013-10-15 DIAGNOSIS — I951 Orthostatic hypotension: Secondary | ICD-10-CM

## 2013-10-15 DIAGNOSIS — F419 Anxiety disorder, unspecified: Secondary | ICD-10-CM

## 2013-10-15 DIAGNOSIS — R309 Painful micturition, unspecified: Secondary | ICD-10-CM

## 2013-10-15 DIAGNOSIS — R300 Dysuria: Secondary | ICD-10-CM

## 2013-10-15 DIAGNOSIS — E039 Hypothyroidism, unspecified: Secondary | ICD-10-CM

## 2013-10-15 DIAGNOSIS — F411 Generalized anxiety disorder: Secondary | ICD-10-CM

## 2013-10-15 DIAGNOSIS — N390 Urinary tract infection, site not specified: Secondary | ICD-10-CM

## 2013-10-15 DIAGNOSIS — F0390 Unspecified dementia without behavioral disturbance: Secondary | ICD-10-CM

## 2013-10-15 DIAGNOSIS — I10 Essential (primary) hypertension: Secondary | ICD-10-CM

## 2013-10-15 DIAGNOSIS — R8290 Unspecified abnormal findings in urine: Secondary | ICD-10-CM

## 2013-10-15 LAB — POCT URINALYSIS DIPSTICK
Bilirubin, UA: NEGATIVE
GLUCOSE UA: NEGATIVE
Ketones, UA: NEGATIVE
NITRITE UA: NEGATIVE
PROTEIN UA: NEGATIVE
UROBILINOGEN UA: 0.2
pH, UA: 6

## 2013-10-15 MED ORDER — MECLIZINE HCL 50 MG PO TABS: 25.0000 mg | ORAL_TABLET | Freq: Three times a day (TID) | ORAL | Status: DC

## 2013-10-15 MED ORDER — LEVOTHYROXINE SODIUM 125 MCG PO TABS: 125.0000 ug | ORAL_TABLET | Freq: Every day | ORAL | Status: DC

## 2013-10-15 MED ORDER — AMITRIPTYLINE HCL 25 MG PO TABS: 25.0000 mg | ORAL_TABLET | Freq: Every day | ORAL | Status: DC

## 2013-10-15 MED ORDER — DONEPEZIL HCL 10 MG PO TABS: 15.0000 mg | ORAL_TABLET | Freq: Every day | ORAL | Status: DC

## 2013-10-15 MED ORDER — CEFUROXIME AXETIL 250 MG PO TABS: 250.0000 mg | ORAL_TABLET | Freq: Two times a day (BID) | ORAL | Status: DC

## 2013-10-15 MED ORDER — MIDODRINE HCL 2.5 MG PO TABS: 2.5000 mg | ORAL_TABLET | Freq: Two times a day (BID) | ORAL | Status: DC

## 2013-10-15 MED ORDER — PANTOPRAZOLE SODIUM 40 MG PO TBEC: 40.0000 mg | DELAYED_RELEASE_TABLET | Freq: Every morning | ORAL | Status: DC

## 2013-10-15 MED ORDER — AMLODIPINE BESYLATE 2.5 MG PO TABS: 2.5000 mg | ORAL_TABLET | Freq: Every day | ORAL | Status: DC

## 2013-10-15 MED ORDER — ESCITALOPRAM OXALATE 10 MG PO TABS: 10.0000 mg | ORAL_TABLET | Freq: Every day | ORAL | Status: DC

## 2013-10-15 MED ORDER — FLUTICASONE FUROATE-VILANTEROL 100-25 MCG/INH IN AEPB: 1.0000 | INHALATION_SPRAY | Freq: Every day | RESPIRATORY_TRACT | Status: DC

## 2013-10-15 MED ORDER — CARVEDILOL 6.25 MG PO TABS: 6.2500 mg | ORAL_TABLET | Freq: Two times a day (BID) | ORAL | Status: DC

## 2013-10-15 MED ORDER — CYCLOSPORINE 0.05 % OP EMUL
1.0000 [drp] | Freq: Two times a day (BID) | OPHTHALMIC | Status: DC
Start: 2013-10-15 — End: 2015-07-27

## 2013-10-15 NOTE — Assessment & Plan Note (Signed)
Will send for culture.  Rx Ceftin.  Increase fluids.  Probiotic and cranberry supplement.  Referral placed to Urology.

## 2013-10-15 NOTE — Progress Notes (Signed)
Patient presents to clinic today for ER follow-up.  Patient was seen a few days ago in the ER for malaise.  Workup was negative and the patient's symptoms resolved on their own.  Patient presents today c/o urinary urgency, urinary frequency, dysuria x 1 day.  Patient denies fever, chills, hematuria, nausea or vomiting.. Patient with recurrent UTIs, likely stemming from cancer treatments.  Patient not currently followed by Urology.    Patient needing refills of medication.  Past Medical History  Diagnosis Date  . Hypertension   . Colitis   . Coronary artery disease   . High cholesterol     takes Red Yeast Rice and Fish OIl daily  . Kidney stones   . PONV (postoperative nausea and vomiting)   . MI (myocardial infarction) 2010  . Shortness of breath     with exertion  . Pneumonia     hx of 20+yrs ago  . COPD (chronic obstructive pulmonary disease)     pleurisy or COPD exascerbation > 85yrago  . Headache(784.0)   . Vertigo     HTN related bc gets up too fast  . Arthritis   . Bruises easily   . History of shingles 143yrago    in eye;occasionally gets some residual   . Accessory skin tags     arms/legs  . GERD (gastroesophageal reflux disease)   . Colitis   . Diverticulosis   . Urinary frequency   . Stress incontinence   . History of kidney stones   . Hypothyroidism     takes sYnthroid daily  . Cataract     early stage on right  . Macular degeneration, wet   . Depression     some but takes Amitryptylline nightly  . Right-sided chest wall pain 08/28/12    PRESENTATION - RIGHT SIDED ANTERIOR CHEST PAIN  . Weight loss 08/28/12    10 LB WEIGHT LOSS OVER 3 MONTHS  . Lung mass 08/12/12    CHEST-XRAY/ PET -LOBULAR MASS LLL - 4.1 X 3.6 X 4.2 CM  . Dementia     mild  . S/P radiation therapy  10/05/2012-11/20/2012    Left Lower Lung and hilum / 70 Gy in 35 fractions  . Hypertension   . At risk for fall due to comorbid condition     5 falls - last Early December  . Vertigo   .  Orthostatic hypotension   . S/P radiation therapy 2/13, 2/20, 2/23 (2015)    SBRT to Right Lung  . Adenocarcinoma of lung 09/07/12    needle core bx-LLL-adenocarcinoma    Current Outpatient Prescriptions on File Prior to Visit  Medication Sig Dispense Refill  . acetaminophen (TYLENOL) 325 MG tablet Take 650 mg by mouth every 6 (six) hours as needed (pain).       . Marland Kitchenlbuterol (PROVENTIL HFA;VENTOLIN HFA) 108 (90 BASE) MCG/ACT inhaler Inhale 2 puffs into the lungs every 6 (six) hours as needed for wheezing.  1 Inhaler  3  . ALPRAZolam (XANAX) 0.25 MG tablet Take 1 tablet (0.25 mg total) by mouth 2 (two) times daily as needed for anxiety.  30 tablet  0  . aspirin EC 81 MG tablet Take 81 mg by mouth daily.      . Marland Kitchenzithromycin (ZITHROMAX) 500 MG tablet Take 1 tablet (500 mg total) by mouth daily. Take for 3 days for possible small lung infection.  3 tablet  0  . BIOTIN PO Take 1 tablet by mouth daily.       .Marland Kitchen  cloNIDine (CATAPRES) 0.1 MG tablet Take 0.1 mg by mouth 2 (two) times daily.       . cyanocobalamin (,VITAMIN B-12,) 1000 MCG/ML injection Inject 1 mL (1,000 mcg total) into the muscle every 30 (thirty) days.  10 mL  0  . HYDROcodone-acetaminophen (NORCO/VICODIN) 5-325 MG per tablet Take 1-2 tablets by mouth every 6 (six) hours as needed for moderate pain.  30 tablet  0  . mesalamine (LIALDA) 1.2 G EC tablet Take 1.2 g by mouth 2 (two) times daily.       . nitroGLYCERIN (NITROSTAT) 0.4 MG SL tablet Place 0.4 mg under the tongue every 5 (five) minutes as needed for chest pain.       . Probiotic Product (PROBIOTIC DAILY PO) Take by mouth daily.       No current facility-administered medications on file prior to visit.    Allergies  Allergen Reactions  . Codeine Hives  . Augmentin [Amoxicillin-Pot Clavulanate] Diarrhea and Nausea And Vomiting  . Penicillins Hives    Family History  Problem Relation Age of Onset  . Anesthesia problems Mother   . Hypotension Neg Hx   . Malignant  hyperthermia Neg Hx   . Pseudochol deficiency Neg Hx   . Cancer Maternal Aunt     breast    History   Social History  . Marital Status: Widowed    Spouse Name: N/A    Number of Children: 4  . Years of Education: N/A   Occupational History  . retired     Press photographer   Social History Main Topics  . Smoking status: Former Smoker -- 1.00 packs/day for 40 years    Types: Cigarettes    Quit date: 08/26/2002  . Smokeless tobacco: Never Used     Comment: quit several yrs ago  . Alcohol Use: Yes     Comment: 2 times week alcohol  . Drug Use: No  . Sexual Activity: No   Other Topics Concern  . None   Social History Narrative   Daughter is nurse in neuro ICU   Review of Systems - See HPI.  All other ROS are negative.  BP 140/72  Pulse 72  Temp(Src) 97.6 F (36.4 C) (Oral)  Resp 16  Ht '5\' 11"'  (1.803 m)  Wt 177 lb 8 oz (80.513 kg)  BMI 24.77 kg/m2  SpO2 98%  Physical Exam  Vitals reviewed. Constitutional: She is oriented to person, place, and time and well-developed, well-nourished, and in no distress.  HENT:  Head: Normocephalic and atraumatic.  Eyes: Conjunctivae are normal. Pupils are equal, round, and reactive to light.  Neck: Neck supple.  Cardiovascular: Normal rate, regular rhythm, normal heart sounds and intact distal pulses.   Pulmonary/Chest: Effort normal and breath sounds normal. No respiratory distress. She has no wheezes. She has no rales. She exhibits no tenderness.  Abdominal: Soft. Bowel sounds are normal. There is no tenderness.  Neurological: She is alert and oriented to person, place, and time.  Skin: Skin is warm and dry. No rash noted.  Psychiatric: Affect normal.    Recent Results (from the past 2160 hour(s))  POCT URINALYSIS DIPSTICK     Status: Abnormal   Collection Time    09/15/13  1:29 PM      Result Value Ref Range   Color, UA yellow-orange     Clarity, UA murky     Glucose, UA neg     Bilirubin, UA neg     Ketones, UA neg  Spec Grav, UA 1.025     Blood, UA Moderate     pH, UA 5.0     Protein, UA neg     Urobilinogen, UA 0.2     Nitrite, UA neg     Leukocytes, UA small (1+)    CULTURE, URINE COMPREHENSIVE     Status: None   Collection Time    09/15/13  1:32 PM      Result Value Ref Range   Culture ESCHERICHIA COLI     Colony Count >=100,000 COLONIES/ML     Organism ID, Bacteria ESCHERICHIA COLI    CBC WITH DIFFERENTIAL     Status: Abnormal   Collection Time    10/02/13  4:10 PM      Result Value Ref Range   WBC 8.0  4.0 - 10.5 K/uL   RBC 4.40  3.87 - 5.11 MIL/uL   Hemoglobin 11.9 (*) 12.0 - 15.0 g/dL   HCT 36.6  36.0 - 46.0 %   MCV 83.2  78.0 - 100.0 fL   MCH 27.0  26.0 - 34.0 pg   MCHC 32.5  30.0 - 36.0 g/dL   RDW 15.3  11.5 - 15.5 %   Platelets 301  150 - 400 K/uL   Neutrophils Relative % 72  43 - 77 %   Neutro Abs 5.8  1.7 - 7.7 K/uL   Lymphocytes Relative 13  12 - 46 %   Lymphs Abs 1.0  0.7 - 4.0 K/uL   Monocytes Relative 11  3 - 12 %   Monocytes Absolute 0.9  0.1 - 1.0 K/uL   Eosinophils Relative 2  0 - 5 %   Eosinophils Absolute 0.2  0.0 - 0.7 K/uL   Basophils Relative 1  0 - 1 %   Basophils Absolute 0.1  0.0 - 0.1 K/uL  COMPREHENSIVE METABOLIC PANEL     Status: Abnormal   Collection Time    10/02/13  4:10 PM      Result Value Ref Range   Sodium 137  137 - 147 mEq/L   Potassium 4.0  3.7 - 5.3 mEq/L   Chloride 103  96 - 112 mEq/L   CO2 23  19 - 32 mEq/L   Glucose, Bld 99  70 - 99 mg/dL   BUN 12  6 - 23 mg/dL   Creatinine, Ser 0.93  0.50 - 1.10 mg/dL   Calcium 9.7  8.4 - 10.5 mg/dL   Total Protein 6.5  6.0 - 8.3 g/dL   Albumin 3.1 (*) 3.5 - 5.2 g/dL   AST 16  0 - 37 U/L   ALT 12  0 - 35 U/L   Alkaline Phosphatase 125 (*) 39 - 117 U/L   Total Bilirubin 0.3  0.3 - 1.2 mg/dL   GFR calc non Af Amer 57 (*) >90 mL/min   GFR calc Af Amer 66 (*) >90 mL/min   Comment: (NOTE)     The eGFR has been calculated using the CKD EPI equation.     This calculation has not been validated in  all clinical situations.     eGFR's persistently <90 mL/min signify possible Chronic Kidney     Disease.  TROPONIN I     Status: None   Collection Time    10/02/13  4:10 PM      Result Value Ref Range   Troponin I <0.30  <0.30 ng/mL   Comment:            Due to  the release kinetics of cTnI,     a negative result within the first hours     of the onset of symptoms does not rule out     myocardial infarction with certainty.     If myocardial infarction is still suspected,     repeat the test at appropriate intervals.  URINALYSIS, ROUTINE W REFLEX MICROSCOPIC     Status: Abnormal   Collection Time    10/02/13  4:26 PM      Result Value Ref Range   Color, Urine YELLOW  YELLOW   APPearance CLEAR  CLEAR   Specific Gravity, Urine 1.011  1.005 - 1.030   pH 6.0  5.0 - 8.0   Glucose, UA NEGATIVE  NEGATIVE mg/dL   Hgb urine dipstick NEGATIVE  NEGATIVE   Bilirubin Urine NEGATIVE  NEGATIVE   Ketones, ur NEGATIVE  NEGATIVE mg/dL   Protein, ur NEGATIVE  NEGATIVE mg/dL   Urobilinogen, UA 0.2  0.0 - 1.0 mg/dL   Nitrite NEGATIVE  NEGATIVE   Leukocytes, UA TRACE (*) NEGATIVE  URINE MICROSCOPIC-ADD ON     Status: None   Collection Time    10/02/13  4:26 PM      Result Value Ref Range   Squamous Epithelial / LPF RARE  RARE   WBC, UA 0-2  <3 WBC/hpf  GLUCOSE, CAPILLARY     Status: None   Collection Time    10/12/13  1:00 PM      Result Value Ref Range   Glucose-Capillary 93  70 - 99 mg/dL  POCT URINALYSIS DIPSTICK     Status: Abnormal   Collection Time    10/15/13 11:06 AM      Result Value Ref Range   Color, UA golden     Clarity, UA slighty cloudy     Glucose, UA neg     Bilirubin, UA neg     Ketones, UA neg     Spec Grav, UA <=1.005     Blood, UA trace     pH, UA 6.0     Protein, UA neg     Urobilinogen, UA 0.2     Nitrite, UA neg     Leukocytes, UA moderate (2+)     Assessment/Plan: Recurrent UTI Will send for culture.  Rx Ceftin.  Increase fluids.  Probiotic and cranberry  supplement.  Referral placed to Urology.

## 2013-10-15 NOTE — Patient Instructions (Signed)
Take medications as directed.  Take antibiotic as prescribed.  Increase fluids.  Continue probiotic daily.  You will be contacted by Urology for appointment.  For dizziness -- likely due to fluid behind ears.  Start Flonase daily.  Continue all other medications as directed.  I will call you with your urine culture results.

## 2013-10-15 NOTE — Progress Notes (Signed)
Pre visit review using our clinic review tool, if applicable. No additional management support is needed unless otherwise documented below in the visit note/SLS  

## 2013-10-18 LAB — CULTURE, URINE COMPREHENSIVE

## 2013-11-03 ENCOUNTER — Other Ambulatory Visit: Payer: Self-pay | Admitting: Urology

## 2013-11-03 DIAGNOSIS — D4959 Neoplasm of unspecified behavior of other genitourinary organ: Secondary | ICD-10-CM

## 2013-11-08 ENCOUNTER — Other Ambulatory Visit: Payer: Self-pay | Admitting: Physician Assistant

## 2013-11-08 NOTE — Telephone Encounter (Signed)
Rx request to pharmacy/SLS  

## 2013-11-11 ENCOUNTER — Ambulatory Visit (HOSPITAL_COMMUNITY): Payer: Medicare Other

## 2013-11-12 ENCOUNTER — Ambulatory Visit (HOSPITAL_COMMUNITY)
Admission: RE | Admit: 2013-11-12 | Discharge: 2013-11-12 | Disposition: A | Discharge status: Home / Self Care | Payer: Medicare Other | Source: Ambulatory Visit | Attending: Urology | Admitting: Urology

## 2013-11-12 DIAGNOSIS — K838 Other specified diseases of biliary tract: Secondary | ICD-10-CM | POA: Insufficient documentation

## 2013-11-12 DIAGNOSIS — D4959 Neoplasm of unspecified behavior of other genitourinary organ: Secondary | ICD-10-CM

## 2013-11-12 DIAGNOSIS — N289 Disorder of kidney and ureter, unspecified: Secondary | ICD-10-CM | POA: Insufficient documentation

## 2013-11-12 DIAGNOSIS — J90 Pleural effusion, not elsewhere classified: Secondary | ICD-10-CM | POA: Insufficient documentation

## 2013-11-12 DIAGNOSIS — K7689 Other specified diseases of liver: Secondary | ICD-10-CM | POA: Insufficient documentation

## 2013-11-12 DIAGNOSIS — N281 Cyst of kidney, acquired: Secondary | ICD-10-CM | POA: Insufficient documentation

## 2013-11-12 MED ORDER — GADOBENATE DIMEGLUMINE 529 MG/ML IV SOLN
16.0000 mL | Freq: Once | INTRAVENOUS | Status: AC | PRN
  Administered 2013-11-12: 16 mL via INTRAVENOUS

## 2013-11-16 ENCOUNTER — Encounter: Payer: Self-pay | Admitting: Emergency Medicine

## 2013-11-16 ENCOUNTER — Ambulatory Visit (INDEPENDENT_AMBULATORY_CARE_PROVIDER_SITE_OTHER): Payer: Medicare Other | Admitting: Emergency Medicine

## 2013-11-16 ENCOUNTER — Telehealth: Payer: Self-pay | Admitting: Emergency Medicine

## 2013-11-16 ENCOUNTER — Encounter (INDEPENDENT_AMBULATORY_CARE_PROVIDER_SITE_OTHER): Payer: Self-pay

## 2013-11-16 VITALS — BP 130/84 | HR 57 | Ht 71.0 in | Wt 185.0 lb

## 2013-11-16 DIAGNOSIS — J90 Pleural effusion, not elsewhere classified: Secondary | ICD-10-CM

## 2013-11-16 NOTE — Patient Instructions (Signed)
We will arrange for a L thoracentesis under IR guidance this week Follow with Dr Lamonte Sakai in 3-4 weeks with a CXR

## 2013-11-16 NOTE — Assessment & Plan Note (Signed)
Slightly larger than her PET scan in 09/2013. She should benefit from a L thora, although I am a bit concerned that she is at risk for a L trapped lung. We will see how well it re-inflates.  - follow in 1 month w a CXR

## 2013-11-16 NOTE — Telephone Encounter (Signed)
Pt called back. She scheduled an appt to come in to see RB this afternoon. She reports her MRI showed the leakage from her lung tumor in December was back. Daughter is aware of this as well. Nothing further needed

## 2013-11-16 NOTE — Progress Notes (Signed)
Subjective:    Patient ID: Jodi Frazier, female    DOB: Aug 26, 1930, 80 y.o.   MRN: [id number]  HPI 80 yo woman, former smoker (40 pk-yrs), HTN, CAD, adenoCA LLL s/p XRT 3-5/'14, COPD, rhinitis, GERD. She is on Advair + albuterol prn. She has had some increased cough and dyspnea since the end of XRT. Her CT scan chest done 02/22/13 shows shrinkage of LLL mass but some associated GGI in the L lung. She was just started on a pred taper by Dr Lanell Persons for this.   ROV 04/08/13 -- hx adenoCA LLL, COPD. She has undergone XRT to LLL lesion, was seen last time with some associated GGI suspicious for radiation pneumonitis. We treated with prednisone, she never tapered to 40mg , has been on 60mg  qd. She is coughing more since last time. Taking advair qd, albuterol prn (2x a week).   ROV 05/04/13 -- hx adenoCA LLL, COPD. She has undergone XRT to LLL lesion, has been rx with pred for suspected radiation pneumonitis / GGI.  At last visit we tapered prednisone to off. We also stopped Advair and start of Breo. We also started protonix and added an allergy regimen for her cough. She has been admitted in the interim for PTX, received more prednisone and is currently tapering. CXR today shows some clearing. She believes that [name] is helping her. She still has nasal gtt.   ROV 06/10/13 --  COPD, hx adenoCA LLL s/p XRT and treated for radiation pneumonitis in 10-05/2013. Hx PTX.Marland Kitchen Also with cough > protonix. ? Loratadine and chlorpheniramine > stopped these. She c/o nausea today, ? Related to increasing the protonic to qd, ? Breo. Her cough appears to better controlled.   Acute OV 11/16/13 -- hx adenoCA LLL s/p XRT and treated for radiation pneumonitis in 10-05/2013. Hx PTX. Also with COPD and allergic rhinitis.  She c/o of dizziness and more dyspnea for the last 7-10 days. She may have some more LE swelling. She just finished 3 XRT to the RUL in March. Has not had and does not want chemo.     Review of Systems   Constitutional: Negative for fever and unexpected weight change.  HENT: Positive for rhinorrhea. Negative for congestion, dental problem, ear pain, nosebleeds, postnasal drip, sinus pressure, sneezing, sore throat and trouble swallowing.   Eyes: Negative for redness and itching.  Respiratory: Positive for cough and shortness of breath. Negative for chest tightness and wheezing.   Cardiovascular: Negative for palpitations and leg swelling.  Gastrointestinal: Negative for nausea and vomiting.  Genitourinary: Negative for dysuria.  Musculoskeletal: Negative for joint swelling.  Skin: Negative for rash.  Neurological: Positive for light-headedness. Negative for headaches.  Hematological: Does not bruise/bleed easily.  Psychiatric/Behavioral: Negative for dysphoric mood. The patient is not nervous/anxious.      Filed Vitals:   11/16/13 1630  BP: 130/84  Pulse: 57  Height: 5\' 11"  (1.803 m)  Weight: 185 lb (83.915 kg)  SpO2: 96%    Gen: Pleasant, well-nourished, in no distress,  normal affect  ENT: No lesions,  mouth clear,  oropharynx clear, no postnasal drip  Neck: No JVD, no TMG, no carotid bruits  Lungs: No use of accessory muscles, no dullness to percussion, clear without rales or rhonchi  Cardiovascular: RRR, heart sounds normal, no murmur or gallops, no peripheral edema  Musculoskeletal: No deformities, no cyanosis or clubbing  Neuro: alert, non focal  Skin: Warm, no lesions or rashes     Objective:   Physical Exam Filed  Vitals:   11/16/13 1630  BP: 130/84  Pulse: 57  Height: 5\' 11"  (1.803 m)  Weight: 185 lb (83.915 kg)  SpO2: 96%    06/01/13 CT chest -- COMPARISON: CT chest 04/24/2013, 02/22/2013  FINDINGS:  CT CHEST FINDINGS  There is interval resolution of the left pneumothorax seen on prior.  Nodule within the left lower lobe measures 16 x 19 mm compared to 15  x 18 mm on prior for no significant change. Peripheral linear  nodularity in the lingula is  similar. There is small left effusion  which is unchanged.  In the right upper lobe, irregular nodule measures 11 mm compared to  8 mm on prior (image 17). There is biapical pleural parenchymal  thickening posteriorly not changed from prior.  Small paratracheal lymph nodes are unchanged and less than 10 mm. No  supraclavicular lymphadenopathy. No axillary adenopathy.  CT ABDOMEN FINDINGS  No focal hepatic lesion. The gallbladder, pancreas, spleen, adrenal  glands, and kidneys are unchanged. There are simple cysts within the  left and right kidney. There is a linear calcification in the right  renal collecting system seen best on the coronal images. There is  indeterminate on prior CT.  Stomach, small bowel and cecum are normal. Diverticulum of the  descending colon.  No retroperitoneal periportal lymphadenopathy.  Within the pelvis, there is a rounded cystic lesion associated with  the right adnexa measuring 3.6 cm is again noted. No aggressive  osseous lesion. Right hip arthroplasty noted. (The pelvis was image  by mistake and not charge to the patient).  IMPRESSION:  1. Left upper lobe nodule is stable compared to prior.  2. Right upper lobe nodule is increased in size and concerning for  bronchogenic carcinoma.  3. Stable linear thickening in the left upper lobe.  4. Linear density in the right renal collecting system likely  represents a calculus. Bilateral nonenhancing renal cysts.  5. No evidence metastasis below diaphragms.  6. Stable right adnexal cystic lesion.      Assessment & Plan:  Pleural effusion, left Slightly larger than her PET scan in 09/2013. She should benefit from a L thora, although I am a bit concerned that she is at risk for a L trapped lung. We will see how well it re-inflates.  - follow in 1 month w a CXR

## 2013-11-16 NOTE — Telephone Encounter (Signed)
LMTCB  If pt is that ill, needs to go to ED

## 2013-11-16 NOTE — Telephone Encounter (Signed)
Wants a call now! Patient can not wait any longer.

## 2013-11-17 ENCOUNTER — Telehealth: Payer: Self-pay | Admitting: Emergency Medicine

## 2013-11-17 NOTE — Telephone Encounter (Signed)
Spoke with pt's daughter and they are aware of date, time and location of US guided thorcentisis Nothing further needed

## 2013-11-18 ENCOUNTER — Ambulatory Visit (HOSPITAL_COMMUNITY)
Admission: RE | Admit: 2013-11-18 | Discharge: 2013-11-18 | Disposition: A | Discharge status: Home / Self Care | Payer: Medicare Other | Source: Ambulatory Visit | Attending: Radiology | Admitting: Radiology

## 2013-11-18 ENCOUNTER — Ambulatory Visit (HOSPITAL_COMMUNITY)
Admission: RE | Admit: 2013-11-18 | Discharge: 2013-11-18 | Disposition: A | Discharge status: Home / Self Care | Payer: Medicare Other | Source: Ambulatory Visit | Attending: Emergency Medicine | Admitting: Emergency Medicine

## 2013-11-18 ENCOUNTER — Ambulatory Visit (HOSPITAL_COMMUNITY)
Admission: RE | Admit: 2013-11-18 | Discharge: 2013-11-18 | Disposition: A | Discharge status: Home / Self Care | Payer: Medicare Other | Source: Ambulatory Visit | Attending: Urology | Admitting: Urology

## 2013-11-18 VITALS — BP 162/63 | HR 68 | Temp 97.4°F | Resp 22

## 2013-11-18 DIAGNOSIS — J90 Pleural effusion, not elsewhere classified: Secondary | ICD-10-CM

## 2013-11-18 DIAGNOSIS — J9383 Other pneumothorax: Secondary | ICD-10-CM | POA: Insufficient documentation

## 2013-11-18 NOTE — Progress Notes (Signed)
Patient ID: Jodi Frazier, female   DOB: [birth year], 80 y.o.   MRN: [id number] Pt has remained stable clinically following left thoracentesis. Her latest CXR shows stable small-moderate left ptx and was reviewed by Dr. Laurence Ferrari. Options for tx were discussed with pt/daughter. Pt opted for conservative management at this time since she feels fine. She will be discharged under the care of her daughter and will notify pulmonology/IR or report to ED if she experiences CP, increased dyspnea.

## 2013-11-18 NOTE — Progress Notes (Signed)
Pt ambulated to BR with assist and upon walking back to room states she is feeling short of breath. Placed pt back in bed and replaced O2./ o2 sat on 2l is 100%. Pt warm and dry slightly pale. Once back in bed and on O2 states her breathing is better now. BP 184/102    p 51    r 24

## 2013-11-18 NOTE — Discharge Instructions (Signed)
For increase chest pain or shortness of breath contact Dr Lamonte Sakai. Rest today

## 2013-11-18 NOTE — Progress Notes (Signed)
Patient ID: Jodi Frazier, female   DOB: [birth year], 80 y.o.   MRN: [id number] Pt s/p left thoracentesis earlier today and f/u CXR reveals left basilar ptx. Pt currently stable with no increased CP or sig dyspnea. Will obtain serial CXR's to monitor and if needed place chest tube. Pt updated on status.

## 2013-11-18 NOTE — Procedures (Signed)
US guided therapeutic left thoracentesis performed yielding 300 cc's yellow fluid. F/u CXR pending. No immediate complications.

## 2013-11-23 ENCOUNTER — Telehealth: Payer: Self-pay | Admitting: Emergency Medicine

## 2013-11-23 ENCOUNTER — Ambulatory Visit (INDEPENDENT_AMBULATORY_CARE_PROVIDER_SITE_OTHER): Payer: Medicare Other | Admitting: Internal Medicine

## 2013-11-23 ENCOUNTER — Telehealth: Payer: Self-pay | Admitting: Internal Medicine

## 2013-11-23 ENCOUNTER — Ambulatory Visit (INDEPENDENT_AMBULATORY_CARE_PROVIDER_SITE_OTHER)
Admission: RE | Admit: 2013-11-23 | Discharge: 2013-11-23 | Disposition: A | Discharge status: Home / Self Care | Payer: Medicare Other | Source: Ambulatory Visit | Attending: Internal Medicine | Admitting: Internal Medicine

## 2013-11-23 ENCOUNTER — Encounter: Payer: Self-pay | Admitting: Internal Medicine

## 2013-11-23 VITALS — BP 110/64 | HR 59 | Temp 97.9°F

## 2013-11-23 DIAGNOSIS — J90 Pleural effusion, not elsewhere classified: Secondary | ICD-10-CM

## 2013-11-23 NOTE — Progress Notes (Signed)
Subjective:    Patient ID: Jodi Frazier, female    DOB: September 04, 1930, 80 y.o.   MRN: [id number]  HPI 80 yo woman, former smoker (40 pk-yrs), HTN, CAD, adenoCA LLL s/p XRT 3-5/'14, COPD, rhinitis, GERD. She is on Advair + albuterol prn. She has had some increased cough and dyspnea since the end of XRT. Her CT scan chest done 02/22/13 shows shrinkage of LLL mass but some associated GGI in the L lung. She was just started on a pred taper by Dr Lanell Persons for this.   ROV 04/08/13 -- hx adenoCA LLL, COPD. She has undergone XRT to LLL lesion, was seen last time with some associated GGI suspicious for radiation pneumonitis. We treated with prednisone, she never tapered to 40mg , has been on 60mg  qd. She is coughing more since last time. Taking advair qd, albuterol prn (2x a week).   ROV 05/04/13 -- hx adenoCA LLL, COPD. She has undergone XRT to LLL lesion, has been rx with pred for suspected radiation pneumonitis / GGI.  At last visit we tapered prednisone to off. We also stopped Advair and start of Breo. We also started protonix and added an allergy regimen for her cough. She has been admitted in the interim for PTX, received more prednisone and is currently tapering. CXR today shows some clearing. She believes that [name] is helping her. She still has nasal gtt.   ROV 06/10/13 --  COPD, hx adenoCA LLL s/p XRT and treated for radiation pneumonitis in 10-05/2013. Hx PTX.Marland Kitchen Also with cough > protonix. ? Loratadine and chlorpheniramine > stopped these. She c/o nausea today, ? Related to increasing the protonic to qd, ? Breo. Her cough appears to better controlled.   Acute OV 11/16/13 -- hx adenoCA LLL s/p XRT and treated for radiation pneumonitis in 10-05/2013. Hx PTX. Also with COPD and allergic rhinitis.  She c/o of dizziness and more dyspnea for the last 7-10 days. She may have some more LE swelling. She just finished 3 XRT to the RUL in March. Has not had and does not want chemo.   rec L thoracentesis done 11/18/13  with small residual ptx and only 300 cc removed.   11/23/2013 f/u ov/Jodi Frazier re:  Chief Complaint  Patient presents with  . Acute Visit    Pt reports increased SOB x 2 days. She is also having left side rib pain- worse when she takes a deep breath.    has not taken any narcs, nsaids rarely and they correct the pain most of the time  baseliine Walk x 50 ft s 02  But needs saba when does this  This am more sob across the room,  Pain with  Deep breath L midline ant More cough than usual  But dry.   No obvious day to day or daytime variabilty or assoc   chest tightness, subjective wheeze overt sinus or hb symptoms. No unusual exp hx or h/o childhood pna/ asthma or knowledge of premature birth.  Sleeping ok without nocturnal  or early am exacerbation  of respiratory  c/o's or need for noct saba. Also denies any obvious fluctuation of symptoms with weather or environmental changes or other aggravating or alleviating factors except as outlined above   Current Medications, Allergies, Complete Past Medical History, Past Surgical History, Family History, and Social History were reviewed in [employer] record.  ROS  The following are not active complaints unless bolded sore throat, dysphagia, dental problems, itching, sneezing,  nasal congestion or excess/ purulent  secretions, ear ache,   fever, chills, sweats, unintended wt loss, pleuritic or exertional cp, hemoptysis,  orthopnea pnd or leg swelling, presyncope, palpitations, heartburn, abdominal pain, anorexia, nausea, vomiting, diarrhea  or change in bowel or urinary habits, change in stools or urine, dysuria,hematuria,  rash, arthralgias, visual complaints, headache, numbness weakness or ataxia or problems with walking or coordination,  change in mood/affect or memory.              Objective:    Wt Readings from Last 3 Encounters:  11/16/13 185 lb (83.915 kg)  10/15/13 177 lb 8 oz (80.513 kg)  09/24/13 183 lb 4.8 oz  (83.144 kg)     Gen: Pleasant, well-nourished, in no distress,  normal affect, sitting in w/c  ENT: No lesions,  mouth clear,  oropharynx clear, no postnasal drip  Neck: No JVD, no TMG, no carotid bruits  Lungs: No use of accessory muscles, slt decrease bs on L  Cardiovascular: RRR, heart sounds normal, no murmur or gallops, no peripheral edema  Musculoskeletal: No deformities, no cyanosis or clubbing  Neuro: alert, non focal  Skin: Warm, no lesions or rashes     cxr 11/23/13 No change in small left pneumothorax. No change in opacity overlying  the left hilum.   Assessment & Plan:

## 2013-11-23 NOTE — Progress Notes (Signed)
Quick Note:  LMTCB ______ 

## 2013-11-23 NOTE — Telephone Encounter (Signed)
Pt been scheduled to see MW today at 11:45am.

## 2013-11-23 NOTE — Progress Notes (Signed)
Quick Note:  Spoke with pt and notified of results per Dr. Wert. Pt verbalized understanding and denied any questions.  ______ 

## 2013-11-23 NOTE — Telephone Encounter (Signed)
I spoke with the pt and notified of cxr results  Nothing further needed

## 2013-11-23 NOTE — Patient Instructions (Addendum)
For pain take aleve first then if needed the  5 mg hydrocodone up to 1-  2 every 4 hours  Please remember to go to the  x-ray department downstairs for your tests - we will call you with the results when they are available.

## 2013-11-24 NOTE — Assessment & Plan Note (Addendum)
S/p thoracentesis 11/18/13 with small residual L PTx   and only 300 cc removed with some improvement transiently now worse pain than sob > rx with nsaids and if not effective add opioids  F/u planned with Dr Lamonte Sakai for "the big picture"

## 2013-12-14 ENCOUNTER — Encounter: Payer: Self-pay | Admitting: Emergency Medicine

## 2013-12-14 ENCOUNTER — Ambulatory Visit (INDEPENDENT_AMBULATORY_CARE_PROVIDER_SITE_OTHER): Payer: Medicare Other | Admitting: Emergency Medicine

## 2013-12-14 VITALS — BP 110/60 | HR 76 | Ht 71.0 in | Wt 185.0 lb

## 2013-12-14 DIAGNOSIS — C3430 Malignant neoplasm of lower lobe, unspecified bronchus or lung: Secondary | ICD-10-CM

## 2013-12-14 DIAGNOSIS — J449 Chronic obstructive pulmonary disease, unspecified: Secondary | ICD-10-CM

## 2013-12-14 DIAGNOSIS — J910 Malignant pleural effusion: Secondary | ICD-10-CM

## 2013-12-14 NOTE — Progress Notes (Signed)
Subjective:    Patient ID: Jodi Frazier, female    DOB: 09-26-1930, 80 y.o.   MRN: [id number]  HPI 80 yo woman, former smoker (40 pk-yrs), HTN, CAD, adenoCA LLL s/p XRT 3-5/'14, COPD, rhinitis, GERD. She is on Advair + albuterol prn. She has had some increased cough and dyspnea since the end of XRT. Her CT scan chest done 02/22/13 shows shrinkage of LLL mass but some associated GGI in the L lung. She was just started on a pred taper by Dr Lanell Persons for this.   ROV 04/08/13 -- hx adenoCA LLL, COPD. She has undergone XRT to LLL lesion, was seen last time with some associated GGI suspicious for radiation pneumonitis. We treated with prednisone, she never tapered to 40mg , has been on 60mg  qd. She is coughing more since last time. Taking advair qd, albuterol prn (2x a week).   ROV 05/04/13 -- hx adenoCA LLL, COPD. She has undergone XRT to LLL lesion, has been rx with pred for suspected radiation pneumonitis / GGI.  At last visit we tapered prednisone to off. We also stopped Advair and start of Breo. We also started protonix and added an allergy regimen for her cough. She has been admitted in the interim for PTX, received more prednisone and is currently tapering. CXR today shows some clearing. She believes that [name] is helping her. She still has nasal gtt.   ROV 06/10/13 --  COPD, hx adenoCA LLL s/p XRT and treated for radiation pneumonitis in 10-05/2013. Hx PTX.Marland Kitchen Also with cough > protonix. ? Loratadine and chlorpheniramine > stopped these. She c/o nausea today, ? Related to increasing the protonic to qd, ? Breo. Her cough appears to better controlled.   Acute OV 11/16/13 -- hx adenoCA LLL s/p XRT and treated for radiation pneumonitis in 10-05/2013. Hx PTX. Also with COPD and allergic rhinitis.  She c/o of dizziness and more dyspnea for the last 7-10 days. She may have some more LE swelling. She just finished 3 XRT to the RUL in March. Has not had and does not want chemo.    ROV 12/14/13 -- hx adenoCA LLL  s/p XRT and treated for radiation pneumonitis in 10-05/2013, followed with Dr Melvyn Novas 5/26 for CP after thora, lung didn't come up completely. The thora didn't change her breathing much at all.    Review of Systems  Constitutional: Negative for fever and unexpected weight change.  HENT: Positive for rhinorrhea. Negative for congestion, dental problem, ear pain, nosebleeds, postnasal drip, sinus pressure, sneezing, sore throat and trouble swallowing.   Eyes: Negative for redness and itching.  Respiratory: Positive for cough and shortness of breath. Negative for chest tightness and wheezing.   Cardiovascular: Negative for palpitations and leg swelling.  Gastrointestinal: Negative for nausea and vomiting.  Genitourinary: Negative for dysuria.  Musculoskeletal: Negative for joint swelling.  Skin: Negative for rash.  Neurological: Positive for light-headedness. Negative for headaches.  Hematological: Does not bruise/bleed easily.  Psychiatric/Behavioral: Negative for dysphoric mood. The patient is not nervous/anxious.      Filed Vitals:   12/14/13 1126  BP: 110/60  Pulse: 76  Height: 5\' 11"  (1.803 m)  Weight: 185 lb (83.915 kg)  SpO2: 97%    Gen: Pleasant, well-nourished, in no distress,  normal affect  ENT: No lesions,  mouth clear,  oropharynx clear, no postnasal drip  Neck: No JVD, no TMG, no carotid bruits  Lungs: No use of accessory muscles, no dullness to percussion, clear without rales or rhonchi  Cardiovascular: RRR,  heart sounds normal, no murmur or gallops, no peripheral edema  Musculoskeletal: No deformities, no cyanosis or clubbing  Neuro: alert, non focal  Skin: Warm, no lesions or rashes     Objective:   Physical Exam Filed Vitals:   12/14/13 1126  BP: 110/60  Pulse: 76  Height: 5\' 11"  (1.803 m)  Weight: 185 lb (83.915 kg)  SpO2: 97%    06/01/13 CT chest -- COMPARISON: CT chest 04/24/2013, 02/22/2013  FINDINGS:  CT CHEST FINDINGS  There is interval  resolution of the left pneumothorax seen on prior.  Nodule within the left lower lobe measures 16 x 19 mm compared to 15  x 18 mm on prior for no significant change. Peripheral linear  nodularity in the lingula is similar. There is small left effusion  which is unchanged.  In the right upper lobe, irregular nodule measures 11 mm compared to  8 mm on prior (image 17). There is biapical pleural parenchymal  thickening posteriorly not changed from prior.  Small paratracheal lymph nodes are unchanged and less than 10 mm. No  supraclavicular lymphadenopathy. No axillary adenopathy.  CT ABDOMEN FINDINGS  No focal hepatic lesion. The gallbladder, pancreas, spleen, adrenal  glands, and kidneys are unchanged. There are simple cysts within the  left and right kidney. There is a linear calcification in the right  renal collecting system seen best on the coronal images. There is  indeterminate on prior CT.  Stomach, small bowel and cecum are normal. Diverticulum of the  descending colon.  No retroperitoneal periportal lymphadenopathy.  Within the pelvis, there is a rounded cystic lesion associated with  the right adnexa measuring 3.6 cm is again noted. No aggressive  osseous lesion. Right hip arthroplasty noted. (The pelvis was image  by mistake and not charge to the patient).  IMPRESSION:  1. Left upper lobe nodule is stable compared to prior.  2. Right upper lobe nodule is increased in size and concerning for  bronchogenic carcinoma.  3. Stable linear thickening in the left upper lobe.  4. Linear density in the right renal collecting system likely  represents a calculus. Bilateral nonenhancing renal cysts.  5. No evidence metastasis below diaphragms.  6. Stable right adnexal cystic lesion.      Assessment & Plan:  Malignant pleural effusion No real improvement with thora - appears to have a component of trapped lung. Would not repeat a thora unless there was a significnat increase in fluid  or increase in atx.   Malignant neoplasm of lower lobe, bronchus, or lung For a repeat CT chest with Dr Lanell Persons in July.   COPD (chronic obstructive pulmonary disease) She is tolerating breo, uses albuterol rarely, about once a week.

## 2013-12-14 NOTE — Assessment & Plan Note (Signed)
For a repeat CT chest with Dr Lanell Persons in July.

## 2013-12-14 NOTE — Patient Instructions (Signed)
Please continue breo every day Use albuterol 2 puffs when needed Continue flonase  We will look for your CT scan in July Follow with Dr Lanell Persons as planned.  Follow with Dr Lamonte Sakai in 4 months or sooner if you have any problems.

## 2013-12-14 NOTE — Assessment & Plan Note (Signed)
She is tolerating breo, uses albuterol rarely, about once a week.

## 2013-12-14 NOTE — Assessment & Plan Note (Signed)
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 Would not repeat a thora unless there was a significnat increase in fluid or increase in atx.

## 2013-12-28 ENCOUNTER — Other Ambulatory Visit: Payer: Self-pay | Admitting: Physician Assistant

## 2013-12-29 NOTE — Telephone Encounter (Signed)
Medication Detail      Disp Refills Start End     cyanocobalamin (,VITAMIN B-12,) 1000 MCG/ML injection 10 mL 0 09/08/2013     Sig - Route: Inject 1 mL (1,000 mcg total) into the muscle every 30 (thirty) days. - Intramuscular    E-Prescribing Status: Receipt confirmed by pharmacy (09/08/2013 8:10 AM EDT)    [place] [place] [facility] [place]    Rx request Denied; Last Rx to Pharmacy 03.11.15, #10 mL-Pt uses 1 mL per 30 days--Too Soon for request/SLS

## 2014-01-03 ENCOUNTER — Telehealth: Payer: Self-pay | Admitting: Physician Assistant

## 2014-01-03 NOTE — Telephone Encounter (Signed)
Requesting refill on cyanocobalamin (,VITAMIN B-12,) 1000 MCG/ML injection, please call into [facility]

## 2014-01-04 MED ORDER — CYANOCOBALAMIN 1000 MCG/ML IJ SOLN: 1000.0000 ug | INTRAMUSCULAR | Status: DC

## 2014-01-04 NOTE — Telephone Encounter (Signed)
Please advise refill? Last RX was done on 09-08-13 quantity 10 ml with 0 refills

## 2014-01-06 ENCOUNTER — Ambulatory Visit
Admission: RE | Admit: 2014-01-06 | Discharge: 2014-01-06 | Disposition: A | Discharge status: Home / Self Care | Payer: Medicare Other | Source: Ambulatory Visit | Attending: Radiation Oncology | Admitting: Radiation Oncology

## 2014-01-06 ENCOUNTER — Encounter (HOSPITAL_COMMUNITY): Payer: Self-pay

## 2014-01-06 ENCOUNTER — Ambulatory Visit (HOSPITAL_COMMUNITY)
Admission: RE | Admit: 2014-01-06 | Discharge: 2014-01-06 | Disposition: A | Discharge status: Home / Self Care | Payer: Medicare Other | Source: Ambulatory Visit | Attending: Radiation Oncology | Admitting: Radiation Oncology

## 2014-01-06 DIAGNOSIS — C3490 Malignant neoplasm of unspecified part of unspecified bronchus or lung: Secondary | ICD-10-CM | POA: Insufficient documentation

## 2014-01-06 DIAGNOSIS — T66XXXA Radiation sickness, unspecified, initial encounter: Secondary | ICD-10-CM | POA: Insufficient documentation

## 2014-01-06 DIAGNOSIS — Y842 Radiological procedure and radiotherapy as the cause of abnormal reaction of the patient, or of later complication, without mention of misadventure at the time of the procedure: Secondary | ICD-10-CM | POA: Insufficient documentation

## 2014-01-06 DIAGNOSIS — C3410 Malignant neoplasm of upper lobe, unspecified bronchus or lung: Secondary | ICD-10-CM

## 2014-01-06 DIAGNOSIS — J90 Pleural effusion, not elsewhere classified: Secondary | ICD-10-CM | POA: Insufficient documentation

## 2014-01-06 DIAGNOSIS — M810 Age-related osteoporosis without current pathological fracture: Secondary | ICD-10-CM | POA: Insufficient documentation

## 2014-01-06 DIAGNOSIS — E278 Other specified disorders of adrenal gland: Secondary | ICD-10-CM | POA: Insufficient documentation

## 2014-01-06 DIAGNOSIS — K7689 Other specified diseases of liver: Secondary | ICD-10-CM | POA: Insufficient documentation

## 2014-01-06 LAB — BUN AND CREATININE (CC13)
BUN: 11.3 mg/dL (ref 7.0–26.0)
CREATININE: 1 mg/dL (ref 0.6–1.1)

## 2014-01-06 MED ORDER — IOHEXOL 300 MG/ML  SOLN
80.0000 mL | Freq: Once | INTRAMUSCULAR | Status: AC | PRN
  Administered 2014-01-06: 80 mL via INTRAVENOUS

## 2014-01-07 ENCOUNTER — Encounter: Payer: Self-pay | Admitting: Radiation Oncology

## 2014-01-07 ENCOUNTER — Ambulatory Visit
Admission: RE | Admit: 2014-01-07 | Discharge: 2014-01-07 | Disposition: A | Discharge status: Home / Self Care | Payer: Medicare Other | Source: Ambulatory Visit | Attending: Radiation Oncology | Admitting: Radiation Oncology

## 2014-01-07 VITALS — BP 132/58 | HR 58 | Temp 97.8°F | Resp 14 | Wt 180.4 lb

## 2014-01-07 DIAGNOSIS — C3430 Malignant neoplasm of lower lobe, unspecified bronchus or lung: Secondary | ICD-10-CM

## 2014-01-07 NOTE — Progress Notes (Signed)
She rates her pain as a 1 on a scale of 0-10. Pt complains of having "cradle cap", Poor Appetite and Vertigo. Wheezing, Shortness of Breath -Walking and Stairs and Coughing -Dry, non productive. Pt is on room air. Going to a chiropractor to treat sinus issues.

## 2014-01-07 NOTE — Progress Notes (Signed)
Radiation Oncology         ([phone]) [phone] ________________________________  Name: Jodi Frazier MRN: [id number]  Date: 01/07/2014  DOB: 1930-12-02  Follow-Up Visit Note  outpatient  CC: Leeanne Rio, PA-C  Gaye Pollack, MD  Diagnosis and Prior Radiotherapy: DIAGNOSIS: Left lower lung poorly differentiated adenocarcinoma, T2aN1M0 STAGE IIA, with new right upper lung nodule  TREATMENT DATES: 08-20-13 to 08-23-13  SITE/DOSE: right upper lung nodule / 54 Gy in 3 fractions  ALSO- s/p external radiation to 70 Gy through 11/20/12 (she opted NOT to receive chemotherapy)   Narrative:  The patient returns today for routine follow-up. She reports continued vertigo. Wheezing, Shortness of Breath with exertion, and Coughing -Dry, non productive. Pt is on room air.  She lives independently, and is doing relatively well. Going to a chiropractor to treat "sinus issues" and still on meclizine.  Since I last saw her she did have a pleural effusion that was addressed with thoracentesis and subsequent pneumothorax. The fluid from the pleural effusion was not sent for cytology. I suspect this may be because the patient has adamantly refused considering chemotherapy for any reason. She continues to be followed closely by pulmonology including Dr. Lamonte Sakai. She had a recent CT scan, performed of the chest on 01/06/2014. This shows no obvious signs of recurrent or progressive disease. The right upper lobe lung lesion that was treated with a spirits she is slightly smaller with surrounding radiation changes. She is remote radiation changes in the left lung with a chronic left pleural effusion and pseudotumor of fluid in the fissure. The mediastinal lymph nodes are stable and borderline in size. There are no new pulmonary lesions. I have looked at these images independently.                                ALLERGIES:  is allergic to codeine; augmentin; and penicillins.  Meds: Current Outpatient Prescriptions    Medication Sig Dispense Refill  . acetaminophen (TYLENOL) 325 MG tablet Take 650 mg by mouth every 6 (six) hours as needed (pain).       Marland Kitchen albuterol (PROVENTIL HFA;VENTOLIN HFA) 108 (90 BASE) MCG/ACT inhaler Inhale 2 puffs into the lungs every 6 (six) hours as needed for wheezing.  1 Inhaler  3  . ALPRAZolam (XANAX) 0.25 MG tablet Take 1 tablet (0.25 mg total) by mouth 2 (two) times daily as needed for anxiety.  30 tablet  0  . amitriptyline (ELAVIL) 25 MG tablet Take 1 tablet (25 mg total) by mouth at bedtime.  90 tablet  0  . amLODipine (NORVASC) 2.5 MG tablet Take 1 tablet (2.5 mg total) by mouth daily.  90 tablet  0  . aspirin EC 81 MG tablet Take 81 mg by mouth daily.      Marland Kitchen BIOTIN PO Take 1 tablet by mouth daily.       . carvedilol (COREG) 6.25 MG tablet Take 1 tablet (6.25 mg total) by mouth 2 (two) times daily with a meal.  180 tablet  0  . cloNIDine (CATAPRES) 0.1 MG tablet Take 0.1 mg by mouth 2 (two) times daily.       . cyanocobalamin (,VITAMIN B-12,) 1000 MCG/ML injection Inject 1 mL (1,000 mcg total) into the muscle every 30 (thirty) days.  10 mL  0  . cycloSPORINE (RESTASIS) 0.05 % ophthalmic emulsion Place 1 drop into both eyes 2 (two) times daily.  0.4 mL  0  . donepezil (ARICEPT) 10 MG tablet Take 1.5 tablets (15 mg total) by mouth at bedtime. Takes 1 and 1/2  135 tablet  0  . escitalopram (LEXAPRO) 10 MG tablet Take 1 tablet (10 mg total) by mouth daily.  90 tablet  0  . Fluticasone Furoate-Vilanterol (BREO ELLIPTA) 100-25 MCG/INH AEPB Inhale 1 puff into the lungs daily.  3 each  0  . HYDROcodone-acetaminophen (NORCO/VICODIN) 5-325 MG per tablet Take 1-2 tablets by mouth every 6 (six) hours as needed for moderate pain.  30 tablet  0  . levothyroxine (SYNTHROID) 125 MCG tablet Take 1 tablet (125 mcg total) by mouth daily before breakfast.  90 tablet  0  . meclizine (ANTIVERT) 50 MG tablet Take 0.5 tablets (25 mg total) by mouth 3 (three) times daily.  135 tablet  0  . mesalamine  (LIALDA) 1.2 G EC tablet Take 1.2 g by mouth 2 (two) times daily.       . midodrine (PROAMATINE) 2.5 MG tablet Take 1 tablet (2.5 mg total) by mouth 2 (two) times daily with a meal.  180 tablet  0  . nitroGLYCERIN (NITROSTAT) 0.4 MG SL tablet Place 0.4 mg under the tongue every 5 (five) minutes as needed for chest pain.       . pantoprazole (PROTONIX) 40 MG tablet TAKE 1 TABLET (40 MG TOTAL) BY MOUTH EVERY MORNING.  30 tablet  2  . Probiotic Product (PROBIOTIC DAILY PO) Take by mouth daily.       No current facility-administered medications for this encounter.    Physical Findings: The patient is in no acute distress. Patient is alert and oriented.  weight is 180 lb 6.4 oz (81.829 kg). Her oral temperature is 97.8 F (36.6 C). Her blood pressure is 132/58 and her pulse is 58. Her respiration is 14 and oxygen saturation is 98%. .    She is able to ambulate independently. She is able to get on the examination table independently. Neck demonstrates no cervical or supraclavicular lymphadenopathy. Heart is regular in rate and rhythm. Chest is grossly clear to auscultation bilaterally.  Lab Findings: Lab Results  Component Value Date   WBC 8.0 10/02/2013   HGB 11.9* 10/02/2013   HCT 36.6 10/02/2013   MCV 83.2 10/02/2013   PLT 301 10/02/2013    Radiographic Findings: Ct Chest W Contrast  01/06/2014   CLINICAL DATA:  Lung cancer.  EXAM: CT CHEST WITH CONTRAST  TECHNIQUE: Multidetector CT imaging of the chest was performed during intravenous contrast administration.  CONTRAST:  20mL OMNIPAQUE IOHEXOL 300 MG/ML  SOLN  COMPARISON:  PET-CT 10/12/2013  FINDINGS: The chest wall is unremarkable. No breast masses, supraclavicular or axillary lymphadenopathy. The bony thorax is intact. No destructive bone lesions or spinal canal compromise. Moderate osteoporosis.  The heart is normal in size. No pericardial effusion. No mediastinal or hilar mass. Stable mediastinal lymph nodes. The aorta is stable in caliber.  Moderate to advanced atherosclerotic calcifications but no dissection. Three-vessel coronary artery calcifications are noted. The esophagus is grossly normal.  Examination of the lung parenchyma demonstrates a slightly smaller nodule in the right long on image number 20 it measures 9.5 x 7.5 mm and previously measured 9.5 x 9.0 mm. There is moderate surrounding radiation changes. Stable remote radiation changes involving the left paramediastinal lung with a persistent left effusion and loculated fluid in the fissure. This measures 17 x 15.5 mm on image number 28 previously measured 17.5 x 15.5 mm. No new pulmonary lesions.  Stable apical scarring. Stable right middle lobe chronic inflammatory changes or MAC.  The upper abdomen is unremarkable and stable. There is diffuse fatty infiltration of the liver. Small bilateral adrenal gland nodules are again demonstrated.  IMPRESSION: 1. Slightly smaller right upper lobe lung lesion with surrounding radiation changes. 2. Remote radiation changes involving the left lung along with a chronic left pleural effusion and pseudotumor of fluid in the fissure. 3. Stable borderline mediastinal lymph nodes. 4. No new pulmonary lesions.   Electronically Signed   By: Kalman Jewels M.D.   On: 01/06/2014 15:34    Impression/Plan:  Doing relatively well with no obvious evidence of recurrent or progressive disease. I will defer to pulmonary medicine regarding the management of her nonneoplastic lung issues. I will or Dr. Lamonte Sakai through the electronic record of her CT scan results. I will see her back in 3 months with repeat CT imaging of her chest at that time. The patient and her daughter would prefer to have the imaging at [facility] on the same day as her followup with me so we will facilitate that. They know to call if they have any issues in the interim.  I spent 25 minutes face to face with the patient and more than 50% of that time was spent in counseling and/or coordination of  care. _____________________________________   Eppie Gibson, MD

## 2014-01-10 ENCOUNTER — Encounter: Payer: Self-pay | Admitting: Radiation Oncology

## 2014-01-10 ENCOUNTER — Telehealth: Payer: Self-pay | Admitting: *Deleted

## 2014-01-10 NOTE — Telephone Encounter (Signed)
CALLED PATIENT TO INFORM OF LAB, TEST AND FU VISIT ON 04-22-14, SPOKE WITH PATIENT AND SHE IS AWARE OF THESE APPTS.

## 2014-01-28 ENCOUNTER — Other Ambulatory Visit: Payer: Self-pay | Admitting: Physician Assistant

## 2014-01-28 MED ORDER — PANTOPRAZOLE SODIUM 40 MG PO TBEC: DELAYED_RELEASE_TABLET | ORAL | Status: DC

## 2014-01-28 NOTE — Telephone Encounter (Signed)
Rx request to pharmacy/SLS  

## 2014-02-08 ENCOUNTER — Inpatient Hospital Stay (HOSPITAL_COMMUNITY)
Admission: EM | Admit: 2014-02-08 | Discharge: 2014-02-10 | DRG: 065 | Disposition: A | Discharge status: Home Health | Payer: Medicare Other | Attending: Internal Medicine | Admitting: Internal Medicine

## 2014-02-08 ENCOUNTER — Emergency Department (HOSPITAL_COMMUNITY): Payer: Medicare Other

## 2014-02-08 ENCOUNTER — Encounter (HOSPITAL_COMMUNITY): Payer: Self-pay | Admitting: Emergency Medicine

## 2014-02-08 ENCOUNTER — Telehealth: Payer: Self-pay | Admitting: Physician Assistant

## 2014-02-08 DIAGNOSIS — J42 Unspecified chronic bronchitis: Secondary | ICD-10-CM

## 2014-02-08 DIAGNOSIS — K5190 Ulcerative colitis, unspecified, without complications: Secondary | ICD-10-CM | POA: Diagnosis present

## 2014-02-08 DIAGNOSIS — Z87891 Personal history of nicotine dependence: Secondary | ICD-10-CM | POA: Diagnosis not present

## 2014-02-08 DIAGNOSIS — Z7982 Long term (current) use of aspirin: Secondary | ICD-10-CM | POA: Diagnosis not present

## 2014-02-08 DIAGNOSIS — F3289 Other specified depressive episodes: Secondary | ICD-10-CM | POA: Diagnosis present

## 2014-02-08 DIAGNOSIS — Z79899 Other long term (current) drug therapy: Secondary | ICD-10-CM

## 2014-02-08 DIAGNOSIS — E7800 Pure hypercholesterolemia, unspecified: Secondary | ICD-10-CM | POA: Diagnosis present

## 2014-02-08 DIAGNOSIS — K219 Gastro-esophageal reflux disease without esophagitis: Secondary | ICD-10-CM | POA: Diagnosis present

## 2014-02-08 DIAGNOSIS — J449 Chronic obstructive pulmonary disease, unspecified: Secondary | ICD-10-CM | POA: Diagnosis present

## 2014-02-08 DIAGNOSIS — R269 Unspecified abnormalities of gait and mobility: Secondary | ICD-10-CM | POA: Diagnosis present

## 2014-02-08 DIAGNOSIS — E039 Hypothyroidism, unspecified: Secondary | ICD-10-CM | POA: Diagnosis present

## 2014-02-08 DIAGNOSIS — I6350 Cerebral infarction due to unspecified occlusion or stenosis of unspecified cerebral artery: Secondary | ICD-10-CM | POA: Diagnosis present

## 2014-02-08 DIAGNOSIS — E785 Hyperlipidemia, unspecified: Secondary | ICD-10-CM | POA: Diagnosis present

## 2014-02-08 DIAGNOSIS — R209 Unspecified disturbances of skin sensation: Secondary | ICD-10-CM | POA: Diagnosis present

## 2014-02-08 DIAGNOSIS — I639 Cerebral infarction, unspecified: Secondary | ICD-10-CM

## 2014-02-08 DIAGNOSIS — Z8619 Personal history of other infectious and parasitic diseases: Secondary | ICD-10-CM | POA: Diagnosis not present

## 2014-02-08 DIAGNOSIS — I739 Peripheral vascular disease, unspecified: Secondary | ICD-10-CM | POA: Diagnosis present

## 2014-02-08 DIAGNOSIS — I252 Old myocardial infarction: Secondary | ICD-10-CM

## 2014-02-08 DIAGNOSIS — H353290 Exudative age-related macular degeneration, unspecified eye, stage unspecified: Secondary | ICD-10-CM | POA: Diagnosis present

## 2014-02-08 DIAGNOSIS — Z9861 Coronary angioplasty status: Secondary | ICD-10-CM | POA: Diagnosis not present

## 2014-02-08 DIAGNOSIS — N390 Urinary tract infection, site not specified: Secondary | ICD-10-CM | POA: Diagnosis present

## 2014-02-08 DIAGNOSIS — I10 Essential (primary) hypertension: Secondary | ICD-10-CM

## 2014-02-08 DIAGNOSIS — Z87440 Personal history of urinary (tract) infections: Secondary | ICD-10-CM | POA: Diagnosis not present

## 2014-02-08 DIAGNOSIS — J4489 Other specified chronic obstructive pulmonary disease: Secondary | ICD-10-CM | POA: Diagnosis present

## 2014-02-08 DIAGNOSIS — Z85118 Personal history of other malignant neoplasm of bronchus and lung: Secondary | ICD-10-CM

## 2014-02-08 DIAGNOSIS — Z96659 Presence of unspecified artificial knee joint: Secondary | ICD-10-CM | POA: Diagnosis not present

## 2014-02-08 DIAGNOSIS — Z923 Personal history of irradiation: Secondary | ICD-10-CM | POA: Diagnosis not present

## 2014-02-08 DIAGNOSIS — F329 Major depressive disorder, single episode, unspecified: Secondary | ICD-10-CM | POA: Diagnosis present

## 2014-02-08 DIAGNOSIS — F0390 Unspecified dementia without behavioral disturbance: Secondary | ICD-10-CM | POA: Diagnosis present

## 2014-02-08 DIAGNOSIS — I2510 Atherosclerotic heart disease of native coronary artery without angina pectoris: Secondary | ICD-10-CM | POA: Diagnosis present

## 2014-02-08 LAB — CBC WITH DIFFERENTIAL/PLATELET
Basophils Absolute: 0.1 10*3/uL (ref 0.0–0.1)
Basophils Relative: 2 % — ABNORMAL HIGH (ref 0–1)
Eosinophils Absolute: 0.3 10*3/uL (ref 0.0–0.7)
Eosinophils Relative: 5 % (ref 0–5)
HCT: 39 % (ref 36.0–46.0)
Hemoglobin: 12.1 g/dL (ref 12.0–15.0)
Lymphocytes Relative: 19 % (ref 12–46)
Lymphs Abs: 1.3 10*3/uL (ref 0.7–4.0)
MCH: 25.6 pg — ABNORMAL LOW (ref 26.0–34.0)
MCHC: 31 g/dL (ref 30.0–36.0)
MCV: 82.6 fL (ref 78.0–100.0)
Monocytes Absolute: 0.8 10*3/uL (ref 0.1–1.0)
Monocytes Relative: 12 % (ref 3–12)
Neutro Abs: 4.2 10*3/uL (ref 1.7–7.7)
Neutrophils Relative %: 62 % (ref 43–77)
Platelets: 296 10*3/uL (ref 150–400)
RBC: 4.72 MIL/uL (ref 3.87–5.11)
RDW: 15.2 % (ref 11.5–15.5)
WBC: 6.8 10*3/uL (ref 4.0–10.5)

## 2014-02-08 LAB — COMPREHENSIVE METABOLIC PANEL
ALT: 15 U/L (ref 0–35)
AST: 18 U/L (ref 0–37)
Albumin: 3.3 g/dL — ABNORMAL LOW (ref 3.5–5.2)
Alkaline Phosphatase: 156 U/L — ABNORMAL HIGH (ref 39–117)
Anion gap: 11 (ref 5–15)
BUN: 13 mg/dL (ref 6–23)
CO2: 23 mEq/L (ref 19–32)
Calcium: 9.7 mg/dL (ref 8.4–10.5)
Chloride: 106 mEq/L (ref 96–112)
Creatinine, Ser: 0.91 mg/dL (ref 0.50–1.10)
GFR calc Af Amer: 67 mL/min — ABNORMAL LOW (ref >90)
GFR calc non Af Amer: 58 mL/min — ABNORMAL LOW (ref >90)
Glucose, Bld: 103 mg/dL — ABNORMAL HIGH (ref 70–99)
Potassium: 4.7 mEq/L (ref 3.7–5.3)
Sodium: 140 mEq/L (ref 137–147)
Total Bilirubin: <0.2 mg/dL — ABNORMAL LOW (ref 0.3–1.2)
Total Protein: 6.9 g/dL (ref 6.0–8.3)

## 2014-02-08 LAB — TROPONIN I: Troponin I: <0.3 ng/mL (ref <0.30)

## 2014-02-08 LAB — PRO B NATRIURETIC PEPTIDE: Pro B Natriuretic peptide (BNP): 85.4 pg/mL (ref 0–450)

## 2014-02-08 MED ORDER — LORAZEPAM 1 MG PO TABS
0.5000 mg | ORAL_TABLET | Freq: Once | ORAL | Status: AC
  Administered 2014-02-08: 0.5 mg via ORAL
  Filled 2014-02-08: qty 1

## 2014-02-08 NOTE — ED Notes (Signed)
Patient returned from MRI.

## 2014-02-08 NOTE — Progress Notes (Signed)
Called ED nurse for report. Told he would return call soon.

## 2014-02-08 NOTE — ED Provider Notes (Signed)
CSN: [id number]     Arrival date & time 02/08/14  1614 History   First MD Initiated Contact with Patient 02/08/14 1632     Chief Complaint  Patient presents with  . Dizziness    hx of vertigo     (Consider location/radiation/quality/duration/timing/severity/associated sxs/prior Treatment) HPI Pt is an 80yo female with hx of CAD, lung cancer and vertigo presenting to ED with c/o intermitent vertigo x3 days.  Today vertigo started around 0700 this morning, took meclizine around 0900 w/o improvement.  Pt has associated SOB, epigastric and lower chest pain that is aching and dull in nature, associated with nausea but no vomiting.  Pt reports 6-8 weeks ago fluid was removed from her lungs and feels like that may need to be performed again. Denies recent illness or falls. Denies numbness or tingling. Denies headache. Does state symptoms worsen with head movement.   Pt does report hx of macular degeneration which has caused chronic disproportional pupils, states her left pupil is always larger than her right.    Past Medical History  Diagnosis Date  . Hypertension   . Colitis   . Coronary artery disease   . High cholesterol     takes Red Yeast Rice and Fish OIl daily  . Kidney stones   . PONV (postoperative nausea and vomiting)   . MI (myocardial infarction) 2010  . Shortness of breath     with exertion  . Pneumonia     hx of 20+yrs ago  . COPD (chronic obstructive pulmonary disease)     pleurisy or COPD exascerbation > 3yr ago  . Headache(784.0)   . Vertigo     HTN related bc gets up too fast  . Arthritis   . Bruises easily   . History of shingles 60yrs ago    in eye;occasionally gets some residual   . Accessory skin tags     arms/legs  . GERD (gastroesophageal reflux disease)   . Colitis   . Diverticulosis   . Urinary frequency   . Stress incontinence   . History of kidney stones   . Hypothyroidism     takes sYnthroid daily  . Cataract     early stage on right  . Macular  degeneration, wet   . Depression     some but takes Amitryptylline nightly  . Right-sided chest wall pain 08/28/12    PRESENTATION - RIGHT SIDED ANTERIOR CHEST PAIN  . Weight loss 08/28/12    10 LB WEIGHT LOSS OVER 3 MONTHS  . Lung mass 08/12/12    CHEST-XRAY/ PET -LOBULAR MASS LLL - 4.1 X 3.6 X 4.2 CM  . Dementia     mild  . S/P radiation therapy  10/05/2012-11/20/2012    Left Lower Lung and hilum / 70 Gy in 35 fractions  . Hypertension   . At risk for fall due to comorbid condition     5 falls - last Early December  . Vertigo   . Orthostatic hypotension   . S/P radiation therapy 2/13, 2/20, 2/23 (2015)    SBRT to Right Lung  . Adenocarcinoma of lung 09/07/12    needle core bx-LLL-adenocarcinoma   Past Surgical History  Procedure Laterality Date  . Hip arthroscopy  31yr ago    right hip-replacement  . Knee arthroscopy      LT  . Total knee arthroplasty  66yrs ago    left  . Appendectomy  54yrs ago  . Abdominal hysterectomy  42yrs ago   .  Lithotripsy  2011  . Cataract surgery  2012    left  . Eye surgery  81yrs ago  . Bladder surgery  2001    tacked  . Coronary angioplasty with stent placement  2010    2 in rt coronary artery and 1 in another spot  . Colonoscopy    . Esophagogastroduodenoscopy    . Total knee arthroplasty  09/09/2011    Procedure: TOTAL KNEE ARTHROPLASTY;  Surgeon: Rudean Haskell, MD;  Location: [facility];  Service: Orthopedics;  Laterality: Right;  Right Total Knee Arthroplasty  . Bronchial brush biopsy Left 08/17/12    LLL Bronchial Washing / Brushing and Bronchial Biopsy: Negative for malignancy  . Left lower lobe needle core biopsy Left 09/07/12    Poorly Differentiated Adenocarcinoma   . Joint replacement     Family History  Problem Relation Age of Onset  . Anesthesia problems Mother   . Hypotension Neg Hx   . Malignant hyperthermia Neg Hx   . Pseudochol deficiency Neg Hx   . Cancer Maternal Aunt     breast   History  Substance Use Topics  .  Smoking status: Former Smoker -- 1.00 packs/day for 40 years    Types: Cigarettes    Quit date: 08/26/2002  . Smokeless tobacco: Never Used     Comment: quit several yrs ago  . Alcohol Use: Yes     Comment: 2 times week alcohol   OB History   Grav Para Term Preterm Abortions TAB SAB Ect Mult Living                 Review of Systems  Constitutional: Negative for fever and chills.  Respiratory: Positive for cough and shortness of breath.   Cardiovascular: Positive for chest pain. Negative for palpitations and leg swelling.  Gastrointestinal: Positive for nausea. Negative for vomiting, abdominal pain, diarrhea and constipation.  Neurological: Positive for dizziness. Negative for syncope, weakness, light-headedness and headaches.  All other systems reviewed and are negative.     Allergies  Codeine; Augmentin; and Penicillins  Home Medications   Prior to Admission medications   Medication Sig Start Date End Date Taking? Authorizing Provider  acetaminophen (TYLENOL) 325 MG tablet Take 650 mg by mouth every 6 (six) hours as needed (pain).     Historical Provider, MD  albuterol (PROVENTIL HFA;VENTOLIN HFA) 108 (90 BASE) MCG/ACT inhaler Inhale 2 puffs into the lungs every 6 (six) hours as needed for wheezing. 02/08/13   Doree Fudge, MD  ALPRAZolam Duanne Moron) 0.25 MG tablet Take 1 tablet (0.25 mg total) by mouth 2 (two) times daily as needed for anxiety. 07/02/13   Robbie Lis, MD  amitriptyline (ELAVIL) 25 MG tablet Take 1 tablet (25 mg total) by mouth at bedtime. 10/15/13   Brunetta Jeans, PA-C  amLODipine (NORVASC) 2.5 MG tablet Take 1 tablet (2.5 mg total) by mouth daily. 10/15/13   Brunetta Jeans, PA-C  aspirin EC 81 MG tablet Take 81 mg by mouth daily.    Historical Provider, MD  BIOTIN PO Take 1 tablet by mouth daily.     Historical Provider, MD  carvedilol (COREG) 6.25 MG tablet TAKE 1 TABLET BY MOUTH TWICE DAILY WITH A MEAL 01/28/14   Brunetta Jeans, PA-C  cloNIDine  (CATAPRES) 0.1 MG tablet Take 0.1 mg by mouth 2 (two) times daily.     Historical Provider, MD  cyanocobalamin (,VITAMIN B-12,) 1000 MCG/ML injection Inject 1 mL (1,000 mcg total) into the muscle every  30 (thirty) days. 01/04/14   Debbrah Alar, NP  cycloSPORINE (RESTASIS) 0.05 % ophthalmic emulsion Place 1 drop into both eyes 2 (two) times daily. 10/15/13   Brunetta Jeans, PA-C  donepezil (ARICEPT) 10 MG tablet Take 1.5 tablets (15 mg total) by mouth at bedtime. Takes 1 and 1/2 10/15/13   Brunetta Jeans, PA-C  escitalopram (LEXAPRO) 10 MG tablet Take 1 tablet (10 mg total) by mouth daily. 10/15/13   Brunetta Jeans, PA-C  Fluticasone Furoate-Vilanterol (BREO ELLIPTA) 100-25 MCG/INH AEPB Inhale 1 puff into the lungs daily. 10/15/13   Brunetta Jeans, PA-C  HYDROcodone-acetaminophen (NORCO/VICODIN) 5-325 MG per tablet Take 1-2 tablets by mouth every 6 (six) hours as needed for moderate pain. 07/02/13   Robbie Lis, MD  levothyroxine (SYNTHROID) 125 MCG tablet Take 1 tablet (125 mcg total) by mouth daily before breakfast. 10/15/13   Brunetta Jeans, PA-C  meclizine (ANTIVERT) 50 MG tablet Take 0.5 tablets (25 mg total) by mouth 3 (three) times daily. 10/15/13   Brunetta Jeans, PA-C  mesalamine (LIALDA) 1.2 G EC tablet Take 1.2 g by mouth 2 (two) times daily.     Historical Provider, MD  midodrine (PROAMATINE) 2.5 MG tablet Take 1 tablet (2.5 mg total) by mouth 2 (two) times daily with a meal. 10/15/13   Brunetta Jeans, PA-C  nitroGLYCERIN (NITROSTAT) 0.4 MG SL tablet Place 0.4 mg under the tongue every 5 (five) minutes as needed for chest pain.     Historical Provider, MD  pantoprazole (PROTONIX) 40 MG tablet TAKE 1 TABLET (40 MG TOTAL) BY MOUTH EVERY MORNING. 01/28/14   Brunetta Jeans, PA-C  Probiotic Product (PROBIOTIC DAILY PO) Take by mouth daily.    Historical Provider, MD   BP 177/62  Pulse 61  Temp(Src) 97.5 F (36.4 C) (Oral)  Resp 17  Ht 5\' 11"  (1.803 m)  Wt 182 lb (82.555 kg)   BMI 25.40 kg/m2  SpO2 96% Physical Exam  Nursing note and vitals reviewed. Constitutional: She is oriented to person, place, and time. She appears well-developed and well-nourished. No distress.  Pt lying in exam bed, nasal canula in place. NAD  HENT:  Head: Normocephalic and atraumatic.  Eyes: Conjunctivae and EOM are normal. No scleral icterus.  Left pupil: 7mm Right pupil: 46mm  EOM in tact w/o nystagmus   Neck: Normal range of motion. Neck supple.  Cardiovascular: Normal rate, regular rhythm and normal heart sounds.   Pulmonary/Chest: Effort normal and breath sounds normal. No respiratory distress. She has no wheezes. She has no rales. She exhibits no tenderness.  No respiratory distress, able to speak in full sentences w/o difficulty. Lungs: CTAB  Abdominal: Soft. Bowel sounds are normal. She exhibits no distension and no mass. There is no tenderness. There is no rebound and no guarding.  Musculoskeletal: Normal range of motion.  Neurological: She is alert and oriented to person, place, and time. No cranial nerve deficit.  CN II-XII grossly in tact. Alert and oriented to person place and time. 5/5 strength upper and lower extremities bilaterally. Finger to nose coordination: normal with left hand but pt reports new tremor in right hand during exam. Negative rhomberg.  Mildly unsteady gait.    Skin: Skin is warm and dry. She is not diaphoretic.    ED Course  Procedures (including critical care time) Labs Review Labs Reviewed  CBC WITH DIFFERENTIAL - Abnormal; Notable for the following:    MCH 25.6 (*)    Basophils Relative  2 (*)    All other components within normal limits  COMPREHENSIVE METABOLIC PANEL - Abnormal; Notable for the following:    Glucose, Bld 103 (*)    Albumin 3.3 (*)    Alkaline Phosphatase 156 (*)    Total Bilirubin <0.2 (*)    GFR calc non Af Amer 58 (*)    GFR calc Af Amer 67 (*)    All other components within normal limits  PRO B NATRIURETIC PEPTIDE   TROPONIN I    Imaging Review Dg Chest 2 View  02/08/2014   CLINICAL DATA:  Chest pain.  Lung cancer.  Hypertension.  Ex-smoker.  EXAM: CHEST  2 VIEW  COMPARISON:  01/06/2014 CT and plain films of 11/23/2013  FINDINGS: Hyperinflation/COPD. Lateral view degraded by patient arm position. Midline trachea. Normal heart size with atherosclerosis in the transverse aorta. Left-sided pleural thickening, blunting the left costophrenic angle. No pleural fluid. Biapical pleural thickening. Radiation changes within the left suprahilar region with resultant hilar retraction superiorly. Right apical pleural parenchymal opacity is grossly similar and also likely related to radiation change. No new lobar consolidation.  IMPRESSION: Similar appearance of bilateral radiation changes superimposed upon COPD/chronic bronchitis. No convincing evidence of acute superimposed process.   Electronically Signed   By: Abigail Miyamoto M.D.   On: 02/08/2014 18:23   Ct Head Wo Contrast  02/08/2014   CLINICAL DATA:  Dizziness.  Nausea.  Vertigo.  EXAM: CT HEAD WITHOUT CONTRAST  TECHNIQUE: Contiguous axial images were obtained from the base of the skull through the vertex without intravenous contrast.  COMPARISON:  05/12/2013  FINDINGS: There is no evidence of intracranial hemorrhage, brain edema, or other signs of acute infarction. There is no evidence of intracranial mass lesion or mass effect. No abnormal extraaxial fluid collections are identified.  Mild to moderate diffuse cerebral atrophy appears stable. Ventricles are normal in size. No skull fracture or other bone lesion identified.  IMPRESSION: No acute intracranial findings.  Stable cerebral atrophy.   Electronically Signed   By: Earle Gell M.D.   On: 02/08/2014 18:26     EKG Interpretation None      MDM   Final diagnoses:  None    Pt is an 80yo female with hx of vertigo presenting to ED with c/o dizziness not resolved with home medications of meclizine. Pt also  mention concern for fluid buildup on her lungs due to cancer. Cardiopulmonary workup performed: negative.  On neuro exam: pt does have mildly unsteady gait, however pt does report previous right hip injury from a fall that causes her to have a wife gait and prevents her from being unable to walk heel to toe normally. During finger to nose coordination exam, coordination is normal, however, pt reports having new tremor in right hand.   CT head: unremarkable.  Discussed pt with Dr. Doy Mince who recommended MR brain as CT cannot r/o posterior stroke.    Discussed plan with pt, pt is hesitant for MRI as she reports having them in the past and believes this is just her vertigo. States her symptoms have improved while in ED w/o medications.  8:39 PM Pt signed out to Del[facility] Haring at shift change, plan is to f/u with MR brain if pt agrees after discussing with Dr. Doy Mince.  If MRI normal, pt may be discharged home with ENT f/u.      Noland Fordyce, PA-C 02/08/14 2042

## 2014-02-08 NOTE — Consult Note (Signed)
Referring Physician: WIOXBDZ, J    Chief Complaint: Severe vertigo and unstable gait.  HPI: Jodi Frazier is an 80 y.o. female with a history of vertigo, hypertension, hyperlipidemia, COPD, myocardial infarction and adenocarcinoma of the lung, presenting following an episode of severe sudden vertigo at about 10:00 this morning with persistence of symptoms exacerbated by movement. CT scan of the head showed no acute intracranial abnormality. MRI study showed 5 mm acute right cerebellar ischemic infarction. MRA was unremarkable except for atherosclerotic changes. Patient has no previous history of stroke nor TIA. She's been taking aspirin 81 mg per day. NIH stroke score was 1 for numbness involving right upper extremity which is chronic and related to previous neck injury.  LSN: 8 AM on 02/08/2014 tPA Given: No: Minimal deficits, and beyond time window for treatment consideration. MRankin: 1  Past Medical History  Diagnosis Date  . Hypertension   . Colitis   . Coronary artery disease   . High cholesterol     takes Red Yeast Rice and Fish OIl daily  . Kidney stones   . PONV (postoperative nausea and vomiting)   . MI (myocardial infarction) 2010  . Shortness of breath     with exertion  . Pneumonia     hx of 20+yrs ago  . COPD (chronic obstructive pulmonary disease)     pleurisy or COPD exascerbation > 58yr ago  . Headache(784.0)   . Vertigo     HTN related bc gets up too fast  . Arthritis   . Bruises easily   . History of shingles 58yrs ago    in eye;occasionally gets some residual   . Accessory skin tags     arms/legs  . GERD (gastroesophageal reflux disease)   . Colitis   . Diverticulosis   . Urinary frequency   . Stress incontinence   . History of kidney stones   . Hypothyroidism     takes sYnthroid daily  . Cataract     early stage on right  . Macular degeneration, wet   . Depression     some but takes Amitryptylline nightly  . Right-sided chest wall pain 08/28/12     PRESENTATION - RIGHT SIDED ANTERIOR CHEST PAIN  . Weight loss 08/28/12    10 LB WEIGHT LOSS OVER 3 MONTHS  . Lung mass 08/12/12    CHEST-XRAY/ PET -LOBULAR MASS LLL - 4.1 X 3.6 X 4.2 CM  . Dementia     mild  . S/P radiation therapy  10/05/2012-11/20/2012    Left Lower Lung and hilum / 70 Gy in 35 fractions  . Hypertension   . At risk for fall due to comorbid condition     5 falls - last Early December  . Vertigo   . Orthostatic hypotension   . S/P radiation therapy 2/13, 2/20, 2/23 (2015)    SBRT to Right Lung  . Adenocarcinoma of lung 09/07/12    needle core bx-LLL-adenocarcinoma    Family History  Problem Relation Age of Onset  . Anesthesia problems Mother   . Hypotension Neg Hx   . Malignant hyperthermia Neg Hx   . Pseudochol deficiency Neg Hx   . Cancer Maternal Aunt     breast     Medications: I have reviewed the patient's current medications.  ROS: History obtained from the patient  General ROS: negative for - chills, fatigue, fever, night sweats, weight gain or weight loss Psychological ROS: negative for - behavioral disorder, hallucinations, memory difficulties, mood swings  or suicidal ideation Ophthalmic ROS: negative for - blurry vision, double vision, eye pain or loss of vision ENT ROS: negative for - epistaxis, nasal discharge, oral lesions, sore throat, tinnitus or vertigo Allergy and Immunology ROS: negative for - hives or itchy/watery eyes Hematological and Lymphatic ROS: negative for - bleeding problems, bruising or swollen lymph nodes Endocrine ROS: negative for - galactorrhea, hair pattern changes, polydipsia/polyuria or temperature intolerance Respiratory ROS: negative for - cough, hemoptysis, shortness of breath or wheezing Cardiovascular ROS: negative for - chest pain, dyspnea on exertion, edema or irregular heartbeat Gastrointestinal ROS: negative for - abdominal pain, diarrhea, hematemesis, nausea/vomiting or stool incontinence Genito-Urinary ROS:  negative for - dysuria, hematuria, incontinence or urinary frequency/urgency Musculoskeletal ROS: negative for - joint swelling or muscular weakness Neurological ROS: as noted in HPI Dermatological ROS: negative for rash and skin lesion changes  Physical Examination: Blood pressure 163/58, pulse 53, temperature 97.5 F (36.4 C), temperature source Oral, resp. rate 14, height 5\' 11"  (1.803 m), weight 82.555 kg (182 lb), SpO2 97.00%.  Neurologic Examination: Mental Status: Alert, oriented, thought content appropriate.  Speech fluent without evidence of aphasia. Able to follow commands without difficulty. Cranial Nerves: II-Visual fields were normal. III/IV/VI-left pupil slightly larger than the right and both reactive minimally to light. Extraocular movements were full and conjugate.    V/VII-no facial numbness and no facial weakness. VIII-normal. X-normal speech. Motor: 5/5 bilaterally with normal tone and bulk Sensory: Normal except for numbness involving right forearm and hand. Deep Tendon Reflexes: 1+ and symmetric. Plantars: Mute bilaterally Cerebellar: Normal finger-to-nose testing.  Dg Chest 2 View  02/08/2014   CLINICAL DATA:  Chest pain.  Lung cancer.  Hypertension.  Ex-smoker.  EXAM: CHEST  2 VIEW  COMPARISON:  01/06/2014 CT and plain films of 11/23/2013  FINDINGS: Hyperinflation/COPD. Lateral view degraded by patient arm position. Midline trachea. Normal heart size with atherosclerosis in the transverse aorta. Left-sided pleural thickening, blunting the left costophrenic angle. No pleural fluid. Biapical pleural thickening. Radiation changes within the left suprahilar region with resultant hilar retraction superiorly. Right apical pleural parenchymal opacity is grossly similar and also likely related to radiation change. No new lobar consolidation.  IMPRESSION: Similar appearance of bilateral radiation changes superimposed upon COPD/chronic bronchitis. No convincing evidence of  acute superimposed process.   Electronically Signed   By: Abigail Miyamoto M.D.   On: 02/08/2014 18:23   Ct Head Wo Contrast  02/08/2014   CLINICAL DATA:  Dizziness.  Nausea.  Vertigo.  EXAM: CT HEAD WITHOUT CONTRAST  TECHNIQUE: Contiguous axial images were obtained from the base of the skull through the vertex without intravenous contrast.  COMPARISON:  05/12/2013  FINDINGS: There is no evidence of intracranial hemorrhage, brain edema, or other signs of acute infarction. There is no evidence of intracranial mass lesion or mass effect. No abnormal extraaxial fluid collections are identified.  Mild to moderate diffuse cerebral atrophy appears stable. Ventricles are normal in size. No skull fracture or other bone lesion identified.  IMPRESSION: No acute intracranial findings.  Stable cerebral atrophy.   Electronically Signed   By: Earle Gell M.D.   On: 02/08/2014 18:26   Mr Jodene Nam Head Wo Contrast  02/08/2014   CLINICAL DATA:  Dizziness and abnormal neurologic exam. Rule out stroke. History of lung cancer.  EXAM: MRI HEAD WITHOUT CONTRAST  MRA HEAD WITHOUT CONTRAST  TECHNIQUE: Multiplanar, multiecho pulse sequences of the brain and surrounding structures were obtained without intravenous contrast. Angiographic images of the head were obtained  using MRA technique without contrast.  COMPARISON:  Head CT 02/08/2014 and MRI 06/11/2013. Head MRA 07/13/2007.  FINDINGS: MRI HEAD FINDINGS  There is a 5 mm acute infarct in the right cerebellar hemisphere. There is no intracranial hemorrhage, mass, midline shift, or extra-axial fluid collection. Small foci of T2 hyperintensity in the subcortical and periventricular white matter are unchanged and nonspecific but may reflect minimal chronic small vessel ischemic disease. There is mild to moderate generalized cerebral atrophy.  Prior left cataract extraction is noted. Paranasal sinuses and mastoid air cells are clear. Major intracranial vascular flow voids are preserved.  MRA HEAD  FINDINGS  Images are mildly to moderately degraded by motion. Visualized distal vertebral arteries appear patent, with moderate irregular narrowing of the right vertebral artery, particularly distal to the PICA origin, new from prior. Left vertebral artery is dominant. PICA origins are patent. SCA origins are patent. Basilar artery is patent without stenosis. Irregular narrowing of the proximal PCAs is moderate on the right and mild on the left and appears to have mildly progressed from the prior study on the right side. There is moderate to prominent branch vessel irregularity and attenuation bilaterally.  Internal carotid arteries are patent from skullbase to carotid termini. Mild bilateral carotid siphon irregularity is noted without significant stenosis. The right A1 segment is again seen to be absent, with the right PCA supplied by the left via the anterior communicating artery. No significant proximal MCA stenosis is identified, however there is mild-to-moderate bilateral MCA branch vessel irregularity. No intracranial aneurysm is identified.  IMPRESSION: 1. 5 mm acute right cerebellar infarct. 2. No evidence of major intracranial arterial occlusion. 3. Intracranial atherosclerosis appears to have increased from the prior MRA, although findings may be accentuated by motion artifact on the current examination. There is moderate irregular narrowing of the nondominant right vertebral artery as well as of the right greater than left proximal PCAs.   Electronically Signed   By: Logan Bores   On: 02/08/2014 21:58   Mr Brain Wo Contrast  02/08/2014   CLINICAL DATA:  Dizziness and abnormal neurologic exam. Rule out stroke. History of lung cancer.  EXAM: MRI HEAD WITHOUT CONTRAST  MRA HEAD WITHOUT CONTRAST  TECHNIQUE: Multiplanar, multiecho pulse sequences of the brain and surrounding structures were obtained without intravenous contrast. Angiographic images of the head were obtained using MRA technique without  contrast.  COMPARISON:  Head CT 02/08/2014 and MRI 06/11/2013. Head MRA 07/13/2007.  FINDINGS: MRI HEAD FINDINGS  There is a 5 mm acute infarct in the right cerebellar hemisphere. There is no intracranial hemorrhage, mass, midline shift, or extra-axial fluid collection. Small foci of T2 hyperintensity in the subcortical and periventricular white matter are unchanged and nonspecific but may reflect minimal chronic small vessel ischemic disease. There is mild to moderate generalized cerebral atrophy.  Prior left cataract extraction is noted. Paranasal sinuses and mastoid air cells are clear. Major intracranial vascular flow voids are preserved.  MRA HEAD FINDINGS  Images are mildly to moderately degraded by motion. Visualized distal vertebral arteries appear patent, with moderate irregular narrowing of the right vertebral artery, particularly distal to the PICA origin, new from prior. Left vertebral artery is dominant. PICA origins are patent. SCA origins are patent. Basilar artery is patent without stenosis. Irregular narrowing of the proximal PCAs is moderate on the right and mild on the left and appears to have mildly progressed from the prior study on the right side. There is moderate to prominent branch vessel irregularity and  attenuation bilaterally.  Internal carotid arteries are patent from skullbase to carotid termini. Mild bilateral carotid siphon irregularity is noted without significant stenosis. The right A1 segment is again seen to be absent, with the right PCA supplied by the left via the anterior communicating artery. No significant proximal MCA stenosis is identified, however there is mild-to-moderate bilateral MCA branch vessel irregularity. No intracranial aneurysm is identified.  IMPRESSION: 1. 5 mm acute right cerebellar infarct. 2. No evidence of major intracranial arterial occlusion. 3. Intracranial atherosclerosis appears to have increased from the prior MRA, although findings may be  accentuated by motion artifact on the current examination. There is moderate irregular narrowing of the nondominant right vertebral artery as well as of the right greater than left proximal PCAs.   Electronically Signed   By: Logan Bores   On: 02/08/2014 21:58    Assessment: 80 y.o. female with multiple risk factors for stroke presenting with acute right small cerebellar ischemic infarction.  Stroke Risk Factors - hyperlipidemia and hypertension  Plan: 1. HgbA1c, fasting lipid panel 2. PT consult 3. Echocardiogram 4. Carotid dopplers 5. Prophylactic therapy-Antiplatelet med: Aspirin  6. continue meclizine 50 mg 3 times a day when necessary vertigo 7. Telemetry monitoring   C.R. Nicole Kindred, MD [facility] [phone]  02/08/2014, 10:42 PM

## 2014-02-08 NOTE — Telephone Encounter (Signed)
Patient Information:  Caller Name: Eustaquio Maize  Phone: [phone]  Patient: Jodi Frazier, Jodi Frazier  Gender: Female  DOB: March 30, 1931  Age: 80 Years  PCP: Raiford Noble "Einar Pheasant"  Office Follow Up:  Does the office need to follow up with this patient?: No  Instructions For The Office: N/A  RN Note:  Patient states that pressure is more intense than the dizziness, no Shortness of breath.  Pressure is on the left side and states that she has recently had to go to the emergency room x2 to have fluid removed off of the left lung.  Once in November and again around June. Patient also has a history of having a heart attack and only presenting symptom at that time was shortness of breath.  Due to recent history and pain in chest area lasting longer than 5 minutes encourage patient to call 911 andhave them take her to the emergency room for evaluation.  Patient agrees to plan.  Symptoms  Reason For Call & Symptoms: Woke up with Dizziness and has a history of Vertigo.  Having pressure to stomach and underneath breast area.  Reviewed Health History In EMR: Yes  Reviewed Medications In EMR: Yes  Reviewed Allergies In EMR: Yes  Reviewed Surgeries / Procedures: Yes  Date of Onset of Symptoms: 02/07/2014  Guideline(s) Used:  Chest Pain  Disposition Per Guideline:   Call EMS 911 Now  Reason For Disposition Reached:   Chest pain lasting longer than 5 minutes and ANY of the following:  Over 78 years old Over 72 years old and at least one cardiac risk factor (i.e., high blood pressure, diabetes, high cholesterol, obesity, smoker or strong family history of heart disease) Pain is crushing, pressure-like, or heavy  Took nitroglycerin and chest pain was not relieved History of heart disease (i.e., angina, heart attack, bypass surgery, angioplasty, CHF)  Advice Given:  N/A  Patient Will Follow Care Advice:  YES

## 2014-02-08 NOTE — ED Notes (Signed)
Patient transported to X-ray 

## 2014-02-08 NOTE — ED Provider Notes (Signed)
Patient presents to the ED with complaints of Vertigo that has been going om for the past 3 days. This morning at 7 am when walking back to the bedroom from using the restroom she became dizzy. She reports that throughout the day she has had a lot of other associated symptoms "coming and going of dizzines",  abnormal stool and to episodes of near vomiting.   She was seen originally by Noland Fordyce, PA-C.  And waiting for MRI. MRI  Called and told me they saw cerebellar infarct, MRA added on.    Results for orders placed during the hospital encounter of 02/08/14  CBC WITH DIFFERENTIAL      Result Value Ref Range   WBC 6.8  4.0 - 10.5 K/uL   RBC 4.72  3.87 - 5.11 MIL/uL   Hemoglobin 12.1  12.0 - 15.0 g/dL   HCT 39.0  36.0 - 46.0 %   MCV 82.6  78.0 - 100.0 fL   MCH 25.6 (*) 26.0 - 34.0 pg   MCHC 31.0  30.0 - 36.0 g/dL   RDW 15.2  11.5 - 15.5 %   Platelets 296  150 - 400 K/uL   Neutrophils Relative % 62  43 - 77 %   Neutro Abs 4.2  1.7 - 7.7 K/uL   Lymphocytes Relative 19  12 - 46 %   Lymphs Abs 1.3  0.7 - 4.0 K/uL   Monocytes Relative 12  3 - 12 %   Monocytes Absolute 0.8  0.1 - 1.0 K/uL   Eosinophils Relative 5  0 - 5 %   Eosinophils Absolute 0.3  0.0 - 0.7 K/uL   Basophils Relative 2 (*) 0 - 1 %   Basophils Absolute 0.1  0.0 - 0.1 K/uL  COMPREHENSIVE METABOLIC PANEL      Result Value Ref Range   Sodium 140  137 - 147 mEq/L   Potassium 4.7  3.7 - 5.3 mEq/L   Chloride 106  96 - 112 mEq/L   CO2 23  19 - 32 mEq/L   Glucose, Bld 103 (*) 70 - 99 mg/dL   BUN 13  6 - 23 mg/dL   Creatinine, Ser 0.91  0.50 - 1.10 mg/dL   Calcium 9.7  8.4 - 10.5 mg/dL   Total Protein 6.9  6.0 - 8.3 g/dL   Albumin 3.3 (*) 3.5 - 5.2 g/dL   AST 18  0 - 37 U/L   ALT 15  0 - 35 U/L   Alkaline Phosphatase 156 (*) 39 - 117 U/L   Total Bilirubin <0.2 (*) 0.3 - 1.2 mg/dL   GFR calc non Af Amer 58 (*) >90 mL/min   GFR calc Af Amer 67 (*) >90 mL/min   Anion gap 11  5 - 15  PRO B NATRIURETIC PEPTIDE       Result Value Ref Range   Pro B Natriuretic peptide (BNP) 85.4  0 - 450 pg/mL  TROPONIN I      Result Value Ref Range   Troponin I <0.30  <0.30 ng/mL   Dg Chest 2 View  02/08/2014   CLINICAL DATA:  Chest pain.  Lung cancer.  Hypertension.  Ex-smoker.  EXAM: CHEST  2 VIEW  COMPARISON:  01/06/2014 CT and plain films of 11/23/2013  FINDINGS: Hyperinflation/COPD. Lateral view degraded by patient arm position. Midline trachea. Normal heart size with atherosclerosis in the transverse aorta. Left-sided pleural thickening, blunting the left costophrenic angle. No pleural fluid. Biapical pleural thickening. Radiation changes within  the left suprahilar region with resultant hilar retraction superiorly. Right apical pleural parenchymal opacity is grossly similar and also likely related to radiation change. No new lobar consolidation.  IMPRESSION: Similar appearance of bilateral radiation changes superimposed upon COPD/chronic bronchitis. No convincing evidence of acute superimposed process.   Electronically Signed   By: Abigail Miyamoto M.D.   On: 02/08/2014 18:23   Ct Head Wo Contrast  02/08/2014   CLINICAL DATA:  Dizziness.  Nausea.  Vertigo.  EXAM: CT HEAD WITHOUT CONTRAST  TECHNIQUE: Contiguous axial images were obtained from the base of the skull through the vertex without intravenous contrast.  COMPARISON:  05/12/2013  FINDINGS: There is no evidence of intracranial hemorrhage, brain edema, or other signs of acute infarction. There is no evidence of intracranial mass lesion or mass effect. No abnormal extraaxial fluid collections are identified.  Mild to moderate diffuse cerebral atrophy appears stable. Ventricles are normal in size. No skull fracture or other bone lesion identified.  IMPRESSION: No acute intracranial findings.  Stable cerebral atrophy.   Electronically Signed   By: Earle Gell M.D.   On: 02/08/2014 18:26   Mr Jodene Nam Head Wo Contrast  02/08/2014   CLINICAL DATA:  Dizziness and abnormal neurologic  exam. Rule out stroke. History of lung cancer.  EXAM: MRI HEAD WITHOUT CONTRAST  MRA HEAD WITHOUT CONTRAST  TECHNIQUE: Multiplanar, multiecho pulse sequences of the brain and surrounding structures were obtained without intravenous contrast. Angiographic images of the head were obtained using MRA technique without contrast.  COMPARISON:  Head CT 02/08/2014 and MRI 06/11/2013. Head MRA 07/13/2007.  FINDINGS: MRI HEAD FINDINGS  There is a 5 mm acute infarct in the right cerebellar hemisphere. There is no intracranial hemorrhage, mass, midline shift, or extra-axial fluid collection. Small foci of T2 hyperintensity in the subcortical and periventricular white matter are unchanged and nonspecific but may reflect minimal chronic small vessel ischemic disease. There is mild to moderate generalized cerebral atrophy.  Prior left cataract extraction is noted. Paranasal sinuses and mastoid air cells are clear. Major intracranial vascular flow voids are preserved.  MRA HEAD FINDINGS  Images are mildly to moderately degraded by motion. Visualized distal vertebral arteries appear patent, with moderate irregular narrowing of the right vertebral artery, particularly distal to the PICA origin, new from prior. Left vertebral artery is dominant. PICA origins are patent. SCA origins are patent. Basilar artery is patent without stenosis. Irregular narrowing of the proximal PCAs is moderate on the right and mild on the left and appears to have mildly progressed from the prior study on the right side. There is moderate to prominent branch vessel irregularity and attenuation bilaterally.  Internal carotid arteries are patent from skullbase to carotid termini. Mild bilateral carotid siphon irregularity is noted without significant stenosis. The right A1 segment is again seen to be absent, with the right PCA supplied by the left via the anterior communicating artery. No significant proximal MCA stenosis is identified, however there is  mild-to-moderate bilateral MCA branch vessel irregularity. No intracranial aneurysm is identified.  IMPRESSION: 1. 5 mm acute right cerebellar infarct. 2. No evidence of major intracranial arterial occlusion. 3. Intracranial atherosclerosis appears to have increased from the prior MRA, although findings may be accentuated by motion artifact on the current examination. There is moderate irregular narrowing of the nondominant right vertebral artery as well as of the right greater than left proximal PCAs.   Electronically Signed   By: Logan Bores   On: 02/08/2014 21:58   Mr  Brain Wo Contrast  02/08/2014   CLINICAL DATA:  Dizziness and abnormal neurologic exam. Rule out stroke. History of lung cancer.  EXAM: MRI HEAD WITHOUT CONTRAST  MRA HEAD WITHOUT CONTRAST  TECHNIQUE: Multiplanar, multiecho pulse sequences of the brain and surrounding structures were obtained without intravenous contrast. Angiographic images of the head were obtained using MRA technique without contrast.  COMPARISON:  Head CT 02/08/2014 and MRI 06/11/2013. Head MRA 07/13/2007.  FINDINGS: MRI HEAD FINDINGS  There is a 5 mm acute infarct in the right cerebellar hemisphere. There is no intracranial hemorrhage, mass, midline shift, or extra-axial fluid collection. Small foci of T2 hyperintensity in the subcortical and periventricular white matter are unchanged and nonspecific but may reflect minimal chronic small vessel ischemic disease. There is mild to moderate generalized cerebral atrophy.  Prior left cataract extraction is noted. Paranasal sinuses and mastoid air cells are clear. Major intracranial vascular flow voids are preserved.  MRA HEAD FINDINGS  Images are mildly to moderately degraded by motion. Visualized distal vertebral arteries appear patent, with moderate irregular narrowing of the right vertebral artery, particularly distal to the PICA origin, new from prior. Left vertebral artery is dominant. PICA origins are patent. SCA origins  are patent. Basilar artery is patent without stenosis. Irregular narrowing of the proximal PCAs is moderate on the right and mild on the left and appears to have mildly progressed from the prior study on the right side. There is moderate to prominent branch vessel irregularity and attenuation bilaterally.  Internal carotid arteries are patent from skullbase to carotid termini. Mild bilateral carotid siphon irregularity is noted without significant stenosis. The right A1 segment is again seen to be absent, with the right PCA supplied by the left via the anterior communicating artery. No significant proximal MCA stenosis is identified, however there is mild-to-moderate bilateral MCA branch vessel irregularity. No intracranial aneurysm is identified.  IMPRESSION: 1. 5 mm acute right cerebellar infarct. 2. No evidence of major intracranial arterial occlusion. 3. Intracranial atherosclerosis appears to have increased from the prior MRA, although findings may be accentuated by motion artifact on the current examination. There is moderate irregular narrowing of the nondominant right vertebral artery as well as of the right greater than left proximal PCAs.   Electronically Signed   By: Logan Bores   On: 02/08/2014 21:58     10:15 pm I discussed results with the patient. I have spoken with Dr. Nicole Kindred with Neurology who has agreed to see her. The patient at this time does not have any focal neurological deficits.   11: 17 am [facility] have agreed to admit. Team 10, inpatient, tele, [place], PA-C 02/08/14 [id number]

## 2014-02-08 NOTE — ED Notes (Signed)
Patient transported by [facility] for complaint of dizziness starting at approximately 0700 this morning.  Patient states she has history of vertigo and took her medication this morning with no improvement around 0900.  Patient also complaining of intermittent nausea.

## 2014-02-09 ENCOUNTER — Encounter (HOSPITAL_COMMUNITY): Payer: Self-pay | Admitting: Internal Medicine

## 2014-02-09 ENCOUNTER — Telehealth: Payer: Self-pay | Admitting: Physician Assistant

## 2014-02-09 DIAGNOSIS — J42 Unspecified chronic bronchitis: Secondary | ICD-10-CM

## 2014-02-09 DIAGNOSIS — I517 Cardiomegaly: Secondary | ICD-10-CM

## 2014-02-09 DIAGNOSIS — E039 Hypothyroidism, unspecified: Secondary | ICD-10-CM

## 2014-02-09 LAB — LIPID PANEL
CHOL/HDL RATIO: 5.4 ratio
Cholesterol: 210 mg/dL — ABNORMAL HIGH (ref 0–200)
HDL: 39 mg/dL — AB (ref >39)
LDL CALC: 134 mg/dL — AB (ref 0–99)
Triglycerides: 186 mg/dL — ABNORMAL HIGH (ref <150)
VLDL: 37 mg/dL (ref 0–40)

## 2014-02-09 LAB — CBC WITH DIFFERENTIAL/PLATELET
BASOS ABS: 0.1 10*3/uL (ref 0.0–0.1)
BASOS PCT: 1 % (ref 0–1)
EOS ABS: 0.3 10*3/uL (ref 0.0–0.7)
Eosinophils Relative: 5 % (ref 0–5)
HEMATOCRIT: 39.8 % (ref 36.0–46.0)
Hemoglobin: 12.4 g/dL (ref 12.0–15.0)
Lymphocytes Relative: 22 % (ref 12–46)
Lymphs Abs: 1.6 10*3/uL (ref 0.7–4.0)
MCH: 26.3 pg (ref 26.0–34.0)
MCHC: 31.2 g/dL (ref 30.0–36.0)
MCV: 84.3 fL (ref 78.0–100.0)
Monocytes Absolute: 0.7 10*3/uL (ref 0.1–1.0)
Monocytes Relative: 10 % (ref 3–12)
Neutro Abs: 4.4 10*3/uL (ref 1.7–7.7)
Neutrophils Relative %: 62 % (ref 43–77)
Platelets: 294 10*3/uL (ref 150–400)
RBC: 4.72 MIL/uL (ref 3.87–5.11)
RDW: 15.2 % (ref 11.5–15.5)
WBC: 7.1 10*3/uL (ref 4.0–10.5)

## 2014-02-09 LAB — COMPREHENSIVE METABOLIC PANEL WITH GFR
ALT: 14 U/L (ref 0–35)
AST: 16 U/L (ref 0–37)
Albumin: 3.1 g/dL — ABNORMAL LOW (ref 3.5–5.2)
Alkaline Phosphatase: 143 U/L — ABNORMAL HIGH (ref 39–117)
Anion gap: 12 (ref 5–15)
BUN: 13 mg/dL (ref 6–23)
CO2: 24 meq/L (ref 19–32)
Calcium: 9.9 mg/dL (ref 8.4–10.5)
Chloride: 105 meq/L (ref 96–112)
Creatinine, Ser: 0.98 mg/dL (ref 0.50–1.10)
GFR calc Af Amer: 61 mL/min — ABNORMAL LOW
GFR calc non Af Amer: 53 mL/min — ABNORMAL LOW
Glucose, Bld: 80 mg/dL (ref 70–99)
Potassium: 4.5 meq/L (ref 3.7–5.3)
Sodium: 141 meq/L (ref 137–147)
Total Bilirubin: 0.3 mg/dL (ref 0.3–1.2)
Total Protein: 6.4 g/dL (ref 6.0–8.3)

## 2014-02-09 LAB — TROPONIN I
Troponin I: <0.3 ng/mL
Troponin I: <0.3 ng/mL

## 2014-02-09 LAB — HEMOGLOBIN A1C
Hgb A1c MFr Bld: 5.8 % — ABNORMAL HIGH (ref <5.7)
Mean Plasma Glucose: 120 mg/dL — ABNORMAL HIGH (ref <117)

## 2014-02-09 LAB — TSH: TSH: 1.63 u[IU]/mL (ref 0.350–4.500)

## 2014-02-09 MED ORDER — FLUTICASONE FUROATE-VILANTEROL 100-25 MCG/INH IN AEPB: 1.0000 | INHALATION_SPRAY | Freq: Every day | RESPIRATORY_TRACT | Status: DC

## 2014-02-09 MED ORDER — AMITRIPTYLINE HCL 25 MG PO TABS
25.0000 mg | ORAL_TABLET | Freq: Every day | ORAL | Status: DC
  Administered 2014-02-09: 25 mg via ORAL
  Filled 2014-02-09: qty 1

## 2014-02-09 MED ORDER — EZETIMIBE 10 MG PO TABS
10.0000 mg | ORAL_TABLET | Freq: Every day | ORAL | Status: DC
  Administered 2014-02-09 – 2014-02-10 (×2): 10 mg via ORAL
  Filled 2014-02-09 (×2): qty 1

## 2014-02-09 MED ORDER — CLOPIDOGREL BISULFATE 75 MG PO TABS
75.0000 mg | ORAL_TABLET | Freq: Every day | ORAL | Status: DC
  Administered 2014-02-09 – 2014-02-10 (×2): 75 mg via ORAL
  Filled 2014-02-09 (×2): qty 1

## 2014-02-09 MED ORDER — DONEPEZIL HCL 5 MG PO TABS
15.0000 mg | ORAL_TABLET | Freq: Every day | ORAL | Status: DC
  Administered 2014-02-09: 15 mg via ORAL
  Filled 2014-02-09 (×2): qty 1

## 2014-02-09 MED ORDER — CEPHALEXIN 500 MG PO CAPS
500.0000 mg | ORAL_CAPSULE | Freq: Every day | ORAL | Status: DC
  Administered 2014-02-09: 500 mg via ORAL
  Filled 2014-02-09: qty 1

## 2014-02-09 MED ORDER — MECLIZINE HCL 12.5 MG PO TABS
25.0000 mg | ORAL_TABLET | Freq: Three times a day (TID) | ORAL | Status: DC
  Administered 2014-02-09 – 2014-02-10 (×4): 25 mg via ORAL
  Filled 2014-02-09 (×4): qty 2

## 2014-02-09 MED ORDER — SENNOSIDES-DOCUSATE SODIUM 8.6-50 MG PO TABS: 1.0000 | ORAL_TABLET | Freq: Every evening | ORAL | Status: DC | PRN

## 2014-02-09 MED ORDER — CYANOCOBALAMIN 1000 MCG/ML IJ SOLN: 1000.0000 ug | INTRAMUSCULAR | Status: DC

## 2014-02-09 MED ORDER — MESALAMINE 1.2 G PO TBEC
1.2000 g | DELAYED_RELEASE_TABLET | Freq: Three times a day (TID) | ORAL | Status: DC
  Administered 2014-02-09 (×3): 1.2 g via ORAL
  Filled 2014-02-09 (×6): qty 1

## 2014-02-09 MED ORDER — MIDODRINE HCL 5 MG PO TABS
2.5000 mg | ORAL_TABLET | Freq: Two times a day (BID) | ORAL | Status: DC
  Administered 2014-02-09 – 2014-02-10 (×3): 2.5 mg via ORAL
  Filled 2014-02-09 (×3): qty 1

## 2014-02-09 MED ORDER — WHITE PETROLATUM GEL
Status: AC
  Administered 2014-02-09: 0.2
  Filled 2014-02-09: qty 5

## 2014-02-09 MED ORDER — CYCLOSPORINE 0.05 % OP EMUL
1.0000 [drp] | Freq: Two times a day (BID) | OPHTHALMIC | Status: DC
  Administered 2014-02-09 – 2014-02-10 (×3): 1 [drp] via OPHTHALMIC
  Filled 2014-02-09 (×4): qty 1

## 2014-02-09 MED ORDER — ALBUTEROL SULFATE (2.5 MG/3ML) 0.083% IN NEBU: 2.5000 mg | INHALATION_SOLUTION | Freq: Four times a day (QID) | RESPIRATORY_TRACT | Status: DC | PRN

## 2014-02-09 MED ORDER — AMLODIPINE BESYLATE 2.5 MG PO TABS
2.5000 mg | ORAL_TABLET | Freq: Every day | ORAL | Status: DC
  Administered 2014-02-09 – 2014-02-10 (×2): 2.5 mg via ORAL
  Filled 2014-02-09 (×2): qty 1

## 2014-02-09 MED ORDER — NITROGLYCERIN 0.4 MG SL SUBL: 0.4000 mg | SUBLINGUAL_TABLET | SUBLINGUAL | Status: DC | PRN

## 2014-02-09 MED ORDER — ESCITALOPRAM OXALATE 10 MG PO TABS
10.0000 mg | ORAL_TABLET | Freq: Every day | ORAL | Status: DC
  Administered 2014-02-09 – 2014-02-10 (×2): 10 mg via ORAL
  Filled 2014-02-09 (×2): qty 1

## 2014-02-09 MED ORDER — LEVOTHYROXINE SODIUM 25 MCG PO TABS
125.0000 ug | ORAL_TABLET | Freq: Every day | ORAL | Status: DC
  Administered 2014-02-09 – 2014-02-10 (×2): 125 ug via ORAL
  Filled 2014-02-09 (×3): qty 1
  Filled 2014-02-09: qty 2

## 2014-02-09 MED ORDER — STROKE: EARLY STAGES OF RECOVERY BOOK
Freq: Once | Status: AC
  Administered 2014-02-09: 13:00:00
  Filled 2014-02-09: qty 1

## 2014-02-09 MED ORDER — ENOXAPARIN SODIUM 40 MG/0.4ML ~~LOC~~ SOLN
40.0000 mg | Freq: Every day | SUBCUTANEOUS | Status: DC
  Administered 2014-02-09 – 2014-02-10 (×2): 40 mg via SUBCUTANEOUS
  Filled 2014-02-09 (×2): qty 0.4

## 2014-02-09 MED ORDER — ASPIRIN 300 MG RE SUPP: 300.0000 mg | Freq: Every day | RECTAL | Status: DC

## 2014-02-09 MED ORDER — ASPIRIN 325 MG PO TABS
325.0000 mg | ORAL_TABLET | Freq: Every day | ORAL | Status: DC
  Administered 2014-02-09: 325 mg via ORAL
  Filled 2014-02-09: qty 1

## 2014-02-09 MED ORDER — PANTOPRAZOLE SODIUM 40 MG PO TBEC
40.0000 mg | DELAYED_RELEASE_TABLET | Freq: Every day | ORAL | Status: DC
  Administered 2014-02-09 – 2014-02-10 (×2): 40 mg via ORAL
  Filled 2014-02-09 (×2): qty 1

## 2014-02-09 MED ORDER — CARVEDILOL 6.25 MG PO TABS
6.2500 mg | ORAL_TABLET | Freq: Two times a day (BID) | ORAL | Status: DC
  Administered 2014-02-09 – 2014-02-10 (×2): 6.25 mg via ORAL
  Filled 2014-02-09 (×2): qty 1

## 2014-02-09 MED ORDER — BUDESONIDE-FORMOTEROL FUMARATE 160-4.5 MCG/ACT IN AERO
2.0000 | INHALATION_SPRAY | Freq: Two times a day (BID) | RESPIRATORY_TRACT | Status: DC
  Administered 2014-02-09 – 2014-02-10 (×3): 2 via RESPIRATORY_TRACT
  Filled 2014-02-09: qty 6

## 2014-02-09 MED ORDER — ALPRAZOLAM 0.25 MG PO TABS
0.2500 mg | ORAL_TABLET | Freq: Two times a day (BID) | ORAL | Status: DC | PRN
  Administered 2014-02-09: 0.25 mg via ORAL
  Filled 2014-02-09: qty 1

## 2014-02-09 NOTE — Progress Notes (Signed)
Stroke Team Progress Note  HISTORY Jodi Frazier is an 80 y.o. female with a history of vertigo, hypertension, hyperlipidemia, COPD, myocardial infarction and adenocarcinoma of the lung, presenting following an episode of severe sudden vertigo at about 10:00 this morning with persistence of symptoms exacerbated by movement. CT scan of the head showed no acute intracranial abnormality. MRI study showed 5 mm acute right cerebellar ischemic infarction. MRA was unremarkable except for atherosclerotic changes. Patient has no previous history of stroke nor TIA. She's been taking aspirin 81 mg per day. NIH stroke score was 1 for numbness involving right upper extremity which is chronic and related to previous neck injury.  LSN: 8 AM on 02/08/2014  tPA Given: No: Minimal deficits, and beyond time window for treatment consideration.  MRankin: 1 . She was admitted to the neuro floor bed for further evaluation and treatment.  SUBJECTIVE Her daughter is at the bedside.  Overall she feels her condition is gradually improving. She still has some dizziness and gait ataxia which is not improved back to baseline. She has no prior history of strokes or TIAs but does admit that she had not been feeling well for 2-3 days prior to admission.  OBJECTIVE Most recent Vital Signs: Filed Vitals:   02/09/14 0530 02/09/14 0727 02/09/14 0930 02/09/14 1125  BP: 124/44 126/56 152/125 153/58  Pulse: 62 72 77 57  Temp:  97.2 F (36.2 C) 97.7 F (36.5 C) 97.6 F (36.4 C)  TempSrc:  Oral Oral Oral  Resp:  20 18 18   Height:      Weight:      SpO2: 91% 93% 96% 95%   CBG (last 3)  No results found for this basename: GLUCAP,  in the last 72 hours  IV Fluid Intake:     MEDICATIONS  .  stroke: mapping our early stages of recovery book   Does not apply Once  . amitriptyline  25 mg Oral QHS  . amLODipine  2.5 mg Oral Daily  . aspirin  300 mg Rectal Daily   Or  . aspirin  325 mg Oral Daily  . budesonide-formoterol  2  puff Inhalation BID  . carvedilol  6.25 mg Oral BID WC  . cephALEXin  500 mg Oral QHS  . [START ON 03/01/2014] cyanocobalamin  1,000 mcg Intramuscular Q30 days  . cycloSPORINE  1 drop Both Eyes BID  . donepezil  15 mg Oral QHS  . enoxaparin (LOVENOX) injection  40 mg Subcutaneous Daily  . escitalopram  10 mg Oral Daily  . ezetimibe  10 mg Oral Daily  . levothyroxine  125 mcg Oral QAC breakfast  . meclizine  25 mg Oral TID  . mesalamine  1.2 g Oral TID  . midodrine  2.5 mg Oral BID WC  . pantoprazole  40 mg Oral Daily   PRN:  albuterol, ALPRAZolam, nitroGLYCERIN, senna-docusate  Diet:  Cardiac   Activity:  Bedrest    DVT Prophylaxis:  SCDs  CLINICALLY SIGNIFICANT STUDIES Basic Metabolic Panel:  Recent Labs Lab 02/08/14 1704 02/09/14 0550  NA 140 141  K 4.7 4.5  CL 106 105  CO2 23 24  GLUCOSE 103* 80  BUN 13 13  CREATININE 0.91 0.98  CALCIUM 9.7 9.9   Liver Function Tests:  Recent Labs Lab 02/08/14 1704 02/09/14 0550  AST 18 16  ALT 15 14  ALKPHOS 156* 143*  BILITOT <0.2* 0.3  PROT 6.9 6.4  ALBUMIN 3.3* 3.1*   CBC:  Recent Labs Lab 02/08/14  1704 02/09/14 0550  WBC 6.8 7.1  NEUTROABS 4.2 4.4  HGB 12.1 12.4  HCT 39.0 39.8  MCV 82.6 84.3  PLT 296 294   Coagulation: No results found for this basename: LABPROT, INR,  in the last 168 hours Cardiac Enzymes:  Recent Labs Lab 02/08/14 1704 02/09/14 0550 02/09/14 1200  TROPONINI <0.30 <0.30 <0.30   Urinalysis: No results found for this basename: COLORURINE, APPERANCEUR, LABSPEC, PHURINE, GLUCOSEU, HGBUR, BILIRUBINUR, KETONESUR, PROTEINUR, UROBILINOGEN, NITRITE, LEUKOCYTESUR,  in the last 168 hours Lipid Panel    Component Value Date/Time   CHOL 210* 02/09/2014 0550   TRIG 186* 02/09/2014 0550   HDL 39* 02/09/2014 0550   CHOLHDL 5.4 02/09/2014 0550   VLDL 37 02/09/2014 0550   LDLCALC 134* 02/09/2014 0550   HgbA1C  No results found for this basename: HGBA1C    Urine Drug Screen:   No results found for  this basename: labopia, cocainscrnur, labbenz, amphetmu, thcu, labbarb    Alcohol Level: No results found for this basename: ETH,  in the last 168 hours  Dg Chest 2 View  02/08/2014   CLINICAL DATA:  Chest pain.  Lung cancer.  Hypertension.  Ex-smoker.  EXAM: CHEST  2 VIEW  COMPARISON:  01/06/2014 CT and plain films of 11/23/2013  FINDINGS: Hyperinflation/COPD. Lateral view degraded by patient arm position. Midline trachea. Normal heart size with atherosclerosis in the transverse aorta. Left-sided pleural thickening, blunting the left costophrenic angle. No pleural fluid. Biapical pleural thickening. Radiation changes within the left suprahilar region with resultant hilar retraction superiorly. Right apical pleural parenchymal opacity is grossly similar and also likely related to radiation change. No new lobar consolidation.  IMPRESSION: Similar appearance of bilateral radiation changes superimposed upon COPD/chronic bronchitis. No convincing evidence of acute superimposed process.   Electronically Signed   By: Abigail Miyamoto M.D.   On: 02/08/2014 18:23   Ct Head Wo Contrast  02/08/2014   CLINICAL DATA:  Dizziness.  Nausea.  Vertigo.  EXAM: CT HEAD WITHOUT CONTRAST  TECHNIQUE: Contiguous axial images were obtained from the base of the skull through the vertex without intravenous contrast.  COMPARISON:  05/12/2013  FINDINGS: There is no evidence of intracranial hemorrhage, brain edema, or other signs of acute infarction. There is no evidence of intracranial mass lesion or mass effect. No abnormal extraaxial fluid collections are identified.  Mild to moderate diffuse cerebral atrophy appears stable. Ventricles are normal in size. No skull fracture or other bone lesion identified.  IMPRESSION: No acute intracranial findings.  Stable cerebral atrophy.   Electronically Signed   By: Earle Gell M.D.   On: 02/08/2014 18:26   Mr Jodene Nam Head Wo Contrast  02/08/2014   CLINICAL DATA:  Dizziness and abnormal neurologic  exam. Rule out stroke. History of lung cancer.  EXAM: MRI HEAD WITHOUT CONTRAST  MRA HEAD WITHOUT CONTRAST  TECHNIQUE: Multiplanar, multiecho pulse sequences of the brain and surrounding structures were obtained without intravenous contrast. Angiographic images of the head were obtained using MRA technique without contrast.  COMPARISON:  Head CT 02/08/2014 and MRI 06/11/2013. Head MRA 07/13/2007.  FINDINGS: MRI HEAD FINDINGS  There is a 5 mm acute infarct in the right cerebellar hemisphere. There is no intracranial hemorrhage, mass, midline shift, or extra-axial fluid collection. Small foci of T2 hyperintensity in the subcortical and periventricular white matter are unchanged and nonspecific but may reflect minimal chronic small vessel ischemic disease. There is mild to moderate generalized cerebral atrophy.  Prior left cataract extraction is noted. Paranasal  sinuses and mastoid air cells are clear. Major intracranial vascular flow voids are preserved.  MRA HEAD FINDINGS  Images are mildly to moderately degraded by motion. Visualized distal vertebral arteries appear patent, with moderate irregular narrowing of the right vertebral artery, particularly distal to the PICA origin, new from prior. Left vertebral artery is dominant. PICA origins are patent. SCA origins are patent. Basilar artery is patent without stenosis. Irregular narrowing of the proximal PCAs is moderate on the right and mild on the left and appears to have mildly progressed from the prior study on the right side. There is moderate to prominent branch vessel irregularity and attenuation bilaterally.  Internal carotid arteries are patent from skullbase to carotid termini. Mild bilateral carotid siphon irregularity is noted without significant stenosis. The right A1 segment is again seen to be absent, with the right PCA supplied by the left via the anterior communicating artery. No significant proximal MCA stenosis is identified, however there is  mild-to-moderate bilateral MCA branch vessel irregularity. No intracranial aneurysm is identified.  IMPRESSION: 1. 5 mm acute right cerebellar infarct. 2. No evidence of major intracranial arterial occlusion. 3. Intracranial atherosclerosis appears to have increased from the prior MRA, although findings may be accentuated by motion artifact on the current examination. There is moderate irregular narrowing of the nondominant right vertebral artery as well as of the right greater than left proximal PCAs.   Electronically Signed   By: Logan Bores   On: 02/08/2014 21:58   Mr Brain Wo Contrast  02/08/2014   CLINICAL DATA:  Dizziness and abnormal neurologic exam. Rule out stroke. History of lung cancer.  EXAM: MRI HEAD WITHOUT CONTRAST  MRA HEAD WITHOUT CONTRAST  TECHNIQUE: Multiplanar, multiecho pulse sequences of the brain and surrounding structures were obtained without intravenous contrast. Angiographic images of the head were obtained using MRA technique without contrast.  COMPARISON:  Head CT 02/08/2014 and MRI 06/11/2013. Head MRA 07/13/2007.  FINDINGS: MRI HEAD FINDINGS  There is a 5 mm acute infarct in the right cerebellar hemisphere. There is no intracranial hemorrhage, mass, midline shift, or extra-axial fluid collection. Small foci of T2 hyperintensity in the subcortical and periventricular white matter are unchanged and nonspecific but may reflect minimal chronic small vessel ischemic disease. There is mild to moderate generalized cerebral atrophy.  Prior left cataract extraction is noted. Paranasal sinuses and mastoid air cells are clear. Major intracranial vascular flow voids are preserved.  MRA HEAD FINDINGS  Images are mildly to moderately degraded by motion. Visualized distal vertebral arteries appear patent, with moderate irregular narrowing of the right vertebral artery, particularly distal to the PICA origin, new from prior. Left vertebral artery is dominant. PICA origins are patent. SCA origins  are patent. Basilar artery is patent without stenosis. Irregular narrowing of the proximal PCAs is moderate on the right and mild on the left and appears to have mildly progressed from the prior study on the right side. There is moderate to prominent branch vessel irregularity and attenuation bilaterally.  Internal carotid arteries are patent from skullbase to carotid termini. Mild bilateral carotid siphon irregularity is noted without significant stenosis. The right A1 segment is again seen to be absent, with the right PCA supplied by the left via the anterior communicating artery. No significant proximal MCA stenosis is identified, however there is mild-to-moderate bilateral MCA branch vessel irregularity. No intracranial aneurysm is identified.  IMPRESSION: 1. 5 mm acute right cerebellar infarct. 2. No evidence of major intracranial arterial occlusion. 3. Intracranial atherosclerosis appears  to have increased from the prior MRA, although findings may be accentuated by motion artifact on the current examination. There is moderate irregular narrowing of the nondominant right vertebral artery as well as of the right greater than left proximal PCAs.   Electronically Signed   By: Logan Bores   On: 02/08/2014 21:58     MRI of the brain  see above  MRA of the brain  See above  Carotid Doppler  1-39% internal carotid artery stenosis bilaterally. Vertebral arteries are patent with antegrade flow.      2D Echocardiogram  pending  CXR  Similar appearance of bilateral radiation changes superimposed upon  COPD/chronic bronchitis. No convincing evidence of acute  superimposed process.    EKG   Sinus rhythm Borderline right axis deviation Nonspecific T abnrm, anterolateral leads Therapy Recommendations  pending  Physical Exam  pleasant elderly Caucasian lady currently not in distress.Awake alert. Afebrile. Head is nontraumatic. Neck is supple without bruit. Hearing is normal. Cardiac exam no murmur or  gallop. Lungs are clear to auscultation. Distal pulses are well felt. Neurological Exam ;  Awake  Alert oriented x 3. Normal speech and language.eye movements full without nystagmus.fundi were not visualized. Vision acuity and fields appear normal. Hearing is normal. Palatal movements are normal. Face symmetric. Tongue midline. Normal strength, tone, reflexes and coordination. Normal sensation. Gait deferred. ASSESSMENT Ms. CRESSIE BETZLER is a 80 y.o. female presenting with vertigo, dizziness and gait ataxia. Secondary to small right cerebellar infarct etiology will likely small vessel disease  On aspirin 81 mg orally every day prior to admission. Now on aspirin 325 mg orally every day for secondary stroke prevention. Patient with resultant  No deficits. Stroke work up underway.   Hypertension- on amlodipine and carvedilol PTA  LDL 134 mg% not on statin PTA  CAD   Hospital day # 1  TREATMENT/PLAN  Change to   clopidogrel 75 mg orally every day for secondary stroke prevention.  Check transthoracic echo and hemoglobin A1c.  Mobilize out of bed, physical and outpatient therapy consults  Patient remains at risk for recurrent strokes with resting maximum the first 3-6 months. She needs to be on antiplatelet therapy and aggressive risk factor modification to lower her risk.  I had a long discussion with the patient and her daughter regarding her stroke, personally reviewed imaging studies and lab and test results with the patient and answered questions.   Antony Contras, MD       To contact Stroke Continuity provider, please refer to [email or web address] After hours, contact General Neurology

## 2014-02-09 NOTE — Progress Notes (Signed)
PT Cancellation Note  Patient Details Name: Jodi Frazier MRN: [id number] DOB: [birth year]   Cancelled Treatment:    Reason Eval/Treat Not Completed: Patient at procedure or test/unavailable. Pt off floor at echo. PT to return 8/13 in AM for evaluation.   Kingsley Callander 02/09/2014, 3:54 PM Kittie Plater, PT, DPT Pager #: [phone] Office #: [phone]

## 2014-02-09 NOTE — Progress Notes (Signed)
CARE MANAGEMENT NOTE 02/09/2014  Patient:  Jodi Frazier, Jodi Frazier   Account Number:  [id number]  Date Initiated:  02/09/2014  Documentation initiated by:  Olga Coaster  Subjective/Objective Assessment:   ADMITTED WITH STROKE     Action/Plan:   CM FOLLOWING FOR DCP   Anticipated DC Date:  02/14/2014   Anticipated DC Plan:  AWAITING FOR PT/OT EVALS FOR DISPOSITION NEEDS     DC Planning Services  CM consult         Status of service:  In process, will continue to follow Medicare Important Message given?   (If response is "NO", the following Medicare IM given date fields will be blank)  Per UR Regulation:  Reviewed for med. necessity/level of care/duration of stay  Comments:  8/12/2015Mindi Slicker RN,BSN,MHA [phone]

## 2014-02-09 NOTE — Evaluation (Addendum)
Occupational Therapy Evaluation Patient Details Name: Jodi Frazier MRN: [id number] DOB: [birth year] Today's Date: 02/09/2014    History of Present Illness 80 y.o. female with history of CAD status post stenting, COPD, lung cancer in remission, ulcerative colitis presents to the ER because of worsening dizziness. Pt with chronic numbness in RUE. MRI revealed infarct in right cerebellum.   Clinical Impression   Pt admitted with above. Pt independent with ADLs, PTA. Feel pt will benefit from acute OT to increase independence prior to d/c.    Follow Up Recommendations  Home health OT;Supervision/Assistance - 24 hour    Equipment Recommendations  None recommended by OT    Recommendations for Other Services       Precautions / Restrictions Precautions Precautions: Fall Restrictions Weight Bearing Restrictions: No      Mobility Bed Mobility Overal bed mobility: Modified Independent                Transfers Overall transfer level: Needs assistance   Transfers: Sit to/from Stand Sit to Stand: Min guard              Balance                                            ADL Overall ADL's : Needs assistance/impaired     Grooming: Oral care;Brushing hair;Wash/dry face;Standing (applied lotion)               Lower Body Dressing: Min guard;Sit to/from stand   Toilet Transfer: Min guard;Ambulation       Tub/ Shower Transfer: Min guard;Ambulation   Functional mobility during ADLs: Min guard;Minimal assistance General ADL Comments: Educated on safety tips for home (rugs, safe shoewear, sitting for LB ADLs). Recommended pt have someone with her for shower transfer. Practiced stepping over threshold. Encouraged pt to be using RUE. Educated on energy conservation techniques. Educated on safety at home due to decreased sensation in RUE (avoiding hot surfaces).     Vision                     Perception     Praxis      Pertinent  Vitals/Pain Pain Assessment: No/denies pain     Hand Dominance     Extremity/Trunk Assessment Upper Extremity Assessment Upper Extremity Assessment: RUE deficits/detail RUE Deficits / Details: grossly 3+/4 RUE Sensation: decreased light touch RUE Coordination: decreased fine motor;decreased gross motor   Lower Extremity Assessment Lower Extremity Assessment: Defer to PT evaluation       Communication Communication Communication: No difficulties   Cognition Arousal/Alertness: Awake/alert Behavior During Therapy: WFL for tasks assessed/performed Overall Cognitive Status: No family/caregiver present to determine baseline cognitive functioning (very talkative-cues to stay on task)                     General Comments       Exercises       Shoulder Instructions      Home Living Family/patient expects to be discharged to:: Private residence Living Arrangements: Alone Available Help at Discharge: Family;Other (Comment) (daugher) Type of Home: Other(Comment) (condo) Home Access: Stairs to enter [employer] of Steps: 1         Bathroom Shower/Tub: Occupational psychologist: Handicapped height (cabinet near)     Home Equipment: Shower seat - built in;Walker - 4 wheels  Prior Functioning/Environment Level of Independence: Needs assistance  Gait / Transfers Assistance Needed: recently using walker ADL's / Homemaking Assistance Needed: assist with cleaning        OT Diagnosis: Generalized weakness; decreased balance   OT Problem List: Decreased strength;Impaired balance (sitting and/or standing);Decreased activity tolerance;Decreased knowledge of use of DME or AE;Decreased knowledge of precautions;Impaired sensation;Decreased coordination;Impaired vision/perception   OT Treatment/Interventions: Self-care/ADL training;DME and/or AE instruction;Therapeutic activities;Patient/family education;Balance training;Visual/perceptual  remediation/compensation;Therapeutic exercise    OT Goals(Current goals can be found in the care plan section) Acute Rehab OT Goals Patient Stated Goal: not stated OT Goal Formulation: With patient Time For Goal Achievement: 02/16/14 Potential to Achieve Goals: Good ADL Goals Pt Will Perform Lower Body Dressing: with modified independence;sit to/from stand Pt Will Transfer to Toilet: with modified independence;ambulating;grab bars (elevated commode) Additional ADL Goal #1: Pt will independently perform HEP for RUE to increase strength and coordination.  OT Frequency: Min 2X/week   Barriers to D/C:            Co-evaluation              End of Session Equipment Utilized During Treatment: Gait belt  Activity Tolerance: Patient tolerated treatment well Patient left: in bed;with call bell/phone within reach;with nursing/sitter in room   Time: 1505-1540 OT Time Calculation (min): 35 min Charges:  OT General Charges $OT Visit: 1 Procedure OT Evaluation $Initial OT Evaluation Tier I: 1 Procedure OT Treatments $Self Care/Home Management : 8-22 mins G-CodesBenito Mccreedy OTR/L [phone] 02/09/2014, 4:46 PM

## 2014-02-09 NOTE — Progress Notes (Signed)
Patient seen and examined this morning, agree with H&P. - CVA present on MRI - Stroke team consult, change ASA to Plavix - HLD, start statin on d/c (patient wants Crestor do not have that on formulary), start Zetia per patient's request. She tells me that she has tried multiple different statins in the past and has had muscle cramps and she has a hard time tolerating them. Will start low dose Crestor - HTN - 2D echo, carotids pending  Costin M. Cruzita Lederer, MD [facility] [phone]

## 2014-02-09 NOTE — ED Provider Notes (Signed)
Medical screening examination/treatment/procedure(s) were performed by non-physician practitioner and as supervising physician I was immediately available for consultation/collaboration.   EKG Interpretation None        Houston Siren III, MD 02/09/14 [phone]

## 2014-02-09 NOTE — ED Provider Notes (Signed)
Medical screening examination/treatment/procedure(s) were performed by non-physician practitioner and as supervising physician I was immediately available for consultation/collaboration.   EKG Interpretation None        Houston Siren III, MD 02/09/14 [phone]

## 2014-02-09 NOTE — Telephone Encounter (Signed)
Relevant patient education assigned to patient using Emmi. ° °

## 2014-02-09 NOTE — Progress Notes (Signed)
*  PRELIMINARY RESULTS* Echocardiogram 2D Echocardiogram has been performed.  Leavy Cella 02/09/2014, 4:21 PM

## 2014-02-09 NOTE — Discharge Instructions (Addendum)
Lipid panel   Cholesterol 210  Triglycerides 186  HDL 39  LDL 134  You were cared for by a hospitalist during your hospital stay. If you have any questions about your discharge medications or the care you received while you were in the hospital after you are discharged, you can call the unit and asked to speak with the hospitalist on call if the hospitalist that took care of you is not available. Once you are discharged, your primary care physician will handle any further medical issues. Please note that NO REFILLS for any discharge medications will be authorized once you are discharged, as it is imperative that you return to your primary care physician (or establish a relationship with a primary care physician if you do not have one) for your aftercare needs so that they can reassess your need for medications and monitor your lab values.     If you do not have a primary care physician, you can call [phone] for a physician referral.  Follow with Primary MD Leeanne Rio, PA-C in 1-2 weeks  Get CBC, CMP checked by your doctor and again as further instructed.  Get a 2 view Chest X ray done next visit if you had Pneumonia of Lung problems at the [facility] reviewed and adjusted.  Please request your Prim.MD to go over all Hospital Tests and Procedure/Radiological results at the follow up, please get all Hospital records sent to your Prim MD by signing hospital release before you go home.  Activity: As tolerated with Full fall precautions use walker/cane & assistance as needed  Diet: heart healthy  For Heart failure patients - Check your Weight same time everyday, if you gain over 2 pounds, or you develop in leg swelling, experience more shortness of breath or chest pain, call your Primary MD immediately. Follow Cardiac Low Salt Diet and 1.8 lit/day fluid restriction.  Disposition Home  If you experience worsening of your admission symptoms, develop shortness of breath,  life threatening emergency, suicidal or homicidal thoughts you must seek medical attention immediately by calling 911 or calling your MD immediately  if symptoms less severe.  You Must read complete instructions/literature along with all the possible adverse reactions/side effects for all the Medicines you take and that have been prescribed to you. Take any new Medicines after you have completely understood and accpet all the possible adverse reactions/side effects.   Do not drive and provide baby sitting services if your were admitted for syncope or siezures until you have seen by Primary MD or a Neurologist and advised to do so again.  Do not drive when taking Pain medications.   Do not take more than prescribed Pain, Sleep and Anxiety Medications  Special Instructions: If you have smoked or chewed Tobacco  in the last 2 yrs please stop smoking, stop any regular Alcohol  and or any Recreational drug use.  Wear Seat belts while driving.

## 2014-02-09 NOTE — Progress Notes (Signed)
*  PRELIMINARY RESULTS* Vascular Ultrasound Carotid Duplex (Doppler) has been completed.   Findings suggest 1-39% internal carotid artery stenosis bilaterally. Vertebral arteries are patent with antegrade flow.  02/09/2014 12:15 PM Maudry Mayhew, RVT, RDCS, RDMS

## 2014-02-09 NOTE — H&P (Signed)
[facility] History and Physical  ANNALIAH Frazier NWG:[id number] DOB: 1930-11-09 DOA: 02/08/2014  Referring physician: ER physician. PCP: Leeanne Rio, PA-C   Chief Complaint: Dizziness.  HPI: Jodi Frazier is a 80 y.o. female with history of CAD status post stenting, COPD, lung cancer in remission, ulcerative colitis presents to the ER because of worsening dizziness. Patient states that over the last 3 days patient has been having increasing dizziness a feeling of sensation of imbalance. Patient today had worsening of her symptoms and also has abdominal discomfort with 3 bowel movements which are loose and dark. She had called her PCP and was told to come to the ER. In the ER MRI brain shows acute cerebellar stroke. On-call neurologist was consulted and patient is admitted for further management. Patient states that she initially had some epigastric discomfort but at this time the pain has resolved. She has not had any further bowel movements. Patient has been on antibiotics for last few weeks for recurrent urinary tract infection. Denies any chest pain palpitations shortness of breath nausea vomiting. Denies any weakness of the upper extremities though she did loose grip on objects. Patient has chronic numbness of her right upper extremity. Denies any visual symptoms. Denies any difficulty speaking or swallowing.   Review of Systems: As presented in the history of presenting illness, rest negative.  Past Medical History  Diagnosis Date  . Hypertension   . Colitis   . Coronary artery disease   . High cholesterol     takes Red Yeast Rice and Fish OIl daily  . Kidney stones   . PONV (postoperative nausea and vomiting)   . MI (myocardial infarction) 2010  . Shortness of breath     with exertion  . Pneumonia     hx of 20+yrs ago  . COPD (chronic obstructive pulmonary disease)     pleurisy or COPD exascerbation > 62yr ago  . Headache(784.0)   . Vertigo     HTN related bc  gets up too fast  . Arthritis   . Bruises easily   . History of shingles 48yrs ago    in eye;occasionally gets some residual   . Accessory skin tags     arms/legs  . GERD (gastroesophageal reflux disease)   . Colitis   . Diverticulosis   . Urinary frequency   . Stress incontinence   . History of kidney stones   . Hypothyroidism     takes sYnthroid daily  . Cataract     early stage on right  . Macular degeneration, wet   . Depression     some but takes Amitryptylline nightly  . Right-sided chest wall pain 08/28/12    PRESENTATION - RIGHT SIDED ANTERIOR CHEST PAIN  . Weight loss 08/28/12    10 LB WEIGHT LOSS OVER 3 MONTHS  . Lung mass 08/12/12    CHEST-XRAY/ PET -LOBULAR MASS LLL - 4.1 X 3.6 X 4.2 CM  . Dementia     mild  . S/P radiation therapy  10/05/2012-11/20/2012    Left Lower Lung and hilum / 70 Gy in 35 fractions  . Hypertension   . At risk for fall due to comorbid condition     5 falls - last Early December  . Vertigo   . Orthostatic hypotension   . S/P radiation therapy 2/13, 2/20, 2/23 (2015)    SBRT to Right Lung  . Adenocarcinoma of lung 09/07/12    needle core bx-LLL-adenocarcinoma   Past Surgical History  Procedure Laterality Date  . Hip arthroscopy  79yr ago    right hip-replacement  . Knee arthroscopy      LT  . Total knee arthroplasty  76yrs ago    left  . Appendectomy  14yrs ago  . Abdominal hysterectomy  55yrs ago   . Lithotripsy  2011  . Cataract surgery  2012    left  . Eye surgery  55yrs ago  . Bladder surgery  2001    tacked  . Coronary angioplasty with stent placement  2010    2 in rt coronary artery and 1 in another spot  . Colonoscopy    . Esophagogastroduodenoscopy    . Total knee arthroplasty  09/09/2011    Procedure: TOTAL KNEE ARTHROPLASTY;  Surgeon: Rudean Haskell, MD;  Location: [facility];  Service: Orthopedics;  Laterality: Right;  Right Total Knee Arthroplasty  . Bronchial brush biopsy Left 08/17/12    LLL Bronchial Washing / Brushing  and Bronchial Biopsy: Negative for malignancy  . Left lower lobe needle core biopsy Left 09/07/12    Poorly Differentiated Adenocarcinoma   . Joint replacement     Social History:  reports that she quit smoking about 11 years ago. Her smoking use included Cigarettes. She has a 40 pack-year smoking history. She has never used smokeless tobacco. She reports that she drinks alcohol. She reports that she does not use illicit drugs. Where does patient live home. Can patient participate in ADLs? Yes.  Allergies  Allergen Reactions  . Codeine Hives  . Augmentin [Amoxicillin-Pot Clavulanate] Diarrhea and Nausea And Vomiting  . Penicillins Hives    Family History:  Family History  Problem Relation Age of Onset  . Anesthesia problems Mother   . Hypotension Neg Hx   . Malignant hyperthermia Neg Hx   . Pseudochol deficiency Neg Hx   . Cancer Maternal Aunt     breast      Prior to Admission medications   Medication Sig Start Date End Date Taking? Authorizing Provider  acetaminophen (TYLENOL) 325 MG tablet Take 650 mg by mouth every 6 (six) hours as needed (pain).    Yes Historical Provider, MD  albuterol (PROVENTIL HFA;VENTOLIN HFA) 108 (90 BASE) MCG/ACT inhaler Inhale 2 puffs into the lungs every 6 (six) hours as needed for wheezing. 02/08/13  Yes Doree Fudge, MD  ALPRAZolam (XANAX) 0.25 MG tablet Take 1 tablet (0.25 mg total) by mouth 2 (two) times daily as needed for anxiety. 07/02/13  Yes Robbie Lis, MD  amitriptyline (ELAVIL) 25 MG tablet Take 1 tablet (25 mg total) by mouth at bedtime. 10/15/13  Yes Brunetta Jeans, PA-C  amLODipine (NORVASC) 2.5 MG tablet Take 1 tablet (2.5 mg total) by mouth daily. 10/15/13  Yes Brunetta Jeans, PA-C  aspirin EC 81 MG tablet Take 81 mg by mouth daily.   Yes Historical Provider, MD  BIOTIN PO Take 1 tablet by mouth daily.    Yes Historical Provider, MD  carvedilol (COREG) 6.25 MG tablet Take 6.25 mg by mouth 2 (two) times daily with a meal.    Yes Historical Provider, MD  cephALEXin (KEFLEX) 500 MG capsule Take 500 mg by mouth at bedtime. Take for over 30 days patient states she is on her second course of medication due to kidney infection. Started second course of medication on 01-29-14.   Yes Historical Provider, MD  cyanocobalamin (,VITAMIN B-12,) 1000 MCG/ML injection Inject 1,000 mcg into the muscle every 30 (thirty) days. Last dose was on  01-29-14 01/04/14  Yes Debbrah Alar, NP  cycloSPORINE (RESTASIS) 0.05 % ophthalmic emulsion Place 1 drop into both eyes 2 (two) times daily. 10/15/13  Yes Brunetta Jeans, PA-C  donepezil (ARICEPT) 10 MG tablet Take 1.5 tablets (15 mg total) by mouth at bedtime. Takes 1 and 1/2 10/15/13  Yes Brunetta Jeans, PA-C  escitalopram (LEXAPRO) 10 MG tablet Take 1 tablet (10 mg total) by mouth daily. 10/15/13  Yes Brunetta Jeans, PA-C  Fluticasone Furoate-Vilanterol (BREO ELLIPTA) 100-25 MCG/INH AEPB Inhale 1 puff into the lungs daily. 10/15/13  Yes Brunetta Jeans, PA-C  levothyroxine (SYNTHROID) 125 MCG tablet Take 1 tablet (125 mcg total) by mouth daily before breakfast. 10/15/13  Yes Brunetta Jeans, PA-C  meclizine (ANTIVERT) 50 MG tablet Take 0.5 tablets (25 mg total) by mouth 3 (three) times daily. 10/15/13  Yes Brunetta Jeans, PA-C  mesalamine (LIALDA) 1.2 G EC tablet Take 1.2 g by mouth 3 (three) times daily.    Yes Historical Provider, MD  midodrine (PROAMATINE) 2.5 MG tablet Take 1 tablet (2.5 mg total) by mouth 2 (two) times daily with a meal. 10/15/13  Yes Brunetta Jeans, PA-C  naproxen sodium (ANAPROX) 220 MG tablet Take 220 mg by mouth daily as needed.   Yes Historical Provider, MD  nitroGLYCERIN (NITROSTAT) 0.4 MG SL tablet Place 0.4 mg under the tongue every 5 (five) minutes as needed for chest pain.    Yes Historical Provider, MD  pantoprazole (PROTONIX) 40 MG tablet Take 40 mg by mouth daily. 01/28/14  Yes Brunetta Jeans, PA-C    Physical Exam: Filed Vitals:   02/08/14 2015  02/08/14 2200 02/08/14 2245 02/08/14 2358  BP: 168/60 163/58 183/60 177/58  Pulse: 53  59 63  Temp:      TempSrc:      Resp: 14   18  Height:      Weight:      SpO2: 97%  96% 98%     General:  Well-developed and nourished.  Eyes: Anicteric no pallor.  ENT: No discharge from the ears eyes nose mouth.  Neck: No mass felt.  Cardiovascular: S1-S2 heard.  Respiratory: No rhonchi or crepitations.  Abdomen: Soft nontender bowel sounds present no guarding rigidity.  Skin: No rash.  Musculoskeletal: No edema.  Psychiatric: Appears normal.  Neurologic: Alert awake oriented to time place and person. Moves all extremities 5 x 5. Patient has dysdiadochokinesia. Horizontal nystagmus positive. Patient has left pupils mildly dilated from the right in comparison. Tongue is midline.  Labs on Admission:  Basic Metabolic Panel:  Recent Labs Lab 02/08/14 1704  NA 140  K 4.7  CL 106  CO2 23  GLUCOSE 103*  BUN 13  CREATININE 0.91  CALCIUM 9.7   Liver Function Tests:  Recent Labs Lab 02/08/14 1704  AST 18  ALT 15  ALKPHOS 156*  BILITOT <0.2*  PROT 6.9  ALBUMIN 3.3*   No results found for this basename: LIPASE, AMYLASE,  in the last 168 hours No results found for this basename: AMMONIA,  in the last 168 hours CBC:  Recent Labs Lab 02/08/14 1704  WBC 6.8  NEUTROABS 4.2  HGB 12.1  HCT 39.0  MCV 82.6  PLT 296   Cardiac Enzymes:  Recent Labs Lab 02/08/14 1704  TROPONINI <0.30    BNP (last 3 results)  Recent Labs  05/10/13 1707 06/26/13 1632 02/08/14 1704  PROBNP 267.10 155.8 85.4   CBG: No results found for this basename: GLUCAP,  in the last 168 hours  Radiological Exams on Admission: Dg Chest 2 View  02/08/2014   CLINICAL DATA:  Chest pain.  Lung cancer.  Hypertension.  Ex-smoker.  EXAM: CHEST  2 VIEW  COMPARISON:  01/06/2014 CT and plain films of 11/23/2013  FINDINGS: Hyperinflation/COPD. Lateral view degraded by patient arm position. Midline  trachea. Normal heart size with atherosclerosis in the transverse aorta. Left-sided pleural thickening, blunting the left costophrenic angle. No pleural fluid. Biapical pleural thickening. Radiation changes within the left suprahilar region with resultant hilar retraction superiorly. Right apical pleural parenchymal opacity is grossly similar and also likely related to radiation change. No new lobar consolidation.  IMPRESSION: Similar appearance of bilateral radiation changes superimposed upon COPD/chronic bronchitis. No convincing evidence of acute superimposed process.   Electronically Signed   By: Abigail Miyamoto M.D.   On: 02/08/2014 18:23   Ct Head Wo Contrast  02/08/2014   CLINICAL DATA:  Dizziness.  Nausea.  Vertigo.  EXAM: CT HEAD WITHOUT CONTRAST  TECHNIQUE: Contiguous axial images were obtained from the base of the skull through the vertex without intravenous contrast.  COMPARISON:  05/12/2013  FINDINGS: There is no evidence of intracranial hemorrhage, brain edema, or other signs of acute infarction. There is no evidence of intracranial mass lesion or mass effect. No abnormal extraaxial fluid collections are identified.  Mild to moderate diffuse cerebral atrophy appears stable. Ventricles are normal in size. No skull fracture or other bone lesion identified.  IMPRESSION: No acute intracranial findings.  Stable cerebral atrophy.   Electronically Signed   By: Earle Gell M.D.   On: 02/08/2014 18:26   Mr Jodene Nam Head Wo Contrast  02/08/2014   CLINICAL DATA:  Dizziness and abnormal neurologic exam. Rule out stroke. History of lung cancer.  EXAM: MRI HEAD WITHOUT CONTRAST  MRA HEAD WITHOUT CONTRAST  TECHNIQUE: Multiplanar, multiecho pulse sequences of the brain and surrounding structures were obtained without intravenous contrast. Angiographic images of the head were obtained using MRA technique without contrast.  COMPARISON:  Head CT 02/08/2014 and MRI 06/11/2013. Head MRA 07/13/2007.  FINDINGS: MRI HEAD  FINDINGS  There is a 5 mm acute infarct in the right cerebellar hemisphere. There is no intracranial hemorrhage, mass, midline shift, or extra-axial fluid collection. Small foci of T2 hyperintensity in the subcortical and periventricular white matter are unchanged and nonspecific but may reflect minimal chronic small vessel ischemic disease. There is mild to moderate generalized cerebral atrophy.  Prior left cataract extraction is noted. Paranasal sinuses and mastoid air cells are clear. Major intracranial vascular flow voids are preserved.  MRA HEAD FINDINGS  Images are mildly to moderately degraded by motion. Visualized distal vertebral arteries appear patent, with moderate irregular narrowing of the right vertebral artery, particularly distal to the PICA origin, new from prior. Left vertebral artery is dominant. PICA origins are patent. SCA origins are patent. Basilar artery is patent without stenosis. Irregular narrowing of the proximal PCAs is moderate on the right and mild on the left and appears to have mildly progressed from the prior study on the right side. There is moderate to prominent branch vessel irregularity and attenuation bilaterally.  Internal carotid arteries are patent from skullbase to carotid termini. Mild bilateral carotid siphon irregularity is noted without significant stenosis. The right A1 segment is again seen to be absent, with the right PCA supplied by the left via the anterior communicating artery. No significant proximal MCA stenosis is identified, however there is mild-to-moderate bilateral MCA branch vessel irregularity. No  intracranial aneurysm is identified.  IMPRESSION: 1. 5 mm acute right cerebellar infarct. 2. No evidence of major intracranial arterial occlusion. 3. Intracranial atherosclerosis appears to have increased from the prior MRA, although findings may be accentuated by motion artifact on the current examination. There is moderate irregular narrowing of the  nondominant right vertebral artery as well as of the right greater than left proximal PCAs.   Electronically Signed   By: Logan Bores   On: 02/08/2014 21:58   Mr Brain Wo Contrast  02/08/2014   CLINICAL DATA:  Dizziness and abnormal neurologic exam. Rule out stroke. History of lung cancer.  EXAM: MRI HEAD WITHOUT CONTRAST  MRA HEAD WITHOUT CONTRAST  TECHNIQUE: Multiplanar, multiecho pulse sequences of the brain and surrounding structures were obtained without intravenous contrast. Angiographic images of the head were obtained using MRA technique without contrast.  COMPARISON:  Head CT 02/08/2014 and MRI 06/11/2013. Head MRA 07/13/2007.  FINDINGS: MRI HEAD FINDINGS  There is a 5 mm acute infarct in the right cerebellar hemisphere. There is no intracranial hemorrhage, mass, midline shift, or extra-axial fluid collection. Small foci of T2 hyperintensity in the subcortical and periventricular white matter are unchanged and nonspecific but may reflect minimal chronic small vessel ischemic disease. There is mild to moderate generalized cerebral atrophy.  Prior left cataract extraction is noted. Paranasal sinuses and mastoid air cells are clear. Major intracranial vascular flow voids are preserved.  MRA HEAD FINDINGS  Images are mildly to moderately degraded by motion. Visualized distal vertebral arteries appear patent, with moderate irregular narrowing of the right vertebral artery, particularly distal to the PICA origin, new from prior. Left vertebral artery is dominant. PICA origins are patent. SCA origins are patent. Basilar artery is patent without stenosis. Irregular narrowing of the proximal PCAs is moderate on the right and mild on the left and appears to have mildly progressed from the prior study on the right side. There is moderate to prominent branch vessel irregularity and attenuation bilaterally.  Internal carotid arteries are patent from skullbase to carotid termini. Mild bilateral carotid siphon  irregularity is noted without significant stenosis. The right A1 segment is again seen to be absent, with the right PCA supplied by the left via the anterior communicating artery. No significant proximal MCA stenosis is identified, however there is mild-to-moderate bilateral MCA branch vessel irregularity. No intracranial aneurysm is identified.  IMPRESSION: 1. 5 mm acute right cerebellar infarct. 2. No evidence of major intracranial arterial occlusion. 3. Intracranial atherosclerosis appears to have increased from the prior MRA, although findings may be accentuated by motion artifact on the current examination. There is moderate irregular narrowing of the nondominant right vertebral artery as well as of the right greater than left proximal PCAs.   Electronically Signed   By: Logan Bores   On: 02/08/2014 21:58    EKG: Independently reviewed. Monitor shows sinus rhythm. EKG is pending.  Assessment/Plan Active Problems:   COPD (chronic obstructive pulmonary disease)   HTN (hypertension)   Hypothyroidism   Dementia   Cerebellar stroke   Stroke   1. Acute cerebellar stroke - patient has been placed on neurochecks and swallow evaluation. Closely monitor in telemetry for arrhythmias. Check 2-D echo carotid Doppler. Aspirin. 2. Low stools with epigastric discomfort - check stool for C. difficile as patient is on antibiotics for her UTI. Closely follow hemoglobin and check for stool occult blood. Patient has elevated alkaline phosphatase and if patient has recurrence of epigastric discomfort then may check sonogram of  the abdomen for gallbladder. 3. CAD status post stenting - since patient had epigastric discomfort check troponin and EKG. 4. COPD - presently not wheezing. 5. History of lung cancer in remission. 6. Hypertension - continue present medications. 7. Hypothyroidism - continue Synthroid.  Code Status:  full code.  Family Communication:  none.  Disposition Plan:  admit to inpatient.     Kellene Mccleary N. [facility] Pager [phone].  If 7PM-7AM, please contact night-coverage [email or web address] Password [email or web address] 02/09/2014, 12:14 AM

## 2014-02-10 MED ORDER — EZETIMIBE 10 MG PO TABS: 10.0000 mg | ORAL_TABLET | Freq: Every day | ORAL | Status: DC

## 2014-02-10 MED ORDER — ROSUVASTATIN CALCIUM 5 MG PO TABS: 5.0000 mg | ORAL_TABLET | Freq: Every day | ORAL | Status: DC

## 2014-02-10 MED ORDER — CLOPIDOGREL BISULFATE 75 MG PO TABS: 75.0000 mg | ORAL_TABLET | Freq: Every day | ORAL | Status: DC

## 2014-02-10 MED ORDER — ATORVASTATIN CALCIUM 10 MG PO TABS: 10.0000 mg | ORAL_TABLET | Freq: Every day | ORAL | Status: DC

## 2014-02-10 NOTE — Progress Notes (Signed)
Physical Therapy Evaluation Patient Details Name: Jodi Frazier MRN: [id number] DOB: 04/09/1931 Today's Date: 02/10/2014   History of Present Illness  80 y.o. female with history of CAD status post stenting, COPD, lung cancer in remission, ulcerative colitis presents to the ER because of worsening dizziness. Pt with chronic numbness in RUE. MRI revealed infarct in right cerebellum.  Clinical Impression  Pt subjectively stated she had been experiencing vertigo before this admission which is generally continuous in nature though she is currently on Meclizine which helps. Pt tested neg for L and R horizontal canal BPPV and L and R posterior canal BPPV. Pt does become symptomatic with vertical VOR more so than horizontal VOR and given exercises for both to strengthen her gaze stability. PT recommends outpatient vestibular rehab in order to increase pt's balance and safety at home.    Follow Up Recommendations Outpatient PT (Vestibular)    Equipment Recommendations  None recommended by PT       Precautions / Restrictions Precautions Precautions: Fall Restrictions Weight Bearing Restrictions: No      Mobility  Bed Mobility Overal bed mobility: Modified Independent             General bed mobility comments: Mod I as used railings and cued for roll test. Able to go supine to sit without assist.  Transfers Overall transfer level: Needs assistance Equipment used: Rolling walker (2 wheeled) Transfers: Sit to/from Stand Sit to Stand: Supervision         General transfer comment: Pt does not require cueing and transfers from sit to stand with supervision for pt safety.  Ambulation/Gait Ambulation/Gait assistance: Supervision;Min guard Ambulation Distance (Feet): 160 Feet Assistive device: Rolling walker (2 wheeled) Gait Pattern/deviations: WFL(Within Functional Limits);Step-through pattern     General Gait Details: Min guard/supervision for pt safety as c/o dizziness.      Balance Overall balance assessment: Needs assistance Sitting-balance support: Feet unsupported;Single extremity supported Sitting balance-Leahy Scale: Good Sitting balance - Comments: Feet unsupported and single UE supported during MMT. Able to scoot EOB without assistance.   Standing balance support: Bilateral upper extremity supported Standing balance-Leahy Scale: Poor Standing balance comment: Pt used bilateral UE support of RW to amb.                             Pertinent Vitals/Pain Pain Assessment: No/denies pain    Home Living Family/patient expects to be discharged to:: Private residence Living Arrangements: Alone Available Help at Discharge: Family;Other (Comment) (Daughter) Type of Home: Other(Comment) (Condo) Home Access: Stairs to enter   [employer] of Steps: 1   Home Equipment: Shower seat - built in;Walker - 4 wheels Additional Comments: Pt educated to continue walker use at home for safety.    Prior Function Level of Independence: Needs assistance   Gait / Transfers Assistance Needed: recently using walker              Extremity/Trunk Assessment   Upper Extremity Assessment: Defer to OT evaluation           Lower Extremity Assessment: Overall WFL for tasks assessed (Subjectively weaker on L, both 4/5 when tested)         Communication   Communication: No difficulties  Cognition Arousal/Alertness: Awake/alert Behavior During Therapy: WFL for tasks assessed/performed Overall Cognitive Status: No family/caregiver present to determine baseline cognitive functioning (Very talkative cues to stay on task)  General Comments General comments (skin integrity, edema, etc.): Pt has felt dizzy constantly for the past 3-4 months and fell several times before: first 3 were stumbles, 2x passed out and might have hit head. She describes this feeling as "seeing flowers on the ceiling" and having to "grab the  mattress to hold on for dear life." Seen MDs before and on Meclizine for vertigo. Pt has had hearing issues in the past and the wax in her ears cleaned out at the chiropractor's which she stated did not help. Pt does not have ringing in either ear and feels she has equal hearing loss in both ears. Pt stated she had "drips" (possible congestion). Pt wears glasses for reading and had cataracts and macular degeneration. Pt is able to converge though skips during horizontal gaze testing. Pt tested negative for L and R horizontal roll test and L and R modified [place]. Pt symptomatic more so for vertical VOR than horizontal VOR and given handout of exercises for both.    Exercises Other Exercises (Given handout) Other Exercises: Vertical VOR- 1 minute 3x per day Other Exercises: Horizontal VOR- 1 minute 3x per day      Assessment/Plan    PT Assessment Patient needs continued PT services  PT Diagnosis Other (comment);Difficulty walking;Generalized weakness (Dizziness)   PT Problem List Decreased strength;Decreased range of motion;Decreased activity tolerance;Decreased balance;Decreased mobility;Decreased coordination;Decreased knowledge of use of DME;Impaired sensation;Other (comment) (Dizziness)  PT Treatment Interventions DME instruction;Gait training;Stair training;Functional mobility training;Therapeutic activities;Therapeutic exercise;Balance training;Modalities   PT Goals (Current goals can be found in the Care Plan section) Acute Rehab PT Goals PT Goal Formulation: With patient Time For Goal Achievement: 02/24/14 Potential to Achieve Goals: Good    Frequency Min 4X/week    End of Session Equipment Utilized During Treatment: Gait belt Activity Tolerance: Patient tolerated treatment well;Patient limited by pain (Dizziness) Patient left: in chair;with call bell/phone within reach;with chair alarm set Nurse Communication: Mobility status         Time: 0539-7673 PT Time  Calculation (min): 48 min   Charges:    1 Ev 1 TA 1 Self-care     PT G CodesEber Jones, SPT [phone]

## 2014-02-10 NOTE — Progress Notes (Signed)
D/C orders received. Pt and daughter educated on d/c instructions and stroke education. Verbalized understanding. Pt handed d/c packet and prescriptions. IV and tele removed.. Pt taken downstairs by staff via wheelchair.

## 2014-02-10 NOTE — Evaluation (Signed)
Speech Language Pathology Evaluation Patient Details Name: Jodi Frazier MRN: [id number] DOB: May 12, 1931 Today's Date: 02/10/2014 Time: 5284-1324 SLP Time Calculation (min): 12 min  Problem List:  Patient Active Problem List   Diagnosis Date Noted  . Stroke 02/09/2014  . Cerebellar stroke 02/08/2014  . Pleural effusion, left 11/16/2013  . Recurrent UTI 10/15/2013  . Malignant neoplasm of upper lobe, bronchus or lung 08/03/2013  . Orthostatic hypotension 06/29/2013  . Malignant pleural effusion 06/28/2013  . Palliative care encounter 06/28/2013  . Anxiety state, unspecified 06/28/2013  . Chest pain 06/26/2013  . Anxiety 06/15/2013  . UTI (urinary tract infection) 06/15/2013  . Nausea alone 06/15/2013  . Encounter to establish care with new doctor 05/10/2013  . HTN (hypertension) 04/25/2013  . Hypothyroidism   . Dementia   . Depression   . GERD (gastroesophageal reflux disease)   . Cough 04/08/2013  . Radiation pneumonitis 03/05/2013  . COPD (chronic obstructive pulmonary disease) 03/05/2013  . Malignant neoplasm of lower lobe, bronchus, or lung 09/18/2012  . Adenocarcinoma of lung    Past Medical History:  Past Medical History  Diagnosis Date  . Hypertension   . Colitis   . Coronary artery disease   . High cholesterol     takes Red Yeast Rice and Fish OIl daily  . Kidney stones   . PONV (postoperative nausea and vomiting)   . MI (myocardial infarction) 2010  . Shortness of breath     with exertion  . Pneumonia     hx of 20+yrs ago  . COPD (chronic obstructive pulmonary disease)     pleurisy or COPD exascerbation > 34yr ago  . Headache(784.0)   . Vertigo     HTN related bc gets up too fast  . Arthritis   . Bruises easily   . History of shingles 51yrs ago    in eye;occasionally gets some residual   . Accessory skin tags     arms/legs  . GERD (gastroesophageal reflux disease)   . Colitis   . Diverticulosis   . Urinary frequency   . Stress incontinence    . History of kidney stones   . Hypothyroidism     takes sYnthroid daily  . Cataract     early stage on right  . Macular degeneration, wet   . Depression     some but takes Amitryptylline nightly  . Right-sided chest wall pain 08/28/12    PRESENTATION - RIGHT SIDED ANTERIOR CHEST PAIN  . Weight loss 08/28/12    10 LB WEIGHT LOSS OVER 3 MONTHS  . Lung mass 08/12/12    CHEST-XRAY/ PET -LOBULAR MASS LLL - 4.1 X 3.6 X 4.2 CM  . Dementia     mild  . S/P radiation therapy  10/05/2012-11/20/2012    Left Lower Lung and hilum / 70 Gy in 35 fractions  . Hypertension   . At risk for fall due to comorbid condition     5 falls - last Early December  . Vertigo   . Orthostatic hypotension   . S/P radiation therapy 2/13, 2/20, 2/23 (2015)    SBRT to Right Lung  . Adenocarcinoma of lung 09/07/12    needle core bx-LLL-adenocarcinoma   Past Surgical History:  Past Surgical History  Procedure Laterality Date  . Hip arthroscopy  6yr ago    right hip-replacement  . Knee arthroscopy      LT  . Total knee arthroplasty  6yrs ago    left  . Appendectomy  29yrs ago  . Abdominal hysterectomy  33yrs ago   . Lithotripsy  2011  . Cataract surgery  2012    left  . Eye surgery  70yrs ago  . Bladder surgery  2001    tacked  . Coronary angioplasty with stent placement  2010    2 in rt coronary artery and 1 in another spot  . Colonoscopy    . Esophagogastroduodenoscopy    . Total knee arthroplasty  09/09/2011    Procedure: TOTAL KNEE ARTHROPLASTY;  Surgeon: Rudean Haskell, MD;  Location: [facility];  Service: Orthopedics;  Laterality: Right;  Right Total Knee Arthroplasty  . Bronchial brush biopsy Left 08/17/12    LLL Bronchial Washing / Brushing and Bronchial Biopsy: Negative for malignancy  . Left lower lobe needle core biopsy Left 09/07/12    Poorly Differentiated Adenocarcinoma   . Joint replacement     HPI:  80 y.o. female with history of CAD status post stenting, COPD, lung cancer in remission,  ulcerative colitis presents to the ER because of worsening dizziness. Pt with chronic numbness in RUE. MRI revealed infarct in right cerebellum.   Assessment / Plan / Recommendation Clinical Impression  Orders received.  Given MRI results of right cerebellum infarct assessment focused on oral motor function and speech intelligibility.  Oral motor exam was unremarkable and conversational speech was fully intelligible. Daughter present and confirmed that patient is at her baseline.  SLP briefly educated patient and daughter regarding stroke s/s as well as need for timely admission following onset; they verbalized understanding of information.  Patient ready for discharge with no further skilled SLP needs at this time.       SLP Assessment  Patient does not need any further Speech Lanaguage Pathology Services    Follow Up Recommendations  None            Pertinent Vitals/Pain Pain Assessment: No/denies pain   SLP Goals   N/A  SLP Evaluation Prior Functioning  Cognitive/Linguistic Baseline: Within functional limits Type of Home: Other(Comment) (Condo) Available Help at Discharge: Family;Other (Comment) (Daughter) Vocation: Retired   Associate Professor  Overall Cognitive Status: Within Functional Limits for tasks assessed Orientation Level: Oriented X4    Comprehension  Auditory Comprehension Overall Auditory Comprehension: Appears within functional limits for tasks assessed    Expression Expression Primary Mode of Expression: Verbal Verbal Expression Overall Verbal Expression: Appears within functional limits for tasks assessed   Oral / Motor Oral Motor/Sensory Function Overall Oral Motor/Sensory Function: Appears within functional limits for tasks assessed Labial ROM: Within Functional Limits Labial Symmetry: Within Functional Limits Lingual ROM: Within Functional Limits Lingual Symmetry: Within Functional Limits Facial ROM: Within Functional Limits Facial Symmetry: Within  Functional Limits Motor Speech Overall Motor Speech: Appears within functional limits for tasks assessed Respiration: Within functional limits Phonation: Normal Resonance: Within functional limits Articulation: Within functional limitis Intelligibility: Intelligible Motor Planning: Witnin functional limits Motor Speech Errors: Not applicable   GO     Carmelia Roller., CCC-SLP [phone]  Elenora Hawbaker 02/10/2014, 1:40 PM

## 2014-02-10 NOTE — Progress Notes (Signed)
Talked to patient about DCP/ Outpatient therapy as recommended by Attending MD; patient requested [facility]; Orders and clinical information fax; talked to Tedra Coupe - her apt for vestibular PT is 8/21, 2015 at 3:15pm/ they will contact her later for date and time for OT; Aneta Mins [phone]

## 2014-02-10 NOTE — Progress Notes (Signed)
Received call that pt does not want Outpatient therapy and is requesting [facility]; talked to daughter who requested [facility]; Mary with [facility] called for arrangements; Mindi Slicker RN,BSN,MHA [phone]

## 2014-02-10 NOTE — Progress Notes (Signed)
Read, reviewed, edited and agree with student's findings and recommendations.  Meyah Corle B. Zyree Traynham, PT, DPT #[phone]  

## 2014-02-10 NOTE — Progress Notes (Addendum)
Occupational Therapy Treatment Patient Details Name: Jodi Frazier MRN: [id number] DOB: Jul 09, 1930 Today's Date: 02/10/2014    History of present illness 80 y.o. female with history of CAD status post stenting, COPD, lung cancer in remission, ulcerative colitis presents to the ER because of worsening dizziness. Pt with chronic numbness in RUE. MRI revealed infarct in right cerebellum.   OT comments  Patient seed today prior to d/c>home. Patient's daughter present during OT session. Educated patient and daughter on safety within house, importance of 24/7 supervision initially once at home, functional mobility/transfers, and importance of energy conservation during tasks. Both interested in [facility]; this therapist agrees. During session, patient performed toilet transfer (supervision), grooming tasks in standing (supervision), functional ambulation using RW(supervision), demonstration of simulated shower stall transfer. During energy conservation education/discussion, patient verbalized that she took multiple seated rest breaks during tasks at home. Patient and daughter eager to d/c>home today.   Follow Up Recommendations  Home health OT;Supervision/Assistance - 24 hour    Equipment Recommendations  None recommended by OT    Recommendations for Other Services  n/a, at this time    Precautions / Restrictions Precautions Precautions: Fall Restrictions Weight Bearing Restrictions: No       Mobility Bed Mobility Overal bed mobility: Modified Independent             General bed mobility comments: Mod I as used railings and cued for roll test. Able to go supine to sit without assist.  Transfers Overall transfer level: Needs assistance Equipment used: Rolling walker (2 wheeled) Transfers: Sit to/from [facility] Sit to Stand: Supervision Stand pivot transfers: Supervision       General transfer comment: Pt does not require cueing and transfers from sit to stand  with supervision for pt safety.    Balance Overall balance assessment: Needs assistance Sitting-balance support: Feet unsupported;Single extremity supported Sitting balance-Leahy Scale: Good Sitting balance - Comments: Feet unsupported and single UE supported during MMT. Able to scoot EOB without assistance.   Standing balance support: Bilateral upper extremity supported Standing balance-Leahy Scale: Poor Standing balance comment:  (Patient used RW for UE support during standing & ambulation)                   ADL Overall ADL's : Needs assistance/impaired     Grooming: Wash/dry hands;Wash/dry face;Oral care;Brushing hair;Standing;Supervision/safety Grooming Details (indicate cue type and reason):  (Patient standing at sink with supervision for grooming tasks)                 Toilet Transfer: Supervision/safety;Ambulation;RW         Tub/Shower Transfer Details (indicate cue type and reason):  (Educated patient on walk-in shower transfers)   General ADL Comments: Focused skilled interention on functional mobility/ambulation/transfers using RW, grooming tasks standing at sink, education to patient and daughter regarding safe & effective functional transfers at home, energy conservation techniques. Patient found seated in recliner, stood with RW and supervision from therapist. Ambulated into bathroom for elevated toilet seat transfer, then stood at sink for grooming tasks. Patient ambulated back to recliner and therapist educated on walk-in shower transfers using RW. Both patient and daughter verbalized understanding, patient demonstrated understanding.        Vision  No apparent visual impairments.                  Cognition   Behavior During Therapy: WFL for tasks assessed/performed Overall Cognitive Status: Within Functional Limits for tasks assessed  Extremity/Trunk Assessment  Upper Extremity Assessment Upper Extremity  Assessment: Defer to OT evaluation   Lower Extremity Assessment Lower Extremity Assessment: Overall WFL for tasks assessed (Subjectively weaker on L, both 4/5 when tested)                   Pertinent Vitals/ Pain       Pain Assessment: No/denies pain  Home Living Family/patient expects to be discharged to:: Private residence Living Arrangements: Alone Available Help at Discharge: Family;Other (Comment) (Daughter) Type of Home: Other(Comment) (Condo) Home Access: Stairs to enter [employer] of Steps: 1                   Home Equipment: Shower seat - built in;Walker - 4 wheels   Additional Comments: Pt educated to continue walker use at home for safety.      Prior Functioning/Environment Level of Independence: Needs assistance  Gait / Transfers Assistance Needed: recently using walker         Frequency Min 2X/week     Progress Toward Goals  OT Goals(current goals can now be found in the care plan section)  Progress towards OT goals: Progressing toward goals  Acute Rehab OT Goals Patient Stated Goal: not stated OT Goal Formulation: With patient/family  Plan Discharge plan remains appropriate       End of Session Equipment Utilized During Treatment: Gait belt;Rolling walker   Activity Tolerance Patient tolerated treatment well   Patient Left in chair;with call bell/phone within reach;with family/visitor present     Time: 1207-1230 OT Time Calculation (min): 23 min  Charges: OT General Charges $OT Visit: 1 Procedure OT Treatments $Self Care/Home Management : 23-37 mins (23)  Verity Gilcrest , MS, OTR/L, CLT  02/10/2014, 1:28 PM

## 2014-02-11 NOTE — Progress Notes (Signed)
Stroke Team Progress Note  HISTORY Mikka A Sittner is an 80 y.o. female with a history of vertigo, hypertension, hyperlipidemia, COPD, myocardial infarction and adenocarcinoma of the lung, presenting following an episode of severe sudden vertigo at about 10:00 this morning with persistence of symptoms exacerbated by movement. CT scan of the head showed no acute intracranial abnormality. MRI study showed 5 mm acute right cerebellar ischemic infarction. MRA was unremarkable except for atherosclerotic changes. Patient has no previous history of stroke nor TIA. She's been taking aspirin 81 mg per day. NIH stroke score was 1 for numbness involving right upper extremity which is chronic and related to previous neck injury.  LSN: 8 AM on 02/08/2014  tPA Given: No: Minimal deficits, and beyond time window for treatment consideration.  MRankin: 1 . She was admitted to the neuro floor bed for further evaluation and treatment.  SUBJECTIVE Her daughter is not at the bedside.  Overall she feels her condition is gradually improving. She still has some dizziness and gait ataxia which is not improved back to baseline.   OBJECTIVE Most recent Vital Signs: Filed Vitals:   02/10/14 0126 02/10/14 0514 02/10/14 0928 02/10/14 0943  BP: 140/64 135/56  140/61  Pulse: 64 64  61  Temp: 98 F (36.7 C) 97.6 F (36.4 C)  98.1 F (36.7 C)  TempSrc: Oral Oral  Oral  Resp: 20 20  20   Height:      Weight:      SpO2: 98% 96% 98% 97%   CBG (last 3)  No results found for this basename: GLUCAP,  in the last 72 hours  IV Fluid Intake:    MEDICATIONS   PRN:      Diet:      Activity:  Bedrest    DVT Prophylaxis:  SCDs  CLINICALLY SIGNIFICANT STUDIES Basic Metabolic Panel:   Recent Labs Lab 02/08/14 1704 02/09/14 0550  NA 140 141  K 4.7 4.5  CL 106 105  CO2 23 24  GLUCOSE 103* 80  BUN 13 13  CREATININE 0.91 0.98  CALCIUM 9.7 9.9   Liver Function Tests:   Recent Labs Lab 02/08/14 1704  02/09/14 0550  AST 18 16  ALT 15 14  ALKPHOS 156* 143*  BILITOT <0.2* 0.3  PROT 6.9 6.4  ALBUMIN 3.3* 3.1*   CBC:   Recent Labs Lab 02/08/14 1704 02/09/14 0550  WBC 6.8 7.1  NEUTROABS 4.2 4.4  HGB 12.1 12.4  HCT 39.0 39.8  MCV 82.6 84.3  PLT 296 294   Coagulation: No results found for this basename: LABPROT, INR,  in the last 168 hours Cardiac Enzymes:   Recent Labs Lab 02/09/14 0550 02/09/14 1200 02/09/14 1758  TROPONINI <0.30 <0.30 <0.30   Urinalysis: No results found for this basename: COLORURINE, APPERANCEUR, LABSPEC, PHURINE, GLUCOSEU, HGBUR, BILIRUBINUR, KETONESUR, PROTEINUR, UROBILINOGEN, NITRITE, LEUKOCYTESUR,  in the last 168 hours Lipid Panel    Component Value Date/Time   CHOL 210* 02/09/2014 0550   TRIG 186* 02/09/2014 0550   HDL 39* 02/09/2014 0550   CHOLHDL 5.4 02/09/2014 0550   VLDL 37 02/09/2014 0550   LDLCALC 134* 02/09/2014 0550   HgbA1C  Lab Results  Component Value Date   HGBA1C 5.8* 02/09/2014    Urine Drug Screen:   No results found for this basename: labopia,  cocainscrnur,  labbenz,  amphetmu,  thcu,  labbarb    Alcohol Level: No results found for this basename: ETH,  in the last 168 hours  No results found.  MRI of the brain  see above  MRA of the brain  See above  Carotid Doppler  1-39% internal carotid artery stenosis bilaterally. Vertebral arteries are patent with antegrade flow.      2D Echocardiogram  Left ventricle: The cavity size was normal. There was mild concentric hypertrophy. Systolic function was vigorous. The estimated ejection fraction was in the range of 65% to 70%. Wall motion was normal; there were no regional wall motion abnormalities.  CXR  Similar appearance of bilateral radiation changes superimposed upon  COPD/chronic bronchitis. No convincing evidence of acute  superimposed process.    EKG   Sinus rhythm Borderline right axis deviation Nonspecific T abnrm, anterolateral leads Therapy  Recommendations  pending  Physical Exam  pleasant elderly Caucasian lady currently not in distress.Awake alert. Afebrile. Head is nontraumatic. Neck is supple without bruit. Hearing is normal. Cardiac exam no murmur or gallop. Lungs are clear to auscultation. Distal pulses are well felt. Neurological Exam ;  Awake  Alert oriented x 3. Normal speech and language.eye movements full without nystagmus.fundi were not visualized. Vision acuity and fields appear normal. Hearing is normal. Palatal movements are normal. Face symmetric. Tongue midline. Normal strength, tone, reflexes and coordination. Normal sensation. Gait deferred. ASSESSMENT Ms. YORLEY BUCH is a 80 y.o. female presenting with vertigo, dizziness and gait ataxia. Secondary to small right cerebellar infarct etiology will likely small vessel disease  On aspirin 81 mg orally every day prior to admission. Now on aspirin 325 mg orally every day for secondary stroke prevention. Patient with resultant  No deficits. Stroke work up underway.   Hypertension- on amlodipine and carvedilol PTA  LDL 134 mg% not on statin PTA  CAD   Hospital day # 2  TREATMENT/PLAN  Change to   clopidogrel 75 mg orally every day for secondary stroke prevention.  Dc home with outpatient therapies  Follow up as outpatient in 4-6 weeks   Stroke team will sign off   Antony Contras, MD       To contact Stroke Continuity provider, please refer to [email or web address] After hours, contact General Neurology

## 2014-02-11 NOTE — Discharge Summary (Signed)
Physician Discharge Summary  Jodi Frazier RJJ:[id number] DOB: December 12, 1930 DOA: 02/08/2014  PCP: Jodi Rio, PA-C  Admit date: 02/08/2014 Discharge date: 02/11/2014  Time spent: 35 minutes  Recommendations for Outpatient Follow-up:  1. Follow up with PCP in 1-2 weeks 2. Follow up with ENT in 2 weeks 3. Follow up with Dr. Leonie Man in 2 months 4. HHPT/OT and vestibular rehab has been arranged by CM  Discharge Diagnoses:  Active Problems:   COPD (chronic obstructive pulmonary disease)   HTN (hypertension)   Hypothyroidism   Dementia   Cerebellar stroke   Stroke  Discharge Condition: stable  Diet recommendation: heart healthy  Filed Weights   02/08/14 1625 02/09/14 0130  Weight: 82.555 kg (182 lb) 81.194 kg (179 lb)   History of present illness:  Jodi Frazier is a 80 y.o. female with history of CAD status post stenting, COPD, lung cancer in remission, ulcerative colitis presents to the ER because of worsening dizziness. Patient states that over the last 3 days patient has been having increasing dizziness a feeling of sensation of imbalance. Patient today had worsening of her symptoms and also has abdominal discomfort with 3 bowel movements which are loose and dark. She had called her PCP and was told to come to the ER. In the ER MRI brain shows acute cerebellar stroke. On-call neurologist was consulted and patient is admitted for further management. Patient states that she initially had some epigastric discomfort but at this time the pain has resolved. She has not had any further bowel movements. Patient has been on antibiotics for last few weeks for recurrent urinary tract infection. Denies any chest pain palpitations shortness of breath nausea vomiting. Denies any weakness of the upper extremities though she did loose grip on objects. Patient has chronic numbness of her right upper extremity. Denies any visual symptoms. Denies any difficulty speaking or swallowing.    Hospital Course:  Patient presented to the emergency room with dizziness, and was admitted to the neurology floor for consent for a CVA. She underwent a CT scan of the head without contrast which was negative for acute intracranial findings, and this was followed by an MRI which showed a 5 mm acute right cerebellar infarct. Neurology was consulted on patient's admission and the stroke team has followed patient while hospitalized. She underwent workup for stroke which included a 2-D echocardiography which showed normal ejection fraction of 65-70% without wall motion abnormalities, and grade 1 diastolic dysfunction. Check carotid Dopplers done which showed 1-39% ICA stenosis bilaterally. Hemoglobin A1c was 5.8, and a lipid panel which showed an elevated LDL of 134. Her aspirin was discontinued per neurology and she was started on Plavix 75 mg daily. She was also started on Crestor 5 mg daily, as well as Zetia per their preference. She has a history of not tolerating statins and is concerned about her ability to take Crestor for long-term. Her dizziness is improved, however has not fully resolved. She was evaluated by ENT in the past, but could not undergo formal vestibular testing because she could not stop all of her medications as requested by ENT, and she plans to see ENT again to complete her workup. Meclizine works well for her and she will continue that on discharge. Her other medical problems have been stable during this hospitalization and no other medication changes were made. She is to followup with her PCP, ENT as well as neurology as an outpatient. Patient was deemed stable for discharge, she was evaluated by  PT/OT/speech pathology and home health PT/OT/vestibular were recommended and ordered prior to discharge, case management assisted with post discharge arrangements.  Procedures:  Carotid doppler Findings suggest 1-39% internal carotid artery stenosis bilaterally. Vertebral arteries are patent  with antegrade flow.   2D echo - Left ventricle: The cavity size was normal. There was mild concentric hypertrophy. Systolic function was vigorous. The estimated ejection fraction was in the range of 65% to 70%. Wall motion was normal; there were no regional wall motion abnormalities. Doppler parameters are consistent with abnormal left ventricular relaxation (grade 1 diastolic dysfunction). - Aortic valve: Trileaflet; normal thickness leaflets. There was no regurgitation. - Aortic root: The aortic root was normal in size. - Mitral valve: Calcified annulus. There was no regurgitation. - Right ventricle: Systolic function was normal. - Right atrium: The atrium was normal in size. - Pulmonary arteries: Systolic pressure can&'t be assessed on current study. - Inferior vena cava: The vessel was normal in size. - Pericardium, extracardiac: There was no pericardial effusion.  Consultations:  Neurology   Discharge Exam: Filed Vitals:   02/10/14 0126 02/10/14 0514 02/10/14 0928 02/10/14 0943  BP: 140/64 135/56  140/61  Pulse: 64 64  61  Temp: 98 F (36.7 C) 97.6 F (36.4 C)  98.1 F (36.7 C)  TempSrc: Oral Oral  Oral  Resp: 20 20  20   Height:      Weight:      SpO2: 98% 96% 98% 97%   General: NAD Cardiovascular: RRR Respiratory: CTA biL  Discharge Instructions     Medication List    STOP taking these medications       aspirin EC 81 MG tablet      TAKE these medications       acetaminophen 325 MG tablet  Commonly known as:  TYLENOL  Take 650 mg by mouth every 6 (six) hours as needed (pain).     albuterol 108 (90 BASE) MCG/ACT inhaler  Commonly known as:  PROVENTIL HFA;VENTOLIN HFA  Inhale 2 puffs into the lungs every 6 (six) hours as needed for wheezing.     ALPRAZolam 0.25 MG tablet  Commonly known as:  XANAX  Take 1 tablet (0.25 mg total) by mouth 2 (two) times daily as needed for anxiety.     amitriptyline 25 MG tablet  Commonly known as:  ELAVIL  Take 1 tablet (25  mg total) by mouth at bedtime.     amLODipine 2.5 MG tablet  Commonly known as:  NORVASC  Take 1 tablet (2.5 mg total) by mouth daily.     BIOTIN PO  Take 1 tablet by mouth daily.     carvedilol 6.25 MG tablet  Commonly known as:  COREG  Take 6.25 mg by mouth 2 (two) times daily with a meal.     cephALEXin 500 MG capsule  Commonly known as:  KEFLEX  Take 500 mg by mouth at bedtime. Take for over 30 days patient states she is on her second course of medication due to kidney infection. Started second course of medication on 01-29-14.     clopidogrel 75 MG tablet  Commonly known as:  PLAVIX  Take 1 tablet (75 mg total) by mouth daily.     cyanocobalamin 1000 MCG/ML injection  Commonly known as:  (VITAMIN B-12)  Inject 1,000 mcg into the muscle every 30 (thirty) days. Last dose was on 01-29-14     cycloSPORINE 0.05 % ophthalmic emulsion  Commonly known as:  RESTASIS  Place 1 drop into  both eyes 2 (two) times daily.     donepezil 10 MG tablet  Commonly known as:  ARICEPT  Take 1.5 tablets (15 mg total) by mouth at bedtime. Takes 1 and 1/2     escitalopram 10 MG tablet  Commonly known as:  LEXAPRO  Take 1 tablet (10 mg total) by mouth daily.     ezetimibe 10 MG tablet  Commonly known as:  ZETIA  Take 1 tablet (10 mg total) by mouth daily.     Fluticasone Furoate-Vilanterol 100-25 MCG/INH Aepb  Commonly known as:  BREO ELLIPTA  Inhale 1 puff into the lungs daily.     levothyroxine 125 MCG tablet  Commonly known as:  SYNTHROID  Take 1 tablet (125 mcg total) by mouth daily before breakfast.     meclizine 50 MG tablet  Commonly known as:  ANTIVERT  Take 0.5 tablets (25 mg total) by mouth 3 (three) times daily.     mesalamine 1.2 G EC tablet  Commonly known as:  LIALDA  Take 1.2 g by mouth 3 (three) times daily.     midodrine 2.5 MG tablet  Commonly known as:  PROAMATINE  Take 1 tablet (2.5 mg total) by mouth 2 (two) times daily with a meal.     naproxen sodium 220 MG  tablet  Commonly known as:  ANAPROX  Take 220 mg by mouth daily as needed.     nitroGLYCERIN 0.4 MG SL tablet  Commonly known as:  NITROSTAT  Place 0.4 mg under the tongue every 5 (five) minutes as needed for chest pain.     pantoprazole 40 MG tablet  Commonly known as:  PROTONIX  Take 40 mg by mouth daily.     rosuvastatin 5 MG tablet  Commonly known as:  CRESTOR  Take 1 tablet (5 mg total) by mouth daily at 6 PM.           Follow-up Information   Follow up with Jodi Rio, PA-C. Schedule an appointment as soon as possible for a visit in 2 weeks.   Specialty:  Physician Assistant   Contact information:   [address] [place] [id number] [phone]       Follow up with SETHI,PRAMOD, MD. Schedule an appointment as soon as possible for a visit in 2 months.   Specialties:  Neurology, Radiology   Contact information:   [address] [place] [address] [phone]       Follow up with [facility]. (they will call you for a start up date and time)    Contact information:   [address] [place] [phone]       The results of significant diagnostics from this hospitalization (including imaging, microbiology, ancillary and laboratory) are listed below for reference.    Significant Diagnostic Studies: Dg Chest 2 View  02/08/2014   CLINICAL DATA:  Chest pain.  Lung cancer.  Hypertension.  Ex-smoker.  EXAM: CHEST  2 VIEW  COMPARISON:  01/06/2014 CT and plain films of 11/23/2013  FINDINGS: Hyperinflation/COPD. Lateral view degraded by patient arm position. Midline trachea. Normal heart size with atherosclerosis in the transverse aorta. Left-sided pleural thickening, blunting the left costophrenic angle. No pleural fluid. Biapical pleural thickening. Radiation changes within the left suprahilar region with resultant hilar retraction superiorly. Right apical pleural parenchymal opacity is grossly  similar and also likely related to radiation change. No new lobar consolidation.  IMPRESSION: Similar appearance of bilateral  radiation changes superimposed upon COPD/chronic bronchitis. No convincing evidence of acute superimposed process.   Electronically Signed   By: Abigail Miyamoto M.D.   On: 02/08/2014 18:23   Ct Head Wo Contrast  02/08/2014   CLINICAL DATA:  Dizziness.  Nausea.  Vertigo.  EXAM: CT HEAD WITHOUT CONTRAST  TECHNIQUE: Contiguous axial images were obtained from the base of the skull through the vertex without intravenous contrast.  COMPARISON:  05/12/2013  FINDINGS: There is no evidence of intracranial hemorrhage, brain edema, or other signs of acute infarction. There is no evidence of intracranial mass lesion or mass effect. No abnormal extraaxial fluid collections are identified.  Mild to moderate diffuse cerebral atrophy appears stable. Ventricles are normal in size. No skull fracture or other bone lesion identified.  IMPRESSION: No acute intracranial findings.  Stable cerebral atrophy.   Electronically Signed   By: Earle Gell M.D.   On: 02/08/2014 18:26   Mr Jodene Nam Head Wo Contrast  02/08/2014   CLINICAL DATA:  Dizziness and abnormal neurologic exam. Rule out stroke. History of lung cancer.  EXAM: MRI HEAD WITHOUT CONTRAST  MRA HEAD WITHOUT CONTRAST  TECHNIQUE: Multiplanar, multiecho pulse sequences of the brain and surrounding structures were obtained without intravenous contrast. Angiographic images of the head were obtained using MRA technique without contrast.  COMPARISON:  Head CT 02/08/2014 and MRI 06/11/2013. Head MRA 07/13/2007.  FINDINGS: MRI HEAD FINDINGS  There is a 5 mm acute infarct in the right cerebellar hemisphere. There is no intracranial hemorrhage, mass, midline shift, or extra-axial fluid collection. Small foci of T2 hyperintensity in the subcortical and periventricular white matter are unchanged and nonspecific but may reflect minimal chronic small vessel ischemic disease.  There is mild to moderate generalized cerebral atrophy.  Prior left cataract extraction is noted. Paranasal sinuses and mastoid air cells are clear. Major intracranial vascular flow voids are preserved.  MRA HEAD FINDINGS  Images are mildly to moderately degraded by motion. Visualized distal vertebral arteries appear patent, with moderate irregular narrowing of the right vertebral artery, particularly distal to the PICA origin, new from prior. Left vertebral artery is dominant. PICA origins are patent. SCA origins are patent. Basilar artery is patent without stenosis. Irregular narrowing of the proximal PCAs is moderate on the right and mild on the left and appears to have mildly progressed from the prior study on the right side. There is moderate to prominent branch vessel irregularity and attenuation bilaterally.  Internal carotid arteries are patent from skullbase to carotid termini. Mild bilateral carotid siphon irregularity is noted without significant stenosis. The right A1 segment is again seen to be absent, with the right PCA supplied by the left via the anterior communicating artery. No significant proximal MCA stenosis is identified, however there is mild-to-moderate bilateral MCA branch vessel irregularity. No intracranial aneurysm is identified.  IMPRESSION: 1. 5 mm acute right cerebellar infarct. 2. No evidence of major intracranial arterial occlusion. 3. Intracranial atherosclerosis appears to have increased from the prior MRA, although findings may be accentuated by motion artifact on the current examination. There is moderate irregular narrowing of the nondominant right vertebral artery as well as of the right greater than left proximal PCAs.   Electronically Signed   By: Logan Bores   On: 02/08/2014 21:58   Mr Brain Wo Contrast  02/08/2014   CLINICAL DATA:  Dizziness and abnormal neurologic exam. Rule out stroke. History of lung cancer.  EXAM: MRI HEAD WITHOUT CONTRAST  MRA HEAD WITHOUT CONTRAST  TECHNIQUE: Multiplanar, multiecho pulse sequences of the brain and surrounding structures were obtained without intravenous contrast. Angiographic images of the head were obtained using MRA technique without contrast.  COMPARISON:  Head CT 02/08/2014 and MRI 06/11/2013. Head MRA 07/13/2007.  FINDINGS: MRI HEAD FINDINGS  There is a 5 mm acute infarct in the right cerebellar hemisphere. There is no intracranial hemorrhage, mass, midline shift, or extra-axial fluid collection. Small foci of T2 hyperintensity in the subcortical and periventricular white matter are unchanged and nonspecific but may reflect minimal chronic small vessel ischemic disease. There is mild to moderate generalized cerebral atrophy.  Prior left cataract extraction is noted. Paranasal sinuses and mastoid air cells are clear. Major intracranial vascular flow voids are preserved.  MRA HEAD FINDINGS  Images are mildly to moderately degraded by motion. Visualized distal vertebral arteries appear patent, with moderate irregular narrowing of the right vertebral artery, particularly distal to the PICA origin, new from prior. Left vertebral artery is dominant. PICA origins are patent. SCA origins are patent. Basilar artery is patent without stenosis. Irregular narrowing of the proximal PCAs is moderate on the right and mild on the left and appears to have mildly progressed from the prior study on the right side. There is moderate to prominent branch vessel irregularity and attenuation bilaterally.  Internal carotid arteries are patent from skullbase to carotid termini. Mild bilateral carotid siphon irregularity is noted without significant stenosis. The right A1 segment is again seen to be absent, with the right PCA supplied by the left via the anterior communicating artery. No significant proximal MCA stenosis is identified, however there is mild-to-moderate bilateral MCA branch vessel irregularity. No intracranial aneurysm is identified.  IMPRESSION:  1. 5 mm acute right cerebellar infarct. 2. No evidence of major intracranial arterial occlusion. 3. Intracranial atherosclerosis appears to have increased from the prior MRA, although findings may be accentuated by motion artifact on the current examination. There is moderate irregular narrowing of the nondominant right vertebral artery as well as of the right greater than left proximal PCAs.   Electronically Signed   By: Logan Bores   On: 02/08/2014 21:58   Labs: Basic Metabolic Panel:  Recent Labs Lab 02/08/14 1704 02/09/14 0550  NA 140 141  K 4.7 4.5  CL 106 105  CO2 23 24  GLUCOSE 103* 80  BUN 13 13  CREATININE 0.91 0.98  CALCIUM 9.7 9.9   Liver Function Tests:  Recent Labs Lab 02/08/14 1704 02/09/14 0550  AST 18 16  ALT 15 14  ALKPHOS 156* 143*  BILITOT <0.2* 0.3  PROT 6.9 6.4  ALBUMIN 3.3* 3.1*   CBC:  Recent Labs Lab 02/08/14 1704 02/09/14 0550  WBC 6.8 7.1  NEUTROABS 4.2 4.4  HGB 12.1 12.4  HCT 39.0 39.8  MCV 82.6 84.3  PLT 296 294   Cardiac Enzymes:  Recent Labs Lab 02/08/14 1704 02/09/14 0550 02/09/14 1200 02/09/14 1758  TROPONINI <0.30 <0.30 <0.30 <0.30   BNP: BNP (last 3 results)  Recent Labs  05/10/13 1707 06/26/13 1632 02/08/14 1704  PROBNP 267.10 155.8 85.4   Signed:  GHERGHE, COSTIN  [facility] 02/11/2014, 4:00 PM

## 2014-02-15 ENCOUNTER — Other Ambulatory Visit: Payer: Self-pay | Admitting: Physician Assistant

## 2014-02-15 ENCOUNTER — Telehealth: Payer: Self-pay

## 2014-02-15 NOTE — Telephone Encounter (Signed)
Joe (PT from [facility]) stated that" he seen pt yesterday for Home Health PT evaluation. Will be providing Pt 2 times a week for 7 weeks for balance, independence at home ambulation. If there's no questions will fax [phone] plan of care."LDM

## 2014-02-15 NOTE — Telephone Encounter (Signed)
Left message for Jodi Frazier on voice mail. LDM

## 2014-02-15 NOTE — Telephone Encounter (Signed)
There are no questions.  Have him fax over their plan of care for signature.

## 2014-02-22 ENCOUNTER — Telehealth: Payer: Self-pay

## 2014-02-22 NOTE — Telephone Encounter (Signed)
Jodi Frazier w/[facility] left a message stating that she would like to proceed with therapy for 3 more weeks at 2 times a week for occupational therapy for ADL's and safety?  Please advise?

## 2014-02-22 NOTE — Telephone Encounter (Signed)
Verbal order granted.

## 2014-02-23 NOTE — Telephone Encounter (Signed)
Spoke with Jodi Frazier at [facility] and gave provider VO to continue therapy/SLS

## 2014-02-24 ENCOUNTER — Ambulatory Visit (INDEPENDENT_AMBULATORY_CARE_PROVIDER_SITE_OTHER): Payer: Medicare Other | Admitting: Physician Assistant

## 2014-02-24 ENCOUNTER — Encounter: Payer: Self-pay | Admitting: Physician Assistant

## 2014-02-24 VITALS — BP 131/64 | HR 56 | Temp 97.5°F | Resp 16 | Ht 71.0 in | Wt 181.0 lb

## 2014-02-24 DIAGNOSIS — H8110 Benign paroxysmal vertigo, unspecified ear: Secondary | ICD-10-CM

## 2014-02-24 DIAGNOSIS — I6350 Cerebral infarction due to unspecified occlusion or stenosis of unspecified cerebral artery: Secondary | ICD-10-CM

## 2014-02-24 DIAGNOSIS — I639 Cerebral infarction, unspecified: Secondary | ICD-10-CM

## 2014-02-24 NOTE — Progress Notes (Signed)
Patient presents to clinic today for hospital follow-up.  Patient presented to the ER due to worsening dizziness associated with dark stools. ER workup included MRI which revealed an acute cerebellar stroke.  Patient was admitted for Neurology consult and management. Echo performed revealing Grade 1 Diastolic dysfunction with preserved EF of 65-70%.  Carotid dopplers with 1-39% grade occlusion, not concerning for cause of present symptom.  ASA was discontinued and patient started on Plavix.  Patient restarted on Crestor and then placed on Zetia for elevated LDL. Patient was deemed stable for discharge and was discharge with PT/OT/Speech Pathology and Home Health. Is not having Vestibular Rehabilitation at present.   Patient states she has been doing well since discharge.  Denies lightheadedness, worsening dizziness, chest pain, SOB, AMS, facial drooping or change to speech or vision.  Is enjoying having the help of the Home Health nurse. Patient would like a referral to a new ENT practice for further testing.  Needs to be set up for Vestibular Rehab as this was not done in the hospital.  Patient has upcoming appointment with Neurology.  No other concerns at present.  Past Medical History  Diagnosis Date  . Hypertension   . Colitis   . Coronary artery disease   . High cholesterol     takes Red Yeast Rice and Fish OIl daily  . Kidney stones   . PONV (postoperative nausea and vomiting)   . MI (myocardial infarction) 2010  . Shortness of breath     with exertion  . Pneumonia     hx of 20+yrs ago  . COPD (chronic obstructive pulmonary disease)     pleurisy or COPD exascerbation > 78yrago  . Headache(784.0)   . Vertigo     HTN related bc gets up too fast  . Arthritis   . Bruises easily   . History of shingles 158yrago    in eye;occasionally gets some residual   . Accessory skin tags     arms/legs  . GERD (gastroesophageal reflux disease)   . Colitis   . Diverticulosis   . Urinary  frequency   . Stress incontinence   . History of kidney stones   . Hypothyroidism     takes sYnthroid daily  . Cataract     early stage on right  . Macular degeneration, wet   . Depression     some but takes Amitryptylline nightly  . Right-sided chest wall pain 08/28/12    PRESENTATION - RIGHT SIDED ANTERIOR CHEST PAIN  . Weight loss 08/28/12    10 LB WEIGHT LOSS OVER 3 MONTHS  . Lung mass 08/12/12    CHEST-XRAY/ PET -LOBULAR MASS LLL - 4.1 X 3.6 X 4.2 CM  . Dementia     mild  . S/P radiation therapy  10/05/2012-11/20/2012    Left Lower Lung and hilum / 70 Gy in 35 fractions  . Hypertension   . At risk for fall due to comorbid condition     5 falls - last Early December  . Vertigo   . Orthostatic hypotension   . S/P radiation therapy 2/13, 2/20, 2/23 (2015)    SBRT to Right Lung  . Adenocarcinoma of lung 09/07/12    needle core bx-LLL-adenocarcinoma    Current Outpatient Prescriptions on File Prior to Visit  Medication Sig Dispense Refill  . acetaminophen (TYLENOL) 325 MG tablet Take 650 mg by mouth every 6 (six) hours as needed (pain).       . Marland Kitchenlbuterol (PROVENTIL  HFA;VENTOLIN HFA) 108 (90 BASE) MCG/ACT inhaler Inhale 2 puffs into the lungs every 6 (six) hours as needed for wheezing.  1 Inhaler  3  . ALPRAZolam (XANAX) 0.25 MG tablet Take 1 tablet (0.25 mg total) by mouth 2 (two) times daily as needed for anxiety.  30 tablet  0  . amitriptyline (ELAVIL) 25 MG tablet Take 1 tablet (25 mg total) by mouth at bedtime.  90 tablet  0  . amLODipine (NORVASC) 2.5 MG tablet Take 1 tablet (2.5 mg total) by mouth daily.  90 tablet  0  . BIOTIN PO Take 1 tablet by mouth daily.       . carvedilol (COREG) 6.25 MG tablet Take 6.25 mg by mouth 2 (two) times daily with a meal.      . clopidogrel (PLAVIX) 75 MG tablet Take 1 tablet (75 mg total) by mouth daily.  90 tablet  1  . cyanocobalamin (,VITAMIN B-12,) 1000 MCG/ML injection Inject 1,000 mcg into the muscle every 30 (thirty) days. Last dose  was on 01-29-14      . cycloSPORINE (RESTASIS) 0.05 % ophthalmic emulsion Place 1 drop into both eyes 2 (two) times daily.  0.4 mL  0  . donepezil (ARICEPT) 10 MG tablet Take 1.5 tablets (15 mg total) by mouth at bedtime. Takes 1 and 1/2  135 tablet  0  . escitalopram (LEXAPRO) 10 MG tablet TAKE 1 TABLET BY MOUTH DAILY  90 tablet  1  . ezetimibe (ZETIA) 10 MG tablet Take 1 tablet (10 mg total) by mouth daily.  90 tablet  1  . Fluticasone Furoate-Vilanterol (BREO ELLIPTA) 100-25 MCG/INH AEPB Inhale 1 puff into the lungs daily.  3 each  0  . mesalamine (LIALDA) 1.2 G EC tablet Take 1.2 g by mouth 3 (three) times daily as needed.       . midodrine (PROAMATINE) 2.5 MG tablet TAKE 1 TABLET BY MOUTH TWICE DAILY WITH A MEAL  180 tablet  1  . naproxen sodium (ANAPROX) 220 MG tablet Take 220 mg by mouth daily as needed.      . nitroGLYCERIN (NITROSTAT) 0.4 MG SL tablet Place 0.4 mg under the tongue every 5 (five) minutes as needed for chest pain.       . pantoprazole (PROTONIX) 40 MG tablet Take 40 mg by mouth daily.      . rosuvastatin (CRESTOR) 5 MG tablet Take 1 tablet (5 mg total) by mouth daily at 6 PM.  90 tablet  1  . SYNTHROID 125 MCG tablet TAKE 1 TABLET (125 MCG TOTAL) BY MOUTH DAILY BEFORE BREAKFAST.  90 tablet  1   No current facility-administered medications on file prior to visit.    Allergies  Allergen Reactions  . Codeine Hives  . Augmentin [Amoxicillin-Pot Clavulanate] Diarrhea and Nausea And Vomiting  . Penicillins Hives    Family History  Problem Relation Age of Onset  . Anesthesia problems Mother   . Hypotension Neg Hx   . Malignant hyperthermia Neg Hx   . Pseudochol deficiency Neg Hx   . Cancer Maternal Aunt     breast    History   Social History  . Marital Status: Widowed    Spouse Name: N/A    Number of Children: 4  . Years of Education: N/A   Occupational History  . retired     Press photographer   Social History Main Topics  . Smoking status: Former Smoker -- 1.00  packs/day for 40 years    Types:  Cigarettes    Quit date: 08/26/2002  . Smokeless tobacco: Never Used     Comment: quit several yrs ago  . Alcohol Use: Yes     Comment: 2 times week alcohol  . Drug Use: No  . Sexual Activity: No   Other Topics Concern  . None   Social History Narrative   Daughter is nurse in neuro ICU   Review of Systems - See HPI.  All other ROS are negative.  BP 131/64  Pulse 56  Temp(Src) 97.5 F (36.4 C) (Oral)  Resp 16  Ht 5' 11" (1.803 m)  Wt 181 lb (82.101 kg)  BMI 25.26 kg/m2  Physical Exam  Vitals reviewed. Constitutional: She is oriented to person, place, and time and well-developed, well-nourished, and in no distress.  HENT:  Head: Normocephalic and atraumatic.  Right Ear: External ear normal.  Left Ear: External ear normal.  Nose: Nose normal.  Mouth/Throat: Oropharynx is clear and moist. No oropharyngeal exudate.  TM within normal limits bilaterally.  Eyes: Conjunctivae are normal. Pupils are equal, round, and reactive to light.  Neck: Neck supple. No thyromegaly present.  Cardiovascular: Normal rate, regular rhythm and normal heart sounds.   Pulmonary/Chest: Effort normal and breath sounds normal. No respiratory distress. She has no wheezes. She has no rales. She exhibits no tenderness.  Lymphadenopathy:    She has no cervical adenopathy.  Neurological: She is alert and oriented to person, place, and time. No cranial nerve deficit.  Skin: Skin is warm and dry. No rash noted.  Psychiatric: Affect normal.    Recent Results (from the past 2160 hour(s))  BUN AND CREATININE (CC13)     Status: None   Collection Time    01/06/14 10:12 AM      Result Value Ref Range   BUN 11.3  7.0 - 26.0 mg/dL   Creatinine 1.0  0.6 - 1.1 mg/dL  CBC WITH DIFFERENTIAL     Status: Abnormal   Collection Time    02/08/14  5:04 PM      Result Value Ref Range   WBC 6.8  4.0 - 10.5 K/uL   RBC 4.72  3.87 - 5.11 MIL/uL   Hemoglobin 12.1  12.0 - 15.0 g/dL    HCT 39.0  36.0 - 46.0 %   MCV 82.6  78.0 - 100.0 fL   MCH 25.6 (*) 26.0 - 34.0 pg   MCHC 31.0  30.0 - 36.0 g/dL   RDW 15.2  11.5 - 15.5 %   Platelets 296  150 - 400 K/uL   Neutrophils Relative % 62  43 - 77 %   Neutro Abs 4.2  1.7 - 7.7 K/uL   Lymphocytes Relative 19  12 - 46 %   Lymphs Abs 1.3  0.7 - 4.0 K/uL   Monocytes Relative 12  3 - 12 %   Monocytes Absolute 0.8  0.1 - 1.0 K/uL   Eosinophils Relative 5  0 - 5 %   Eosinophils Absolute 0.3  0.0 - 0.7 K/uL   Basophils Relative 2 (*) 0 - 1 %   Basophils Absolute 0.1  0.0 - 0.1 K/uL  COMPREHENSIVE METABOLIC PANEL     Status: Abnormal   Collection Time    02/08/14  5:04 PM      Result Value Ref Range   Sodium 140  137 - 147 mEq/L   Potassium 4.7  3.7 - 5.3 mEq/L   Chloride 106  96 - 112 mEq/L   CO2 23  19 - 32 mEq/L   Glucose, Bld 103 (*) 70 - 99 mg/dL   BUN 13  6 - 23 mg/dL   Creatinine, Ser 0.91  0.50 - 1.10 mg/dL   Calcium 9.7  8.4 - 10.5 mg/dL   Total Protein 6.9  6.0 - 8.3 g/dL   Albumin 3.3 (*) 3.5 - 5.2 g/dL   AST 18  0 - 37 U/L   ALT 15  0 - 35 U/L   Alkaline Phosphatase 156 (*) 39 - 117 U/L   Total Bilirubin <0.2 (*) 0.3 - 1.2 mg/dL   GFR calc non Af Amer 58 (*) >90 mL/min   GFR calc Af Amer 67 (*) >90 mL/min   Comment: (NOTE)     The eGFR has been calculated using the CKD EPI equation.     This calculation has not been validated in all clinical situations.     eGFR's persistently <90 mL/min signify possible Chronic Kidney     Disease.   Anion gap 11  5 - 15  PRO B NATRIURETIC PEPTIDE     Status: None   Collection Time    02/08/14  5:04 PM      Result Value Ref Range   Pro B Natriuretic peptide (BNP) 85.4  0 - 450 pg/mL  TROPONIN I     Status: None   Collection Time    02/08/14  5:04 PM      Result Value Ref Range   Troponin I <0.30  <0.30 ng/mL   Comment:            Due to the release kinetics of cTnI,     a negative result within the first hours     of the onset of symptoms does not rule out      myocardial infarction with certainty.     If myocardial infarction is still suspected,     repeat the test at appropriate intervals.  TROPONIN I     Status: None   Collection Time    02/09/14  5:50 AM      Result Value Ref Range   Troponin I <0.30  <0.30 ng/mL   Comment:            Due to the release kinetics of cTnI,     a negative result within the first hours     of the onset of symptoms does not rule out     myocardial infarction with certainty.     If myocardial infarction is still suspected,     repeat the test at appropriate intervals.  COMPREHENSIVE METABOLIC PANEL     Status: Abnormal   Collection Time    02/09/14  5:50 AM      Result Value Ref Range   Sodium 141  137 - 147 mEq/L   Potassium 4.5  3.7 - 5.3 mEq/L   Chloride 105  96 - 112 mEq/L   CO2 24  19 - 32 mEq/L   Glucose, Bld 80  70 - 99 mg/dL   BUN 13  6 - 23 mg/dL   Creatinine, Ser 0.98  0.50 - 1.10 mg/dL   Calcium 9.9  8.4 - 10.5 mg/dL   Total Protein 6.4  6.0 - 8.3 g/dL   Albumin 3.1 (*) 3.5 - 5.2 g/dL   AST 16  0 - 37 U/L   ALT 14  0 - 35 U/L   Alkaline Phosphatase 143 (*) 39 - 117 U/L   Total Bilirubin 0.3  0.3 - 1.2 mg/dL   GFR calc non Af Amer 53 (*) >90 mL/min   GFR calc Af Amer 61 (*) >90 mL/min   Comment: (NOTE)     The eGFR has been calculated using the CKD EPI equation.     This calculation has not been validated in all clinical situations.     eGFR's persistently <90 mL/min signify possible Chronic Kidney     Disease.   Anion gap 12  5 - 15  CBC WITH DIFFERENTIAL     Status: None   Collection Time    02/09/14  5:50 AM      Result Value Ref Range   WBC 7.1  4.0 - 10.5 K/uL   RBC 4.72  3.87 - 5.11 MIL/uL   Hemoglobin 12.4  12.0 - 15.0 g/dL   HCT 39.8  36.0 - 46.0 %   MCV 84.3  78.0 - 100.0 fL   MCH 26.3  26.0 - 34.0 pg   MCHC 31.2  30.0 - 36.0 g/dL   RDW 15.2  11.5 - 15.5 %   Platelets 294  150 - 400 K/uL   Neutrophils Relative % 62  43 - 77 %   Neutro Abs 4.4  1.7 - 7.7 K/uL    Lymphocytes Relative 22  12 - 46 %   Lymphs Abs 1.6  0.7 - 4.0 K/uL   Monocytes Relative 10  3 - 12 %   Monocytes Absolute 0.7  0.1 - 1.0 K/uL   Eosinophils Relative 5  0 - 5 %   Eosinophils Absolute 0.3  0.0 - 0.7 K/uL   Basophils Relative 1  0 - 1 %   Basophils Absolute 0.1  0.0 - 0.1 K/uL  TSH     Status: None   Collection Time    02/09/14  5:50 AM      Result Value Ref Range   TSH 1.630  0.350 - 4.500 uIU/mL  HEMOGLOBIN A1C     Status: Abnormal   Collection Time    02/09/14  5:50 AM      Result Value Ref Range   Hemoglobin A1C 5.8 (*) <5.7 %   Comment: (NOTE)                                                                               According to the ADA Clinical Practice Recommendations for 2011, when     HbA1c is used as a screening test:      >=6.5%   Diagnostic of Diabetes Mellitus               (if abnormal result is confirmed)     5.7-6.4%   Increased risk of developing Diabetes Mellitus     References:Diagnosis and Classification of Diabetes Mellitus,Diabetes     OFHQ,1975,88(TGPQD 1):S62-S69 and Standards of Medical Care in             Diabetes - 2011,Diabetes Care,2011,34 (Suppl 1):S11-S61.   Mean Plasma Glucose 120 (*) <117 mg/dL   Comment: Performed at [facility]     Status: Abnormal   Collection Time    02/09/14  5:50 AM      Result  Value Ref Range   Cholesterol 210 (*) 0 - 200 mg/dL   Triglycerides 186 (*) <150 mg/dL   HDL 39 (*) >39 mg/dL   Total CHOL/HDL Ratio 5.4     VLDL 37  0 - 40 mg/dL   LDL Cholesterol 134 (*) 0 - 99 mg/dL   Comment:            Total Cholesterol/HDL:CHD Risk     Coronary Heart Disease Risk Table                         Men   Women      1/2 Average Risk   3.4   3.3      Average Risk       5.0   4.4      2 X Average Risk   9.6   7.1      3 X Average Risk  23.4   11.0                Use the calculated Patient Ratio     above and the CHD Risk Table     to determine the patient's CHD Risk.                 ATP III CLASSIFICATION (LDL):      <100     mg/dL   Optimal      100-129  mg/dL   Near or Above                        Optimal      130-159  mg/dL   Borderline      160-189  mg/dL   High      >190     mg/dL   Very High  TROPONIN I     Status: None   Collection Time    02/09/14 12:00 PM      Result Value Ref Range   Troponin I <0.30  <0.30 ng/mL   Comment:            Due to the release kinetics of cTnI,     a negative result within the first hours     of the onset of symptoms does not rule out     myocardial infarction with certainty.     If myocardial infarction is still suspected,     repeat the test at appropriate intervals.  TROPONIN I     Status: None   Collection Time    02/09/14  5:58 PM      Result Value Ref Range   Troponin I <0.30  <0.30 ng/mL   Comment:            Due to the release kinetics of cTnI,     a negative result within the first hours     of the onset of symptoms does not rule out     myocardial infarction with certainty.     If myocardial infarction is still suspected,     repeat the test at appropriate intervals.    Assessment/Plan: BPPV (benign paroxysmal positional vertigo) Will set up patient for Vestibular Rehabilitation.  New referral placed to ENT in [facility] per patient's request.  Continue medications as directed.  CVA (cerebral vascular accident) Patient doing well.  Is taking medications as directed.  PT/OT are helping with strength and function.  Will continue for at least the next month. Has upcoming  Neurology follow-up with Dr. Leonie Man.  Referral was placed for insurance purposes.

## 2014-02-24 NOTE — Progress Notes (Signed)
Pre visit review using our clinic review tool, if applicable. No additional management support is needed unless otherwise documented below in the visit note/SLS  

## 2014-02-24 NOTE — Patient Instructions (Signed)
Please continue medications as directed.  You will be contacted for an appointment to be evaluated by a new ENT physician. You will also be called regarding a follow-up appointment with Dr. Leonie Man. You will also be contacted to start Vestib.  Rehabilitation to help with your dizziness.  Please follow-up with me in 1 month.  Return sooner if needed.

## 2014-03-02 DIAGNOSIS — Z8673 Personal history of transient ischemic attack (TIA), and cerebral infarction without residual deficits: Secondary | ICD-10-CM | POA: Insufficient documentation

## 2014-03-02 NOTE — Assessment & Plan Note (Signed)
Will set up patient for Vestibular Rehabilitation.  New referral placed to ENT in [facility] per patient's request.  Continue medications as directed.

## 2014-03-02 NOTE — Assessment & Plan Note (Signed)
Patient doing well.  Is taking medications as directed.  PT/OT are helping with strength and function.  Will continue for at least the next month. Has upcoming Neurology follow-up with Dr. Leonie Man.  Referral was placed for insurance purposes.

## 2014-03-08 ENCOUNTER — Telehealth: Payer: Self-pay | Admitting: Physician Assistant

## 2014-03-08 NOTE — Telephone Encounter (Signed)
Ok to grant extension of PT.  Please call [facility] Go back regarding this.

## 2014-03-08 NOTE — Telephone Encounter (Signed)
Caller name: Telena Relation to pt: [facility] Call back number: [phone] Pharmacy:  Reason for call:   Jodi Frazier needs a verbal order extend home health occupational therapy for two weeks

## 2014-03-08 NOTE — Telephone Encounter (Signed)
Done

## 2014-03-29 ENCOUNTER — Encounter: Payer: Self-pay | Admitting: Physician Assistant

## 2014-03-29 ENCOUNTER — Ambulatory Visit (INDEPENDENT_AMBULATORY_CARE_PROVIDER_SITE_OTHER): Payer: Medicare Other | Admitting: Physician Assistant

## 2014-03-29 VITALS — BP 120/52 | HR 56 | Temp 97.8°F | Resp 16 | Ht 71.0 in | Wt 184.5 lb

## 2014-03-29 DIAGNOSIS — Z87440 Personal history of urinary (tract) infections: Secondary | ICD-10-CM

## 2014-03-29 DIAGNOSIS — N390 Urinary tract infection, site not specified: Secondary | ICD-10-CM

## 2014-03-29 DIAGNOSIS — F0390 Unspecified dementia without behavioral disturbance: Secondary | ICD-10-CM

## 2014-03-29 DIAGNOSIS — H8110 Benign paroxysmal vertigo, unspecified ear: Secondary | ICD-10-CM

## 2014-03-29 DIAGNOSIS — I6350 Cerebral infarction due to unspecified occlusion or stenosis of unspecified cerebral artery: Secondary | ICD-10-CM

## 2014-03-29 DIAGNOSIS — Z23 Encounter for immunization: Secondary | ICD-10-CM

## 2014-03-29 MED ORDER — CEPHALEXIN 500 MG PO CAPS: 500.0000 mg | ORAL_CAPSULE | Freq: Every day | ORAL | Status: DC

## 2014-03-29 MED ORDER — DONEPEZIL HCL 10 MG PO TABS: 15.0000 mg | ORAL_TABLET | Freq: Every day | ORAL | Status: DC

## 2014-03-29 NOTE — Progress Notes (Signed)
Pre visit review using our clinic review tool, if applicable. No additional management support is needed unless otherwise documented below in the visit note/SLS  

## 2014-03-29 NOTE — Patient Instructions (Signed)
Please continue medications as directed.  Follow-up with Dr. Isidore Moos, Dr. Lamonte Sakai and Dr. Ilda Foil as scheduled.  Follow-up with me in 6 months for an annual exam.  Return sooner if needed.  I am glad you are doing so well.

## 2014-03-30 DIAGNOSIS — Z23 Encounter for immunization: Secondary | ICD-10-CM

## 2014-03-30 NOTE — Assessment & Plan Note (Signed)
Much improved.  Continue Vestibular Rehabilitation.  Follow-up with ENT as scheduled.

## 2014-03-30 NOTE — Assessment & Plan Note (Signed)
Doing well with prophylactic daily Keflex. Medication refilled.  Encouraged daily probiotic.

## 2014-03-30 NOTE — Progress Notes (Signed)
Patient presents to clinic today c/o for 1 month follow-up after start PT for positional vertigo.  Patient states she has been dong very well since starting PT and seeing ENT.  Is now off of the Meclizine. Dizziness is much more infrequent and mild when occuring.  Patient has been released by Urology for her chronic UTI. Patient taking Keflex 500 mg daily with good results.  Patient requesting we take over this medication.   Past Medical History  Diagnosis Date  . Hypertension   . Colitis   . Coronary artery disease   . High cholesterol     takes Red Yeast Rice and Fish OIl daily  . Kidney stones   . PONV (postoperative nausea and vomiting)   . MI (myocardial infarction) 2010  . Shortness of breath     with exertion  . Pneumonia     hx of 20+yrs ago  . COPD (chronic obstructive pulmonary disease)     pleurisy or COPD exascerbation > 71yrago  . Headache(784.0)   . Vertigo     HTN related bc gets up too fast  . Arthritis   . Bruises easily   . History of shingles 15yrago    in eye;occasionally gets some residual   . Accessory skin tags     arms/legs  . GERD (gastroesophageal reflux disease)   . Colitis   . Diverticulosis   . Urinary frequency   . Stress incontinence   . History of kidney stones   . Hypothyroidism     takes sYnthroid daily  . Cataract     early stage on right  . Macular degeneration, wet   . Depression     some but takes Amitryptylline nightly  . Right-sided chest wall pain 08/28/12    PRESENTATION - RIGHT SIDED ANTERIOR CHEST PAIN  . Weight loss 08/28/12    10 LB WEIGHT LOSS OVER 3 MONTHS  . Lung mass 08/12/12    CHEST-XRAY/ PET -LOBULAR MASS LLL - 4.1 X 3.6 X 4.2 CM  . Dementia     mild  . S/P radiation therapy  10/05/2012-11/20/2012    Left Lower Lung and hilum / 70 Gy in 35 fractions  . Hypertension   . At risk for fall due to comorbid condition     5 falls - last Early December  . Vertigo   . Orthostatic hypotension   . S/P radiation therapy  2/13, 2/20, 2/23 (2015)    SBRT to Right Lung  . Adenocarcinoma of lung 09/07/12    needle core bx-LLL-adenocarcinoma    Current Outpatient Prescriptions on File Prior to Visit  Medication Sig Dispense Refill  . acetaminophen (TYLENOL) 325 MG tablet Take 650 mg by mouth every 6 (six) hours as needed (pain).       . Marland Kitchenlbuterol (PROVENTIL HFA;VENTOLIN HFA) 108 (90 BASE) MCG/ACT inhaler Inhale 2 puffs into the lungs every 6 (six) hours as needed for wheezing.  1 Inhaler  3  . ALPRAZolam (XANAX) 0.25 MG tablet Take 1 tablet (0.25 mg total) by mouth 2 (two) times daily as needed for anxiety.  30 tablet  0  . amitriptyline (ELAVIL) 25 MG tablet Take 1 tablet (25 mg total) by mouth at bedtime.  90 tablet  0  . amLODipine (NORVASC) 2.5 MG tablet Take 1 tablet (2.5 mg total) by mouth daily.  90 tablet  0  . BIOTIN PO Take 1 tablet by mouth daily.       . carvedilol (  COREG) 6.25 MG tablet Take 6.25 mg by mouth 2 (two) times daily with a meal.      . clopidogrel (PLAVIX) 75 MG tablet Take 1 tablet (75 mg total) by mouth daily.  90 tablet  1  . cyanocobalamin (,VITAMIN B-12,) 1000 MCG/ML injection Inject 1,000 mcg into the muscle every 30 (thirty) days. Last dose was on 01-29-14      . cycloSPORINE (RESTASIS) 0.05 % ophthalmic emulsion Place 1 drop into both eyes 2 (two) times daily.  0.4 mL  0  . escitalopram (LEXAPRO) 10 MG tablet TAKE 1 TABLET BY MOUTH DAILY  90 tablet  1  . ezetimibe (ZETIA) 10 MG tablet Take 1 tablet (10 mg total) by mouth daily.  90 tablet  1  . Fluticasone Furoate-Vilanterol (BREO ELLIPTA) 100-25 MCG/INH AEPB Inhale 1 puff into the lungs daily.  3 each  0  . mesalamine (LIALDA) 1.2 G EC tablet Take 1.2 g by mouth 3 (three) times daily as needed.       . midodrine (PROAMATINE) 2.5 MG tablet TAKE 1 TABLET BY MOUTH TWICE DAILY WITH A MEAL  180 tablet  1  . naproxen sodium (ANAPROX) 220 MG tablet Take 220 mg by mouth daily as needed.      . nitroGLYCERIN (NITROSTAT) 0.4 MG SL tablet Place  0.4 mg under the tongue every 5 (five) minutes as needed for chest pain.       . pantoprazole (PROTONIX) 40 MG tablet Take 40 mg by mouth daily.      . rosuvastatin (CRESTOR) 5 MG tablet Take 1 tablet (5 mg total) by mouth daily at 6 PM.  90 tablet  1  . SYNTHROID 125 MCG tablet TAKE 1 TABLET (125 MCG TOTAL) BY MOUTH DAILY BEFORE BREAKFAST.  90 tablet  1   No current facility-administered medications on file prior to visit.    Allergies  Allergen Reactions  . Codeine Hives  . Augmentin [Amoxicillin-Pot Clavulanate] Diarrhea and Nausea And Vomiting  . Penicillins Hives    Family History  Problem Relation Age of Onset  . Anesthesia problems Mother   . Hypotension Neg Hx   . Malignant hyperthermia Neg Hx   . Pseudochol deficiency Neg Hx   . Cancer Maternal Aunt     breast    History   Social History  . Marital Status: Widowed    Spouse Name: N/A    Number of Children: 4  . Years of Education: N/A   Occupational History  . retired     Press photographer   Social History Main Topics  . Smoking status: Former Smoker -- 1.00 packs/day for 40 years    Types: Cigarettes    Quit date: 08/26/2002  . Smokeless tobacco: Never Used     Comment: quit several yrs ago  . Alcohol Use: Yes     Comment: 2 times week alcohol  . Drug Use: No  . Sexual Activity: No   Other Topics Concern  . None   Social History Narrative   Daughter is nurse in neuro ICU   Review of Systems - See HPI.  All other ROS are negative.  BP 120/52  Pulse 56  Temp(Src) 97.8 F (36.6 C) (Oral)  Resp 16  Ht 5' 11" (1.803 m)  Wt 184 lb 8 oz (83.689 kg)  BMI 25.74 kg/m2  SpO2 98%  Physical Exam  Vitals reviewed. Constitutional: She is oriented to person, place, and time and well-developed, well-nourished, and in no distress.  HENT:  Head: Normocephalic and atraumatic.  Right Ear: External ear normal.  Left Ear: External ear normal.  Nose: Nose normal.  Mouth/Throat: Oropharynx is clear and moist. No  oropharyngeal exudate.  Eyes: Conjunctivae are normal. Pupils are equal, round, and reactive to light.  Neck: Neck supple.  Cardiovascular: Normal rate, regular rhythm and normal heart sounds.   Pulmonary/Chest: Effort normal and breath sounds normal. No respiratory distress. She has no wheezes. She has no rales. She exhibits no tenderness.  Neurological: She is alert and oriented to person, place, and time.  Skin: Skin is warm and dry. No rash noted.    Recent Results (from the past 2160 hour(s))  BUN AND CREATININE (CC13)     Status: None   Collection Time    01/06/14 10:12 AM      Result Value Ref Range   BUN 11.3  7.0 - 26.0 mg/dL   Creatinine 1.0  0.6 - 1.1 mg/dL  CBC WITH DIFFERENTIAL     Status: Abnormal   Collection Time    02/08/14  5:04 PM      Result Value Ref Range   WBC 6.8  4.0 - 10.5 K/uL   RBC 4.72  3.87 - 5.11 MIL/uL   Hemoglobin 12.1  12.0 - 15.0 g/dL   HCT 39.0  36.0 - 46.0 %   MCV 82.6  78.0 - 100.0 fL   MCH 25.6 (*) 26.0 - 34.0 pg   MCHC 31.0  30.0 - 36.0 g/dL   RDW 15.2  11.5 - 15.5 %   Platelets 296  150 - 400 K/uL   Neutrophils Relative % 62  43 - 77 %   Neutro Abs 4.2  1.7 - 7.7 K/uL   Lymphocytes Relative 19  12 - 46 %   Lymphs Abs 1.3  0.7 - 4.0 K/uL   Monocytes Relative 12  3 - 12 %   Monocytes Absolute 0.8  0.1 - 1.0 K/uL   Eosinophils Relative 5  0 - 5 %   Eosinophils Absolute 0.3  0.0 - 0.7 K/uL   Basophils Relative 2 (*) 0 - 1 %   Basophils Absolute 0.1  0.0 - 0.1 K/uL  COMPREHENSIVE METABOLIC PANEL     Status: Abnormal   Collection Time    02/08/14  5:04 PM      Result Value Ref Range   Sodium 140  137 - 147 mEq/L   Potassium 4.7  3.7 - 5.3 mEq/L   Chloride 106  96 - 112 mEq/L   CO2 23  19 - 32 mEq/L   Glucose, Bld 103 (*) 70 - 99 mg/dL   BUN 13  6 - 23 mg/dL   Creatinine, Ser 0.91  0.50 - 1.10 mg/dL   Calcium 9.7  8.4 - 10.5 mg/dL   Total Protein 6.9  6.0 - 8.3 g/dL   Albumin 3.3 (*) 3.5 - 5.2 g/dL   AST 18  0 - 37 U/L   ALT 15   0 - 35 U/L   Alkaline Phosphatase 156 (*) 39 - 117 U/L   Total Bilirubin <0.2 (*) 0.3 - 1.2 mg/dL   GFR calc non Af Amer 58 (*) >90 mL/min   GFR calc Af Amer 67 (*) >90 mL/min   Comment: (NOTE)     The eGFR has been calculated using the CKD EPI equation.     This calculation has not been validated in all clinical situations.     eGFR's persistently <90 mL/min signify possible Chronic  Kidney     Disease.   Anion gap 11  5 - 15  PRO B NATRIURETIC PEPTIDE     Status: None   Collection Time    02/08/14  5:04 PM      Result Value Ref Range   Pro B Natriuretic peptide (BNP) 85.4  0 - 450 pg/mL  TROPONIN I     Status: None   Collection Time    02/08/14  5:04 PM      Result Value Ref Range   Troponin I <0.30  <0.30 ng/mL   Comment:            Due to the release kinetics of cTnI,     a negative result within the first hours     of the onset of symptoms does not rule out     myocardial infarction with certainty.     If myocardial infarction is still suspected,     repeat the test at appropriate intervals.  TROPONIN I     Status: None   Collection Time    02/09/14  5:50 AM      Result Value Ref Range   Troponin I <0.30  <0.30 ng/mL   Comment:            Due to the release kinetics of cTnI,     a negative result within the first hours     of the onset of symptoms does not rule out     myocardial infarction with certainty.     If myocardial infarction is still suspected,     repeat the test at appropriate intervals.  COMPREHENSIVE METABOLIC PANEL     Status: Abnormal   Collection Time    02/09/14  5:50 AM      Result Value Ref Range   Sodium 141  137 - 147 mEq/L   Potassium 4.5  3.7 - 5.3 mEq/L   Chloride 105  96 - 112 mEq/L   CO2 24  19 - 32 mEq/L   Glucose, Bld 80  70 - 99 mg/dL   BUN 13  6 - 23 mg/dL   Creatinine, Ser 0.98  0.50 - 1.10 mg/dL   Calcium 9.9  8.4 - 10.5 mg/dL   Total Protein 6.4  6.0 - 8.3 g/dL   Albumin 3.1 (*) 3.5 - 5.2 g/dL   AST 16  0 - 37 U/L   ALT 14   0 - 35 U/L   Alkaline Phosphatase 143 (*) 39 - 117 U/L   Total Bilirubin 0.3  0.3 - 1.2 mg/dL   GFR calc non Af Amer 53 (*) >90 mL/min   GFR calc Af Amer 61 (*) >90 mL/min   Comment: (NOTE)     The eGFR has been calculated using the CKD EPI equation.     This calculation has not been validated in all clinical situations.     eGFR's persistently <90 mL/min signify possible Chronic Kidney     Disease.   Anion gap 12  5 - 15  CBC WITH DIFFERENTIAL     Status: None   Collection Time    02/09/14  5:50 AM      Result Value Ref Range   WBC 7.1  4.0 - 10.5 K/uL   RBC 4.72  3.87 - 5.11 MIL/uL   Hemoglobin 12.4  12.0 - 15.0 g/dL   HCT 39.8  36.0 - 46.0 %   MCV 84.3  78.0 - 100.0 fL   MCH 26.3  26.0 - 34.0 pg   MCHC 31.2  30.0 - 36.0 g/dL   RDW 15.2  11.5 - 15.5 %   Platelets 294  150 - 400 K/uL   Neutrophils Relative % 62  43 - 77 %   Neutro Abs 4.4  1.7 - 7.7 K/uL   Lymphocytes Relative 22  12 - 46 %   Lymphs Abs 1.6  0.7 - 4.0 K/uL   Monocytes Relative 10  3 - 12 %   Monocytes Absolute 0.7  0.1 - 1.0 K/uL   Eosinophils Relative 5  0 - 5 %   Eosinophils Absolute 0.3  0.0 - 0.7 K/uL   Basophils Relative 1  0 - 1 %   Basophils Absolute 0.1  0.0 - 0.1 K/uL  TSH     Status: None   Collection Time    02/09/14  5:50 AM      Result Value Ref Range   TSH 1.630  0.350 - 4.500 uIU/mL  HEMOGLOBIN A1C     Status: Abnormal   Collection Time    02/09/14  5:50 AM      Result Value Ref Range   Hemoglobin A1C 5.8 (*) <5.7 %   Comment: (NOTE)                                                                               According to the ADA Clinical Practice Recommendations for 2011, when     HbA1c is used as a screening test:      >=6.5%   Diagnostic of Diabetes Mellitus               (if abnormal result is confirmed)     5.7-6.4%   Increased risk of developing Diabetes Mellitus     References:Diagnosis and Classification of Diabetes Mellitus,Diabetes     ERXV,4008,67(YPPJK 1):S62-S69 and  Standards of Medical Care in             Diabetes - 2011,Diabetes Care,2011,34 (Suppl 1):S11-S61.   Mean Plasma Glucose 120 (*) <117 mg/dL   Comment: Performed at [facility]: Abnormal   Collection Time    02/09/14  5:50 AM      Result Value Ref Range   Cholesterol 210 (*) 0 - 200 mg/dL   Triglycerides 186 (*) <150 mg/dL   HDL 39 (*) >39 mg/dL   Total CHOL/HDL Ratio 5.4     VLDL 37  0 - 40 mg/dL   LDL Cholesterol 134 (*) 0 - 99 mg/dL   Comment:            Total Cholesterol/HDL:CHD Risk     Coronary Heart Disease Risk Table                         Men   Women      1/2 Average Risk   3.4   3.3      Average Risk       5.0   4.4      2 X Average Risk   9.6   7.1      3 X Average Risk  23.4   11.0                Use the calculated Patient Ratio     above and the CHD Risk Table     to determine the patient's CHD Risk.                ATP III CLASSIFICATION (LDL):      <100     mg/dL   Optimal      100-129  mg/dL   Near or Above                        Optimal      130-159  mg/dL   Borderline      160-189  mg/dL   High      >190     mg/dL   Very High  TROPONIN I     Status: None   Collection Time    02/09/14 12:00 PM      Result Value Ref Range   Troponin I <0.30  <0.30 ng/mL   Comment:            Due to the release kinetics of cTnI,     a negative result within the first hours     of the onset of symptoms does not rule out     myocardial infarction with certainty.     If myocardial infarction is still suspected,     repeat the test at appropriate intervals.  TROPONIN I     Status: None   Collection Time    02/09/14  5:58 PM      Result Value Ref Range   Troponin I <0.30  <0.30 ng/mL   Comment:            Due to the release kinetics of cTnI,     a negative result within the first hours     of the onset of symptoms does not rule out     myocardial infarction with certainty.     If myocardial infarction is still suspected,     repeat the  test at appropriate intervals.   Assessment/Plan: BPPV (benign paroxysmal positional vertigo) Much improved.  Continue Vestibular Rehabilitation.  Follow-up with ENT as scheduled.  Recurrent UTI Doing well with prophylactic daily Keflex. Medication refilled.  Encouraged daily probiotic.

## 2014-04-05 ENCOUNTER — Other Ambulatory Visit: Payer: Self-pay | Admitting: Physician Assistant

## 2014-04-05 NOTE — Telephone Encounter (Signed)
eScribe request from [facility] for refill on Amitriptyline 25 mg Last filled - 04.17.15, #[id number] Last AEX - 09.29.15 Next AEX - 6 Mths. Please Advise on refills/SLS

## 2014-04-07 ENCOUNTER — Encounter: Payer: Self-pay | Admitting: Neurology

## 2014-04-07 ENCOUNTER — Encounter (INDEPENDENT_AMBULATORY_CARE_PROVIDER_SITE_OTHER): Payer: Self-pay

## 2014-04-07 ENCOUNTER — Ambulatory Visit (INDEPENDENT_AMBULATORY_CARE_PROVIDER_SITE_OTHER): Payer: Medicare Other | Admitting: Neurology

## 2014-04-07 VITALS — BP 123/71 | HR 51 | Ht 71.0 in | Wt 185.0 lb

## 2014-04-07 DIAGNOSIS — E785 Hyperlipidemia, unspecified: Secondary | ICD-10-CM

## 2014-04-07 DIAGNOSIS — I10 Essential (primary) hypertension: Secondary | ICD-10-CM

## 2014-04-07 DIAGNOSIS — I639 Cerebral infarction, unspecified: Secondary | ICD-10-CM

## 2014-04-07 NOTE — Patient Instructions (Signed)
-   continue plavix and statin for stroke prevention - continue to follow up with PCP for stroke risk factor modification - sit up and stand up slowly and avoid hypotension and fall - continue maneuver for vertigo - follow up in 3 months.

## 2014-04-07 NOTE — Progress Notes (Signed)
STROKE NEUROLOGY FOLLOW UP NOTE  NAME: Jodi Frazier DOB: 13-Jan-1931  REASON FOR VISIT: stroke follow up HISTORY FROM: pt and chart  Today we had the pleasure of seeing Jodi Frazier in follow-up at our Neurology [facility]. Pt was accompanied by daughter.   History Summary Jodi Frazier is an 80 y.o. female with a history of chronic vertigo, hypertension, hyperlipidemia, COPD, myocardial infarction and adenocarcinoma of the lung was admitted on 02/08/14 due to sudden vertigo exacerbated by movement. She was diagnosed with lung cancer in January this year underwent radiation. During that time she had a fall due to hypotension, was put on midodrine. While she was in rehabilitation, she developed dizziness and vertigo, was put on meclizine and trained for BPPV maneuvers. She had subsequently an ENT consult, and was able to wean off meclizine over time. At baseline she still has intermittent dizziness, but not as severe as before. On 02/08/2014, she had again episode of dizziness, more consistent with vertigo. She was sent to the ER, was giving meclizine, and her symptoms resolved within 2 hours. MRI showed 5 mm acute right cerebellar ischemic infarction. MRA was unremarkable except for atherosclerotic changes. Patient has no previous history of stroke nor TIA. She's been taking aspirin 81 mg per day, therefore she was put on plavix for stroke prevention. She was discharged in good conditions.  Interval History During the interval time, the patient has been doing well. No recurrent symptoms. She is on Plavix for stroke prevention. Her LDL was 134, so she is on zetia and Crestor. For Crestor, she is on 5 mg 4 times per week due to previous side effect from myalgia with statins.   REVIEW OF SYSTEMS: Full 14 system review of systems performed and notable only for those listed below and in HPI above, all others are negative:  Constitutional: N/A  Cardiovascular: N/A  Ear/Nose/Throat: N/A  Skin:  N/A  Eyes: N/A  Respiratory: N/A  Gastroitestinal: N/A  Genitourinary: N/A Hematology/Lymphatic: N/A  Endocrine: N/A  Musculoskeletal: N/A  Allergy/Immunology: N/A  Neurological: N/A  Psychiatric: N/A  The following represents the patient's updated allergies and side effects list: Allergies  Allergen Reactions  . Codeine Hives  . Augmentin [Amoxicillin-Pot Clavulanate] Diarrhea and Nausea And Vomiting  . Penicillins Hives    Labs since last visit of relevance include the following: Results for orders placed during the hospital encounter of 02/08/14  CBC WITH DIFFERENTIAL      Result Value Ref Range   WBC 6.8  4.0 - 10.5 K/uL   RBC 4.72  3.87 - 5.11 MIL/uL   Hemoglobin 12.1  12.0 - 15.0 g/dL   HCT 39.0  36.0 - 46.0 %   MCV 82.6  78.0 - 100.0 fL   MCH 25.6 (*) 26.0 - 34.0 pg   MCHC 31.0  30.0 - 36.0 g/dL   RDW 15.2  11.5 - 15.5 %   Platelets 296  150 - 400 K/uL   Neutrophils Relative % 62  43 - 77 %   Neutro Abs 4.2  1.7 - 7.7 K/uL   Lymphocytes Relative 19  12 - 46 %   Lymphs Abs 1.3  0.7 - 4.0 K/uL   Monocytes Relative 12  3 - 12 %   Monocytes Absolute 0.8  0.1 - 1.0 K/uL   Eosinophils Relative 5  0 - 5 %   Eosinophils Absolute 0.3  0.0 - 0.7 K/uL   Basophils Relative 2 (*) 0 - 1 %  Basophils Absolute 0.1  0.0 - 0.1 K/uL  COMPREHENSIVE METABOLIC PANEL      Result Value Ref Range   Sodium 140  137 - 147 mEq/L   Potassium 4.7  3.7 - 5.3 mEq/L   Chloride 106  96 - 112 mEq/L   CO2 23  19 - 32 mEq/L   Glucose, Bld 103 (*) 70 - 99 mg/dL   BUN 13  6 - 23 mg/dL   Creatinine, Ser 0.91  0.50 - 1.10 mg/dL   Calcium 9.7  8.4 - 10.5 mg/dL   Total Protein 6.9  6.0 - 8.3 g/dL   Albumin 3.3 (*) 3.5 - 5.2 g/dL   AST 18  0 - 37 U/L   ALT 15  0 - 35 U/L   Alkaline Phosphatase 156 (*) 39 - 117 U/L   Total Bilirubin <0.2 (*) 0.3 - 1.2 mg/dL   GFR calc non Af Amer 58 (*) >90 mL/min   GFR calc Af Amer 67 (*) >90 mL/min   Anion gap 11  5 - 15  PRO B NATRIURETIC PEPTIDE       Result Value Ref Range   Pro B Natriuretic peptide (BNP) 85.4  0 - 450 pg/mL  TROPONIN I      Result Value Ref Range   Troponin I <0.30  <0.30 ng/mL  TROPONIN I      Result Value Ref Range   Troponin I <0.30  <0.30 ng/mL  TROPONIN I      Result Value Ref Range   Troponin I <0.30  <0.30 ng/mL  TROPONIN I      Result Value Ref Range   Troponin I <0.30  <0.30 ng/mL  COMPREHENSIVE METABOLIC PANEL      Result Value Ref Range   Sodium 141  137 - 147 mEq/L   Potassium 4.5  3.7 - 5.3 mEq/L   Chloride 105  96 - 112 mEq/L   CO2 24  19 - 32 mEq/L   Glucose, Bld 80  70 - 99 mg/dL   BUN 13  6 - 23 mg/dL   Creatinine, Ser 0.98  0.50 - 1.10 mg/dL   Calcium 9.9  8.4 - 10.5 mg/dL   Total Protein 6.4  6.0 - 8.3 g/dL   Albumin 3.1 (*) 3.5 - 5.2 g/dL   AST 16  0 - 37 U/L   ALT 14  0 - 35 U/L   Alkaline Phosphatase 143 (*) 39 - 117 U/L   Total Bilirubin 0.3  0.3 - 1.2 mg/dL   GFR calc non Af Amer 53 (*) >90 mL/min   GFR calc Af Amer 61 (*) >90 mL/min   Anion gap 12  5 - 15  CBC WITH DIFFERENTIAL      Result Value Ref Range   WBC 7.1  4.0 - 10.5 K/uL   RBC 4.72  3.87 - 5.11 MIL/uL   Hemoglobin 12.4  12.0 - 15.0 g/dL   HCT 39.8  36.0 - 46.0 %   MCV 84.3  78.0 - 100.0 fL   MCH 26.3  26.0 - 34.0 pg   MCHC 31.2  30.0 - 36.0 g/dL   RDW 15.2  11.5 - 15.5 %   Platelets 294  150 - 400 K/uL   Neutrophils Relative % 62  43 - 77 %   Neutro Abs 4.4  1.7 - 7.7 K/uL   Lymphocytes Relative 22  12 - 46 %   Lymphs Abs 1.6  0.7 - 4.0 K/uL  Monocytes Relative 10  3 - 12 %   Monocytes Absolute 0.7  0.1 - 1.0 K/uL   Eosinophils Relative 5  0 - 5 %   Eosinophils Absolute 0.3  0.0 - 0.7 K/uL   Basophils Relative 1  0 - 1 %   Basophils Absolute 0.1  0.0 - 0.1 K/uL  TSH      Result Value Ref Range   TSH 1.630  0.350 - 4.500 uIU/mL  HEMOGLOBIN A1C      Result Value Ref Range   Hemoglobin A1C 5.8 (*) <5.7 %   Mean Plasma Glucose 120 (*) <117 mg/dL  LIPID PANEL      Result Value Ref Range   Cholesterol  210 (*) 0 - 200 mg/dL   Triglycerides 186 (*) <150 mg/dL   HDL 39 (*) >39 mg/dL   Total CHOL/HDL Ratio 5.4     VLDL 37  0 - 40 mg/dL   LDL Cholesterol 134 (*) 0 - 99 mg/dL    The neurologically relevant items on the patient's problem list were reviewed on today's visit.  Neurologic Examination  A problem focused neurological exam (12 or more points of the single system neurologic examination, vital signs counts as 1 point, cranial nerves count for 8 points) was performed.  Blood pressure 123/71, pulse 51, height 5\' 11"  (1.803 m), weight 185 lb (83.915 kg).  General - Well nourished, well developed, in no apparent distress.  Ophthalmologic - not able to see through.  Cardiovascular - Regular rate and rhythm with no murmur.  Mental Status -  Level of arousal and orientation to time, place, and person were intact. Language including expression, naming, repetition, comprehension was assessed and found intact. Fund of Knowledge was assessed and was intact.  Cranial Nerves II - XII - II - Visual field intact OU. III, IV, VI - Extraocular movements intact. V - Facial sensation intact bilaterally. VII - Facial movement intact bilaterally. VIII - Hearing & vestibular intact bilaterally. X - Palate elevates symmetrically. XI - Chin turning & shoulder shrug intact bilaterally. XII - Tongue protrusion intact.  Motor Strength - The patient's strength was normal in all extremities and pronator drift was absent.  Bulk was normal and fasciculations were absent.   Motor Tone - Muscle tone was assessed at the neck and appendages and was normal.  Reflexes - The patient's reflexes were 1+ in all extremities and she had no pathological reflexes.  Sensory - Light touch, temperature/pinprick were assessed and were normal.    Coordination - The patient had normal movements in the hands and feet with no ataxia or dysmetria.  Tremor was absent.  Gait and Station - walk with cane, slow with small  strides, but steady.  Data reviewed: I personally reviewed the images and agree with the radiology interpretations.  MRI and MRA of the brain 1. 5 mm acute right cerebellar infarct.  2. No evidence of major intracranial arterial occlusion.  3. Intracranial atherosclerosis appears to have increased from the  prior MRA, although findings may be accentuated by motion artifact  on the current examination. There is moderate irregular narrowing of  the nondominant right vertebral artery as well as of the right  greater than left proximal PCAs.  Carotid Doppler 1-39% internal carotid artery stenosis bilaterally. Vertebral arteries are patent with antegrade flow.  2D Echocardiogram Left ventricle: The cavity size was normal. There was mild concentric hypertrophy. Systolic function was vigorous. The estimated ejection fraction was in the range of 65%  to 70%. Wall motion was normal; there were no regional wall motion abnormalities.  CXR Similar appearance of bilateral radiation changes superimposed upon  COPD/chronic bronchitis. No convincing evidence of acute  superimposed process.   Component     Latest Ref Rng 02/09/2014  Cholesterol     0 - 200 mg/dL 210 (H)  Triglycerides     <150 mg/dL 186 (H)  HDL     >39 mg/dL 39 (L)  Total CHOL/HDL Ratio      5.4  VLDL     0 - 40 mg/dL 37  LDL (calc)     0 - 99 mg/dL 134 (H)  Hemoglobin A1C     <5.7 % 5.8 (H)  Mean Plasma Glucose     <117 mg/dL 120 (H)  TSH     0.350 - 4.500 uIU/mL 1.630    Assessment: As you may recall, she is a 80 y.o. Caucasian female with PMH of chronic vertigo, hypertension, hyperlipidemia, COPD, myocardial infarction and adenocarcinoma of the lung was admitted on 02/08/14 due to sudden vertigo exacerbated by movement. MRI showed right cerebellar small stroke. MRA showed intracranial stenosis. LDL 134. She was discharged on plavix and statin. She has chronic vertigo and followed with ENT and underwent maneuver training.     Plan:  - He and statin for stroke prevention  - Continue follow up with PCP for stroke risk factor modification  - Continue working maneuvers  - Avoid fall and hypotension - RTC in 3 months  No orders of the defined types were placed in this encounter.    No orders of the defined types were placed in this encounter.    Patient Instructions  - continue plavix and statin for stroke prevention - continue to follow up with PCP for stroke risk factor modification - sit up and stand up slowly and avoid hypotension and fall - continue maneuver for vertigo - follow up in 3 months.   Rosalin Hawking, MD PhD [facility] [address], [place], [place] [address] [phone]

## 2014-04-22 ENCOUNTER — Ambulatory Visit
Admission: RE | Admit: 2014-04-22 | Discharge: 2014-04-22 | Disposition: A | Discharge status: Home / Self Care | Payer: Medicare Other | Source: Ambulatory Visit | Attending: Radiation Oncology | Admitting: Radiation Oncology

## 2014-04-22 ENCOUNTER — Encounter (HOSPITAL_COMMUNITY): Payer: Self-pay

## 2014-04-22 ENCOUNTER — Encounter: Payer: Self-pay | Admitting: Radiation Oncology

## 2014-04-22 ENCOUNTER — Ambulatory Visit (HOSPITAL_COMMUNITY)
Admission: RE | Admit: 2014-04-22 | Discharge: 2014-04-22 | Disposition: A | Discharge status: Home / Self Care | Payer: Medicare Other | Source: Ambulatory Visit | Attending: Radiation Oncology | Admitting: Radiation Oncology

## 2014-04-22 VITALS — BP 132/46 | HR 57 | Temp 97.5°F | Ht 71.0 in | Wt 186.6 lb

## 2014-04-22 DIAGNOSIS — Z923 Personal history of irradiation: Secondary | ICD-10-CM | POA: Insufficient documentation

## 2014-04-22 DIAGNOSIS — I252 Old myocardial infarction: Secondary | ICD-10-CM | POA: Diagnosis not present

## 2014-04-22 DIAGNOSIS — R918 Other nonspecific abnormal finding of lung field: Secondary | ICD-10-CM | POA: Insufficient documentation

## 2014-04-22 DIAGNOSIS — Z87891 Personal history of nicotine dependence: Secondary | ICD-10-CM | POA: Insufficient documentation

## 2014-04-22 DIAGNOSIS — C3432 Malignant neoplasm of lower lobe, left bronchus or lung: Secondary | ICD-10-CM | POA: Insufficient documentation

## 2014-04-22 DIAGNOSIS — I10 Essential (primary) hypertension: Secondary | ICD-10-CM | POA: Insufficient documentation

## 2014-04-22 DIAGNOSIS — C3430 Malignant neoplasm of lower lobe, unspecified bronchus or lung: Secondary | ICD-10-CM

## 2014-04-22 DIAGNOSIS — J449 Chronic obstructive pulmonary disease, unspecified: Secondary | ICD-10-CM | POA: Insufficient documentation

## 2014-04-22 DIAGNOSIS — I2510 Atherosclerotic heart disease of native coronary artery without angina pectoris: Secondary | ICD-10-CM | POA: Diagnosis not present

## 2014-04-22 DIAGNOSIS — C3490 Malignant neoplasm of unspecified part of unspecified bronchus or lung: Secondary | ICD-10-CM | POA: Diagnosis present

## 2014-04-22 DIAGNOSIS — R590 Localized enlarged lymph nodes: Secondary | ICD-10-CM | POA: Diagnosis not present

## 2014-04-22 LAB — BUN AND CREATININE (CC13)
BUN: 15.6 mg/dL (ref 7.0–26.0)
Creatinine: 1 mg/dL (ref 0.6–1.1)

## 2014-04-22 MED ORDER — IOHEXOL 300 MG/ML  SOLN
80.0000 mL | Freq: Once | INTRAMUSCULAR | Status: AC | PRN
  Administered 2014-04-22: 80 mL via INTRAVENOUS

## 2014-04-22 NOTE — Progress Notes (Signed)
Ms. Verdi is her for reassessment s/p radiation therapy to her left lower lung.  She is traveling by W/C, but typically is ambulatory. Note mild wheezing.  VSS and O2 sat 96% on RA.  Denies any pain today.

## 2014-04-22 NOTE — Progress Notes (Signed)
Radiation Oncology         [phone]) [phone] ________________________________  Name: Jodi Frazier MRN: [id number]  Date: 04/22/2014  DOB: 1931/06/14  Follow-Up Visit Note  Outpatient  CC: Leeanne Rio, PA-C  Gaye Pollack, MD  Diagnosis and Prior Radiotherapy:    ICD-9-CM ICD-10-CM  1. Primary malignant neoplasm of left lower lobe of lung 162.5 C34.32     Left lower lung poorly differentiated adenocarcinoma, [id number] STAGE IIA, with new right upper lung nodule  TREATMENT DATES: 08-20-13 to 08-23-13  SITE/DOSE: right upper lung nodule / 54 Gy in 3 fractions  ALSO- s/p external radiation to 70 Gy in 35 fractions through 11/20/12 (she opted NOT to receive chemotherapy)    Narrative:  The patient returns today for routine follow-up.  She is traveling by W/C, but typically is ambulatory. Reports slight increase in SOB with exertion (subtle change). VSS and O2 sat 96% on RA. Denies any pain today.  Overwhelmed by appointments for other medical issues (ie recent acute right cerebellar stroke). Also, vertigo (chronic) and UTI. No new cough. Gaining wt, eating well.                             ALLERGIES:  is allergic to codeine; augmentin; and penicillins.  Meds: Current Outpatient Prescriptions  Medication Sig Dispense Refill  . acetaminophen (TYLENOL) 325 MG tablet Take 650 mg by mouth every 6 (six) hours as needed (pain).       Marland Kitchen albuterol (PROVENTIL HFA;VENTOLIN HFA) 108 (90 BASE) MCG/ACT inhaler Inhale 2 puffs into the lungs every 6 (six) hours as needed for wheezing.  1 Inhaler  3  . ALPRAZolam (XANAX) 0.25 MG tablet Take 1 tablet (0.25 mg total) by mouth 2 (two) times daily as needed for anxiety.  30 tablet  0  . amitriptyline (ELAVIL) 25 MG tablet TAKE ONE TABLET BY MOUTH AT BEDTIME  90 tablet  0  . amLODipine (NORVASC) 2.5 MG tablet Take 1 tablet (2.5 mg total) by mouth daily.  90 tablet  0  . BIOTIN PO Take 1 tablet by mouth daily.       . carvedilol (COREG) 6.25 MG  tablet Take 6.25 mg by mouth 2 (two) times daily with a meal.      . cephALEXin (KEFLEX) 500 MG capsule Take 1 capsule (500 mg total) by mouth daily. Maintenance for UTI  90 capsule  0  . clopidogrel (PLAVIX) 75 MG tablet Take 1 tablet (75 mg total) by mouth daily.  90 tablet  1  . cyanocobalamin (,VITAMIN B-12,) 1000 MCG/ML injection Inject 1,000 mcg into the muscle every 30 (thirty) days. Last dose was on 01-29-14      . cycloSPORINE (RESTASIS) 0.05 % ophthalmic emulsion Place 1 drop into both eyes 2 (two) times daily.  0.4 mL  0  . donepezil (ARICEPT) 10 MG tablet Take 1.5 tablets (15 mg total) by mouth at bedtime. Takes 1 and 1/2  135 tablet  0  . escitalopram (LEXAPRO) 10 MG tablet TAKE 1 TABLET BY MOUTH DAILY  90 tablet  1  . ezetimibe (ZETIA) 10 MG tablet Take 1 tablet (10 mg total) by mouth daily.  90 tablet  1  . Fluticasone Furoate-Vilanterol (BREO ELLIPTA) 100-25 MCG/INH AEPB Inhale 1 puff into the lungs daily.  3 each  0  . mesalamine (LIALDA) 1.2 G EC tablet Take 1.2 g by mouth 3 (three) times daily as needed.       Marland Kitchen  midodrine (PROAMATINE) 2.5 MG tablet TAKE 1 TABLET BY MOUTH TWICE DAILY WITH A MEAL  180 tablet  1  . naproxen sodium (ANAPROX) 220 MG tablet Take 220 mg by mouth daily as needed.      . nitroGLYCERIN (NITROSTAT) 0.4 MG SL tablet Place 0.4 mg under the tongue every 5 (five) minutes as needed for chest pain.       . rosuvastatin (CRESTOR) 5 MG tablet Take 1 tablet (5 mg total) by mouth daily at 6 PM.  90 tablet  1  . SYNTHROID 125 MCG tablet TAKE 1 TABLET (125 MCG TOTAL) BY MOUTH DAILY BEFORE BREAKFAST.  90 tablet  1  . pantoprazole (PROTONIX) 40 MG tablet Take 40 mg by mouth daily.       No current facility-administered medications for this encounter.    Physical Findings: The patient is in no acute distress. Patient is alert and oriented.  height is 5\' 11"  (1.803 m) and weight is 186 lb 9.6 oz (84.641 kg). Her temperature is 97.5 F (36.4 C). Her blood pressure is  132/46 and her pulse is 57. Her oxygen saturation is 98%. .    General: Alert and oriented, in no acute distress HEENT: Head is normocephalic. Heart: Regular in rate and rhythm with no murmurs, rubs, or gallops. Chest: Clear to auscultation bilaterally, with no rhonchi, wheezes, or rales. Neurologic: Cranial nerves II through XII are grossly intact. Speech is fluent. Psychiatric: Judgment and insight are intact. Affect is appropriate.    Lab Findings: Lab Results  Component Value Date   WBC 7.1 02/09/2014   HGB 12.4 02/09/2014   HCT 39.8 02/09/2014   MCV 84.3 02/09/2014   PLT 294 02/09/2014    Radiographic Findings: CT CHEST 10-23  CLINICAL DATA: Adenocarcinoma of the lung diagnosed in February  2014, post radiation therapy in 2014, which shortness of breath,  former smoker, personal history of hypertension, coronary artery  disease post MI, COPD  EXAM:  CT CHEST WITH CONTRAST  TECHNIQUE:  Multidetector CT imaging of the chest was performed during  intravenous contrast administration. Sagittal and coronal MPR images  reconstructed from axial data set.  CONTRAST: 11mL OMNIPAQUE IOHEXOL 300 MG/ML SOLN  COMPARISON: 01/06/2014  FINDINGS:  Scattered atherosclerotic calcifications aorta and coronary  arteries.  Upper normal caliber ascending thoracic aorta 3.6 x 3.5 cm image 29.  Small BILATERAL renal cysts.  Remaining visualized portions of upper abdomen unremarkable.  Thoracic adenopathy including 12 mm short axis precarinal node image  23, RIGHT suprahilar node 13 mm short axis image 23, and RIGHT hilar  node 12 mm short axis image 25.  Additional small normal sized RIGHT paratracheal and pretracheal  lymph nodes noted.  New area of opacity in the medial LEFT upper lobe anteriorly, 2.7 x  1.3 x 1.6 cm image 17, consistent with an mass of either LEFT upper  lobe or pleural origin.  In addition, numerous small anterior LEFT upper lobe nodules  concerning for tumor as well.    Paramediastinal changes of radiation therapy again identified.  LEFT infrahilar mass in medial LEFT lower lobe 1.7 x 1.3 cm  previously 1.7 x 1.6 cm.  Scattered chronic changes in RIGHT lung with multiple new small  RIGHT lower lobe nodules.  No acute infiltrate.  Persistent small LEFT pleural effusion and minimal adjacent  atelectasis.  Bones appear demineralized without definite osseous metastasis.  IMPRESSION:  Post radiation therapy changes in medial LEFT lung.  Multiple new tiny pulmonary nodules involving RIGHT  lower lobe and  LEFT upper lobe, cannot exclude tumor.  New anteromedial LEFT upper lobe soft tissue mass which could be  pulmonary or pleural in origin, 2.7 x 1.3 x 1.6 cm.  Slight decrease in size of LEFT lower lobe mass since prior exam.  Slight increase in size of precarinal and RIGHT hilar adenopathy.    Impression/Plan:  I reviewed her CT scan with her and her daughter. Suspicious for progressive disease. She is not symptomatic currently. We discussed several options, including:  1) PET scan, then re consultation with Dr Julien Nordmann for potential systemic therapy  2) PET scan, then f/u with me to discuss if there is any role for RT (if adamant against systemic therapy)  3) Observe and only scan if there are concerning symptoms  She is really exhausted by her other medical issues and appointments and is focused on QOL.  She is adamant against chemotherapy / systemic therapy of any kind.  She would like to enjoy the holidays and have a PET at the end of the year.  Sooner, if symptoms develop.  She understands this is not the standard approach, but most fitting of her values.  I will schedule PET at the end of Dec and f/u with me sooner thereafter.  Of course, she or her family may call me at any time.   I spent 25 minutes face to face with the patient and more than 50% of that time was spent in counseling and/or coordination of  care. _____________________________________   Eppie Gibson, MD

## 2014-04-25 ENCOUNTER — Telehealth: Payer: Self-pay | Admitting: *Deleted

## 2014-04-25 NOTE — Telephone Encounter (Signed)
CALLED PATIENT TO INFORM OF PET SCAN FOR 06-28-14 AND HER FU VISIT WITH DR. Isidore Moos ON 07-08-14, SPOKE WITH PATIENT AND SHE IS AWARE OF THESE APPTS.

## 2014-04-28 ENCOUNTER — Other Ambulatory Visit: Payer: Self-pay | Admitting: Physician Assistant

## 2014-05-02 ENCOUNTER — Ambulatory Visit: Payer: Medicare Other | Admitting: Emergency Medicine

## 2014-05-03 ENCOUNTER — Other Ambulatory Visit: Payer: Self-pay | Admitting: Family Medicine

## 2014-05-03 ENCOUNTER — Telehealth: Payer: Self-pay | Admitting: *Deleted

## 2014-05-03 DIAGNOSIS — M6281 Muscle weakness (generalized): Secondary | ICD-10-CM

## 2014-05-03 DIAGNOSIS — J449 Chronic obstructive pulmonary disease, unspecified: Secondary | ICD-10-CM

## 2014-05-03 DIAGNOSIS — I69398 Other sequelae of cerebral infarction: Secondary | ICD-10-CM

## 2014-05-03 DIAGNOSIS — I2510 Atherosclerotic heart disease of native coronary artery without angina pectoris: Secondary | ICD-10-CM

## 2014-05-03 NOTE — Telephone Encounter (Signed)
Received home health certification and plan of care via fax from [facility]. Forms forwarded to Dr. Charlett Blake for signature. JG//CMA

## 2014-05-04 ENCOUNTER — Ambulatory Visit: Payer: Medicare Other | Admitting: Emergency Medicine

## 2014-05-04 ENCOUNTER — Ambulatory Visit (INDEPENDENT_AMBULATORY_CARE_PROVIDER_SITE_OTHER): Payer: Medicare Other | Admitting: Emergency Medicine

## 2014-05-04 ENCOUNTER — Encounter: Payer: Self-pay | Admitting: Emergency Medicine

## 2014-05-04 VITALS — BP 130/60 | HR 56 | Temp 96.8°F | Ht 71.0 in | Wt 189.2 lb

## 2014-05-04 DIAGNOSIS — C3490 Malignant neoplasm of unspecified part of unspecified bronchus or lung: Secondary | ICD-10-CM

## 2014-05-04 DIAGNOSIS — J910 Malignant pleural effusion: Secondary | ICD-10-CM

## 2014-05-04 DIAGNOSIS — J449 Chronic obstructive pulmonary disease, unspecified: Secondary | ICD-10-CM

## 2014-05-04 DIAGNOSIS — I639 Cerebral infarction, unspecified: Secondary | ICD-10-CM

## 2014-05-04 NOTE — Patient Instructions (Signed)
Please continue Breo once a day Keep your albuterol available to use if needed Follow up with Dr. Isidore Moos in December for your PET scan Follow with Dr Lamonte Sakai in 6 months or sooner if you have any problems

## 2014-05-04 NOTE — Assessment & Plan Note (Signed)
There is no benefit to perform thoracentesis at this time. This effusion is stable in size and her lung did not reexpand on her previous thoracentesis

## 2014-05-04 NOTE — Assessment & Plan Note (Signed)
With likely recurrence based on her most recent CT scan of the chest. She has discussed this with Dr. Isidore Moos and it is possible that she may decide to do more radiation therapy. She has decided to defer a PET scan until after the holidays. The scan is ordered for late December. She will discuss with Dr. Isidore Moos after the scan is done.

## 2014-05-04 NOTE — Progress Notes (Signed)
Subjective:    Patient ID: Jodi Frazier, female    DOB: 05/05/1931, 80 y.o.   MRN: [id number]  HPI 80 yo woman, former smoker (40 pk-yrs), HTN, CAD, adenoCA LLL s/p XRT 3-5/'14, COPD, rhinitis, GERD. She is on Advair + albuterol prn. She has had some increased cough and dyspnea since the end of XRT. Her CT scan chest done 02/22/13 shows shrinkage of LLL mass but some associated GGI in the L lung. She was just started on a pred taper by Dr Lanell Persons for this.   ROV 04/08/13 -- hx adenoCA LLL, COPD. She has undergone XRT to LLL lesion, was seen last time with some associated GGI suspicious for radiation pneumonitis. We treated with prednisone, she never tapered to 40mg , has been on 60mg  qd. She is coughing more since last time. Taking advair qd, albuterol prn (2x a week).   ROV 05/04/13 -- hx adenoCA LLL, COPD. She has undergone XRT to LLL lesion, has been rx with pred for suspected radiation pneumonitis / GGI.  At last visit we tapered prednisone to off. We also stopped Advair and start of Breo. We also started protonix and added an allergy regimen for her cough. She has been admitted in the interim for PTX, received more prednisone and is currently tapering. CXR today shows some clearing. She believes that [name] is helping her. She still has nasal gtt.   ROV 06/10/13 --  COPD, hx adenoCA LLL s/p XRT and treated for radiation pneumonitis in 10-05/2013. Hx PTX.Marland Kitchen Also with cough > protonix. ? Loratadine and chlorpheniramine > stopped these. She c/o nausea today, ? Related to increasing the protonic to qd, ? Breo. Her cough appears to better controlled.   Acute OV 11/16/13 -- hx adenoCA LLL s/p XRT and treated for radiation pneumonitis in 10-05/2013. Hx PTX. Also with COPD and allergic rhinitis.  She c/o of dizziness and more dyspnea for the last 7-10 days. She may have some more LE swelling. She just finished 3 XRT to the RUL in March. Has not had and does not want chemo.    ROV 12/14/13 -- hx adenoCA LLL  s/p XRT and treated for radiation pneumonitis in 10-05/2013, followed with Dr Melvyn Novas 5/26 for CP after thora, lung didn't come up completely. The thora didn't change her breathing much at all.   ROV 05/04/14 -- hx adenoCA s/p XRT c/b radiation pneumonitis. Underwent L thora 5/'15 without any changes in her breathing. She had a recent small CVA. She underwent CT chest 10/23 > new areas of nodularity, new LUL soft tissue mass, smaller LLL mass, R hilar adenopathy.  She has spoken with Dr Isidore Moos and is planning for a PET scan - she has deferred this to after the holidays. She is on Breo, seems to be tolerating. She has not needed her SABA except twice ever.  Review of Systems  Constitutional: Negative for fever and unexpected weight change.  HENT: Positive for rhinorrhea. Negative for congestion, dental problem, ear pain, nosebleeds, postnasal drip, sinus pressure, sneezing, sore throat and trouble swallowing.   Eyes: Negative for redness and itching.  Respiratory: Positive for cough and shortness of breath. Negative for chest tightness and wheezing.   Cardiovascular: Negative for palpitations and leg swelling.  Gastrointestinal: Negative for nausea and vomiting.  Genitourinary: Negative for dysuria.  Musculoskeletal: Negative for joint swelling.  Skin: Negative for rash.  Neurological: Positive for light-headedness. Negative for headaches.  Hematological: Does not bruise/bleed easily.  Psychiatric/Behavioral: Negative for dysphoric mood. The patient is not nervous/anxious.      Filed Vitals:   05/04/14 1428  BP: 130/60  Pulse: 56  Temp: 96.8 F (36 C)  TempSrc: Oral  Height: 5\' 11"  (1.803 m)  Weight: 189 lb 3.2 oz (85.821 kg)  SpO2: 98%    Gen: Pleasant, well-nourished, in no distress,  normal affect  ENT: No lesions,   mouth clear,  oropharynx clear, no postnasal drip  Neck: No JVD, no TMG, no carotid bruits  Lungs: No use of accessory muscles, no dullness to percussion, clear without rales or rhonchi  Cardiovascular: RRR, heart sounds normal, no murmur or gallops, no peripheral edema  Musculoskeletal: No deformities, no cyanosis or clubbing  Neuro: alert, non focal  Skin: Warm, no lesions or rashes     Objective:   Physical Exam Filed Vitals:   05/04/14 1428  BP: 130/60  Pulse: 56  Temp: 96.8 F (36 C)  TempSrc: Oral  Height: 5\' 11"  (1.803 m)  Weight: 189 lb 3.2 oz (85.821 kg)  SpO2: 98%   04/22/14 --  COMPARISON: 01/06/2014  FINDINGS: Scattered atherosclerotic calcifications aorta and coronary arteries.  Upper normal caliber ascending thoracic aorta 3.6 x 3.5 cm image 29.  Small BILATERAL renal cysts.  Remaining visualized portions of upper abdomen unremarkable.  Thoracic adenopathy including 12 mm short axis precarinal node image 23, RIGHT suprahilar node 13 mm short axis image 23, and RIGHT hilar node 12 mm short axis image 25.  Additional small normal sized RIGHT paratracheal and pretracheal lymph nodes noted.  New area of opacity in the medial LEFT upper lobe anteriorly, 2.7 x 1.3 x 1.6 cm image 17, consistent with an mass of either LEFT upper lobe or pleural origin.  In addition, numerous small anterior LEFT upper lobe nodules concerning for tumor as well.  Paramediastinal changes of radiation therapy again identified.  LEFT infrahilar mass in medial LEFT lower lobe 1.7 x 1.3 cm previously 1.7 x 1.6 cm.  Scattered chronic changes in RIGHT lung with multiple new small RIGHT lower lobe nodules.  No acute infiltrate.  Persistent small LEFT pleural effusion and minimal adjacent atelectasis.  Bones appear demineralized without definite osseous metastasis.  IMPRESSION: Post radiation therapy changes in medial LEFT lung.  Multiple new tiny  pulmonary nodules involving RIGHT lower lobe and LEFT upper lobe, cannot exclude tumor.  New anteromedial LEFT upper lobe soft tissue mass which could be pulmonary or pleural in origin, 2.7 x 1.3 x 1.6 cm.  Slight decrease in size of LEFT lower lobe mass since prior exam.  Slight increase in size of precarinal and RIGHT hilar adenopathy.      Assessment & Plan:  Adenocarcinoma of lung With likely recurrence based on her most recent CT scan of the chest. She has discussed this with Dr. Isidore Moos and it is possible that she may decide to  do more radiation therapy. She has decided to defer a PET scan until after the holidays. The scan is ordered for late December. She will discuss with Dr. Isidore Moos after the scan is done.   Malignant pleural effusion There is no benefit to perform thoracentesis at this time. This effusion is stable in size and her lung did not reexpand on her previous thoracentesis  COPD (chronic obstructive pulmonary disease) We will continue Breo as ordered She will use albuterol when necessary

## 2014-05-04 NOTE — Telephone Encounter (Signed)
Signed forms faxed to [facility]. JG//CMA

## 2014-05-04 NOTE — Assessment & Plan Note (Signed)
We will continue Breo as ordered She will use albuterol when necessary

## 2014-05-16 ENCOUNTER — Telehealth: Payer: Self-pay | Admitting: *Deleted

## 2014-05-16 NOTE — Telephone Encounter (Signed)
Received home health certification and plan of care via fax from [facility]. Forms forwarded to [facility]. JG//CMA

## 2014-05-17 NOTE — Telephone Encounter (Signed)
Signed forms faxed to [facility]. JG//CMA

## 2014-05-20 ENCOUNTER — Other Ambulatory Visit: Payer: Self-pay | Admitting: Physician Assistant

## 2014-05-20 NOTE — Telephone Encounter (Signed)
Med filled.  

## 2014-06-01 ENCOUNTER — Telehealth: Payer: Self-pay | Admitting: Physician Assistant

## 2014-06-01 NOTE — Telephone Encounter (Signed)
Caller name: lou Relation to OZ:[id number] Call back number: [phone] Pharmacy:  Reason for call:   Patient daughter states that patient has another kidney infection and wants to know if Einar Pheasant wants to treat this or should patient go back to urologist.

## 2014-06-01 NOTE — Telephone Encounter (Signed)
I will need a urine sample before I will treat without an office visit.  She can also call the urologist.

## 2014-06-03 NOTE — Telephone Encounter (Signed)
pts daughter to call back to decide what they want to do. ua sample or contact their urologist.

## 2014-06-07 ENCOUNTER — Other Ambulatory Visit (INDEPENDENT_AMBULATORY_CARE_PROVIDER_SITE_OTHER): Payer: Medicare Other

## 2014-06-07 ENCOUNTER — Telehealth: Payer: Self-pay

## 2014-06-07 ENCOUNTER — Other Ambulatory Visit: Payer: Self-pay | Admitting: Physician Assistant

## 2014-06-07 DIAGNOSIS — R300 Dysuria: Secondary | ICD-10-CM

## 2014-06-07 NOTE — Telephone Encounter (Signed)
Silvestre Gunner Daughter [phone]  Jodi Frazier was calling to see if the urine test had resulted yet. Please call her back at above number.

## 2014-06-08 ENCOUNTER — Ambulatory Visit (INDEPENDENT_AMBULATORY_CARE_PROVIDER_SITE_OTHER): Payer: Medicare Other | Admitting: Physician Assistant

## 2014-06-08 ENCOUNTER — Encounter: Payer: Self-pay | Admitting: Physician Assistant

## 2014-06-08 VITALS — BP 136/63 | HR 100 | Temp 98.0°F | Wt 188.0 lb

## 2014-06-08 DIAGNOSIS — N3289 Other specified disorders of bladder: Secondary | ICD-10-CM

## 2014-06-08 DIAGNOSIS — I639 Cerebral infarction, unspecified: Secondary | ICD-10-CM

## 2014-06-08 DIAGNOSIS — R3989 Other symptoms and signs involving the genitourinary system: Secondary | ICD-10-CM

## 2014-06-08 LAB — URINALYSIS, ROUTINE W REFLEX MICROSCOPIC
BILIRUBIN URINE: NEGATIVE
HGB URINE DIPSTICK: NEGATIVE
KETONES UR: NEGATIVE
LEUKOCYTES UA: NEGATIVE
Nitrite: NEGATIVE
Specific Gravity, Urine: 1.01 (ref 1.000–1.030)
Total Protein, Urine: NEGATIVE
UROBILINOGEN UA: 0.2 (ref 0.0–1.0)
Urine Glucose: NEGATIVE
pH: 6 (ref 5.0–8.0)

## 2014-06-08 LAB — CULTURE, URINE COMPREHENSIVE
Colony Count: NO GROWTH
Organism ID, Bacteria: NO GROWTH

## 2014-06-08 LAB — POCT URINALYSIS DIPSTICK
BILIRUBIN UA: NEGATIVE
Blood, UA: NEGATIVE
GLUCOSE UA: NEGATIVE
KETONES UA: NEGATIVE
Leukocytes, UA: NEGATIVE
Nitrite, UA: NEGATIVE
Protein, UA: NEGATIVE
Spec Grav, UA: 1.015
Urobilinogen, UA: 0.2
pH, UA: 6

## 2014-06-08 MED ORDER — NITROGLYCERIN 0.4 MG SL SUBL: 0.4000 mg | SUBLINGUAL_TABLET | SUBLINGUAL | Status: DC | PRN

## 2014-06-08 NOTE — Patient Instructions (Signed)
Please increase your fluid intake.  Rest.  Use Plain Mucinex to break up the congestion.  Continue the Albuterol inhaler as directed. Place a humidifier in the bedroom.  You urine looks good in clinic.  I will call you with your culture results.  If positive, we will start antibiotic therapy.  Call or return to clinic if symptoms are not improving.  Viral Infections A virus is a type of germ. Viruses can cause:  Minor sore throats.  Aches and pains.  Headaches.  Runny nose.  Rashes.  Watery eyes.  Tiredness.  Coughs.  Loss of appetite.  Feeling sick to your stomach (nausea).  Throwing up (vomiting).  Watery poop (diarrhea). HOME CARE   Only take medicines as told by your doctor.  Drink enough water and fluids to keep your pee (urine) clear or pale yellow. Sports drinks are a good choice.  Get plenty of rest and eat healthy. Soups and broths with crackers or rice are fine. GET HELP RIGHT AWAY IF:   You have a very bad headache.  You have shortness of breath.  You have chest pain or neck pain.  You have an unusual rash.  You cannot stop throwing up.  You have watery poop that does not stop.  You cannot keep fluids down.  You or your child has a temperature by mouth above 102 F (38.9 C), not controlled by medicine.  Your baby is older than 3 months with a rectal temperature of 102 F (38.9 C) or higher.  Your baby is 65 months old or younger with a rectal temperature of 100.4 F (38 C) or higher. MAKE SURE YOU:   Understand these instructions.  Will watch this condition.  Will get help right away if you are not doing well or get worse. Document Released: 05/30/2008 Document Revised: 09/09/2011 Document Reviewed: 10/23/2010 [facility] Patient Information 2015 [place], [place]. This information is not intended to replace advice given to you by your health care provider. Make sure you discuss any questions you have with your health care provider.

## 2014-06-08 NOTE — Progress Notes (Signed)
Pre visit review using our clinic review tool, if applicable. No additional management support is needed unless otherwise documented below in the visit note. 

## 2014-06-10 NOTE — Telephone Encounter (Signed)
Pt notified. Done.

## 2014-06-13 NOTE — Progress Notes (Signed)
Patient presents to clinic today c/o mild fatigue and malaise over the past couple of days associated with mild bladder pressure.  Patient denies fever, chills, URI symptoms, flank pain, urinary urgency, frequency or dysuria.  Is currently on Keflex daily to prevent UTI. Has been taking other medications as directed.  Past Medical History  Diagnosis Date  . Hypertension   . Colitis   . Coronary artery disease   . High cholesterol     takes Red Yeast Rice and Fish OIl daily  . Kidney stones   . PONV (postoperative nausea and vomiting)   . MI (myocardial infarction) 2010  . Shortness of breath     with exertion  . Pneumonia     hx of 20+yrs ago  . COPD (chronic obstructive pulmonary disease)     pleurisy or COPD exascerbation > 49yr ago  . Headache(784.0)   . Vertigo     HTN related bc gets up too fast  . Arthritis   . Bruises easily   . History of shingles 75yrs ago    in eye;occasionally gets some residual   . Accessory skin tags     arms/legs  . GERD (gastroesophageal reflux disease)   . Colitis   . Diverticulosis   . Urinary frequency   . Stress incontinence   . History of kidney stones   . Hypothyroidism     takes sYnthroid daily  . Cataract     early stage on right  . Macular degeneration, wet   . Depression     some but takes Amitryptylline nightly  . Right-sided chest wall pain 08/28/12    PRESENTATION - RIGHT SIDED ANTERIOR CHEST PAIN  . Weight loss 08/28/12    10 LB WEIGHT LOSS OVER 3 MONTHS  . Lung mass 08/12/12    CHEST-XRAY/ PET -LOBULAR MASS LLL - 4.1 X 3.6 X 4.2 CM  . Dementia     mild  . S/P radiation therapy  10/05/2012-11/20/2012    Left Lower Lung and hilum / 70 Gy in 35 fractions  . Hypertension   . At risk for fall due to comorbid condition     5 falls - last Early December  . Vertigo   . Orthostatic hypotension   . S/P radiation therapy 2/13, 2/20, 2/23 (2015)    SBRT to Right Lung  . Adenocarcinoma of lung 09/07/12    needle core  bx-LLL-adenocarcinoma    Current Outpatient Prescriptions on File Prior to Visit  Medication Sig Dispense Refill  . albuterol (PROVENTIL HFA;VENTOLIN HFA) 108 (90 BASE) MCG/ACT inhaler Inhale 2 puffs into the lungs every 6 (six) hours as needed for wheezing. 1 Inhaler 3  . ALPRAZolam (XANAX) 0.25 MG tablet Take 1 tablet (0.25 mg total) by mouth 2 (two) times daily as needed for anxiety. 30 tablet 0  . amitriptyline (ELAVIL) 25 MG tablet TAKE ONE TABLET BY MOUTH AT BEDTIME 90 tablet 0  . amLODipine (NORVASC) 2.5 MG tablet TAKE 1 TABLET BY MOUTH DAILY 90 tablet 1  . BIOTIN PO Take 1 tablet by mouth daily.     . carvedilol (COREG) 6.25 MG tablet TAKE 1 TABLET BY MOUTH 2 TIMES A DAY WITH A MEAL 180 tablet 1  . cephALEXin (KEFLEX) 500 MG capsule Take 1 capsule (500 mg total) by mouth daily. Maintenance for UTI 90 capsule 0  . clopidogrel (PLAVIX) 75 MG tablet Take 1 tablet (75 mg total) by mouth daily. 90 tablet 1  . cyanocobalamin (,VITAMIN B-12,)  1000 MCG/ML injection Inject 1,000 mcg into the muscle every 30 (thirty) days. Last dose was on 01-29-14    . cycloSPORINE (RESTASIS) 0.05 % ophthalmic emulsion Place 1 drop into both eyes 2 (two) times daily. 0.4 mL 0  . donepezil (ARICEPT) 10 MG tablet Take 1.5 tablets (15 mg total) by mouth at bedtime. Takes 1 and 1/2 135 tablet 0  . escitalopram (LEXAPRO) 10 MG tablet TAKE 1 TABLET BY MOUTH DAILY 90 tablet 1  . ezetimibe (ZETIA) 10 MG tablet Take 1 tablet (10 mg total) by mouth daily. 90 tablet 1  . Fluticasone Furoate-Vilanterol (BREO ELLIPTA) 100-25 MCG/INH AEPB Inhale 1 puff into the lungs daily. 3 each 0  . midodrine (PROAMATINE) 2.5 MG tablet TAKE 1 TABLET BY MOUTH TWICE DAILY WITH A MEAL 180 tablet 1  . naproxen sodium (ANAPROX) 220 MG tablet Take 220 mg by mouth daily as needed.    . pantoprazole (PROTONIX) 40 MG tablet TAKE 1 TABLET (40 MG) BY MOUTH EVERY MORNING. 90 tablet 1  . rosuvastatin (CRESTOR) 5 MG tablet Take 1 tablet (5 mg total) by  mouth daily at 6 PM. 90 tablet 1  . SYNTHROID 125 MCG tablet TAKE 1 TABLET (125 MCG TOTAL) BY MOUTH DAILY BEFORE BREAKFAST. 90 tablet 1   No current facility-administered medications on file prior to visit.    Allergies  Allergen Reactions  . Codeine Hives  . Augmentin [Amoxicillin-Pot Clavulanate] Diarrhea and Nausea And Vomiting  . Penicillins Hives    Family History  Problem Relation Age of Onset  . Anesthesia problems Mother   . Hypotension Neg Hx   . Malignant hyperthermia Neg Hx   . Pseudochol deficiency Neg Hx   . Cancer Maternal Aunt     breast    History   Social History  . Marital Status: Widowed    Spouse Name: N/A    Number of Children: 57  . Years of Education: college   Occupational History  . retired     Press photographer   Social History Main Topics  . Smoking status: Former Smoker -- 1.00 packs/day for 40 years    Types: Cigarettes    Quit date: 08/26/2002  . Smokeless tobacco: Never Used  . Alcohol Use: Yes     Comment: 2 times week alcohol  . Drug Use: No  . Sexual Activity: No   Other Topics Concern  . None   Social History Narrative   Daughter is nurse in neuro ICU   Patient lives at home alone   Patient left handed    Patient drinks coffee daily   Review of Systems - See HPI.  All other ROS are negative.  BP 136/63 mmHg  Pulse 100  Temp(Src) 98 F (36.7 C)  Wt 188 lb (85.276 kg)  SpO2 96%  Physical Exam  Constitutional: She is oriented to person, place, and time and well-developed, well-nourished, and in no distress.  HENT:  Head: Normocephalic and atraumatic.  Eyes: Conjunctivae are normal.  Neck: Neck supple.  Cardiovascular: Normal rate, regular rhythm, normal heart sounds and intact distal pulses.   Pulmonary/Chest: Effort normal and breath sounds normal. No respiratory distress. She has no wheezes. She has no rales. She exhibits no tenderness.  Abdominal: Soft. Bowel sounds are normal. She exhibits no distension and no mass.  There is no tenderness. There is no rebound and no guarding.  Negative CVA tenderness  Neurological: She is alert and oriented to person, place, and time.  Skin: Skin is  warm and dry. No rash noted.  Psychiatric: Affect normal.  Vitals reviewed.   Recent Results (from the past 2160 hour(s))  BUN and Creatinine     Status: None   Collection Time: 04/22/14 10:21 AM  Result Value Ref Range   BUN 15.6 7.0 - 26.0 mg/dL   Creatinine 1.0 0.6 - 1.1 mg/dL  Urinalysis, Routine w reflex microscopic     Status: None   Collection Time: 06/07/14  5:14 PM  Result Value Ref Range   Color, Urine YELLOW Yellow;Lt. Yellow   APPearance CLEAR Clear   Specific Gravity, Urine 1.010 1.000-1.030   pH 6.0 5.0 - 8.0   Total Protein, Urine NEGATIVE Negative   Urine Glucose NEGATIVE Negative   Ketones, ur NEGATIVE Negative   Bilirubin Urine NEGATIVE Negative   Hgb urine dipstick NEGATIVE Negative   Urobilinogen, UA 0.2 0.0 - 1.0   Leukocytes, UA NEGATIVE Negative   Nitrite NEGATIVE Negative   WBC, UA 0-2/hpf 0-2/hpf   RBC / HPF 0-2/hpf 0-2/hpf   Squamous Epithelial / LPF Rare(0-4/hpf) Rare(0-4/hpf)  CULTURE, URINE COMPREHENSIVE     Status: None   Collection Time: 06/07/14  5:14 PM  Result Value Ref Range   Colony Count NO GROWTH    Organism ID, Bacteria NO GROWTH   POCT Urinalysis Dipstick     Status: None   Collection Time: 06/08/14  3:18 PM  Result Value Ref Range   Color, UA yellow    Clarity, UA clear    Glucose, UA neg    Bilirubin, UA neg    Ketones, UA neg    Spec Grav, UA 1.015    Blood, UA neg    pH, UA 6.0    Protein, UA neg    Urobilinogen, UA 0.2    Nitrite, UA neg    Leukocytes, UA Negative     Assessment/Plan: Sensation of pressure in bladder area Urine dip unremarkable.  Will send for culture. Continue prophylactic Keflex daily. Increase fluids.  Resume probiotic.  Will treat based on results.  Follow-up in 2-3 days if symptoms have not resolved.

## 2014-06-13 NOTE — Assessment & Plan Note (Signed)
Urine dip unremarkable.  Will send for culture. Continue prophylactic Keflex daily. Increase fluids.  Resume probiotic.  Will treat based on results.  Follow-up in 2-3 days if symptoms have not resolved.

## 2014-06-15 ENCOUNTER — Other Ambulatory Visit: Payer: Self-pay | Admitting: Family

## 2014-06-15 NOTE — Telephone Encounter (Signed)
Amitriptyline authorization sent, #90 x no refills with note not to fill Rx before 07/04/14 as pt should not be out yet. Gave verbal to [employer] at [facility] as well.

## 2014-06-28 ENCOUNTER — Encounter (HOSPITAL_COMMUNITY): Payer: Self-pay

## 2014-06-28 ENCOUNTER — Ambulatory Visit (HOSPITAL_COMMUNITY)
Admission: RE | Admit: 2014-06-28 | Discharge: 2014-06-28 | Disposition: A | Discharge status: Home / Self Care | Payer: Medicare Other | Source: Ambulatory Visit | Attending: Radiation Oncology | Admitting: Radiation Oncology

## 2014-06-28 DIAGNOSIS — C3432 Malignant neoplasm of lower lobe, left bronchus or lung: Secondary | ICD-10-CM | POA: Diagnosis not present

## 2014-06-28 DIAGNOSIS — Z79899 Other long term (current) drug therapy: Secondary | ICD-10-CM | POA: Diagnosis not present

## 2014-06-28 LAB — GLUCOSE, CAPILLARY: Glucose-Capillary: 95 mg/dL (ref 70–99)

## 2014-06-28 MED ORDER — FLUDEOXYGLUCOSE F - 18 (FDG) INJECTION
9.9000 | Freq: Once | INTRAVENOUS | Status: AC | PRN
  Administered 2014-06-28: 9.9 via INTRAVENOUS

## 2014-07-04 ENCOUNTER — Other Ambulatory Visit: Payer: Self-pay | Admitting: Physician Assistant

## 2014-07-04 NOTE — Telephone Encounter (Signed)
Medication Detail      Disp Refills Start End     cephALEXin (KEFLEX) 500 MG capsule 90 capsule 0 03/29/2014     Sig - Route: Take 1 capsule (500 mg total) by mouth daily. Maintenance for UTI - Oral    E-Prescribing Status: Receipt confirmed by pharmacy (03/29/2014 1:49 PM EDT)     Associated Diagnoses    History of recurrent UTIs

## 2014-07-08 ENCOUNTER — Ambulatory Visit
Admission: RE | Admit: 2014-07-08 | Discharge: 2014-07-08 | Disposition: A | Discharge status: Home / Self Care | Payer: Medicare Other | Source: Ambulatory Visit | Attending: Radiation Oncology | Admitting: Radiation Oncology

## 2014-07-08 ENCOUNTER — Encounter: Payer: Self-pay | Admitting: Radiation Oncology

## 2014-07-08 VITALS — BP 144/54 | HR 53 | Temp 97.3°F | Resp 20 | Wt 189.0 lb

## 2014-07-08 DIAGNOSIS — C3432 Malignant neoplasm of lower lobe, left bronchus or lung: Secondary | ICD-10-CM

## 2014-07-08 NOTE — Progress Notes (Signed)
Patient reports pain "where I've had cancer", left lower lung, right upper lung. She states she "has a new place, and that's why I'm here today". She has occasional dry to productive cough with white to yellow sputum. SOB with ADL's, does not use O 2 at home. O 2 sat today 98%. She is fatigued, denies loss of appetite.

## 2014-07-08 NOTE — Progress Notes (Signed)
Radiation Oncology         [phone] ________________________________  Name: Jodi Frazier MRN: [id number]  Date: 07/08/2014  DOB: 09/02/1930  Follow-Up Visit Note  Outpatient  CC: Leeanne Rio, PA-C  Brunetta Jeans, PA-C  Diagnosis and Prior Radiotherapy:    ICD-9-CM ICD-10-CM   1. Primary malignant neoplasm of left lower lobe of lung 162.5 C34.32      Left lower lung poorly differentiated adenocarcinoma, [id number] STAGE IIA, with new right upper lung nodule  TREATMENT DATES: 08-20-13 to 08-23-13  SITE/DOSE: right upper lung nodule / 54 Gy in 3 fractions  ALSO- s/p external radiation to 70 Gy in 35 fractions through 11/20/12 (she opted NOT to receive chemotherapy)    Narrative:  The patient returns today for routine follow-up.   Patient reports occasional pain in a point lateral to her left sternum. It is not severe and not accompanied but other symptoms.  She has occasional dry to productive cough with white to yellow sputum. SOB with ADL's, does not use O 2 at home. O 2 sat today 98%. She is fatigued, denies loss of appetite. Here to review PET results.   ALLERGIES:  is allergic to codeine; augmentin; and penicillins.  Meds: Current Outpatient Prescriptions  Medication Sig Dispense Refill  . albuterol (PROVENTIL HFA;VENTOLIN HFA) 108 (90 BASE) MCG/ACT inhaler Inhale 2 puffs into the lungs every 6 (six) hours as needed for wheezing. 1 Inhaler 3  . ALPRAZolam (XANAX) 0.25 MG tablet Take 1 tablet (0.25 mg total) by mouth 2 (two) times daily as needed for anxiety. 30 tablet 0  . amitriptyline (ELAVIL) 25 MG tablet TAKE ONE TABLET BY MOUTH AT BEDTIME 90 tablet 0  . amLODipine (NORVASC) 2.5 MG tablet TAKE 1 TABLET BY MOUTH DAILY 90 tablet 1  . BIOTIN PO Take 1 tablet by mouth daily.     . carvedilol (COREG) 6.25 MG tablet TAKE 1 TABLET BY MOUTH 2 TIMES A DAY WITH A MEAL 180 tablet 1  . cephALEXin (KEFLEX) 500 MG capsule TAKE 1 CAPSULE (500 MG TOTAL) BY MOUTH DAILY.  MAINTENANCE FOR UTI 90 capsule 0  . clopidogrel (PLAVIX) 75 MG tablet Take 1 tablet (75 mg total) by mouth daily. 90 tablet 1  . cyanocobalamin (,VITAMIN B-12,) 1000 MCG/ML injection Inject 1,000 mcg into the muscle every 30 (thirty) days. Last dose was on 01-29-14    . cycloSPORINE (RESTASIS) 0.05 % ophthalmic emulsion Place 1 drop into both eyes 2 (two) times daily. 0.4 mL 0  . donepezil (ARICEPT) 10 MG tablet Take 1.5 tablets (15 mg total) by mouth at bedtime. Takes 1 and 1/2 135 tablet 0  . escitalopram (LEXAPRO) 10 MG tablet TAKE 1 TABLET BY MOUTH DAILY 90 tablet 1  . ezetimibe (ZETIA) 10 MG tablet Take 1 tablet (10 mg total) by mouth daily. 90 tablet 1  . Fluticasone Furoate-Vilanterol (BREO ELLIPTA) 100-25 MCG/INH AEPB Inhale 1 puff into the lungs daily. 3 each 0  . midodrine (PROAMATINE) 2.5 MG tablet TAKE 1 TABLET BY MOUTH TWICE DAILY WITH A MEAL 180 tablet 1  . naproxen sodium (ANAPROX) 220 MG tablet Take 220 mg by mouth daily as needed.    . nitroGLYCERIN (NITROSTAT) 0.4 MG SL tablet Place 1 tablet (0.4 mg total) under the tongue every 5 (five) minutes as needed for chest pain. 30 tablet 1  . pantoprazole (PROTONIX) 40 MG tablet TAKE 1 TABLET (40 MG) BY MOUTH EVERY MORNING. 90 tablet 1  . rosuvastatin (CRESTOR) 5  MG tablet Take 1 tablet (5 mg total) by mouth daily at 6 PM. 90 tablet 1  . SYNTHROID 125 MCG tablet TAKE 1 TABLET (125 MCG TOTAL) BY MOUTH DAILY BEFORE BREAKFAST. 90 tablet 1   No current facility-administered medications for this encounter.    Physical Findings: The patient is in no acute distress. Patient is alert and oriented.  weight is 189 lb (85.73 kg). Her oral temperature is 97.3 F (36.3 C). Her blood pressure is 144/54 and her pulse is 53. Her respiration is 20 and oxygen saturation is 97%. .    General: Alert and oriented, in no acute distress HEENT: Head is normocephalic. Heart: Regular in rate and rhythm with no murmurs, rubs, or gallops. Chest: Clear to  auscultation bilaterally, with no rhonchi, wheezes, or rales. Neurologic: Cranial nerves II through XII are grossly intact. Speech is fluent. Psychiatric: Judgment and insight are intact. Affect is appropriate.    Lab Findings: Lab Results  Component Value Date   WBC 7.1 02/09/2014   HGB 12.4 02/09/2014   HCT 39.8 02/09/2014   MCV 84.3 02/09/2014   PLT 294 02/09/2014    Radiographic Findings: PET 06/28/14 CLINICAL DATA: Subsequent treatment strategy for left lower lobe primary bronchogenic carcinoma. Restaging.  EXAM: NUCLEAR MEDICINE PET SKULL BASE TO THIGH  TECHNIQUE: 9.9 mCi F-18 FDG was injected intravenously. Full-ring PET imaging was performed from the skull base to thigh after the radiotracer. CT data was obtained and used for attenuation correction and anatomic localization.  FASTING BLOOD GLUCOSE: Value: 95 mg/dl  COMPARISON: PET of 10/12/2013. Chest CT of 04/22/2014. Abdominal MRI of 11/12/2013.  FINDINGS: NECK  Diffuse hypermetabolism involving the thyroid gland. This was present on the prior exam. This measures a S.U.V. max of 10.3. No dominant focal lesion identified.  CHEST  Hypermetabolism corresponding to left upper lobe pulmonary nodules. Dominant pleural-based nodule measures 1.9 cm and a S.U.V. max of 6.1 on image 20. Other smaller nodules, including a 9 mm nodule which measures a S.U.V. max of 8.7 on image 27.  An anterior right lung base nodule measures 6 mm and a S.U.V. max of 1.8 on image 45 of series 8. This is suspicious.  Hypermetabolic mediastinal nodes are new since the prior PET. A precarinal node measures 12 mm and a S.U.V. max of 5.2 on image 69. Similar in size on 04/22/2014. Probable right hilar nodal metastasis with hypermetabolism measuring a S.U.V. max of 4.2 on approximately image 75.  Left perihilar hypermetabolism corresponding to pulmonary parenchymal soft tissue thickening. This is likely  radiation induced, measuring a S.U.V. max of 5.3  ABDOMEN/PELVIS  Subcutaneous hypermetabolic nodules superficial the gluteal muscles. Index right sided focus measures a S.U.V. max of 1.9. Likely related to injection sites.  SKELETON  No abnormal marrow activity.  CT IMAGES PERFORMED FOR ATTENUATION CORRECTION  Cerebral atrophy with resultant prominence of the extra-axial spaces adjacent the frontal lobes. There is also cerebellar atrophy.  Cardiomegaly with coronary artery atherosclerosis. Small left pleural effusion is similar. Mild renal atrophy bilaterally. Suspect small stones in the right renal pelvis on image 120. Right adnexal well-circumscribed cystic lesion measures 3.2 cm and is similar to on the prior exam. Pelvic floor laxity. Right hip arthroplasty. Osteopenia.  IMPRESSION: 1. Recurrent/metastatic disease within the chest. Bilateral hypermetabolic pulmonary nodules and enlarging hypermetabolic thoracic nodes. 2. Similar small left pleural effusion. 3. No evidence of extrathoracic metastatic disease. 4. Suspect small stones in the right renal pelvis. 5. Similar nonspecific but simple appearing cystic  lesion in the right adnexa. Of questionable clinical significance, given age and comorbidities. 6. Thyroid hypermetabolism which is grossly similar and may relate to thyroiditis.   Electronically Signed  By: Abigail Miyamoto M.D.  On: 06/28/2014 11:38   Impression/Plan:  I reviewed her PET scan with her and her daughters. Although this is consistent with metastatic disease in her chest, she doesn't have distant disease, and no drastic progression compared to her Oct Chest CT (although her pleural effusion appears to be growing slowly).  She is not symptomatic in any obvious way. We discussed several options, including:  1) Re consultation with Dr Julien Nordmann for potential systemic therapy  -- she is not interested in this  2)  Targeted radiation to gross  disease  --- not a recommended option by me, because she has no obvious symptoms and I do not think this would improve her QOL or prognosis at this time  3) Re scan with a CT in 3 mo --- she prefers this option, but agrees to doing so in 4 months instead.  She continues to be exhausted by her other medical issues and appointments and is focused on QOL.  She is still adamant against chemotherapy / systemic therapy of any kind.   I will schedule for her to see me in 4 mo with a CT of her chest.  She will continue to follow with pulmonary medicine.   Of course, she or her family may call me at any time.   I spent 25 minutes face to face with the patient and more than 50% of that time was spent in counseling and/or coordination of care. _____________________________________   Eppie Gibson, MD

## 2014-07-11 ENCOUNTER — Encounter: Payer: Self-pay | Admitting: Radiation Oncology

## 2014-07-11 NOTE — Progress Notes (Signed)
FMLA paperwork rec'd via fax for patient's son.  Forwarded to RN for processing.

## 2014-07-12 ENCOUNTER — Telehealth: Payer: Self-pay | Admitting: *Deleted

## 2014-07-12 NOTE — Telephone Encounter (Signed)
CALLED PATIENT TO INFORM OF LAB, TEST ON 11-10-14 AND HER FU VISIT WITH DR. Isidore Moos ON 11-11-14 @ 11 AM TO GET THE RESULTS FROM LAB AND CT, PATIENT VERBALIZED UNDERSTANDING ALL THESE APPTS.

## 2014-07-26 ENCOUNTER — Encounter: Payer: Self-pay | Admitting: Neurology

## 2014-07-26 ENCOUNTER — Ambulatory Visit (INDEPENDENT_AMBULATORY_CARE_PROVIDER_SITE_OTHER): Payer: Medicare Other | Admitting: Neurology

## 2014-07-26 VITALS — BP 175/70 | HR 53 | Ht 71.0 in | Wt 188.2 lb

## 2014-07-26 DIAGNOSIS — I10 Essential (primary) hypertension: Secondary | ICD-10-CM

## 2014-07-26 DIAGNOSIS — E785 Hyperlipidemia, unspecified: Secondary | ICD-10-CM

## 2014-07-26 DIAGNOSIS — R42 Dizziness and giddiness: Secondary | ICD-10-CM

## 2014-07-26 DIAGNOSIS — I639 Cerebral infarction, unspecified: Secondary | ICD-10-CM

## 2014-07-26 NOTE — Progress Notes (Signed)
STROKE NEUROLOGY FOLLOW UP NOTE  NAME: Jodi Frazier DOB: 07-Oct-1930  REASON FOR VISIT: stroke follow up HISTORY FROM: pt and chart  Today we had the pleasure of seeing Jodi Frazier in follow-up at our Neurology Clinic. Pt was accompanied by daughter.   History Summary [name] is an 81 y.o. female with a history of chronic vertigo, hypertension, hyperlipidemia, COPD, myocardial infarction and adenocarcinoma of the lung was admitted on 02/08/14 due to sudden vertigo exacerbated by movement. She was diagnosed with lung cancer in January this year underwent radiation. During that time she had a fall due to hypotension, was put on midodrine. While she was in rehabilitation, she developed dizziness and vertigo, was put on meclizine and trained for BPPV maneuvers. She had subsequently an ENT consult, and was able to wean off meclizine over time. At baseline she still has intermittent dizziness, but not as severe as before. On 02/08/2014, she had again episode of dizziness, more consistent with vertigo. She was sent to the ER, was giving meclizine, and her symptoms resolved within 2 hours. MRI showed 5 mm acute right cerebellar ischemic infarction. MRA was unremarkable except for atherosclerotic changes. Patient has no previous history of stroke nor TIA. She's been taking aspirin 81 mg per day, therefore she was put on plavix for stroke prevention. She was discharged in good conditions.  04/07/14 follow up - the patient has been doing well. No recurrent symptoms. She is on Plavix for stroke prevention. Her LDL was 134, so she is on zetia and Crestor. For Crestor, she is on 5 mg 4 times per week due to previous side effect from myalgia with statins.  Interval History During the interval time,  Pt was doing well during the interval time. She still has chronic vertigo as she has for a long time. She went to see ENT and was told to have "cervical vertigo" and underwent vestibular rehab and  she felt much better. She also follows with pulmonology for possible recurrence of lung cancer. She stated that her BP was high at home about 160s, but she is on norvasc 2.5, coreg 6.25 and midodrine 2.5mg . She denies any orthostatic hypotension. BP today is high in clinic 175/70.  She is also on crestor 5mg , but she takes 4 times a week due to previous intolerance with lipitor and crestor. She is to see her PCP in march. She prefer to recheck LDL next visit with her PCP.  REVIEW OF SYSTEMS: Full 14 system review of systems performed and notable only for those listed below and in HPI above, all others are negative:  Constitutional: N/A  Cardiovascular: N/A  Ear/Nose/Throat: N/A  Skin: N/A  Eyes: N/A  Respiratory: N/A  Gastroitestinal: N/A  Genitourinary: N/A Hematology/Lymphatic: N/A  Endocrine: N/A  Musculoskeletal: N/A  Allergy/Immunology: N/A  Neurological: N/A  Psychiatric: N/A  The following represents the patient's updated allergies and side effects list: Allergies  Allergen Reactions  . Codeine Hives  . Augmentin [Amoxicillin-Pot Clavulanate] Diarrhea and Nausea And Vomiting  . Penicillins Hives    Labs since last visit of relevance include the following: Results for orders placed or performed during the hospital encounter of 06/28/14  Glucose, capillary  Result Value Ref Range   Glucose-Capillary 95 70 - 99 mg/dL    The neurologically relevant items on the patient's problem list were reviewed on today's visit.  Neurologic Examination  A problem focused neurological exam (12 or more points of the single system neurologic examination, vital  signs counts as 1 point, cranial nerves count for 8 points) was performed.  Blood pressure 175/70, pulse 53, height 5\' 11"  (1.803 m), weight 188 lb 3.2 oz (85.367 kg).  General - Well nourished, well developed, in no apparent distress.  Ophthalmologic - not able to see through.  Cardiovascular - Regular rate and rhythm with no  murmur.  Mental Status -  Level of arousal and orientation to time, place, and person were intact. Language including expression, naming, repetition, comprehension was assessed and found intact. Fund of Knowledge was assessed and was intact.  Cranial Nerves II - XII - II - Visual field intact OU. III, IV, VI - Extraocular movements intact. V - Facial sensation intact bilaterally. VII - Facial movement intact bilaterally. VIII - Hearing & vestibular intact bilaterally. X - Palate elevates symmetrically. XI - Chin turning & shoulder shrug intact bilaterally. XII - Tongue protrusion intact.  Motor Strength - The patient's strength was normal in all extremities and pronator drift was absent.  Bulk was normal and fasciculations were absent.   Motor Tone - Muscle tone was assessed at the neck and appendages and was normal.  Reflexes - The patient's reflexes were 1+ in all extremities and she had no pathological reflexes.  Sensory - Light touch, temperature/pinprick were assessed and were normal.    Coordination - The patient had normal movements in the hands and feet with no ataxia or dysmetria.  Tremor was absent.  Gait and Station - walk with cane, slow with small strides, but steady.  Data reviewed: I personally reviewed the images and agree with the radiology interpretations.  MRI and MRA of the brain 1. 5 mm acute right cerebellar infarct.  2. No evidence of major intracranial arterial occlusion.  3. Intracranial atherosclerosis appears to have increased from the  prior MRA, although findings may be accentuated by motion artifact  on the current examination. There is moderate irregular narrowing of  the nondominant right vertebral artery as well as of the right  greater than left proximal PCAs.  Carotid Doppler 1-39% internal carotid artery stenosis bilaterally. Vertebral arteries are patent with antegrade flow.  2D Echocardiogram Left ventricle: The cavity size was normal.  There was mild concentric hypertrophy. Systolic function was vigorous. The estimated ejection fraction was in the range of 65% to 70%. Wall motion was normal; there were no regional wall motion abnormalities.  CXR Similar appearance of bilateral radiation changes superimposed upon  COPD/chronic bronchitis. No convincing evidence of acute  superimposed process.   Component     Latest Ref Rng 02/09/2014  Cholesterol     0 - 200 mg/dL 210 (H)  Triglycerides     <150 mg/dL 186 (H)  HDL     >39 mg/dL 39 (L)  Total CHOL/HDL Ratio      5.4  VLDL     0 - 40 mg/dL 37  LDL (calc)     0 - 99 mg/dL 134 (H)  Hemoglobin A1C     <5.7 % 5.8 (H)  Mean Plasma Glucose     <117 mg/dL 120 (H)  TSH     0.350 - 4.500 uIU/mL 1.630    Assessment: As you may recall, she is a 81 y.o. Caucasian female with PMH of chronic vertigo, hypertension, hyperlipidemia, COPD, myocardial infarction and adenocarcinoma of the lung was admitted on 02/08/14 due to sudden vertigo exacerbated by movement. MRI showed right cerebellar small stroke. MRA showed intracranial stenosis. LDL 134. She was discharged on plavix and  statin. She has chronic vertigo and followed with ENT and underwent vestibular rehab. Her BP still high.  Plan:  - continue plavix and crestor for stroke prevention  - continue zetia for HLD - check BP at home and record and bring over to PCP, and make medication adjustment including norvasc, coreg and midodrine.  - BP goal 120-140. - Follow up with your primary care physician for stroke risk factor modification. Recommend maintain blood pressure goal <130/80, diabetes with hemoglobin A1c goal below 6.5% and lipids with LDL cholesterol goal below 70 mg/dL.   - Continue vestibular rehab at home  - RTC PRN  No orders of the defined types were placed in this encounter.    No orders of the defined types were placed in this encounter.    Patient Instructions  - continue plavix and crestor for stroke  prevention - continue zetia for HLD - check BP at home twice a day and record and bring over to Dr. Hassell Done. BP goal 120-140 - discuss with Dr. Hassell Done at next visit regarding BP medication use, including amlodipine, coreg and midodrine. - Follow up with your primary care physician for stroke risk factor modification. Recommend maintain blood pressure goal <130/80, diabetes with hemoglobin A1c goal below 6.5% and lipids with LDL cholesterol goal below 70 mg/dL.  - continue vestibular rehab at home - if you experience vertigo or other symptoms out of your normal chronic vertigo, please call 911 or go [facility] for [facility]. - follow up as needed.    Rosalin Hawking, MD PhD [facility] [address], [place], [place] [address] [phone]

## 2014-07-26 NOTE — Patient Instructions (Addendum)
-   continue plavix and crestor for stroke prevention - continue zetia for HLD - check BP at home twice a day and record and bring over to Dr. Hassell Done. BP goal 120-140 - discuss with Dr. Hassell Done at next visit regarding BP medication use, including amlodipine, coreg and midodrine. - Follow up with your primary care physician for stroke risk factor modification. Recommend maintain blood pressure goal <130/80, diabetes with hemoglobin A1c goal below 6.5% and lipids with LDL cholesterol goal below 70 mg/dL.  - continue vestibular rehab at home - if you experience vertigo or other symptoms out of your normal chronic vertigo, please call 911 or go [facility] for [facility]. - follow up as needed.

## 2014-07-27 ENCOUNTER — Encounter: Payer: Self-pay | Admitting: Radiation Oncology

## 2014-07-27 NOTE — Progress Notes (Signed)
1.27.16:  Rec'd FMLA paperwork back from physician.  Faxed to [employer], Georgiann Mccoy, at [phone].  Made copy for scanning.  Mailed original back to patient to give to daughter.

## 2014-08-03 ENCOUNTER — Telehealth: Payer: Self-pay | Admitting: *Deleted

## 2014-08-03 MED ORDER — CLOPIDOGREL BISULFATE 75 MG PO TABS: 75.0000 mg | ORAL_TABLET | Freq: Every day | ORAL | Status: DC

## 2014-08-03 MED ORDER — EZETIMIBE 10 MG PO TABS: 10.0000 mg | ORAL_TABLET | Freq: Every day | ORAL | Status: DC

## 2014-08-03 NOTE — Telephone Encounter (Signed)
Rx request to pharmacy/SLS  

## 2014-08-16 ENCOUNTER — Other Ambulatory Visit: Payer: Self-pay | Admitting: Physician Assistant

## 2014-08-17 NOTE — Telephone Encounter (Signed)
Refill sent per [facility] refill protocol, per VO provider/SLS

## 2014-09-15 ENCOUNTER — Telehealth: Payer: Self-pay | Admitting: Physician Assistant

## 2014-09-16 NOTE — Telephone Encounter (Signed)
Patient states that she is out of this medication

## 2014-09-26 ENCOUNTER — Other Ambulatory Visit: Payer: Self-pay | Admitting: Physician Assistant

## 2014-09-27 NOTE — Telephone Encounter (Signed)
Was refilled 3/18 @ [facility].

## 2014-09-28 NOTE — Telephone Encounter (Signed)
Medication Detail      Disp Refills Start End     amitriptyline (ELAVIL) 25 MG tablet 90 tablet 0 06/15/2014     Sig: TAKE ONE TABLET BY MOUTH AT BEDTIME    Notes to Pharmacy: LAST RX 04/05/14, #90 X NO REFILLS. DO NOT FILL BEFORE 07/04/14.    E-Prescribing Status: Receipt confirmed by pharmacy (06/15/2014 3:34 PM EST)    Medication Detail      Disp Refills Start End     cephALEXin (KEFLEX) 500 MG capsule 90 capsule 0 07/04/2014     Sig: TAKE 1 CAPSULE (500 MG TOTAL) BY MOUTH DAILY. MAINTENANCE FOR UTI    E-Prescribing Status: Receipt confirmed by pharmacy (07/04/2014 10:39 AM EST)

## 2014-10-18 ENCOUNTER — Other Ambulatory Visit: Payer: Self-pay | Admitting: Physician Assistant

## 2014-10-18 NOTE — Telephone Encounter (Signed)
Rx request to pharmacy/SLS  

## 2014-11-01 ENCOUNTER — Ambulatory Visit (INDEPENDENT_AMBULATORY_CARE_PROVIDER_SITE_OTHER): Payer: Medicare Other | Admitting: Physician Assistant

## 2014-11-01 VITALS — BP 150/63 | HR 57 | Temp 98.0°F | Wt 188.0 lb

## 2014-11-01 DIAGNOSIS — E785 Hyperlipidemia, unspecified: Secondary | ICD-10-CM

## 2014-11-01 DIAGNOSIS — F0390 Unspecified dementia without behavioral disturbance: Secondary | ICD-10-CM | POA: Diagnosis not present

## 2014-11-01 DIAGNOSIS — Z79899 Other long term (current) drug therapy: Secondary | ICD-10-CM | POA: Diagnosis not present

## 2014-11-01 DIAGNOSIS — I639 Cerebral infarction, unspecified: Secondary | ICD-10-CM | POA: Diagnosis not present

## 2014-11-01 DIAGNOSIS — I10 Essential (primary) hypertension: Secondary | ICD-10-CM

## 2014-11-01 MED ORDER — ONDANSETRON HCL 8 MG PO TABS: 8.0000 mg | ORAL_TABLET | Freq: Three times a day (TID) | ORAL | Status: AC | PRN

## 2014-11-01 MED ORDER — EZETIMIBE 10 MG PO TABS: 10.0000 mg | ORAL_TABLET | Freq: Every day | ORAL | Status: DC

## 2014-11-01 MED ORDER — LEVOTHYROXINE SODIUM 125 MCG PO TABS: ORAL_TABLET | ORAL | Status: DC

## 2014-11-01 MED ORDER — ESCITALOPRAM OXALATE 10 MG PO TABS: 10.0000 mg | ORAL_TABLET | Freq: Every day | ORAL | Status: DC

## 2014-11-01 MED ORDER — FLUTICASONE FUROATE-VILANTEROL 100-25 MCG/INH IN AEPB: INHALATION_SPRAY | RESPIRATORY_TRACT | Status: AC

## 2014-11-01 MED ORDER — CLONIDINE HCL 0.1 MG PO TABS: 0.1000 mg | ORAL_TABLET | ORAL | Status: DC | PRN

## 2014-11-01 MED ORDER — ROSUVASTATIN CALCIUM 5 MG PO TABS
5.0000 mg | ORAL_TABLET | Freq: Every day | ORAL | Status: DC
Start: 2014-11-01 — End: 2015-05-12

## 2014-11-01 MED ORDER — ALPRAZOLAM 0.25 MG PO TABS: 0.2500 mg | ORAL_TABLET | Freq: Two times a day (BID) | ORAL | Status: DC | PRN

## 2014-11-01 MED ORDER — PANTOPRAZOLE SODIUM 40 MG PO TBEC: 40.0000 mg | DELAYED_RELEASE_TABLET | Freq: Every morning | ORAL | Status: DC

## 2014-11-01 MED ORDER — AMITRIPTYLINE HCL 25 MG PO TABS: 25.0000 mg | ORAL_TABLET | Freq: Every day | ORAL | Status: DC

## 2014-11-01 MED ORDER — DONEPEZIL HCL 10 MG PO TABS: 15.0000 mg | ORAL_TABLET | Freq: Every day | ORAL | Status: DC

## 2014-11-01 MED ORDER — CLOPIDOGREL BISULFATE 75 MG PO TABS: 75.0000 mg | ORAL_TABLET | Freq: Every day | ORAL | Status: DC

## 2014-11-01 MED ORDER — AMLODIPINE BESYLATE 2.5 MG PO TABS: 2.5000 mg | ORAL_TABLET | Freq: Every day | ORAL | Status: DC

## 2014-11-01 MED ORDER — CEPHALEXIN 500 MG PO CAPS: ORAL_CAPSULE | ORAL | Status: DC

## 2014-11-01 MED ORDER — NITROGLYCERIN 0.4 MG SL SUBL: 0.4000 mg | SUBLINGUAL_TABLET | SUBLINGUAL | Status: AC | PRN

## 2014-11-01 MED ORDER — MESALAMINE 1.2 G PO TBEC: 1.2000 g | DELAYED_RELEASE_TABLET | Freq: Three times a day (TID) | ORAL | Status: AC

## 2014-11-01 MED ORDER — CARVEDILOL 6.25 MG PO TABS: ORAL_TABLET | ORAL | Status: DC

## 2014-11-01 NOTE — Patient Instructions (Signed)
Please continue medications as directed. Make sure to use Breo daily as directed.  I will call you with your results.

## 2014-11-01 NOTE — Progress Notes (Signed)
Patient presents to clinic today for medication management. Patient needing refills of most of her medications. Daughter is present and states mother has decided to forgo any further treatment for her lung cancer. Patient is content with decision and wishes to enjoy "the rest of her time".  Past Medical History  Diagnosis Date  . Hypertension   . Colitis   . Coronary artery disease   . High cholesterol     takes Red Yeast Rice and Fish OIl daily  . Kidney stones   . PONV (postoperative nausea and vomiting)   . MI (myocardial infarction) 2010  . Shortness of breath     with exertion  . Pneumonia     hx of 20+yrs ago  . COPD (chronic obstructive pulmonary disease)     pleurisy or COPD exascerbation > 21yrago  . Headache(784.0)   . Vertigo     HTN related bc gets up too fast  . Arthritis   . Bruises easily   . History of shingles 14yrago    in eye;occasionally gets some residual   . Accessory skin tags     arms/legs  . GERD (gastroesophageal reflux disease)   . Colitis   . Diverticulosis   . Urinary frequency   . Stress incontinence   . History of kidney stones   . Hypothyroidism     takes sYnthroid daily  . Cataract     early stage on right  . Macular degeneration, wet   . Depression     some but takes Amitryptylline nightly  . Right-sided chest wall pain 08/28/12    PRESENTATION - RIGHT SIDED ANTERIOR CHEST PAIN  . Weight loss 08/28/12    10 LB WEIGHT LOSS OVER 3 MONTHS  . Lung mass 08/12/12    CHEST-XRAY/ PET -LOBULAR MASS LLL - 4.1 X 3.6 X 4.2 CM  . Dementia     mild  . S/P radiation therapy  10/05/2012-11/20/2012    Left Lower Lung and hilum / 70 Gy in 35 fractions  . Hypertension   . At risk for fall due to comorbid condition     5 falls - last Early December  . Vertigo   . Orthostatic hypotension   . S/P radiation therapy 2/13, 2/20, 2/23 (2015)    SBRT to Right Lung  . Adenocarcinoma of lung 09/07/12    needle core bx-LLL-adenocarcinoma    Current  Outpatient Prescriptions on File Prior to Visit  Medication Sig Dispense Refill  . albuterol (PROVENTIL HFA;VENTOLIN HFA) 108 (90 BASE) MCG/ACT inhaler Inhale 2 puffs into the lungs every 6 (six) hours as needed for wheezing. 1 Inhaler 3  . BIOTIN PO Take 1 tablet by mouth daily.     . cyanocobalamin (,VITAMIN B-12,) 1000 MCG/ML injection Inject 1,000 mcg into the muscle every 30 (thirty) days. Last dose was on 01-29-14    . cycloSPORINE (RESTASIS) 0.05 % ophthalmic emulsion Place 1 drop into both eyes 2 (two) times daily. 0.4 mL 0  . naproxen sodium (ANAPROX) 220 MG tablet Take 220 mg by mouth daily as needed.     No current facility-administered medications on file prior to visit.    Allergies  Allergen Reactions  . Codeine Hives  . Augmentin [Amoxicillin-Pot Clavulanate] Diarrhea and Nausea And Vomiting  . Penicillins Hives    Family History  Problem Relation Age of Onset  . Anesthesia problems Mother   . Hypotension Neg Hx   . Malignant hyperthermia Neg Hx   .  Pseudochol deficiency Neg Hx   . Cancer Maternal Aunt     breast    History   Social History  . Marital Status: Widowed    Spouse Name: N/A  . Number of Children: 4  . Years of Education: college   Occupational History  . retired     Press photographer   Social History Main Topics  . Smoking status: Former Smoker -- 1.00 packs/day for 40 years    Types: Cigarettes    Quit date: 08/26/2002  . Smokeless tobacco: Never Used  . Alcohol Use: Yes     Comment: OCC  . Drug Use: No  . Sexual Activity: No   Other Topics Concern  . Not on file   Social History Narrative   Daughter is nurse in neuro ICU   Patient lives at home alone   Patient left handed    Patient drinks coffee daily   Review of Systems - See HPI.  All other ROS are negative.  BP 150/63 mmHg  Pulse 57  Temp(Src) 98 F (36.7 C)  Wt 188 lb (85.276 kg)  SpO2 97%  Physical Exam  Constitutional: She is oriented to person, place, and time and  well-developed, well-nourished, and in no distress.  HENT:  Head: Normocephalic and atraumatic.  Eyes: Conjunctivae are normal.  Cardiovascular: Normal rate, regular rhythm, normal heart sounds and intact distal pulses.   Pulmonary/Chest: Effort normal and breath sounds normal. No respiratory distress. She has no wheezes. She has no rales. She exhibits no tenderness.  Neurological: She is alert and oriented to person, place, and time.  Skin: Skin is warm and dry. No rash noted.  Psychiatric: Affect normal.  Vitals reviewed.   No results found for this or any previous visit (from the past 2160 hour(s)).  Assessment/Plan: Medication management Chronic maintenance medications reviewed with patient and daughter.  Refills sent to pharmacy.   HTN (hypertension) Stable.  Will continue the same regimen.  Clonidine only when SBP > 160 or DBP > 100. Medications refilled.  Will check BMP today.   HLD (hyperlipidemia) Will obtain repeat lipid panel today.  Will switch branded Crestor to generic rosuvastatin.   Dementia Remains mild. Patient very alert and oriented. No concerns today.  Will continue Aricept giving mild impairment and family history.

## 2014-11-01 NOTE — Progress Notes (Signed)
Pre visit review using our clinic review tool, if applicable. No additional management support is needed unless otherwise documented below in the visit note. 

## 2014-11-02 ENCOUNTER — Encounter: Payer: Self-pay | Admitting: Physician Assistant

## 2014-11-02 LAB — CBC WITH DIFFERENTIAL/PLATELET
BASOS ABS: 0.1 10*3/uL (ref 0.0–0.1)
Basophils Relative: 1.5 % (ref 0.0–3.0)
Eosinophils Absolute: 0.3 10*3/uL (ref 0.0–0.7)
Eosinophils Relative: 3.4 % (ref 0.0–5.0)
HCT: 39.4 % (ref 36.0–46.0)
HEMOGLOBIN: 13.1 g/dL (ref 12.0–15.0)
LYMPHS ABS: 1.5 10*3/uL (ref 0.7–4.0)
Lymphocytes Relative: 21 % (ref 12.0–46.0)
MCHC: 33.3 g/dL (ref 30.0–36.0)
MCV: 80.4 fl (ref 78.0–100.0)
MONOS PCT: 10 % (ref 3.0–12.0)
Monocytes Absolute: 0.7 10*3/uL (ref 0.1–1.0)
NEUTROS PCT: 64.1 % (ref 43.0–77.0)
Neutro Abs: 4.7 10*3/uL (ref 1.4–7.7)
Platelets: 340 10*3/uL (ref 150.0–400.0)
RBC: 4.9 Mil/uL (ref 3.87–5.11)
RDW: 15.7 % — AB (ref 11.5–15.5)
WBC: 7.3 10*3/uL (ref 4.0–10.5)

## 2014-11-02 LAB — COMPREHENSIVE METABOLIC PANEL
ALK PHOS: 102 U/L (ref 39–117)
ALT: 13 U/L (ref 0–35)
AST: 16 U/L (ref 0–37)
Albumin: 3.7 g/dL (ref 3.5–5.2)
BILIRUBIN TOTAL: 0.3 mg/dL (ref 0.2–1.2)
BUN: 19 mg/dL (ref 6–23)
CO2: 25 meq/L (ref 19–32)
Calcium: 10.3 mg/dL (ref 8.4–10.5)
Chloride: 106 mEq/L (ref 96–112)
Creatinine, Ser: 1.12 mg/dL (ref 0.40–1.20)
GFR: 49.51 mL/min — ABNORMAL LOW (ref >60.00)
Glucose, Bld: 86 mg/dL (ref 70–99)
POTASSIUM: 4.4 meq/L (ref 3.5–5.1)
SODIUM: 137 meq/L (ref 135–145)
TOTAL PROTEIN: 7.1 g/dL (ref 6.0–8.3)

## 2014-11-02 LAB — LIPID PANEL
CHOLESTEROL: 166 mg/dL (ref 0–200)
HDL: 36.3 mg/dL — AB (ref >39.00)
NonHDL: 129.7
TRIGLYCERIDES: 320 mg/dL — AB (ref 0.0–149.0)
Total CHOL/HDL Ratio: 5
VLDL: 64 mg/dL — ABNORMAL HIGH (ref 0.0–40.0)

## 2014-11-02 LAB — LDL CHOLESTEROL, DIRECT: Direct LDL: 99 mg/dL

## 2014-11-02 LAB — TSH: TSH: 2.41 u[IU]/mL (ref 0.35–4.50)

## 2014-11-02 NOTE — Assessment & Plan Note (Signed)
Remains mild. Patient very alert and oriented. No concerns today.  Will continue Aricept giving mild impairment and family history.

## 2014-11-02 NOTE — Assessment & Plan Note (Signed)
Will obtain repeat lipid panel today.  Will switch branded Crestor to generic rosuvastatin.

## 2014-11-02 NOTE — Assessment & Plan Note (Signed)
Chronic maintenance medications reviewed with patient and daughter.  Refills sent to pharmacy.

## 2014-11-02 NOTE — Assessment & Plan Note (Signed)
Stable.  Will continue the same regimen.  Clonidine only when SBP > 160 or DBP > 100. Medications refilled.  Will check BMP today.

## 2014-11-10 ENCOUNTER — Ambulatory Visit (HOSPITAL_COMMUNITY): Payer: Medicare Other

## 2014-11-10 ENCOUNTER — Ambulatory Visit: Payer: Medicare Other

## 2014-11-11 ENCOUNTER — Ambulatory Visit: Admission: RE | Admit: 2014-11-11 | Payer: Medicare Other | Source: Ambulatory Visit | Admitting: Radiation Oncology

## 2014-11-22 ENCOUNTER — Other Ambulatory Visit: Payer: Self-pay | Admitting: Physician Assistant

## 2014-11-29 ENCOUNTER — Ambulatory Visit (INDEPENDENT_AMBULATORY_CARE_PROVIDER_SITE_OTHER): Payer: Medicare Other | Admitting: Emergency Medicine

## 2014-11-29 ENCOUNTER — Encounter: Payer: Self-pay | Admitting: Emergency Medicine

## 2014-11-29 VITALS — BP 118/74 | HR 52 | Ht 72.0 in | Wt 191.0 lb

## 2014-11-29 DIAGNOSIS — J449 Chronic obstructive pulmonary disease, unspecified: Secondary | ICD-10-CM

## 2014-11-29 DIAGNOSIS — I639 Cerebral infarction, unspecified: Secondary | ICD-10-CM

## 2014-11-29 DIAGNOSIS — C3490 Malignant neoplasm of unspecified part of unspecified bronchus or lung: Secondary | ICD-10-CM | POA: Diagnosis not present

## 2014-11-29 NOTE — Patient Instructions (Signed)
Please continue your Breo every day.  We had a good discussion today about your lung cancer, the potential timing for progression, the potential symptoms that could evolve. There will be potential interventions that we can make that well allow good symptom control. Please speak with either Dr. Lamonte Sakai for Dr. Isidore Moos if you have a change in symptoms Follow with Dr Lamonte Sakai in 4 months or sooner if you have any problems.

## 2014-11-29 NOTE — Progress Notes (Signed)
Subjective:    Patient ID: Jodi Frazier, female    DOB: 11/20/1930, 81 y.o.   MRN: [id number]  HPI 81 yo woman, former smoker (40 pk-yrs), HTN, CAD, adenoCA LLL s/p XRT 3-5/'14, COPD, rhinitis, GERD. She is on Advair + albuterol prn. She has had some increased cough and dyspnea since the end of XRT. Her CT scan chest done 02/22/13 shows shrinkage of LLL mass but some associated GGI in the L lung. She was just started on a pred taper by Dr Lanell Persons for this.   ROV 04/08/13 -- hx adenoCA LLL, COPD. She has undergone XRT to LLL lesion, was seen last time with some associated GGI suspicious for radiation pneumonitis. We treated with prednisone, she never tapered to '40mg'$ , has been on '60mg'$  qd. She is coughing more since last time. Taking advair qd, albuterol prn (2x a week).   ROV 05/04/13 -- hx adenoCA LLL, COPD. She has undergone XRT to LLL lesion, has been rx with pred for suspected radiation pneumonitis / GGI.  At last visit we tapered prednisone to off. We also stopped Advair and start of Breo. We also started protonix and added an allergy regimen for her cough. She has been admitted in the interim for PTX, received more prednisone and is currently tapering. CXR today shows some clearing. She believes that [name] is helping her. She still has nasal gtt.   ROV 06/10/13 --  COPD, hx adenoCA LLL s/p XRT and treated for radiation pneumonitis in 10-05/2013. Hx PTX.Marland Kitchen Also with cough > protonix. ? Loratadine and chlorpheniramine > stopped these. She c/o nausea today, ? Related to increasing the protonic to qd, ? Breo. Her cough appears to better controlled.   Acute OV 11/16/13 -- hx adenoCA LLL s/p XRT and treated for radiation pneumonitis in 10-05/2013. Hx PTX. Also with COPD and allergic rhinitis.  She c/o of dizziness and more dyspnea for the last 7-10 days. She may have some more LE swelling. She just finished 3 XRT to the RUL in March. Has not had and does not want chemo.    ROV 12/14/13 -- hx adenoCA LLL  s/p XRT and treated for radiation pneumonitis in 10-05/2013, followed with Dr Melvyn Novas 5/26 for CP after thora, lung didn't come up completely. The thora didn't change her breathing much at all.   ROV 05/04/14 -- hx adenoCA s/p XRT c/b radiation pneumonitis. Underwent L thora 5/'15 without any changes in her breathing. She had a recent small CVA. She underwent CT chest 10/23 > new areas of nodularity, new LUL soft tissue mass, smaller LLL mass, R hilar adenopathy.  She has spoken with Dr Isidore Moos and is planning for a PET scan - she has deferred this to after the holidays. She is on Breo, seems to be tolerating. She has not needed her SABA except twice ever.               ROV 11/29/14 -- follow-up visit for history of COPD. She also follows with Dr. Isidore Moos for adenocarcinoma of the lung status post radiation therapy and complicated some radiation pneumonitis. She underwent PET scan in 06/28/14 that showed recurrent metastatic disease and enlarging hypermetabolic thoracic nodes.   She has decided not to pursue any more chemotherapy or radiation therapy.  She is feeling well - no current clinical manifestations of her cancer.  She is on Breo, believes that it is helpful.  Review of Systems  Constitutional: Negative for fever and unexpected weight change.  HENT: Positive for rhinorrhea. Negative for congestion, dental problem, ear pain, nosebleeds, postnasal drip, sinus pressure, sneezing, sore throat and trouble swallowing.   Eyes: Negative for redness and itching.  Respiratory: Positive for cough and shortness of breath. Negative for chest tightness and wheezing.   Cardiovascular: Negative for palpitations and leg swelling.  Gastrointestinal: Negative for nausea and vomiting.  Genitourinary: Negative for dysuria.  Musculoskeletal: Negative for joint swelling.  Skin: Negative for rash.  Neurological:  Positive for light-headedness. Negative for headaches.  Hematological: Does not bruise/bleed easily.  Psychiatric/Behavioral: Negative for dysphoric mood. The patient is not nervous/anxious.      Filed Vitals:   11/29/14 1341 11/29/14 1344  BP:  118/74  Pulse:  52  Height: 6' (1.829 m)   Weight: 191 lb (86.637 kg)   SpO2:  96%    Gen: Pleasant, well-nourished, in no distress,  normal affect  ENT: No lesions,  mouth clear,  oropharynx clear, no postnasal drip  Neck: No JVD, no TMG, no carotid bruits  Lungs: No use of accessory muscles, no dullness to percussion, clear without rales or rhonchi  Cardiovascular: RRR, heart sounds normal, no murmur or gallops, no peripheral edema  Musculoskeletal: No deformities, no cyanosis or clubbing  Neuro: alert, non focal  Skin: Warm, no lesions or rashes     Objective:   Physical Exam Filed Vitals:   11/29/14 1341 11/29/14 1344  BP:  118/74  Pulse:  52  Height: 6' (1.829 m)   Weight: 191 lb (86.637 kg)   SpO2:  96%   04/22/14 --  COMPARISON: 01/06/2014  FINDINGS: Scattered atherosclerotic calcifications aorta and coronary arteries.  Upper normal caliber ascending thoracic aorta 3.6 x 3.5 cm image 29.  Small BILATERAL renal cysts.  Remaining visualized portions of upper abdomen unremarkable.  Thoracic adenopathy including 12 mm short axis precarinal node image 23, RIGHT suprahilar node 13 mm short axis image 23, and RIGHT hilar node 12 mm short axis image 25.  Additional small normal sized RIGHT paratracheal and pretracheal lymph nodes noted.  New area of opacity in the medial LEFT upper lobe anteriorly, 2.7 x 1.3 x 1.6 cm image 17, consistent with an mass of either LEFT upper lobe or pleural origin.  In addition, numerous small anterior LEFT upper lobe nodules concerning for tumor as well.  Paramediastinal changes of radiation therapy again identified.  LEFT infrahilar mass in medial LEFT lower lobe  1.7 x 1.3 cm previously 1.7 x 1.6 cm.  Scattered chronic changes in RIGHT lung with multiple new small RIGHT lower lobe nodules.  No acute infiltrate.  Persistent small LEFT pleural effusion and minimal adjacent atelectasis.  Bones appear demineralized without definite osseous metastasis.  IMPRESSION: Post radiation therapy changes in medial LEFT lung.  Multiple new tiny pulmonary nodules involving RIGHT lower lobe and LEFT upper lobe, cannot exclude tumor.  New anteromedial LEFT upper lobe soft tissue mass which could be pulmonary or pleural in origin, 2.7 x 1.3 x 1.6 cm.  Slight decrease in size of LEFT lower lobe mass since prior exam.  Slight increase in size of precarinal and RIGHT hilar adenopathy.      Assessment & Plan:  COPD (chronic obstructive pulmonary disease) Continue breo qd   Adenocarcinoma of lung Note progression on her PET scan from December. She is currently symptom-free. She has discussed possible chemotherapy with Dr. Isidore Moos but does not want to be referred for this.  There is no clear role for radiation at this time although that might change. Had a good discussion with her today regarding palliative care including palliation of evolving symptoms even with radiation or for example with the Pleurx catheter if she were to develop pleural effusion. At some point hospice care would likely be appropriate. She was open to this discussion and will let us know if symptoms evolve. In particular I discussed CODE STATUS with her. She has talked to her family and made a clear that she would want to be DO NOT RESUSCITATE in event of an acute decompensation. She has all the appropriate paperwork.   Over 75% of this 25 minute visit was spent counseling on the above

## 2014-11-29 NOTE — Assessment & Plan Note (Signed)
Continue breo qd

## 2014-11-29 NOTE — Assessment & Plan Note (Signed)
Note progression on her PET scan from December. She is currently symptom-free. She has discussed possible chemotherapy with Dr. Isidore Moos but does not want to be referred for this. There is no clear role for radiation at this time although that might change. Had a good discussion with her today regarding palliative care including palliation of evolving symptoms even with radiation or for example with the Pleurx catheter if she were to develop pleural effusion. At some point hospice care would likely be appropriate. She was open to this discussion and will let us know if symptoms evolve. In particular I discussed CODE STATUS with her. She has talked to her family and made a clear that she would want to be DO NOT RESUSCITATE in event of an acute decompensation. She has all the appropriate paperwork.   Over 75% of this 25 minute visit was spent counseling on the above

## 2014-12-04 ENCOUNTER — Emergency Department (HOSPITAL_COMMUNITY): Payer: Medicare Other

## 2014-12-04 ENCOUNTER — Encounter (HOSPITAL_COMMUNITY): Payer: Self-pay | Admitting: *Deleted

## 2014-12-04 ENCOUNTER — Emergency Department (HOSPITAL_COMMUNITY)
Admission: EM | Admit: 2014-12-04 | Discharge: 2014-12-04 | Disposition: A | Discharge status: Home / Self Care | Payer: Medicare Other | Attending: Emergency Medicine | Admitting: Emergency Medicine

## 2014-12-04 DIAGNOSIS — Z90710 Acquired absence of both cervix and uterus: Secondary | ICD-10-CM | POA: Insufficient documentation

## 2014-12-04 DIAGNOSIS — Z923 Personal history of irradiation: Secondary | ICD-10-CM | POA: Diagnosis not present

## 2014-12-04 DIAGNOSIS — I10 Essential (primary) hypertension: Secondary | ICD-10-CM | POA: Insufficient documentation

## 2014-12-04 DIAGNOSIS — Z87891 Personal history of nicotine dependence: Secondary | ICD-10-CM | POA: Diagnosis not present

## 2014-12-04 DIAGNOSIS — Z880 Allergy status to penicillin: Secondary | ICD-10-CM | POA: Diagnosis not present

## 2014-12-04 DIAGNOSIS — Z8619 Personal history of other infectious and parasitic diseases: Secondary | ICD-10-CM | POA: Insufficient documentation

## 2014-12-04 DIAGNOSIS — Z79899 Other long term (current) drug therapy: Secondary | ICD-10-CM | POA: Insufficient documentation

## 2014-12-04 DIAGNOSIS — J441 Chronic obstructive pulmonary disease with (acute) exacerbation: Secondary | ICD-10-CM | POA: Insufficient documentation

## 2014-12-04 DIAGNOSIS — Z8701 Personal history of pneumonia (recurrent): Secondary | ICD-10-CM | POA: Insufficient documentation

## 2014-12-04 DIAGNOSIS — E039 Hypothyroidism, unspecified: Secondary | ICD-10-CM | POA: Insufficient documentation

## 2014-12-04 DIAGNOSIS — F329 Major depressive disorder, single episode, unspecified: Secondary | ICD-10-CM | POA: Insufficient documentation

## 2014-12-04 DIAGNOSIS — K219 Gastro-esophageal reflux disease without esophagitis: Secondary | ICD-10-CM | POA: Insufficient documentation

## 2014-12-04 DIAGNOSIS — E782 Mixed hyperlipidemia: Secondary | ICD-10-CM | POA: Diagnosis not present

## 2014-12-04 DIAGNOSIS — I2510 Atherosclerotic heart disease of native coronary artery without angina pectoris: Secondary | ICD-10-CM | POA: Insufficient documentation

## 2014-12-04 DIAGNOSIS — R1012 Left upper quadrant pain: Secondary | ICD-10-CM | POA: Insufficient documentation

## 2014-12-04 DIAGNOSIS — Z7902 Long term (current) use of antithrombotics/antiplatelets: Secondary | ICD-10-CM | POA: Insufficient documentation

## 2014-12-04 DIAGNOSIS — Z9049 Acquired absence of other specified parts of digestive tract: Secondary | ICD-10-CM | POA: Diagnosis not present

## 2014-12-04 DIAGNOSIS — R0602 Shortness of breath: Secondary | ICD-10-CM | POA: Diagnosis present

## 2014-12-04 DIAGNOSIS — Z9181 History of falling: Secondary | ICD-10-CM | POA: Diagnosis not present

## 2014-12-04 DIAGNOSIS — Z85118 Personal history of other malignant neoplasm of bronchus and lung: Secondary | ICD-10-CM | POA: Insufficient documentation

## 2014-12-04 DIAGNOSIS — Z8742 Personal history of other diseases of the female genital tract: Secondary | ICD-10-CM | POA: Diagnosis not present

## 2014-12-04 DIAGNOSIS — R0781 Pleurodynia: Secondary | ICD-10-CM | POA: Diagnosis not present

## 2014-12-04 DIAGNOSIS — Z9861 Coronary angioplasty status: Secondary | ICD-10-CM | POA: Insufficient documentation

## 2014-12-04 DIAGNOSIS — M1990 Unspecified osteoarthritis, unspecified site: Secondary | ICD-10-CM | POA: Insufficient documentation

## 2014-12-04 DIAGNOSIS — I252 Old myocardial infarction: Secondary | ICD-10-CM | POA: Diagnosis not present

## 2014-12-04 DIAGNOSIS — Z87442 Personal history of urinary calculi: Secondary | ICD-10-CM | POA: Diagnosis not present

## 2014-12-04 DIAGNOSIS — Z791 Long term (current) use of non-steroidal anti-inflammatories (NSAID): Secondary | ICD-10-CM | POA: Insufficient documentation

## 2014-12-04 DIAGNOSIS — F0390 Unspecified dementia without behavioral disturbance: Secondary | ICD-10-CM | POA: Diagnosis not present

## 2014-12-04 DIAGNOSIS — Z7951 Long term (current) use of inhaled steroids: Secondary | ICD-10-CM | POA: Diagnosis not present

## 2014-12-04 DIAGNOSIS — Z792 Long term (current) use of antibiotics: Secondary | ICD-10-CM | POA: Diagnosis not present

## 2014-12-04 DIAGNOSIS — R109 Unspecified abdominal pain: Secondary | ICD-10-CM

## 2014-12-04 LAB — COMPREHENSIVE METABOLIC PANEL
ALK PHOS: 104 U/L (ref 38–126)
ALT: 14 U/L (ref 14–54)
AST: 16 U/L (ref 15–41)
Albumin: 3.3 g/dL — ABNORMAL LOW (ref 3.5–5.0)
Anion gap: 7 (ref 5–15)
BUN: 15 mg/dL (ref 6–20)
CALCIUM: 9.8 mg/dL (ref 8.9–10.3)
CO2: 24 mmol/L (ref 22–32)
Chloride: 110 mmol/L (ref 101–111)
Creatinine, Ser: 0.87 mg/dL (ref 0.44–1.00)
GFR calc Af Amer: >60 mL/min (ref >60)
GLUCOSE: 103 mg/dL — AB (ref 65–99)
Potassium: 4.1 mmol/L (ref 3.5–5.1)
Sodium: 141 mmol/L (ref 135–145)
Total Bilirubin: 0.1 mg/dL — ABNORMAL LOW (ref 0.3–1.2)
Total Protein: 6.5 g/dL (ref 6.5–8.1)

## 2014-12-04 LAB — CBC WITH DIFFERENTIAL/PLATELET
BASOS ABS: 0.1 10*3/uL (ref 0.0–0.1)
Basophils Relative: 1 % (ref 0–1)
EOS PCT: 4 % (ref 0–5)
Eosinophils Absolute: 0.3 10*3/uL (ref 0.0–0.7)
HCT: 37.5 % (ref 36.0–46.0)
HEMOGLOBIN: 12.1 g/dL (ref 12.0–15.0)
Lymphocytes Relative: 24 % (ref 12–46)
Lymphs Abs: 1.6 10*3/uL (ref 0.7–4.0)
MCH: 27.1 pg (ref 26.0–34.0)
MCHC: 32.3 g/dL (ref 30.0–36.0)
MCV: 84.1 fL (ref 78.0–100.0)
Monocytes Absolute: 0.8 10*3/uL (ref 0.1–1.0)
Monocytes Relative: 12 % (ref 3–12)
NEUTROS PCT: 59 % (ref 43–77)
Neutro Abs: 4 10*3/uL (ref 1.7–7.7)
PLATELETS: 281 10*3/uL (ref 150–400)
RBC: 4.46 MIL/uL (ref 3.87–5.11)
RDW: 15.2 % (ref 11.5–15.5)
WBC: 6.7 10*3/uL (ref 4.0–10.5)

## 2014-12-04 LAB — URINALYSIS, ROUTINE W REFLEX MICROSCOPIC
Bilirubin Urine: NEGATIVE
Glucose, UA: NEGATIVE mg/dL
Ketones, ur: NEGATIVE mg/dL
Leukocytes, UA: NEGATIVE
Nitrite: NEGATIVE
PH: 6 (ref 5.0–8.0)
Protein, ur: NEGATIVE mg/dL
Specific Gravity, Urine: 1.011 (ref 1.005–1.030)
Urobilinogen, UA: 0.2 mg/dL (ref 0.0–1.0)

## 2014-12-04 LAB — URINE MICROSCOPIC-ADD ON

## 2014-12-04 LAB — TROPONIN I: Troponin I: <0.03 ng/mL (ref <0.031)

## 2014-12-04 LAB — LIPASE, BLOOD: LIPASE: 29 U/L (ref 22–51)

## 2014-12-04 LAB — BRAIN NATRIURETIC PEPTIDE: B Natriuretic Peptide: 60.4 pg/mL (ref 0.0–100.0)

## 2014-12-04 MED ORDER — IOHEXOL 350 MG/ML SOLN
100.0000 mL | Freq: Once | INTRAVENOUS | Status: AC | PRN
  Administered 2014-12-04: 100 mL via INTRAVENOUS

## 2014-12-04 NOTE — ED Notes (Signed)
Increased pain when taking a deep breath, pt has lung cancer, in past has had fluid in lungs, reports it feels the same

## 2014-12-04 NOTE — ED Notes (Signed)
Bed: PV66 Expected date:  Expected time:  Means of arrival:  Comments: Hold Ca pt

## 2014-12-04 NOTE — ED Notes (Signed)
Pt 02 96%-97% while ambulating

## 2014-12-04 NOTE — ED Notes (Signed)
Patient transported to CT 

## 2014-12-04 NOTE — ED Provider Notes (Signed)
CSN: [id number]     Arrival date & time 12/04/14  1737 History   First MD Initiated Contact with Patient 12/04/14 1753     Chief Complaint  Patient presents with  . Shortness of Breath     (Consider location/radiation/quality/duration/timing/severity/associated sxs/prior Treatment) HPI Comments: Patient presents with 3 day history of left sided rib and flank pain and left-sided abdominal pain. His been intermittent lasting several hours at a time. Denies any nausea, vomiting, urinary tenderness. Patient has a history of lung cancer status post chemotherapy and radiation. She saw her pulmonologist last week and everything was doing well. She denies any more shortness of breath worse than baseline. She denies any chest pain. She denies any cough or fever. The pain reminds her when she had fluid on her lungs last year. The pain is in her abdomen and not in her chest. She denies any back pain, dysuria or hematuria, vaginal bleeding or discharge. No leg swelling. She's been taking ibuprofen at home with some relief.  The history is provided by the patient and a relative.    Past Medical History  Diagnosis Date  . Hypertension   . Colitis   . Coronary artery disease   . High cholesterol     takes Red Yeast Rice and Fish OIl daily  . Kidney stones   . PONV (postoperative nausea and vomiting)   . MI (myocardial infarction) 2010  . Shortness of breath     with exertion  . Pneumonia     hx of 20+yrs ago  . COPD (chronic obstructive pulmonary disease)     pleurisy or COPD exascerbation > 25yrago  . Headache(784.0)   . Vertigo     HTN related bc gets up too fast  . Arthritis   . Bruises easily   . History of shingles 190yrago    in eye;occasionally gets some residual   . Accessory skin tags     arms/legs  . GERD (gastroesophageal reflux disease)   . Colitis   . Diverticulosis   . Urinary frequency   . Stress incontinence   . History of kidney stones   . Hypothyroidism     takes  sYnthroid daily  . Cataract     early stage on right  . Macular degeneration, wet   . Depression     some but takes Amitryptylline nightly  . Right-sided chest wall pain 08/28/12    PRESENTATION - RIGHT SIDED ANTERIOR CHEST PAIN  . Weight loss 08/28/12    10 LB WEIGHT LOSS OVER 3 MONTHS  . Lung mass 08/12/12    CHEST-XRAY/ PET -LOBULAR MASS LLL - 4.1 X 3.6 X 4.2 CM  . Dementia     mild  . S/P radiation therapy  10/05/2012-11/20/2012    Left Lower Lung and hilum / 70 Gy in 35 fractions  . Hypertension   . At risk for fall due to comorbid condition     5 falls - last Early December  . Vertigo   . Orthostatic hypotension   . S/P radiation therapy 2/13, 2/20, 2/23 (2015)    SBRT to Right Lung  . Adenocarcinoma of lung 09/07/12    needle core bx-LLL-adenocarcinoma   Past Surgical History  Procedure Laterality Date  . Hip arthroscopy  [id number]    right hip-replacement  . Knee arthroscopy      LT  . Total knee arthroplasty  [id number]    left  . Appendectomy  [id number]  .  Abdominal hysterectomy  64yr ago   . Lithotripsy  2011  . Cataract surgery  2012    left  . Eye surgery  89yrago  . Bladder surgery  2001    tacked  . Coronary angioplasty with stent placement  2010    2 in rt coronary artery and 1 in another spot  . Colonoscopy    . Esophagogastroduodenoscopy    . Total knee arthroplasty  09/09/2011    Procedure: TOTAL KNEE ARTHROPLASTY;  Surgeon: StRudean HaskellMD;  [facility] Service: Orthopedics;  Laterality: Right;  Right Total Knee Arthroplasty  . Bronchial brush biopsy Left 08/17/12    LLL Bronchial Washing / Brushing and Bronchial Biopsy: Negative for malignancy  . Left lower lobe needle core biopsy Left 09/07/12    Poorly Differentiated Adenocarcinoma   . Joint replacement     Family History  Problem Relation Age of Onset  . Anesthesia problems Mother   . Hypotension Neg Hx   . Malignant hyperthermia Neg Hx   . Pseudochol deficiency Neg Hx   . Cancer  Maternal Aunt     breast   History  Substance Use Topics  . Smoking status: Former Smoker -- 1.00 packs/day for 40 years    Types: Cigarettes    Quit date: 08/26/2002  . Smokeless tobacco: Never Used  . Alcohol Use: Yes     Comment: OCC   OB History    No data available     Review of Systems  Constitutional: Negative for fever, activity change and appetite change.  HENT: Negative for congestion and rhinorrhea.   Respiratory: Positive for shortness of breath. Negative for cough and chest tightness.   Cardiovascular: Negative for leg swelling.  Gastrointestinal: Positive for abdominal pain. Negative for nausea and vomiting.  Genitourinary: Positive for flank pain. Negative for dysuria, hematuria, vaginal bleeding and vaginal discharge.  Musculoskeletal: Negative for myalgias and arthralgias.  Skin: Negative for wound.  Neurological: Negative for dizziness, weakness and headaches.  A complete 10 system review of systems was obtained and all systems are negative except as noted in the HPI and PMH.      Allergies  Codeine; Augmentin; and Penicillins  Home Medications   Prior to Admission medications   Medication Sig Start Date End Date Taking? Authorizing Provider  albuterol (PROVENTIL HFA;VENTOLIN HFA) 108 (90 BASE) MCG/ACT inhaler Inhale 2 puffs into the lungs every 6 (six) hours as needed for wheezing. 02/08/13  Yes KoDoree FudgeMD  ALPRAZolam (XANAX) 0.25 MG tablet Take 1 tablet (0.25 mg total) by mouth 2 (two) times daily as needed for anxiety. 11/01/14  Yes WiBrunetta JeansPA-C  amitriptyline (ELAVIL) 25 MG tablet Take 1 tablet (25 mg total) by mouth at bedtime. 11/01/14  Yes WiBrunetta JeansPA-C  amLODipine (NORVASC) 2.5 MG tablet Take 1 tablet (2.5 mg total) by mouth daily. 11/01/14  Yes WiBrunetta JeansPA-C  BIOTIN PO Take 1 tablet by mouth daily.    Yes Historical Provider, MD  carvedilol (COREG) 6.25 MG tablet TAKE 1 TABLET BY MOUTH 2 TIMES A DAY WITH A  MEAL 11/01/14  Yes WiBrunetta JeansPA-C  cephALEXin (KEFLEX) 500 MG capsule TAKE 1 CAPSULE (500 MG TOTAL) BY MOUTH DAILY. MAINTENANCE FOR UTI 11/01/14  Yes WiBrunetta JeansPA-C  cloNIDine (CATAPRES) 0.1 MG tablet Take 1 tablet (0.1 mg total) by mouth as needed. 11/01/14  Yes WiBrunetta JeansPA-C  clopidogrel (PLAVIX) 75 MG tablet Take 1  tablet (75 mg total) by mouth daily. 11/01/14  Yes Brunetta Jeans, PA-C  cyanocobalamin (,VITAMIN B-12,) 1000 MCG/ML injection Inject 1,000 mcg into the muscle every 30 (thirty) days. Last dose was on 01-29-14 01/04/14  Yes Debbrah Alar, NP  cycloSPORINE (RESTASIS) 0.05 % ophthalmic emulsion Place 1 drop into both eyes 2 (two) times daily. Patient taking differently: Place 1 drop into both eyes at bedtime.  10/15/13  Yes Brunetta Jeans, PA-C  donepezil (ARICEPT) 10 MG tablet Take 1.5 tablets (15 mg total) by mouth at bedtime. Takes 1 and 1/2 11/01/14  Yes Brunetta Jeans, PA-C  escitalopram (LEXAPRO) 10 MG tablet Take 1 tablet (10 mg total) by mouth daily. 11/01/14  Yes Brunetta Jeans, PA-C  ezetimibe (ZETIA) 10 MG tablet Take 1 tablet (10 mg total) by mouth daily. 11/01/14  Yes Brunetta Jeans, PA-C  Fluticasone Furoate-Vilanterol (BREO ELLIPTA) 100-25 MCG/INH AEPB Use 1 puff daily as directed. Patient taking differently: Take 1 puff by mouth daily.  11/01/14  Yes Brunetta Jeans, PA-C  ibuprofen (ADVIL,MOTRIN) 200 MG tablet Take 200 mg by mouth every 6 (six) hours as needed for moderate pain (pain).   Yes Historical Provider, MD  levothyroxine (SYNTHROID) 125 MCG tablet TAKE 1 TABLET (125 MCG TOTAL) BY MOUTH DAILY BEFORE BREAKFAST. 11/01/14  Yes Brunetta Jeans, PA-C  mesalamine (LIALDA) 1.2 G EC tablet Take 1 tablet (1.2 g total) by mouth 3 (three) times daily. 11/01/14  Yes Brunetta Jeans, PA-C  naproxen sodium (ANAPROX) 220 MG tablet Take 220 mg by mouth daily as needed (pain).    Yes Historical Provider, MD  pantoprazole (PROTONIX) 40 MG tablet Take 1 tablet (40  mg total) by mouth every morning. 11/01/14  Yes Brunetta Jeans, PA-C  rosuvastatin (CRESTOR) 5 MG tablet Take 1 tablet (5 mg total) by mouth daily. Patient taking differently: Take 5 mg by mouth every other day.  11/01/14  Yes Brunetta Jeans, PA-C  nitroGLYCERIN (NITROSTAT) 0.4 MG SL tablet Place 1 tablet (0.4 mg total) under the tongue every 5 (five) minutes as needed for chest pain. 11/01/14   Brunetta Jeans, PA-C  ondansetron (ZOFRAN) 8 MG tablet Take 1 tablet (8 mg total) by mouth every 8 (eight) hours as needed for nausea or vomiting. 11/01/14   Brunetta Jeans, PA-C   BP 181/68 mmHg  Pulse 59  Temp(Src) 97.8 F (36.6 C) (Oral)  Resp 19  SpO2 95% Physical Exam  Constitutional: She is oriented to person, place, and time. She appears well-developed and well-nourished. No distress.  HENT:  Head: Normocephalic and atraumatic.  Mouth/Throat: Oropharynx is clear and moist. No oropharyngeal exudate.  Eyes: Conjunctivae and EOM are normal. Pupils are equal, round, and reactive to light.  Neck: Normal range of motion. Neck supple.  No meningismus.  Cardiovascular: Normal rate, regular rhythm, normal heart sounds and intact distal pulses.   No murmur heard. Pulmonary/Chest: Effort normal and breath sounds normal. No respiratory distress. She exhibits tenderness.  Tenderness to left lower ribs, no rash no crepitance  Abdominal: Soft. There is tenderness. There is no rebound and no guarding.  Left upper quadrant flank tenderness, no CVA tenderness  Musculoskeletal: Normal range of motion. She exhibits no edema or tenderness.  Neurological: She is alert and oriented to person, place, and time. No cranial nerve deficit. She exhibits normal muscle tone. Coordination normal.  No ataxia on finger to nose bilaterally. No pronator drift. 5/5 strength throughout. CN 2-12 intact. Negative  Romberg. Equal grip strength. Sensation intact. Gait is normal.   Skin: Skin is warm.  Psychiatric: She has a normal  mood and affect. Her behavior is normal.  Nursing note and vitals reviewed.   ED Course  Procedures (including critical care time) Labs Review Labs Reviewed  URINALYSIS, ROUTINE W REFLEX MICROSCOPIC (NOT AT [facility]) - Abnormal; Notable for the following:    Hgb urine dipstick TRACE (*)    All other components within normal limits  COMPREHENSIVE METABOLIC PANEL - Abnormal; Notable for the following:    Glucose, Bld 103 (*)    Albumin 3.3 (*)    Total Bilirubin 0.1 (*)    All other components within normal limits  URINE MICROSCOPIC-ADD ON - Abnormal; Notable for the following:    Squamous Epithelial / LPF FEW (*)    All other components within normal limits  CBC WITH DIFFERENTIAL/PLATELET  TROPONIN I  BRAIN NATRIURETIC PEPTIDE  LIPASE, BLOOD    Imaging Review Dg Chest 2 View  12/04/2014   CLINICAL DATA:  Increasing shortness of breath. Metastatic lung cancer.  EXAM: CHEST  2 VIEW  COMPARISON:  Chest x-ray dated 02/08/2014 and PET-CT dated 06/28/2014  FINDINGS: The heart size and pulmonary vascularity are normal. There has been slight increase in the loculated left pleural effusion. Scarring in the left perihilar region and in the right upper lobe is stable. No acute infiltrates. No visible adenopathy or new mass lesions. No acute osseous abnormality.  IMPRESSION: Slight increase in loculated left pleural effusion. No other change since 02/08/2014.   Electronically Signed   By: Lorriane Shire M.D.   On: 12/04/2014 18:44   Ct Angio Chest Pe W/cm &/or Wo Cm  12/04/2014   CLINICAL DATA:  Chest pain when taking a deep breath. Patient has lung cancer.  EXAM: CT ANGIOGRAPHY CHEST WITH CONTRAST  TECHNIQUE: Multidetector CT imaging of the chest was performed using the standard protocol during bolus administration of intravenous contrast. Multiplanar CT image reconstructions and MIPs were obtained to evaluate the vascular anatomy.  CONTRAST:  136m OMNIPAQUE IOHEXOL 350 MG/ML SOLN  COMPARISON:  PET scan  06/28/2014.  Chest radiograph earlier today.  FINDINGS: Mediastinum: No filling defects are noted within the pulmonary arterial tree to suggest pulmonary emboli. Heart size is normal. No pericardial fluid, thickening or calcification. No acute abnormality of the thoracic aorta or other great vessels of the mediastinum. No pathologically enlarged mediastinal or hilar lymph nodes. Previous precarinal node noted on PET scan now decreased in size 9 mm short axis. RIGHT hilar node also appears decreased. The esophagus is normal in appearance. Post XRT changes LEFT medial mediastinum. Moderate atheromatous change of the transverse arch and great vessel origins.  Lungs/Pleura: No consolidative airspace disease. Moderate-sized LEFT pleural effusion is stable. Previous pulmonary nodules observed on prior PET scan dated 06/28/2014 are stable to slightly improved.  Musculoskeletal: No aggressive appearing lytic or blastic lesions are noted in the visualized portions of the skeleton. Advanced osteopenia. Discal calcifications.  Review of the MIP images confirms the above findings.  IMPRESSION: Moderate-sized pleural effusion on the LEFT is stable.  No evidence for pulmonary emboli.  Stable to decreased size of residual metastatic disease in the chest including adenopathy and lung nodules.   Electronically Signed   By: JRolla FlattenM.D.   On: 12/04/2014 20:20   Dg Humerus Right  12/04/2014   CLINICAL DATA:  RIGHT humeral pain, struck in arm by a dog 2 weeks ago, achy since, remote RIGHT humeral  fracture 10 years ago  EXAM: RIGHT HUMERUS - 2+ VIEW  COMPARISON:  None  FINDINGS: Osseous demineralization.  AC joint alignment normal.  No acute fracture, dislocation, or bone destruction.  IMPRESSION: No acute osseous abnormalities.   Electronically Signed   By: Lavonia Dana M.D.   On: 12/04/2014 18:40   Ct Renal Stone Study  12/04/2014   CLINICAL DATA:  Left flank pain.  History of lung cancer.  EXAM: CT ABDOMEN AND PELVIS WITHOUT  CONTRAST  TECHNIQUE: Multidetector CT imaging of the abdomen and pelvis was performed following the standard protocol without IV contrast.  COMPARISON:  PET-CT scan dated 06/28/2014 and CT scan dated 06/01/2013  FINDINGS: There is a small loculated effusion at the left lung base, diminished since the prior PET-CT scan.  Liver, biliary tree, spleen, pancreas, and adrenal glands are normal. Small cysts in each kidney. Small stones in the right renal pelvis. No hydronephrosis. No ureteral or bladder calculi.  There are multiple diverticula in the left side of the colon without diverticulitis. There is a 3 cm cyst on the right ovary, unchanged. Left ovary is normal. Uterus is been removed. No acute osseous abnormality. Diffuse degenerative disc disease in the lumbar spine. Right total hip prosthesis.  No adenopathy or free air or free fluid. Extensive atherosclerotic disease of the abdominal aorta and iliac arteries.  IMPRESSION: No acute abnormality of the abdomen. Stones in the right renal pelvis.  Chronic loculated effusion at the left lung base, diminished.  Diverticulosis   Electronically Signed   By: Lorriane Shire M.D.   On: 12/04/2014 20:15     EKG Interpretation   Date/Time:  Sunday December 04 2014 17:48:43 EDT Ventricular Rate:  57 PR Interval:  220 QRS Duration: 93 QT Interval:  443 QTC Calculation: 431 R Axis:   82 Text Interpretation:  Sinus rhythm Prolonged PR interval Borderline right  axis deviation No significant change was found Confirmed by Wyvonnia Dusky  MD,  Daylen Lipsky [phone]) on 12/04/2014 6:05:46 PM      MDM   Final diagnoses:  Flank pain  Abdominal pain, unspecified abdominal location   Intermittent flank and left-sided abdominal pain for the past 3 days. History of lung cancer.  Chest x-ray shows stable pleural effusion. UA negative for hematuria  No evidence of PE on CT. No kidney stone. Patient maintains saturations at 96-97% with ambulating.  Her pleural effusion seems to be  stable since December. She is not hypoxic or in respiratory distress. This may be the source of her pain. She does not require thoracentesis at this time. She'll follow-up with her pulmonologist.  It's also possible her pain may be from zoster but rash is not yet visible. Return precautions discussed.  She is comfortable going home.   Ezequiel Essex, MD 12/05/14 [phone]

## 2014-12-04 NOTE — Discharge Instructions (Signed)
Abdominal Pain Follow up with Dr. Lamonte Sakai.  Return to the ED if you develop chest pain, shortness of breath or any other concerns. Many things can cause abdominal pain. Usually, abdominal pain is not caused by a disease and will improve without treatment. It can often be observed and treated at home. Your health care provider will do a physical exam and possibly order blood tests and X-rays to help determine the seriousness of your pain. However, in many cases, more time must pass before a clear cause of the pain can be found. Before that point, your health care provider may not know if you need more testing or further treatment. HOME CARE INSTRUCTIONS  Monitor your abdominal pain for any changes. The following actions may help to alleviate any discomfort you are experiencing:  Only take over-the-counter or prescription medicines as directed by your health care provider.  Do not take laxatives unless directed to do so by your health care provider.  Try a clear liquid diet (broth, tea, or water) as directed by your health care provider. Slowly move to a bland diet as tolerated. SEEK MEDICAL CARE IF:  You have unexplained abdominal pain.  You have abdominal pain associated with nausea or diarrhea.  You have pain when you urinate or have a bowel movement.  You experience abdominal pain that wakes you in the night.  You have abdominal pain that is worsened or improved by eating food.  You have abdominal pain that is worsened with eating fatty foods.  You have a fever. SEEK IMMEDIATE MEDICAL CARE IF:   Your pain does not go away within 2 hours.  You keep throwing up (vomiting).  Your pain is felt only in portions of the abdomen, such as the right side or the left lower portion of the abdomen.  You pass bloody or black tarry stools. MAKE SURE YOU:  Understand these instructions.   Will watch your condition.   Will get help right away if you are not doing well or get worse.   Document Released: 03/27/2005 Document Revised: 06/22/2013 Document Reviewed: 02/24/2013 [facility] Patient Information 2015 [place], [place]. This information is not intended to replace advice given to you by your health care provider. Make sure you discuss any questions you have with your health care provider.

## 2015-01-03 ENCOUNTER — Other Ambulatory Visit: Payer: Self-pay | Admitting: Physician Assistant

## 2015-03-13 ENCOUNTER — Other Ambulatory Visit: Payer: Self-pay | Admitting: Physician Assistant

## 2015-03-13 NOTE — Telephone Encounter (Signed)
Medication Detail      Disp Refills Start End     levothyroxine (SYNTHROID) 125 MCG tablet 90 tablet 0 11/01/2014     Sig: TAKE 1 TABLET (125 MCG TOTAL) BY MOUTH DAILY BEFORE BREAKFAST.    Notes to Pharmacy: D/C PREVIOUS SCRIPTS FOR THIS MEDICATION    E-Prescribing Status: Receipt confirmed by pharmacy (11/01/2014 3:54 PM EDT)     [place], [place]   LOV: 05.03.16 ROV: 6-Mths Refill sent per [facility] refill protocol/SLS

## 2015-03-15 ENCOUNTER — Other Ambulatory Visit: Payer: Self-pay | Admitting: Physician Assistant

## 2015-03-15 MED ORDER — CEPHALEXIN 500 MG PO CAPS: ORAL_CAPSULE | ORAL | Status: DC

## 2015-03-22 ENCOUNTER — Telehealth: Payer: Self-pay | Admitting: Radiation Oncology

## 2015-03-22 ENCOUNTER — Ambulatory Visit: Payer: Medicare Other | Admitting: Emergency Medicine

## 2015-03-22 ENCOUNTER — Other Ambulatory Visit: Payer: Self-pay | Admitting: Radiation Oncology

## 2015-03-22 DIAGNOSIS — C3432 Malignant neoplasm of lower lobe, left bronchus or lung: Secondary | ICD-10-CM

## 2015-03-22 NOTE — Telephone Encounter (Signed)
Patient left message requesting return call. Phoned patient's home. Patient reports she is experiencing left hip, leg and knee pain. Reports she was seen by Dr. Lorre Nick of Sports Medicine yesterday. She states, "He did x-rays but, didn't see anything." One of the patient's daughter is requesting Dr. Isidore Moos schedule a scan to have the pain further evaluated. Patient reports taking Motrin 800 mg to manage pain. Patient reports her SOB is unchanged. Reports she is scheduled to follow up with he pulmonologist at 4 pm today. Patient understands this RN will inform Dr. Isidore Moos of these findings and phone her back with further directions.

## 2015-03-23 ENCOUNTER — Telehealth: Payer: Self-pay | Admitting: *Deleted

## 2015-03-23 NOTE — Telephone Encounter (Signed)
CALLED PATIENT TO INFORM OF PET ON 04-05-15 AND HER FU ON 04-07-15, SPOKE WITH PATIENT'S DAUGHTER LOU ERHARDT AND SHE IS AWARE OF THESE APPTS.

## 2015-03-24 ENCOUNTER — Encounter: Payer: Self-pay | Admitting: Emergency Medicine

## 2015-03-24 ENCOUNTER — Ambulatory Visit (INDEPENDENT_AMBULATORY_CARE_PROVIDER_SITE_OTHER): Payer: Medicare Other | Admitting: Emergency Medicine

## 2015-03-24 ENCOUNTER — Ambulatory Visit (INDEPENDENT_AMBULATORY_CARE_PROVIDER_SITE_OTHER)
Admission: RE | Admit: 2015-03-24 | Discharge: 2015-03-24 | Disposition: A | Discharge status: Home / Self Care | Payer: Medicare Other | Source: Ambulatory Visit | Attending: Emergency Medicine | Admitting: Emergency Medicine

## 2015-03-24 VITALS — BP 142/60 | HR 65 | Ht 71.0 in | Wt 193.0 lb

## 2015-03-24 DIAGNOSIS — C3490 Malignant neoplasm of unspecified part of unspecified bronchus or lung: Secondary | ICD-10-CM | POA: Diagnosis not present

## 2015-03-24 DIAGNOSIS — J90 Pleural effusion, not elsewhere classified: Secondary | ICD-10-CM

## 2015-03-24 DIAGNOSIS — J449 Chronic obstructive pulmonary disease, unspecified: Secondary | ICD-10-CM | POA: Diagnosis not present

## 2015-03-24 DIAGNOSIS — J910 Malignant pleural effusion: Secondary | ICD-10-CM | POA: Diagnosis not present

## 2015-03-24 DIAGNOSIS — I639 Cerebral infarction, unspecified: Secondary | ICD-10-CM

## 2015-03-24 DIAGNOSIS — Z23 Encounter for immunization: Secondary | ICD-10-CM

## 2015-03-24 MED ORDER — OXYCODONE HCL 5 MG PO TABS: 5.0000 mg | ORAL_TABLET | Freq: Four times a day (QID) | ORAL | Status: DC | PRN

## 2015-03-24 NOTE — Patient Instructions (Addendum)
We will stay on Breo once a day for now CXR today Depending on your CXR we could consider a repeat thoracentesis to see if helps with your breathing  Depending on your upcoming PET scan 10/5 you may need to discuss Palliative Radiation treatment with Dr Isidore Moos.  We will start oxycodone to help with your hip pain.  Flu shot today Follow with Dr Lamonte Sakai in 1 month

## 2015-03-24 NOTE — Assessment & Plan Note (Signed)
Chest x-ray as above

## 2015-03-24 NOTE — Assessment & Plan Note (Signed)
Managing conservatively with plans for palliation if necessary. I do not hear evidence on exam for significant left-sided pleural effusion so I do not believe that accumulation is responsible for her increased dyspnea. Will check a chest x-ray today. She is scheduled for PET scan on 04/05/15. If we believe she can benefit from thoracentesis and we will arrange this

## 2015-03-24 NOTE — Progress Notes (Signed)
Subjective:    Patient ID: Jodi Frazier, female    DOB: 09/28/1930, 81 y.o.   MRN: [id number]  HPI 81 yo woman, former smoker (40 pk-yrs), HTN, CAD, adenoCA LLL s/p XRT 3-5/'14, COPD, rhinitis, GERD. She is on Advair + albuterol prn. She has had some increased cough and dyspnea since the end of XRT. Her CT scan chest done 02/22/13 shows shrinkage of LLL mass but some associated GGI in the L lung. She was just started on a pred taper by Dr Lanell Persons for this.   ROV 04/08/13 -- hx adenoCA LLL, COPD. She has undergone XRT to LLL lesion, was seen last time with some associated GGI suspicious for radiation pneumonitis. We treated with prednisone, she never tapered to '40mg'$ , has been on '60mg'$  qd. She is coughing more since last time. Taking advair qd, albuterol prn (2x a week).   ROV 05/04/13 -- hx adenoCA LLL, COPD. She has undergone XRT to LLL lesion, has been rx with pred for suspected radiation pneumonitis / GGI.  At last visit we tapered prednisone to off. We also stopped Advair and start of Breo. We also started protonix and added an allergy regimen for her cough. She has been admitted in the interim for PTX, received more prednisone and is currently tapering. CXR today shows some clearing. She believes that [name] is helping her. She still has nasal gtt.   ROV 06/10/13 --  COPD, hx adenoCA LLL s/p XRT and treated for radiation pneumonitis in 10-05/2013. Hx PTX.Marland Kitchen Also with cough > protonix. ? Loratadine and chlorpheniramine > stopped these. She c/o nausea today, ? Related to increasing the protonic to qd, ? Breo. Her cough appears to better controlled.   Acute OV 11/16/13 -- hx adenoCA LLL s/p XRT and treated for radiation pneumonitis in 10-05/2013. Hx PTX. Also with COPD and allergic rhinitis.  She c/o of dizziness and more dyspnea for the last 7-10 days. She may have some more LE swelling. She just finished 3 XRT to the RUL in March. Has not had and does not want chemo.    ROV 12/14/13 -- hx adenoCA LLL  s/p XRT and treated for radiation pneumonitis in 10-05/2013, followed with Dr Melvyn Novas 5/26 for CP after thora, lung didn't come up completely. The thora didn't change her breathing much at all.   ROV 05/04/14 -- hx adenoCA s/p XRT c/b radiation pneumonitis. Underwent L thora 5/'15 without any changes in her breathing. She had a recent small CVA. She underwent CT chest 10/23 > new areas of nodularity, new LUL soft tissue mass, smaller LLL mass, R hilar adenopathy.  She has spoken with Dr Isidore Moos and is planning for a PET scan - she has deferred this to after the holidays. She is on Breo, seems to be tolerating. She has not needed her SABA except twice ever.               ROV 11/29/14 -- follow-up visit for history of COPD. She also follows with Dr. Isidore Moos for adenocarcinoma of the lung status post radiation therapy and complicated some radiation pneumonitis. She underwent PET scan in 06/28/14 that showed recurrent metastatic disease and enlarging hypermetabolic thoracic nodes.   She has decided not to pursue any more chemotherapy or radiation therapy.  She is feeling well - no current clinical manifestations of her cancer.  She is on Breo, believes that it is helpful.                ROV 03/24/15 --  Follow-up visit for patient with a history of adenocarcinoma of the lung as well as COPD. Currently being treated with Breo,   . Her dyspnea may be a bit worse. Some slight increase in cough. Decreased functional capacity.  She started having L hip pain 6 weeks ago, then started bothering her knees. She is planning for repeat PET scan with Dr Isidore Moos, ? Bony involvement that would benefit from palliation.                                                CAT Score 03/24/2015  Total CAT Score 22                                            Review of Systems  Constitutional: Negative for fever and unexpected weight change.  HENT: Positive for rhinorrhea. Negative for congestion, dental problem, ear pain, nosebleeds,  postnasal drip, sinus pressure, sneezing, sore throat and trouble swallowing.   Eyes: Negative for redness and itching.  Respiratory: Positive for cough and shortness of breath. Negative for chest tightness and wheezing.   Cardiovascular: Negative for palpitations and leg swelling.  Gastrointestinal: Negative for nausea and vomiting.  Genitourinary: Negative for dysuria.  Musculoskeletal: Negative for joint swelling.  Skin: Negative for rash.  Neurological: Positive for light-headedness. Negative for headaches.  Hematological: Does not bruise/bleed easily.  Psychiatric/Behavioral: Negative for dysphoric mood. The patient is not nervous/anxious.      Filed Vitals:   03/24/15 1055  BP: 142/60  Pulse: 65  Height: '5\' 11"'$  (1.803 m)  Weight: 193 lb (87.544 kg)  SpO2: 92%    Gen: Pleasant, elderly woman in a wheelchair, in no distress,  normal affect  ENT: No lesions,  mouth clear,  oropharynx clear, no postnasal drip  Neck: No JVD, no TMG, no carotid bruits  Lungs: No use of accessory muscles, clear without rales or rhonchi, no decreased BS on L, no evidence large pleural effusion.   Cardiovascular: RRR, heart sounds normal, no murmur or gallops, trace peripheral edema  Musculoskeletal: No deformities, no cyanosis or clubbing  Neuro: alert, non focal    12/04/14 --  COMPARISON: PET scan 06/28/2014. Chest radiograph earlier today.  FINDINGS: Mediastinum: No filling defects are noted within the pulmonary arterial tree to suggest pulmonary emboli. Heart size is normal. No pericardial fluid, thickening or calcification. No acute abnormality of the thoracic aorta or other great vessels of the mediastinum. No pathologically enlarged mediastinal or hilar lymph nodes. Previous precarinal node noted on PET scan now decreased in size 9 mm short axis. RIGHT hilar node also appears decreased. The esophagus is normal in appearance. Post XRT changes LEFT medial mediastinum. Moderate  atheromatous change of the transverse arch and great vessel origins.  Lungs/Pleura: No consolidative airspace disease. Moderate-sized LEFT pleural effusion is stable. Previous pulmonary nodules observed on prior PET scan dated 06/28/2014 are stable to slightly improved.  Musculoskeletal: No aggressive appearing lytic or blastic lesions are noted in the visualized portions of the skeleton. Advanced osteopenia. Discal calcifications.  Review of the MIP images confirms the above findings.  IMPRESSION: Moderate-sized pleural effusion on the LEFT is stable.  No evidence for pulmonary emboli.  Stable to decreased size of residual metastatic disease in the chest  including adenopathy and lung nodules.      Objective:   Physical Exam Filed Vitals:   03/24/15 1055  BP: 142/60  Pulse: 65  Height: '5\' 11"'$  (1.803 m)  Weight: 193 lb (87.544 kg)  SpO2: 92%       Assessment & Plan:  Adenocarcinoma of lung Managing conservatively with plans for palliation if necessary. I do not hear evidence on exam for significant left-sided pleural effusion so I do not believe that accumulation is responsible for her increased dyspnea. Will check a chest x-ray today. She is scheduled for PET scan on 04/05/15. If we believe she can benefit from thoracentesis and we will arrange this  COPD (chronic obstructive pulmonary disease) Currently managed on Breo once a day. Could consider changing her to an alternative such as Anoro or Stiolto. Depending on her chest x-ray results and our suspicion that her dyspnea is related to pleural effusion we will decide whether to make such change. I will continue the [facility] for now.   Malignant pleural effusion Chest x-ray as above

## 2015-03-24 NOTE — Assessment & Plan Note (Signed)
Currently managed on Breo once a day. Could consider changing her to an alternative such as Anoro or Stiolto. Depending on her chest x-ray results and our suspicion that her dyspnea is related to pleural effusion we will decide whether to make such change. I will continue the [facility] for now.

## 2015-03-28 ENCOUNTER — Telehealth: Payer: Self-pay | Admitting: *Deleted

## 2015-03-28 NOTE — Telephone Encounter (Signed)
CALLED PATIENT'S DAUGHTER (LOU ERNHARDT) TO INFORM PET HAS BEEN MOVED TO 04-04-15 FROM 04-05-15 AND HER FU VISIT MOVED FROM 04-07-15 TO 04-05-15, PATIENT'S DAUGHTER WAS AGREEABLE TO THIS

## 2015-04-04 ENCOUNTER — Ambulatory Visit (HOSPITAL_COMMUNITY)
Admission: RE | Admit: 2015-04-04 | Discharge: 2015-04-04 | Disposition: A | Discharge status: Home / Self Care | Payer: Medicare Other | Source: Ambulatory Visit | Attending: Radiation Oncology | Admitting: Radiation Oncology

## 2015-04-04 DIAGNOSIS — K5730 Diverticulosis of large intestine without perforation or abscess without bleeding: Secondary | ICD-10-CM | POA: Diagnosis not present

## 2015-04-04 DIAGNOSIS — C3432 Malignant neoplasm of lower lobe, left bronchus or lung: Secondary | ICD-10-CM | POA: Diagnosis present

## 2015-04-04 DIAGNOSIS — Z79899 Other long term (current) drug therapy: Secondary | ICD-10-CM | POA: Diagnosis not present

## 2015-04-04 DIAGNOSIS — M87852 Other osteonecrosis, left femur: Secondary | ICD-10-CM | POA: Diagnosis not present

## 2015-04-04 DIAGNOSIS — N281 Cyst of kidney, acquired: Secondary | ICD-10-CM | POA: Diagnosis not present

## 2015-04-04 DIAGNOSIS — E079 Disorder of thyroid, unspecified: Secondary | ICD-10-CM | POA: Insufficient documentation

## 2015-04-04 DIAGNOSIS — M87821 Other osteonecrosis, right humerus: Secondary | ICD-10-CM | POA: Diagnosis not present

## 2015-04-04 DIAGNOSIS — R590 Localized enlarged lymph nodes: Secondary | ICD-10-CM | POA: Insufficient documentation

## 2015-04-04 DIAGNOSIS — I700 Atherosclerosis of aorta: Secondary | ICD-10-CM | POA: Insufficient documentation

## 2015-04-04 DIAGNOSIS — J90 Pleural effusion, not elsewhere classified: Secondary | ICD-10-CM | POA: Insufficient documentation

## 2015-04-04 LAB — GLUCOSE, CAPILLARY: GLUCOSE-CAPILLARY: 94 mg/dL (ref 65–99)

## 2015-04-04 MED ORDER — FLUDEOXYGLUCOSE F - 18 (FDG) INJECTION: 10.8400 | Freq: Once | INTRAVENOUS | Status: DC | PRN

## 2015-04-04 NOTE — Progress Notes (Signed)
Left lower lung poorly differentiated adenocarcinoma, T2aN1M0 STAGE IIA, with new right upper lung nodule  TREATMENT DATES: 08-20-13 to 08-23-13  SITE/DOSE: right upper lung nodule / 54 Gy in 3 fractions  ALSO- s/p external radiation to 70 Gy in 35 fractions through 11/20/12 (she opted NOT to receive chemotherapy)   Ms. Jodi Frazier presents for follow-up. She reports she has had pain at a level 10 for two months that started in her left hip and has moved to her legs, and right arm. She reports decreased energy and weakness, and needs assist from her daughter for most of her daily activities. She reports a good appetite. She does report a depressed mood with fear of the unknown. She reports some shortness of breath with activity and her oxygen saturation was 98 on room air today. BP 155/50 mmHg  Pulse 58  Temp(Src) 97.6 F (36.4 C)  Ht '5\' 11"'$  (1.803 m)  Wt 194 lb (87.998 kg)  BMI 27.07 kg/m2  Wt Readings from Last 3 Encounters:  04/05/15 194 lb (87.998 kg)  03/24/15 193 lb (87.544 kg)  11/29/14 191 lb (86.637 kg)

## 2015-04-05 ENCOUNTER — Ambulatory Visit (HOSPITAL_COMMUNITY): Payer: Medicare Other

## 2015-04-05 ENCOUNTER — Ambulatory Visit
Admission: RE | Admit: 2015-04-05 | Discharge: 2015-04-05 | Disposition: A | Discharge status: Home / Self Care | Payer: Medicare Other | Source: Ambulatory Visit | Attending: Radiation Oncology | Admitting: Radiation Oncology

## 2015-04-05 VITALS — BP 155/50 | HR 58 | Temp 97.6°F | Ht 71.0 in | Wt 194.0 lb

## 2015-04-05 DIAGNOSIS — M8700 Idiopathic aseptic necrosis of unspecified bone: Secondary | ICD-10-CM

## 2015-04-05 DIAGNOSIS — C3432 Malignant neoplasm of lower lobe, left bronchus or lung: Secondary | ICD-10-CM

## 2015-04-05 NOTE — Progress Notes (Signed)
Radiation Oncology         [phone] ________________________________  Name: Jodi Frazier MRN: [id number]  Date: 04/05/2015  DOB: May 14, 1931  Follow-Up Visit Note  Outpatient  CC: Leeanne Rio, PA-C  Gaye Pollack, MD  Diagnosis and Prior Radiotherapy:    ICD-9-CM ICD-10-CM   1. Primary malignant neoplasm of left lower lobe of lung (HCC) 162.5 C34.32      Left lower lung poorly differentiated adenocarcinoma, T2aN1M0 STAGE IIA, with new right upper lung nodule  TREATMENT DATES: 08-20-13 to 08-23-13  SITE/DOSE: right upper lung nodule / 54 Gy in 3 fractions  ALSO- s/p external radiation to 70 Gy in 35 fractions through 11/20/12 (she opted NOT to receive chemotherapy)    Narrative:   Ms. Jodi Frazier presents for follow-up. She reports she has had pain at a level 10 for two months that started in her left hip and has moved to her legs, and right arm. Her right arm has been x-rayed after she believed her daughter's dog had broke it, it hurt ever since. Her pain is exacerbated by movement, stating when she is in her recliner at home she feels no pain. She reports decreased energy and weakness, and needs assist from her daughter for most of her daily activities. She reports a good appetite. She does report a depressed mood with fear of the unknown. She reports some shortness of breath with activity and her oxygen saturation was 98 on room air today. Her orthopedic surgeon did not see anything on x-ray a few weeks ago. She stopped her high dose steroid medication 10-12 years ago. She was prescribed oxycodone 5-10 mg every six hours as needed, by Dr. Lamonte Sakai.  PET scan was performed earlier this week: shows possible avascular necrosis in the right humeral head and left femoral head. Right humeral shaft unremarkable.The disease in her chest appears relatively stable. There is increasing hypermetabolic activity at the right hip joint.    Years ago, she was chronically on high dose  steroids, per her daughter.   ALLERGIES:  is allergic to codeine; augmentin; and penicillins.  Meds: Current Outpatient Prescriptions  Medication Sig Dispense Refill  . albuterol (PROVENTIL HFA;VENTOLIN HFA) 108 (90 BASE) MCG/ACT inhaler Inhale 2 puffs into the lungs every 6 (six) hours as needed for wheezing. 1 Inhaler 3  . ALPRAZolam (XANAX) 0.25 MG tablet Take 1 tablet (0.25 mg total) by mouth 2 (two) times daily as needed for anxiety. 30 tablet 0  . amitriptyline (ELAVIL) 25 MG tablet Take 1 tablet (25 mg total) by mouth at bedtime. 90 tablet 1  . amLODipine (NORVASC) 2.5 MG tablet Take 1 tablet (2.5 mg total) by mouth daily. 90 tablet 1  . BIOTIN PO Take 1 tablet by mouth daily.     . carvedilol (COREG) 6.25 MG tablet TAKE 1 TABLET BY MOUTH 2 TIMES A DAY WITH A MEAL 180 tablet 1  . cephALEXin (KEFLEX) 500 MG capsule TAKE 1 CAPSULE (500 MG) BY MOUTH DAILY. MAINTENANCE FOR UTI 90 capsule 0  . cloNIDine (CATAPRES) 0.1 MG tablet Take 1 tablet (0.1 mg total) by mouth as needed. 90 tablet 1  . clopidogrel (PLAVIX) 75 MG tablet Take 1 tablet (75 mg total) by mouth daily. 90 tablet 1  . cyanocobalamin (,VITAMIN B-12,) 1000 MCG/ML injection Inject 1,000 mcg into the muscle every 30 (thirty) days. Last dose was on 01-29-14    . cycloSPORINE (RESTASIS) 0.05 % ophthalmic emulsion Place 1 drop into both eyes 2 (two)  times daily. (Patient taking differently: Place 1 drop into both eyes at bedtime. ) 0.4 mL 0  . donepezil (ARICEPT) 10 MG tablet Take 1.5 tablets (15 mg total) by mouth at bedtime. Takes 1 and 1/2 135 tablet 0  . escitalopram (LEXAPRO) 10 MG tablet Take 1 tablet (10 mg total) by mouth daily. 90 tablet 1  . ezetimibe (ZETIA) 10 MG tablet Take 1 tablet (10 mg total) by mouth daily. 90 tablet 1  . Fluticasone Furoate-Vilanterol (BREO ELLIPTA) 100-25 MCG/INH AEPB Use 1 puff daily as directed. (Patient taking differently: Take 1 puff by mouth daily. ) 60 each 0  . ibuprofen (ADVIL,MOTRIN) 200 MG  tablet Take 200 mg by mouth every 6 (six) hours as needed for moderate pain (pain).    . mesalamine (LIALDA) 1.2 G EC tablet Take 1 tablet (1.2 g total) by mouth 3 (three) times daily. 270 tablet 1  . nitroGLYCERIN (NITROSTAT) 0.4 MG SL tablet Place 1 tablet (0.4 mg total) under the tongue every 5 (five) minutes as needed for chest pain. 30 tablet 1  . ondansetron (ZOFRAN) 8 MG tablet Take 1 tablet (8 mg total) by mouth every 8 (eight) hours as needed for nausea or vomiting. 20 tablet 3  . oxyCODONE (OXY IR/ROXICODONE) 5 MG immediate release tablet Take 1-2 tablets (5-10 mg total) by mouth every 6 (six) hours as needed for severe pain. 100 tablet 0  . pantoprazole (PROTONIX) 40 MG tablet Take 1 tablet (40 mg total) by mouth every morning. 90 tablet 1  . rosuvastatin (CRESTOR) 5 MG tablet Take 1 tablet (5 mg total) by mouth daily. (Patient taking differently: Take 5 mg by mouth every other day. ) 90 tablet 1  . SYNTHROID 125 MCG tablet TAKE 1 TABLET (125 MCG TOTAL) BY MOUTH DAILY BEFORE BREAKFAST. 90 tablet 0  . naproxen sodium (ANAPROX) 220 MG tablet Take 220 mg by mouth daily as needed (pain).      No current facility-administered medications for this encounter.   Facility-Administered Medications Ordered in Other Encounters  Medication Dose Route Frequency Provider Last Rate Last Dose  . fludeoxyglucose F - 18 (FDG) injection 10.84 milli Curie  10.84 milli Curie Intravenous Once PRN Medication Radiologist, MD        Physical Findings: The patient is in no acute distress. Patient is alert and oriented.  height is '5\' 11"'$  (1.803 m) and weight is 194 lb (87.998 kg). Her temperature is 97.6 F (36.4 C). Her blood pressure is 155/50 and her pulse is 58. Her oxygen saturation is 98%. .    General: Alert and oriented, in no acute distress. Sitting in wheelchair. HEENT: Head is normocephalic. Neck: No palpable masses in her cervical or supraclavicular. Heart: Regular in rate and rhythm with no  murmurs, rubs, or gallops. Chest: Clear to auscultation bilaterally, with no rhonchi, wheezes, or rales. Neurologic: Cranial nerves II through XII are grossly intact. Speech is fluent. Psychiatric: Judgment and insight are intact. Affect is appropriate.   Lab Findings: Lab Results  Component Value Date   WBC 6.7 12/04/2014   HGB 12.1 12/04/2014   HCT 37.5 12/04/2014   MCV 84.1 12/04/2014   PLT 281 12/04/2014    Radiographic Findings: Nm Pet Image Restag (ps) Skull Base To Thigh  04/04/2015   CLINICAL DATA:  Restaging treatment strategy for lung cancer, left lower lobe.  EXAM: NUCLEAR MEDICINE PET SKULL BASE TO THIGH  TECHNIQUE: 10.8 mCi F-18 FDG was injected intravenously. Full-ring PET imaging was performed from  the skull base to thigh after the radiotracer. CT data was obtained and used for attenuation correction and anatomic localization.  FASTING BLOOD GLUCOSE:  Value: 94 mg/dl  COMPARISON:  06/28/2014  FINDINGS: NECK  Small hypermetabolic thyroid gland likely from thyroiditis. This is stable.  CHEST  Hypermetabolic mediastinal lymph nodes with hypermetabolic left upper lobe anterior pulmonary nodules, index 9 by 5 mm pulmonary nodule on image 72 series 4 with maximum standard uptake value 8.0, formerly 8.7. Scar-like band of density in the right upper lobe with maximum standard uptake value 5.3, formerly 3.5. Lymph node anterior to the carina with maximum standard uptake value 7.2 (formerly 5.2) has short axis diameter of 1.3 cm on image 68 series 4.  Left perihilar atelectasis or radiation pneumonitis with some mild left hilar adenopathy. Stable left pleural effusion.  Airway plugging anteriorly in the right middle lobe with some associated faintly increased metabolic activity, measuring up to 4.1 maximum standard uptake value medially in the right middle lobe.  ABDOMEN/PELVIS  No abnormal hypermetabolic activity within the liver, pancreas, adrenal glands, or spleen. No hypermetabolic lymph  nodes in the abdomen or pelvis. Diffuse physiologic high activity in the bowel. Aortoiliac atherosclerotic vascular disease. Left kidney upper pole photopenic cyst. Right adnexal photopenic cystic lesion. Sigmoid diverticulosis.  SKELETON  There is avascular necrosis of the right humeral head and left femoral head associated with joint effusions and synovitis as well as high activity in both of these joints. There is also high and increased activity along the right hip joint in the vicinity of the right femoral prosthesis.  IMPRESSION: 1. The hypermetabolic left anterior pulmonary nodules are similar in degree of metabolism compared to prior. Differential diagnostic considerations include malignancy and active granulomatous process. There is some new faint hypermetabolic activity associated with airway plugging in the right middle lobe. Similar degree of hypermetabolic activity in the mediastinal adenopathy. Scattered scarring in the lungs with stable left pleural effusion. 2. New avascular necrosis of the right humeral head and left femoral head with associated extensive synovitis and diffuse joint hypermetabolic activity in these 2 joints. There is also some increasing hypermetabolic activity along the right hip joint where there is a hip implant. Strictly speaking, a granulomatous infectious process of the joints is not entirely excluded, although avascular necrosis would be an unusual association. Correlate with risk factors for AVN. 3. Small hypermetabolic thyroid gland suggesting thyroiditis.   Electronically Signed   By: Van Clines M.D.   On: 04/04/2015 15:28     Impression/Plan:  There are no signs of cancer progression at this time. Possible avascular necrosis found on PET scan, corresponding to her complaints/pain.   I have referred Ms. Stegeman back to her orthopedic surgeon, Dr[facility] Lara Mulch at [facility], to discuss her recent PET scan results. We will send the PET images to him.  The patient  is scheduled to see her chiropractor tomorrow, 04/06/15.  We will follow up in 4 months without scans - pt prefers scans only PRN.        This document serves as a record of services personally performed by Eppie Gibson, MD. It was created on her behalf by Arlyce Harman, a trained medical scribe. The creation of this record is based on the scribe's personal observations and the provider's statements to them. This document has been checked and approved by the attending provider. _____________________________________   Eppie Gibson, MD

## 2015-04-06 ENCOUNTER — Telehealth: Payer: Self-pay | Admitting: *Deleted

## 2015-04-06 NOTE — Telephone Encounter (Signed)
Called patient to inform of appt. With Dr. Ronnie Derby on 04-13-15 - arrival time - 2:30 pm, spoke with patient and she is aware of this appt.

## 2015-04-07 ENCOUNTER — Ambulatory Visit: Payer: Self-pay | Admitting: Radiation Oncology

## 2015-04-28 ENCOUNTER — Encounter: Payer: Self-pay | Admitting: Emergency Medicine

## 2015-04-28 ENCOUNTER — Ambulatory Visit (INDEPENDENT_AMBULATORY_CARE_PROVIDER_SITE_OTHER): Payer: Medicare Other | Admitting: Emergency Medicine

## 2015-04-28 VITALS — BP 118/70 | HR 59 | Ht 71.0 in | Wt 187.6 lb

## 2015-04-28 DIAGNOSIS — J90 Pleural effusion, not elsewhere classified: Secondary | ICD-10-CM

## 2015-04-28 DIAGNOSIS — J948 Other specified pleural conditions: Secondary | ICD-10-CM | POA: Diagnosis not present

## 2015-04-28 DIAGNOSIS — I639 Cerebral infarction, unspecified: Secondary | ICD-10-CM | POA: Diagnosis not present

## 2015-04-28 DIAGNOSIS — J449 Chronic obstructive pulmonary disease, unspecified: Secondary | ICD-10-CM

## 2015-04-28 DIAGNOSIS — C3432 Malignant neoplasm of lower lobe, left bronchus or lung: Secondary | ICD-10-CM

## 2015-04-28 MED ORDER — OXYCODONE HCL 5 MG PO TABS: 5.0000 mg | ORAL_TABLET | Freq: Four times a day (QID) | ORAL | Status: DC | PRN

## 2015-04-28 NOTE — Addendum Note (Signed)
Addended by: Desmond Dike C on: 04/28/2015 04:50 PM   Modules accepted: Orders

## 2015-04-28 NOTE — Assessment & Plan Note (Signed)
Her pleural effusion is similar in size to her previous films based on her current PET scan. I do not believe we need to pursue therapeutic thoracentesis at this time. We will continue to follow the effusion and plan to drain it if we believe it's causing symptoms.

## 2015-04-28 NOTE — Progress Notes (Signed)
Subjective:    Patient ID: Jodi Frazier, female    DOB: [birth year], 81 y.o.   MRN: [id number]  HPI 81 yo woman, former smoker (40 pk-yrs), HTN, CAD, adenoCA LLL s/p XRT 3-5/'14, COPD, rhinitis, GERD. She is on Advair + albuterol prn. She has had some increased cough and dyspnea since the end of XRT. Her CT scan chest done 02/22/13 shows shrinkage of LLL mass but some associated GGI in the L lung. She was just started on a pred taper by Dr Lanell Persons for this.   ROV 04/08/13 -- hx adenoCA LLL, COPD. She has undergone XRT to LLL lesion, was seen last time with some associated GGI suspicious for radiation pneumonitis. We treated with prednisone, she never tapered to '40mg'$ , has been on '60mg'$  qd. She is coughing more since last time. Taking advair qd, albuterol prn (2x a week).   ROV 05/04/13 -- hx adenoCA LLL, COPD. She has undergone XRT to LLL lesion, has been rx with pred for suspected radiation pneumonitis / GGI.  At last visit we tapered prednisone to off. We also stopped Advair and start of Breo. We also started protonix and added an allergy regimen for her cough. She has been admitted in the interim for PTX, received more prednisone and is currently tapering. CXR today shows some clearing. She believes that [name] is helping her. She still has nasal gtt.   ROV 06/10/13 --  COPD, hx adenoCA LLL s/p XRT and treated for radiation pneumonitis in 10-05/2013. Hx PTX.Marland Kitchen Also with cough > protonix. ? Loratadine and chlorpheniramine > stopped these. She c/o nausea today, ? Related to increasing the protonic to qd, ? Breo. Her cough appears to better controlled.   Acute OV 11/16/13 -- hx adenoCA LLL s/p XRT and treated for radiation pneumonitis in 10-05/2013. Hx PTX. Also with COPD and allergic rhinitis.  She c/o of dizziness and more dyspnea for the last 7-10 days. She may have some more LE swelling. She just finished 3 XRT to the RUL in March. Has not had and does not want chemo.    ROV 12/14/13 -- hx adenoCA LLL  s/p XRT and treated for radiation pneumonitis in 10-05/2013, followed with Dr Melvyn Novas 5/26 for CP after thora, lung didn't come up completely. The thora didn't change her breathing much at all.   ROV 05/04/14 -- hx adenoCA s/p XRT c/b radiation pneumonitis. Underwent L thora 5/'15 without any changes in her breathing. She had a recent small CVA. She underwent CT chest 10/23 > new areas of nodularity, new LUL soft tissue mass, smaller LLL mass, R hilar adenopathy.  She has spoken with Dr Isidore Moos and is planning for a PET scan - she has deferred this to after the holidays. She is on Breo, seems to be tolerating. She has not needed her SABA except twice ever.               ROV 11/29/14 -- follow-up visit for history of COPD. She also follows with Dr. Isidore Moos for adenocarcinoma of the lung status post radiation therapy and complicated some radiation pneumonitis. She underwent PET scan in 06/28/14 that showed recurrent metastatic disease and enlarging hypermetabolic thoracic nodes.   She has decided not to pursue any more chemotherapy or radiation therapy.  She is feeling well - no current clinical manifestations of her cancer.  She is on Breo, believes that it is helpful.                ROV 03/24/15 --  Follow-up visit for patient with a history of adenocarcinoma of the lung as well as COPD. Currently being treated with Breo,   . Her dyspnea may be a bit worse. Some slight increase in cough. Decreased functional capacity.  She started having L hip pain 6 weeks ago, then started bothering her knees. She is planning for repeat PET scan with Dr Isidore Moos, ? Bony involvement that would benefit from palliation.                                               ROV 04/28/15 -- follow-up visit for COPD and history of adenocarcinoma the lung. She underwent a chest x-ray on 9/23 that showed no significant change in her left pleural effusion. 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 She returns today describing some subjective worsening in her breathing especially when she is in pain. She continues to have hip and shoulder pain - PET scan suggests that this is orthopedic. She underwent cortisol shots without improvement.  She is on Breo once a day.                                                                                                                                                         CAT Score 03/24/2015  Total CAT Score 22                                            Review of Systems  Constitutional: Negative for fever and unexpected weight change.  HENT: Positive for rhinorrhea. Negative for congestion, dental problem, ear pain, nosebleeds, postnasal drip, sinus pressure, sneezing, sore throat and trouble swallowing.   Eyes: Negative for redness and itching.  Respiratory: Positive for cough and shortness of breath. Negative for chest tightness and wheezing.   Cardiovascular: Negative for palpitations and leg swelling.  Gastrointestinal: Negative for nausea and vomiting.  Genitourinary: Negative for dysuria.  Musculoskeletal: Negative for joint swelling.  Skin: Negative for rash.  Neurological: Positive for light-headedness. Negative for headaches.  Hematological: Does not bruise/bleed easily.  Psychiatric/Behavioral: Negative for dysphoric mood. The patient is not nervous/anxious.      Filed Vitals:   04/28/15 1530  BP: 118/70  Pulse: 59  Height: '5\' 11"'$  (1.803 m)  Weight: 187 lb 9.6 oz (85.095 kg)  SpO2: 92%    Gen: Pleasant, elderly woman in a wheelchair, in no distress,  normal affect  ENT: No lesions,  mouth clear,  oropharynx clear, no postnasal drip  Neck: No JVD, no TMG, no carotid  bruits  Lungs: No use of accessory muscles, clear without rales or rhonchi, no decreased BS on L, no evidence large pleural effusion.   Cardiovascular: RRR, heart sounds normal, no murmur or gallops, trace peripheral edema  Musculoskeletal: No deformities, no cyanosis or clubbing  Neuro: alert, non focal    12/04/14 --  COMPARISON: PET scan 06/28/2014. Chest radiograph earlier today.  FINDINGS: Mediastinum: No filling defects are noted within the pulmonary arterial tree to suggest pulmonary emboli. Heart size is normal. No pericardial fluid, thickening or calcification. No acute abnormality of the thoracic aorta or other great vessels of the mediastinum. No pathologically enlarged mediastinal or hilar lymph nodes. Previous precarinal node noted on PET scan now decreased in size 9 mm short axis. RIGHT hilar node also appears decreased. The esophagus is normal in appearance. Post XRT changes LEFT medial mediastinum. Moderate atheromatous change of the transverse arch and great vessel origins.  Lungs/Pleura: No consolidative airspace disease. Moderate-sized LEFT pleural effusion is stable. Previous pulmonary nodules observed on prior PET scan dated 06/28/2014 are stable to slightly improved.  Musculoskeletal: No aggressive appearing lytic or blastic lesions are noted in the visualized portions of the skeleton. Advanced osteopenia. Discal calcifications.  Review of the MIP images confirms the above findings.  IMPRESSION: Moderate-sized pleural effusion on the LEFT is stable.  No evidence for pulmonary emboli.  Stable to decreased size of residual metastatic disease in the chest including adenopathy and lung nodules.      Objective:   Physical Exam Filed Vitals:   04/28/15 1530  BP: 118/70  Pulse: 59  Height: '5\' 11"'$  (1.803 m)  Weight: 187 lb 9.6 oz (85.095 kg)  SpO2: 92%       Assessment & Plan:  Pleural effusion, left Her pleural effusion is similar in  size to her previous films based on her current PET scan. I do not believe we need to pursue therapeutic thoracentesis at this time. We will continue to follow the effusion and plan to drain it if we believe it's causing symptoms.   Primary malignant neoplasm of left lower lobe of lung Adenocarcinoma with PET scan in the beginning of October as above. No new interventions have been planned at this time. She is following with Dr. Isidore Moos  COPD (chronic obstructive pulmonary disease) Continue Breo as ordered Walking oximetry and overnight oximetry to see if she qualifies for supplemental O2

## 2015-04-28 NOTE — Assessment & Plan Note (Signed)
Continue Breo as ordered Walking oximetry and overnight oximetry to see if she qualifies for supplemental O2

## 2015-04-28 NOTE — Patient Instructions (Signed)
Please continue Breo daily We will perform walking oximetry today to see if you qualify for supplemental oxygen We will perform an overnight oximetry on room air Follow with Dr Lamonte Sakai in 4 months or sooner if you have any problems.

## 2015-04-28 NOTE — Addendum Note (Signed)
Addended by: Desmond Dike C on: 04/28/2015 04:04 PM   Modules accepted: Orders

## 2015-04-28 NOTE — Assessment & Plan Note (Signed)
Adenocarcinoma with PET scan in the beginning of October as above. No new interventions have been planned at this time. She is following with Dr. Isidore Moos

## 2015-04-28 NOTE — Addendum Note (Signed)
Addended by: Desmond Dike C on: 04/28/2015 03:59 PM   Modules accepted: Orders

## 2015-05-04 ENCOUNTER — Telehealth: Payer: Self-pay | Admitting: Physician Assistant

## 2015-05-04 NOTE — Telephone Encounter (Signed)
Caller name:Tashan   Relationship to patient: Pharmacy   Can be reached: [place]   Reason for call: Pharmacy called in to check the status of a refill request on pt's cyanocobalamin. She says that they have sent it a few times and haven't had a response.

## 2015-05-04 NOTE — Telephone Encounter (Signed)
error 

## 2015-05-04 NOTE — Telephone Encounter (Signed)
I have not received as of yesterday. I am out of office until tomorrow. The last prescription I see for this is from 05/2014 (written by Debbrah Alar) so the refill request may have went to her. I will not refill until I have spoken with patient as I have not prescribed.

## 2015-05-05 MED ORDER — CYANOCOBALAMIN 1000 MCG/ML IJ SOLN: 1000.0000 ug | INTRAMUSCULAR | Status: DC

## 2015-05-05 NOTE — Telephone Encounter (Signed)
Ok to refill for a 2 month supply of her monthly injections. She needs a follow-up with me.

## 2015-05-05 NOTE — Telephone Encounter (Signed)
Refill sent to pharmacy. Notified pt and daughter and scheduled f/u with PA, Martin on 05/10/15 at 2:15pm.

## 2015-05-05 NOTE — Addendum Note (Signed)
Addended by: Kelle Darting A on: 05/05/2015 10:49 AM   Modules accepted: Orders

## 2015-05-05 NOTE — Telephone Encounter (Signed)
Per refill history, medication was filled under PA, Martin on 09/08/13. Refilled under Melissa on 01/04/14? but request was routed to PA, [place] on that date and I am unsure if Lenna Sciara was covering for him or if it was signed under her name in error?  Please advise what needs to be done?

## 2015-05-10 ENCOUNTER — Encounter: Payer: Self-pay | Admitting: Physician Assistant

## 2015-05-10 ENCOUNTER — Ambulatory Visit (INDEPENDENT_AMBULATORY_CARE_PROVIDER_SITE_OTHER): Payer: Medicare Other | Admitting: Physician Assistant

## 2015-05-10 VITALS — BP 118/58 | HR 57 | Temp 97.4°F | Resp 16 | Ht 71.0 in

## 2015-05-10 DIAGNOSIS — I10 Essential (primary) hypertension: Secondary | ICD-10-CM

## 2015-05-10 DIAGNOSIS — Z8673 Personal history of transient ischemic attack (TIA), and cerebral infarction without residual deficits: Secondary | ICD-10-CM

## 2015-05-10 DIAGNOSIS — Z23 Encounter for immunization: Secondary | ICD-10-CM | POA: Diagnosis not present

## 2015-05-10 DIAGNOSIS — E785 Hyperlipidemia, unspecified: Secondary | ICD-10-CM | POA: Diagnosis not present

## 2015-05-10 DIAGNOSIS — R739 Hyperglycemia, unspecified: Secondary | ICD-10-CM | POA: Diagnosis not present

## 2015-05-10 DIAGNOSIS — I639 Cerebral infarction, unspecified: Secondary | ICD-10-CM | POA: Diagnosis not present

## 2015-05-10 DIAGNOSIS — R42 Dizziness and giddiness: Secondary | ICD-10-CM

## 2015-05-10 LAB — POCT GLUCOSE (DEVICE FOR HOME USE): Glucose Fasting, POC: 80 mg/dL (ref 70–99)

## 2015-05-10 NOTE — Patient Instructions (Signed)
I am stopping your Amlodipine, Zetia and Clonidine as your BP is getting lower and causing the lightheadedness.    Please stop by the lab for blood work. I will call you with your results. We will alter regimen further based on symptoms.

## 2015-05-10 NOTE — Progress Notes (Signed)
Patient presents to clinic today for follow-up of chronic medical issues. Patient with history of malignant neoplasm of left lobe, followed by oncology. Patient is currently in remission. Recent PET scan with good results. Patient also with history of CVA, currently on Crestor. Is only taken every other day due to significant myalgias and arthralgias with medication. Patient continues to take her antihypertensive regimen as directed. Has noted some low home blood pressure measurements and lightheadedness upon standing. Denies chest pain, palpitations or shortness of breath.   Past Medical History  Diagnosis Date  . Hypertension   . Colitis   . Coronary artery disease   . High cholesterol     takes Red Yeast Rice and Fish OIl daily  . Kidney stones   . PONV (postoperative nausea and vomiting)   . MI (myocardial infarction) ([place]) 2010  . Shortness of breath     with exertion  . Pneumonia     hx of 20+yrs ago  . COPD (chronic obstructive pulmonary disease) ([facility])     pleurisy or COPD exascerbation > 73yrago  . Headache(784.0)   . Vertigo     HTN related bc gets up too fast  . Arthritis   . Bruises easily   . History of shingles 89yrago    in eye;occasionally gets some residual   . Accessory skin tags     arms/legs  . GERD (gastroesophageal reflux disease)   . Colitis   . Diverticulosis   . Urinary frequency   . Stress incontinence   . History of kidney stones   . Hypothyroidism     takes sYnthroid daily  . Cataract     early stage on right  . Macular degeneration, wet ([facility]  . Depression     some but takes Amitryptylline nightly  . Right-sided chest wall pain 08/28/12    PRESENTATION - RIGHT SIDED ANTERIOR CHEST PAIN  . Weight loss 08/28/12    10 LB WEIGHT LOSS OVER 3 MONTHS  . Lung mass 08/12/12    CHEST-XRAY/ PET -LOBULAR MASS LLL - 4.1 X 3.6 X 4.2 CM  . Dementia     mild  . S/P radiation therapy  10/05/2012-11/20/2012    Left Lower Lung and hilum / 70 Gy in 35  fractions  . Hypertension   . At risk for fall due to comorbid condition     5 falls - last Early December  . Vertigo   . Orthostatic hypotension   . S/P radiation therapy 2/13, 2/20, 2/23 (2015)    SBRT to Right Lung  . Adenocarcinoma of lung ([facility]/10/14    needle core bx-LLL-adenocarcinoma    Current Outpatient Prescriptions on File Prior to Visit  Medication Sig Dispense Refill  . albuterol (PROVENTIL HFA;VENTOLIN HFA) 108 (90 BASE) MCG/ACT inhaler Inhale 2 puffs into the lungs every 6 (six) hours as needed for wheezing. 1 Inhaler 3  . ALPRAZolam (XANAX) 0.25 MG tablet Take 1 tablet (0.25 mg total) by mouth 2 (two) times daily as needed for anxiety. 30 tablet 0  . amitriptyline (ELAVIL) 25 MG tablet Take 1 tablet (25 mg total) by mouth at bedtime. 90 tablet 1  . BIOTIN PO Take 1 tablet by mouth daily.     . carvedilol (COREG) 6.25 MG tablet TAKE 1 TABLET BY MOUTH 2 TIMES A DAY WITH A MEAL 180 tablet 1  . cephALEXin (KEFLEX) 500 MG capsule TAKE 1 CAPSULE (500 MG) BY MOUTH DAILY. MAINTENANCE FOR UTI 90  capsule 0  . clopidogrel (PLAVIX) 75 MG tablet Take 1 tablet (75 mg total) by mouth daily. 90 tablet 1  . cyanocobalamin (,VITAMIN B-12,) 1000 MCG/ML injection Inject 1 mL (1,000 mcg total) into the muscle every 30 (thirty) days. Last dose was on 01-29-14 1 mL 1  . cycloSPORINE (RESTASIS) 0.05 % ophthalmic emulsion Place 1 drop into both eyes 2 (two) times daily. (Patient taking differently: Place 1 drop into both eyes at bedtime. ) 0.4 mL 0  . donepezil (ARICEPT) 10 MG tablet Take 1.5 tablets (15 mg total) by mouth at bedtime. Takes 1 and 1/2 135 tablet 0  . escitalopram (LEXAPRO) 10 MG tablet Take 1 tablet (10 mg total) by mouth daily. 90 tablet 1  . Fluticasone Furoate-Vilanterol (BREO ELLIPTA) 100-25 MCG/INH AEPB Use 1 puff daily as directed. (Patient taking differently: Take 1 puff by mouth daily. ) 60 each 0  . ibuprofen (ADVIL,MOTRIN) 200 MG tablet Take 200 mg by mouth every 6 (six)  hours as needed for moderate pain (pain).    . mesalamine (LIALDA) 1.2 G EC tablet Take 1 tablet (1.2 g total) by mouth 3 (three) times daily. 270 tablet 1  . naproxen sodium (ANAPROX) 220 MG tablet Take 220 mg by mouth daily as needed (pain).     . nitroGLYCERIN (NITROSTAT) 0.4 MG SL tablet Place 1 tablet (0.4 mg total) under the tongue every 5 (five) minutes as needed for chest pain. 30 tablet 1  . ondansetron (ZOFRAN) 8 MG tablet Take 1 tablet (8 mg total) by mouth every 8 (eight) hours as needed for nausea or vomiting. 20 tablet 3  . oxyCODONE (OXY IR/ROXICODONE) 5 MG immediate release tablet Take 1-2 tablets (5-10 mg total) by mouth every 6 (six) hours as needed for severe pain. 100 tablet 0  . pantoprazole (PROTONIX) 40 MG tablet Take 1 tablet (40 mg total) by mouth every morning. 90 tablet 1  . SYNTHROID 125 MCG tablet TAKE 1 TABLET (125 MCG TOTAL) BY MOUTH DAILY BEFORE BREAKFAST. 90 tablet 0   No current facility-administered medications on file prior to visit.    Allergies  Allergen Reactions  . Codeine Hives  . Augmentin [Amoxicillin-Pot Clavulanate] Diarrhea and Nausea And Vomiting  . Penicillins Hives    Family History  Problem Relation Age of Onset  . Anesthesia problems Mother   . Hypotension Neg Hx   . Malignant hyperthermia Neg Hx   . Pseudochol deficiency Neg Hx   . Cancer Maternal Aunt     breast    Social History   Social History  . Marital Status: Widowed    Spouse Name: N/A  . Number of Children: 4  . Years of Education: college   Occupational History  . retired     Press photographer   Social History Main Topics  . Smoking status: Former Smoker -- 1.00 packs/day for 40 years    Types: Cigarettes    Quit date: 08/26/2002  . Smokeless tobacco: Never Used  . Alcohol Use: Yes     Comment: OCC  . Drug Use: No  . Sexual Activity: No   Other Topics Concern  . None   Social History Narrative   Daughter is nurse in neuro ICU   Patient lives at home alone     Patient left handed    Patient drinks coffee daily    Review of Systems - See HPI.  All other ROS are negative.  BP 118/58 mmHg  Pulse 57  Temp(Src) 97.4 F (  36.3 C) (Oral)  Resp 16  Ht '5\' 11"'  (1.803 m)  Wt   SpO2 99%  Physical Exam  Constitutional: She is oriented to person, place, and time and well-developed, well-nourished, and in no distress.  HENT:  Head: Normocephalic and atraumatic.  Right Ear: External ear normal.  Left Ear: External ear normal.  Nose: Nose normal.  Mouth/Throat: Oropharynx is clear and moist. No oropharyngeal exudate.  TM within normal limits bilaterally.  Eyes: Conjunctivae are normal.  Cardiovascular: Normal rate, regular rhythm, normal heart sounds and intact distal pulses.   Pulmonary/Chest: Effort normal and breath sounds normal. No respiratory distress. She has no wheezes. She has no rales. She exhibits no tenderness.  Neurological: She is alert and oriented to person, place, and time.  Skin: Skin is warm and dry. No rash noted.  Psychiatric: Affect normal.  Vitals reviewed.   Recent Results (from the past 2160 hour(s))  Glucose, capillary     Status: None   Collection Time: 04/04/15 11:45 AM  Result Value Ref Range   Glucose-Capillary 94 65 - 99 mg/dL  CBC     Status: Abnormal   Collection Time: 05/10/15  3:46 PM  Result Value Ref Range   WBC 15.8 (H) 4.0 - 10.5 K/uL   RBC 5.16 (H) 3.87 - 5.11 Mil/uL   Platelets 459.0 (H) 150.0 - 400.0 K/uL   Hemoglobin 13.1 12.0 - 15.0 g/dL   HCT 41.2 36.0 - 46.0 %   MCV 79.8 78.0 - 100.0 fl   MCHC 31.9 30.0 - 36.0 g/dL   RDW 16.5 (H) 11.5 - 15.5 %  Comp Met (CMET)     Status: Abnormal   Collection Time: 05/10/15  3:46 PM  Result Value Ref Range   Sodium 135 135 - 145 mEq/L   Potassium 4.6 3.5 - 5.1 mEq/L   Chloride 104 96 - 112 mEq/L   CO2 25 19 - 32 mEq/L   Glucose, Bld 101 (H) 70 - 99 mg/dL   BUN 23 6 - 23 mg/dL   Creatinine, Ser 1.16 0.40 - 1.20 mg/dL   Total Bilirubin 0.4 0.2 - 1.2  mg/dL   Alkaline Phosphatase 109 39 - 117 U/L   AST 12 0 - 37 U/L   ALT 23 0 - 35 U/L   Total Protein 6.8 6.0 - 8.3 g/dL   Albumin 3.6 3.5 - 5.2 g/dL   Calcium 10.0 8.4 - 10.5 mg/dL   GFR 47.48 (L) >60.00 mL/min  Hemoglobin A1c     Status: None   Collection Time: 05/10/15  3:46 PM  Result Value Ref Range   Hgb A1c MFr Bld 5.7 4.6 - 6.5 %    Comment: Glycemic Control Guidelines for People with Diabetes:Non Diabetic:  <6%Goal of Therapy: <7%Additional Action Suggested:  >8%   Lipid panel     Status: Abnormal   Collection Time: 05/10/15  3:46 PM  Result Value Ref Range   Cholesterol 171 0 - 200 mg/dL    Comment: ATP III Classification       Desirable:  < 200 mg/dL               Borderline High:  200 - 239 mg/dL          High:  > = 240 mg/dL   Triglycerides 131.0 0.0 - 149.0 mg/dL    Comment: Normal:  <150 mg/dLBorderline High:  150 - 199 mg/dL   HDL 42.10 >39.00 mg/dL   VLDL 26.2 0.0 - 40.0 mg/dL  LDL Cholesterol 103 (H) 0 - 99 mg/dL   Total CHOL/HDL Ratio 4     Comment:                Men          Women1/2 Average Risk     3.4          3.3Average Risk          5.0          4.42X Average Risk          9.6          7.13X Average Risk          15.0          11.0                       NonHDL 128.86     Comment: NOTE:  Non-HDL goal should be 30 mg/dL higher than patient's LDL goal (i.e. LDL goal of < 70 mg/dL, would have non-HDL goal of < 100 mg/dL)  POCT Glucose (Device for Home Use)     Status: Normal   Collection Time: 05/10/15  4:08 PM  Result Value Ref Range   Glucose Fasting, POC 80 70 - 99 mg/dL   POC Glucose  70 - 99 mg/dl    Assessment/Plan: History of CVA (cerebrovascular accident) Doing very well. No residual effects. Continue Plavix. Is having side effects with Crestor despite only taking QOD. Will repeat lipid panel today and consider switching to a different statin.  HTN (hypertension) BP at lower end of normal. Has noted intermittent lightheadedness and dizziness.  Patient with history of orthostatic hypotension previously. Will discontinue amlodipine and clonidine due to low blood pressure measurements. We'll also discontinue Zetia due to cost and age.  Need for prophylactic vaccination against Streptococcus pneumoniae (pneumococcus) Prevnar given by nursing staff.  Hyperglycemia Noted in prior lab work. Will obtain CMP and A1c today in addition to other lab work.

## 2015-05-11 LAB — COMPREHENSIVE METABOLIC PANEL
ALT: 23 U/L (ref 0–35)
AST: 12 U/L (ref 0–37)
Albumin: 3.6 g/dL (ref 3.5–5.2)
Alkaline Phosphatase: 109 U/L (ref 39–117)
BUN: 23 mg/dL (ref 6–23)
CO2: 25 mEq/L (ref 19–32)
Calcium: 10 mg/dL (ref 8.4–10.5)
Chloride: 104 mEq/L (ref 96–112)
Creatinine, Ser: 1.16 mg/dL (ref 0.40–1.20)
GFR: 47.48 mL/min — AB (ref >60.00)
GLUCOSE: 101 mg/dL — AB (ref 70–99)
Potassium: 4.6 mEq/L (ref 3.5–5.1)
SODIUM: 135 meq/L (ref 135–145)
Total Bilirubin: 0.4 mg/dL (ref 0.2–1.2)
Total Protein: 6.8 g/dL (ref 6.0–8.3)

## 2015-05-11 LAB — CBC
HEMATOCRIT: 41.2 % (ref 36.0–46.0)
HEMOGLOBIN: 13.1 g/dL (ref 12.0–15.0)
MCHC: 31.9 g/dL (ref 30.0–36.0)
MCV: 79.8 fl (ref 78.0–100.0)
Platelets: 459 10*3/uL — ABNORMAL HIGH (ref 150.0–400.0)
RBC: 5.16 Mil/uL — ABNORMAL HIGH (ref 3.87–5.11)
RDW: 16.5 % — AB (ref 11.5–15.5)
WBC: 15.8 10*3/uL — ABNORMAL HIGH (ref 4.0–10.5)

## 2015-05-11 LAB — LIPID PANEL
CHOL/HDL RATIO: 4
Cholesterol: 171 mg/dL (ref 0–200)
HDL: 42.1 mg/dL (ref >39.00)
LDL Cholesterol: 103 mg/dL — ABNORMAL HIGH (ref 0–99)
NonHDL: 128.86
TRIGLYCERIDES: 131 mg/dL (ref 0.0–149.0)
VLDL: 26.2 mg/dL (ref 0.0–40.0)

## 2015-05-11 LAB — HEMOGLOBIN A1C: Hgb A1c MFr Bld: 5.7 % (ref 4.6–6.5)

## 2015-05-12 ENCOUNTER — Telehealth: Payer: Self-pay | Admitting: *Deleted

## 2015-05-12 DIAGNOSIS — D72829 Elevated white blood cell count, unspecified: Secondary | ICD-10-CM

## 2015-05-12 MED ORDER — SIMVASTATIN 10 MG PO TABS: 10.0000 mg | ORAL_TABLET | ORAL | Status: DC

## 2015-05-12 NOTE — Telephone Encounter (Signed)
Patient's daughter [Robin] informed, understood & agreed to forward message; new Rx to pharmacy and lab order placed/SLS SEE Phone note.

## 2015-05-12 NOTE — Telephone Encounter (Signed)
-----   Message from Brunetta Jeans, PA-C sent at 05/12/2015  9:32 AM EST ----- Labs good overall. Cholesterol looks great despite stopping Zetia and taking Crestor every other day. Due to muscle aches it was giving her, thing we can switch to low dose of simvastatin instead to help resolve symptoms while still reducing risk for stoke and heart attack. Let me know if she is willing. Her WBC count is mildly elevated. Want her to return to lab in 1 week for repeat CBC.

## 2015-05-14 NOTE — Assessment & Plan Note (Signed)
Doing very well. No residual effects. Continue Plavix. Is having side effects with Crestor despite only taking QOD. Will repeat lipid panel today and consider switching to a different statin.

## 2015-05-14 NOTE — Assessment & Plan Note (Addendum)
Noted in prior lab work. Will obtain CMP and A1c today in addition to other lab work.

## 2015-05-14 NOTE — Assessment & Plan Note (Signed)
Prevnar given by nursing staff. 

## 2015-05-14 NOTE — Assessment & Plan Note (Signed)
BP at lower end of normal. Has noted intermittent lightheadedness and dizziness. Patient with history of orthostatic hypotension previously. Will discontinue amlodipine and clonidine due to low blood pressure measurements. We'll also discontinue Zetia due to cost and age.

## 2015-05-15 ENCOUNTER — Telehealth: Payer: Self-pay | Admitting: Physician Assistant

## 2015-05-15 ENCOUNTER — Other Ambulatory Visit: Payer: Self-pay | Admitting: Physician Assistant

## 2015-05-15 DIAGNOSIS — F0390 Unspecified dementia without behavioral disturbance: Secondary | ICD-10-CM

## 2015-05-15 MED ORDER — DONEPEZIL HCL 10 MG PO TABS: 15.0000 mg | ORAL_TABLET | Freq: Every day | ORAL | Status: DC

## 2015-05-15 NOTE — Telephone Encounter (Signed)
Caller name:lou Relation to YV:[id number] Call back number:[phone] Pharmacy:[facility]  Reason for call: daughter states that the pharmacy denied her mother's rx donepezil (ARICEPT) 10 MG tablet and informed her to call her doctor, daughter would like to know what is going on with it.

## 2015-05-15 NOTE — Telephone Encounter (Signed)
Pt informed by Orene Desanctis.

## 2015-05-15 NOTE — Telephone Encounter (Signed)
Not sure why denied. I have not denied it. Do not see where refill was sent by me. Refill sent in. Should be no problem getting for Windie now.

## 2015-05-15 NOTE — Telephone Encounter (Signed)
Caller name: [name]  Relationship to patient: daughter  Can be reached:[phone] (home)   [phone] (cell)   Reason for call: Pt's daughter called in because she would like to speak with the assistant in regards to her mothers results. She says that someone spoke with her sister but she would like to know for herself.    Please advise.

## 2015-05-15 NOTE — Telephone Encounter (Signed)
Ms. Jodi Frazier [HIPAA} wanted clarification on patient's elevated WBC result, informed as I had spoken with her sister last week, that this is typical when an infection is present and that her provider wanted to make a lab appointment for repeat CBC in one week to check WBC levels again; caller understood & agreed. Caller also stated that she believed patient to have another UTI and request to p/u supplies to take home and collect sterile urine specimen, agreed and placed at front desk with reminder that urine must be refrigerated and returned within 24 hours of catch, daughter understood & agreed/SLS

## 2015-05-16 ENCOUNTER — Other Ambulatory Visit (INDEPENDENT_AMBULATORY_CARE_PROVIDER_SITE_OTHER): Payer: Medicare Other

## 2015-05-16 DIAGNOSIS — R8290 Unspecified abnormal findings in urine: Secondary | ICD-10-CM | POA: Diagnosis not present

## 2015-05-16 LAB — POCT URINALYSIS DIPSTICK
Bilirubin, UA: NEGATIVE
Blood, UA: NEGATIVE
GLUCOSE UA: NEGATIVE
Ketones, UA: NEGATIVE
LEUKOCYTES UA: NEGATIVE
PROTEIN UA: NEGATIVE
UROBILINOGEN UA: 0.2
pH, UA: 5.5

## 2015-05-16 MED ORDER — CIPROFLOXACIN HCL 250 MG PO TABS: 250.0000 mg | ORAL_TABLET | Freq: Two times a day (BID) | ORAL | Status: DC

## 2015-05-16 NOTE — Addendum Note (Signed)
Addended by: Tasia Catchings on: 05/16/2015 01:43 PM   Modules accepted: Orders

## 2015-05-20 LAB — URINE CULTURE

## 2015-05-22 ENCOUNTER — Other Ambulatory Visit (INDEPENDENT_AMBULATORY_CARE_PROVIDER_SITE_OTHER): Payer: Medicare Other

## 2015-05-22 DIAGNOSIS — N390 Urinary tract infection, site not specified: Secondary | ICD-10-CM

## 2015-05-22 LAB — POCT URINALYSIS DIPSTICK
Bilirubin, UA: NEGATIVE
Glucose, UA: NEGATIVE
KETONES UA: NEGATIVE
LEUKOCYTES UA: NEGATIVE
Nitrite, UA: NEGATIVE
PH UA: 6
PROTEIN UA: NEGATIVE
RBC UA: NEGATIVE
Urobilinogen, UA: 0.2

## 2015-05-23 LAB — URINE CULTURE

## 2015-05-29 ENCOUNTER — Telehealth: Payer: Self-pay | Admitting: Physician Assistant

## 2015-05-29 DIAGNOSIS — N390 Urinary tract infection, site not specified: Secondary | ICD-10-CM

## 2015-05-29 NOTE — Telephone Encounter (Signed)
Pt called to request referral to urology due to UTI issues/bladder infection  Wants to use [facility], Dr. Jeffie Pollock, ph# [phone]

## 2015-05-29 NOTE — Telephone Encounter (Signed)
Referral placed.

## 2015-05-31 ENCOUNTER — Telehealth: Payer: Self-pay | Admitting: Physician Assistant

## 2015-05-31 ENCOUNTER — Emergency Department (HOSPITAL_COMMUNITY)
Admission: EM | Admit: 2015-05-31 | Discharge: 2015-05-31 | Disposition: A | Discharge status: Home / Self Care | Payer: Medicare Other | Attending: Emergency Medicine | Admitting: Emergency Medicine

## 2015-05-31 ENCOUNTER — Telehealth: Payer: Self-pay

## 2015-05-31 ENCOUNTER — Encounter (HOSPITAL_COMMUNITY): Payer: Self-pay

## 2015-05-31 ENCOUNTER — Emergency Department (HOSPITAL_COMMUNITY): Payer: Medicare Other

## 2015-05-31 DIAGNOSIS — E7800 Pure hypercholesterolemia, unspecified: Secondary | ICD-10-CM | POA: Diagnosis not present

## 2015-05-31 DIAGNOSIS — K219 Gastro-esophageal reflux disease without esophagitis: Secondary | ICD-10-CM | POA: Diagnosis not present

## 2015-05-31 DIAGNOSIS — F329 Major depressive disorder, single episode, unspecified: Secondary | ICD-10-CM | POA: Diagnosis not present

## 2015-05-31 DIAGNOSIS — I10 Essential (primary) hypertension: Secondary | ICD-10-CM | POA: Insufficient documentation

## 2015-05-31 DIAGNOSIS — I252 Old myocardial infarction: Secondary | ICD-10-CM | POA: Diagnosis not present

## 2015-05-31 DIAGNOSIS — Z880 Allergy status to penicillin: Secondary | ICD-10-CM | POA: Insufficient documentation

## 2015-05-31 DIAGNOSIS — E039 Hypothyroidism, unspecified: Secondary | ICD-10-CM | POA: Insufficient documentation

## 2015-05-31 DIAGNOSIS — G309 Alzheimer's disease, unspecified: Secondary | ICD-10-CM | POA: Diagnosis not present

## 2015-05-31 DIAGNOSIS — Q828 Other specified congenital malformations of skin: Secondary | ICD-10-CM | POA: Insufficient documentation

## 2015-05-31 DIAGNOSIS — Z87442 Personal history of urinary calculi: Secondary | ICD-10-CM | POA: Diagnosis not present

## 2015-05-31 DIAGNOSIS — Z9181 History of falling: Secondary | ICD-10-CM | POA: Insufficient documentation

## 2015-05-31 DIAGNOSIS — R410 Disorientation, unspecified: Secondary | ICD-10-CM | POA: Diagnosis not present

## 2015-05-31 DIAGNOSIS — F0280 Dementia in other diseases classified elsewhere without behavioral disturbance: Secondary | ICD-10-CM | POA: Diagnosis not present

## 2015-05-31 DIAGNOSIS — Z8619 Personal history of other infectious and parasitic diseases: Secondary | ICD-10-CM | POA: Diagnosis not present

## 2015-05-31 DIAGNOSIS — Z9861 Coronary angioplasty status: Secondary | ICD-10-CM | POA: Insufficient documentation

## 2015-05-31 DIAGNOSIS — J449 Chronic obstructive pulmonary disease, unspecified: Secondary | ICD-10-CM | POA: Insufficient documentation

## 2015-05-31 DIAGNOSIS — Z87891 Personal history of nicotine dependence: Secondary | ICD-10-CM | POA: Insufficient documentation

## 2015-05-31 DIAGNOSIS — M1990 Unspecified osteoarthritis, unspecified site: Secondary | ICD-10-CM | POA: Diagnosis not present

## 2015-05-31 DIAGNOSIS — I2510 Atherosclerotic heart disease of native coronary artery without angina pectoris: Secondary | ICD-10-CM | POA: Insufficient documentation

## 2015-05-31 DIAGNOSIS — Z8701 Personal history of pneumonia (recurrent): Secondary | ICD-10-CM | POA: Diagnosis not present

## 2015-05-31 DIAGNOSIS — Z85118 Personal history of other malignant neoplasm of bronchus and lung: Secondary | ICD-10-CM | POA: Diagnosis not present

## 2015-05-31 DIAGNOSIS — R4182 Altered mental status, unspecified: Secondary | ICD-10-CM | POA: Diagnosis present

## 2015-05-31 LAB — COMPREHENSIVE METABOLIC PANEL
ALK PHOS: 111 U/L (ref 38–126)
ALT: 16 U/L (ref 14–54)
ANION GAP: 9 (ref 5–15)
AST: 17 U/L (ref 15–41)
Albumin: 3.6 g/dL (ref 3.5–5.0)
BILIRUBIN TOTAL: 0.1 mg/dL — AB (ref 0.3–1.2)
BUN: 30 mg/dL — AB (ref 6–20)
CALCIUM: 9.7 mg/dL (ref 8.9–10.3)
CO2: 21 mmol/L — ABNORMAL LOW (ref 22–32)
Chloride: 106 mmol/L (ref 101–111)
Creatinine, Ser: 1.07 mg/dL — ABNORMAL HIGH (ref 0.44–1.00)
GFR calc Af Amer: 54 mL/min — ABNORMAL LOW (ref >60)
GFR, EST NON AFRICAN AMERICAN: 47 mL/min — AB (ref >60)
GLUCOSE: 189 mg/dL — AB (ref 65–99)
POTASSIUM: 3.9 mmol/L (ref 3.5–5.1)
Sodium: 136 mmol/L (ref 135–145)
TOTAL PROTEIN: 7 g/dL (ref 6.5–8.1)

## 2015-05-31 LAB — URINALYSIS, ROUTINE W REFLEX MICROSCOPIC
Bilirubin Urine: NEGATIVE
GLUCOSE, UA: NEGATIVE mg/dL
Hgb urine dipstick: NEGATIVE
Ketones, ur: NEGATIVE mg/dL
LEUKOCYTES UA: NEGATIVE
Nitrite: NEGATIVE
PH: 5.5 (ref 5.0–8.0)
Protein, ur: NEGATIVE mg/dL
Specific Gravity, Urine: 1.025 (ref 1.005–1.030)

## 2015-05-31 LAB — CBC
HEMATOCRIT: 38.7 % (ref 36.0–46.0)
Hemoglobin: 12.2 g/dL (ref 12.0–15.0)
MCH: 25.2 pg — ABNORMAL LOW (ref 26.0–34.0)
MCHC: 31.5 g/dL (ref 30.0–36.0)
MCV: 80 fL (ref 78.0–100.0)
PLATELETS: 416 10*3/uL — AB (ref 150–400)
RBC: 4.84 MIL/uL (ref 3.87–5.11)
RDW: 15.6 % — AB (ref 11.5–15.5)
WBC: 12.5 10*3/uL — AB (ref 4.0–10.5)

## 2015-05-31 LAB — CBG MONITORING, ED: Glucose-Capillary: 181 mg/dL — ABNORMAL HIGH (ref 65–99)
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
## 2015-05-31 NOTE — Telephone Encounter (Signed)
[facility] - Client [facility] Patient Name: Jodi Frazier DOB: 12-07-1930 Initial Comment Caller states mom disoriented and has elevated bp, has had a stoke before Nurse Assessment Nurse: Mechele Dawley, RN, Amy Date/Time (Eastern Time): 05/31/2015 9:33:02 AM Confirm and document reason for call. If symptomatic, describe symptoms. ---UTI AND THEY CHANGED HER PRESCRIPTIONS. SINCE THANKSGIVING SHE HAS BEEN DISORIENTED SHE CAN NOT GET HER WORDS OUT. SHE IS SHAKY. BP HAS BEEN ELEVATED. STROKE BEFORE IN 2015. 2 WEEKS AGO SHE WAS SEEN IN THE OFFICE. Has the patient traveled out of the country within the last 30 days? ---Not Applicable Does the patient have any new or worsening symptoms? ---Yes Will a triage be completed? ---Yes Related visit to physician within the last 2 weeks? ---Yes Does the PT have any chronic conditions? (i.e. diabetes, asthma, etc.) ---Yes List chronic conditions. ---LUNG CA, COPD, UTI, Is this a behavioral health call? ---No Guidelines Guideline Title Affirmed Question Affirmed Notes Confusion - Delirium Patient sounds very sick or weak to the triager Final Disposition User Go to ED Now (or PCP triage) [place], RN, Amy Comments HIP PAIN, UTI, BP ELEVATED, CONFUSION - CAN'T GET WORDS OUT. SHE IS SHAKY. OVERALL NOT FEELING WELL. DAUGHTER DOES NOT FEEL THAT SHE IS ACTING RIGHT. FEELS IT IS MORE THAN THE MEDICATION CHANGES. Referrals GO TO FACILITY OTHER - SPECIFY Disagree/Comply: Comply

## 2015-05-31 NOTE — Telephone Encounter (Signed)
Pts daughter called in stating pt is disoriented and BP is elevated. Transferred call to [facility].

## 2015-05-31 NOTE — ED Notes (Signed)
Pt c/o confusion and intermittent expressive aphasia x 5 days.  Denies pain.  Pt and Pt's daughter report several recent medication changes and recent UTI.  Hx of TIA x 1 year ago.  NAD noted.  Pt was able to carry on a complete conversation w/o difficulty.

## 2015-05-31 NOTE — Telephone Encounter (Signed)
Follow up call made to patient. States she did go to ED. Diagnosed with Confusion. Follow up appointment scheduled. Patient states she went to bathroom since being home, was a little dizzy on way back but better now. Advised patient to go back to ED if condition worsens before appointment.

## 2015-05-31 NOTE — ED Notes (Signed)
Pt's daughter called saying that pt was coming in.  Has been experiencing disorientation and shakiness after being taken off a blood pressure medicine.  Was taken off this medicine for hypotensive episodes.  Now experiencing hypertension.

## 2015-05-31 NOTE — Telephone Encounter (Signed)
Message routed to Vernie Shanks, LPN.

## 2015-05-31 NOTE — Discharge Instructions (Signed)
We did not find a cause of your confusion and disorientation today. You looked well today, but if you have recurrent confusion, difficulty speaking, difficulty walking, or any other symptoms concerning to you, please return to ED.  Confusion Confusion is the inability to think with your usual speed or clarity. Confusion may come on quickly or slowly over time. How quickly the confusion comes on depends on the cause. Confusion can be due to any number of causes. CAUSES   Concussion, head injury, or head trauma.  Seizures.  Stroke.  Fever.  Brain tumor.  Age related decreased brain function (dementia).  Heightened emotional states like rage or terror.  Mental illness in which the person loses the ability to determine what is real and what is not (hallucinations).  Infections such as a urinary tract infection (UTI).  Toxic effects from alcohol, drugs, or prescription medicines.  Dehydration and an imbalance of salts in the body (electrolytes).  Lack of sleep.  Low blood sugar (diabetes).  Low levels of oxygen from conditions such as chronic lung disorders.  Drug interactions or other medicine side effects.  Nutritional deficiencies, especially niacin, thiamine, vitamin C, or vitamin B.  Sudden drop in body temperature (hypothermia).  Change in routine, such as when traveling or hospitalized. SIGNS AND SYMPTOMS  People often describe their thinking as cloudy or unclear when they are confused. Confusion can also include feeling disoriented. That means you are unaware of where or who you are. You may also not know what the date or time is. If confused, you may also have difficulty paying attention, remembering, and making decisions. Some people also act aggressively when they are confused.  DIAGNOSIS  The medical evaluation of confusion may include:  Blood and urine tests.  X-rays.  Brain and nervous system tests.  Analyzing your brain waves (electroencephalogram or  EEG).  Magnetic resonance imaging (MRI) of your head.  Computed tomography (CT) scan of your head.  Mental status tests in which your health care provider may ask many questions. Some of these questions may seem silly or strange, but they are a very important test to help diagnose and treat confusion. TREATMENT  An admission to the hospital may not be needed, but a person with confusion should not be left alone. Stay with a family member or friend until the confusion clears. Avoid alcohol, pain relievers, or sedative drugs until you have fully recovered. Do not drive until directed by your health care provider. HOME CARE INSTRUCTIONS  What family and friends can do:  To find out if someone is confused, ask the person to state his or her name, age, and the date. If the person is unsure or answers incorrectly, he or she is confused.  Always introduce yourself, no matter how well the person knows you.  Often remind the person of his or her location.  Place a calendar and clock near the confused person.  Help the person with his or her medicines. You may want to use a pill box, an alarm as a reminder, or give the person each dose as prescribed.  Talk about current events and plans for the day.  Try to keep the environment calm, quiet, and peaceful.  Make sure the person keeps follow-up visits with his or her health care provider. PREVENTION  Ways to prevent confusion:  Avoid alcohol.  Eat a balanced diet.  Get enough sleep.  Take medicine only as directed by your health care provider.  Do not become isolated. Spend time  with other people and make plans for your days.  Keep careful watch on your blood sugar levels if you are diabetic. SEEK IMMEDIATE MEDICAL CARE IF:   You develop severe headaches, repeated vomiting, seizures, blackouts, or slurred speech.  There is increasing confusion, weakness, numbness, restlessness, or personality changes.  You develop a loss of balance,  have marked dizziness, feel uncoordinated, or fall.  You have delusions, hallucinations, or develop severe anxiety.  Your family members think you need to be rechecked.   This information is not intended to replace advice given to you by your health care provider. Make sure you discuss any questions you have with your health care provider.   Document Released: 07/25/2004 Document Revised: 07/08/2014 Document Reviewed: 07/23/2013 Elsevier Interactive Patient Education [employer].

## 2015-05-31 NOTE — Telephone Encounter (Signed)
Follow up call made to patient,no answer.

## 2015-05-31 NOTE — ED Notes (Signed)
Pt ambulated with walker prior to discharge. MD witnessed pt walking with walker and with steady gait. No other c/c. Has appointment with PCP -advised to have MD re-check HR and rhythm.

## 2015-05-31 NOTE — ED Notes (Signed)
INITIAL ASSESSMENT COMPLETED. PT C/O PERIODS OF CONFUSION AND FEELING "SHAKY" X2-3 DAYS. PT STATES SHE HAS A HX OF TIA. DENIES ANY PAIN AT THIS TIME. AWAITING FURTHER ORDERS.

## 2015-06-06 ENCOUNTER — Encounter: Payer: Self-pay | Admitting: Emergency Medicine

## 2015-06-09 ENCOUNTER — Ambulatory Visit (INDEPENDENT_AMBULATORY_CARE_PROVIDER_SITE_OTHER): Payer: Medicare Other | Admitting: Physician Assistant

## 2015-06-09 ENCOUNTER — Encounter: Payer: Self-pay | Admitting: Physician Assistant

## 2015-06-09 VITALS — BP 137/70 | HR 55 | Temp 98.0°F | Wt 187.0 lb

## 2015-06-09 DIAGNOSIS — I639 Cerebral infarction, unspecified: Secondary | ICD-10-CM

## 2015-06-09 DIAGNOSIS — R8290 Unspecified abnormal findings in urine: Secondary | ICD-10-CM

## 2015-06-09 DIAGNOSIS — R413 Other amnesia: Secondary | ICD-10-CM

## 2015-06-09 LAB — POCT URINALYSIS DIPSTICK
BILIRUBIN UA: NEGATIVE
GLUCOSE UA: NEGATIVE
KETONES UA: NEGATIVE
NITRITE UA: NEGATIVE
Spec Grav, UA: 1.02
Urobilinogen, UA: 0.2
pH, UA: 5

## 2015-06-09 MED ORDER — CELECOXIB 100 MG PO CAPS: 100.0000 mg | ORAL_CAPSULE | Freq: Two times a day (BID) | ORAL | Status: DC

## 2015-06-09 MED ORDER — LEVOTHYROXINE SODIUM 125 MCG PO TABS: ORAL_TABLET | ORAL | Status: AC

## 2015-06-09 MED ORDER — AMITRIPTYLINE HCL 25 MG PO TABS: 25.0000 mg | ORAL_TABLET | Freq: Every day | ORAL | Status: AC

## 2015-06-09 NOTE — Progress Notes (Signed)
Patient presents to clinic today for ER follow-up of confusion. Presented to ER on 05/31/2015. Workup including EKG, CXR, CT Head and UA negative for acute changes or infection. Patient stabilized and discharged with PCP follow-up. Was thought that her injections in her hip were contributing to her symptoms. Also felt sun-downing was a potential but less likely option. Patient denies recurrence of symptoms since discharge. Has been doing very well overall. Has noted mild suprapubic pressure over the past day. Denies urinary urgency, frequency, hematuria or dysuria.   Past Medical History  Diagnosis Date  . Hypertension   . Colitis   . Coronary artery disease   . High cholesterol     takes Red Yeast Rice and Fish OIl daily  . Kidney stones   . PONV (postoperative nausea and vomiting)   . MI (myocardial infarction) ([place]) 2010  . Shortness of breath     with exertion  . Pneumonia     hx of 20+yrs ago  . COPD (chronic obstructive pulmonary disease) (HCC)     pleurisy or COPD exascerbation > 57yrago  . Headache(784.0)   . Vertigo     HTN related bc gets up too fast  . Arthritis   . Bruises easily   . History of shingles 149yrago    in eye;occasionally gets some residual   . Accessory skin tags     arms/legs  . GERD (gastroesophageal reflux disease)   . Colitis   . Diverticulosis   . Urinary frequency   . Stress incontinence   . History of kidney stones   . Hypothyroidism     takes sYnthroid daily  . Cataract     early stage on right  . [place]  . Depression     some but takes Amitryptylline nightly  . Right-sided chest wall pain 08/28/12    PRESENTATION - RIGHT SIDED ANTERIOR CHEST PAIN  . Weight loss 08/28/12    10 LB WEIGHT LOSS OVER 3 MONTHS  . Lung mass 08/12/12    CHEST-XRAY/ PET -LOBULAR MASS LLL - 4.1 X 3.6 X 4.2 CM  . Dementia     mild  . S/P radiation therapy  10/05/2012-11/20/2012    Left Lower Lung and hilum / 70 Gy in 35 fractions  .  Hypertension   . At risk for fall due to comorbid condition     5 falls - last Early December  . Vertigo   . Orthostatic hypotension   . S/P radiation therapy 2/13, 2/20, 2/23 (2015)    SBRT to Right Lung  . [place]    needle core bx-LLL-adenocarcinoma    Current Outpatient Prescriptions on File Prior to Visit  Medication Sig Dispense Refill  . albuterol (PROVENTIL HFA;VENTOLIN HFA) 108 (90 BASE) MCG/ACT inhaler Inhale 2 puffs into the lungs every 6 (six) hours as needed for wheezing. 1 Inhaler 3  . ALPRAZolam (XANAX) 0.25 MG tablet Take 1 tablet (0.25 mg total) by mouth 2 (two) times daily as needed for anxiety. 30 tablet 0  . BIOTIN PO Take 1 tablet by mouth daily.     . carvedilol (COREG) 6.25 MG tablet TAKE 1 TABLET BY MOUTH 2 TIMES A DAY WITH A MEAL 180 tablet 1  . ciprofloxacin (CIPRO) 250 MG tablet Take 1 tablet (250 mg total) by mouth 2 (two) times daily. 6 tablet 0  . clopidogrel (PLAVIX) 75 MG tablet Take 1 tablet (75 mg total) by mouth daily.  90 tablet 1  . cyanocobalamin (,VITAMIN B-12,) 1000 MCG/ML injection Inject 1 mL (1,000 mcg total) into the muscle every 30 (thirty) days. Last dose was on 01-29-14 1 mL 1  . cycloSPORINE (RESTASIS) 0.05 % ophthalmic emulsion Place 1 drop into both eyes 2 (two) times daily. (Patient taking differently: Place 1 drop into both eyes at bedtime. ) 0.4 mL 0  . donepezil (ARICEPT) 10 MG tablet Take 1.5 tablets (15 mg total) by mouth at bedtime. Takes 1 and 1/2 135 tablet 3  . escitalopram (LEXAPRO) 10 MG tablet TAKE 1 TABLET BY MOUTH DAILY 90 tablet 1  . Fluticasone Furoate-Vilanterol (BREO ELLIPTA) 100-25 MCG/INH AEPB Use 1 puff daily as directed. (Patient taking differently: Take 1 puff by mouth daily. ) 60 each 0  . ibuprofen (ADVIL,MOTRIN) 200 MG tablet Take 200 mg by mouth every 6 (six) hours as needed for moderate pain (pain).    . mesalamine (LIALDA) 1.2 G EC tablet Take 1 tablet (1.2 g total) by mouth 3 (three)  times daily. 270 tablet 1  . naproxen sodium (ANAPROX) 220 MG tablet Take 220 mg by mouth daily as needed (pain).     . nitroGLYCERIN (NITROSTAT) 0.4 MG SL tablet Place 1 tablet (0.4 mg total) under the tongue every 5 (five) minutes as needed for chest pain. 30 tablet 1  . ondansetron (ZOFRAN) 8 MG tablet Take 1 tablet (8 mg total) by mouth every 8 (eight) hours as needed for nausea or vomiting. 20 tablet 3  . oxyCODONE (OXY IR/ROXICODONE) 5 MG immediate release tablet Take 1-2 tablets (5-10 mg total) by mouth every 6 (six) hours as needed for severe pain. 100 tablet 0  . pantoprazole (PROTONIX) 40 MG tablet Take 1 tablet (40 mg total) by mouth every morning. 90 tablet 1  . simvastatin (ZOCOR) 10 MG tablet Take 1 tablet (10 mg total) by mouth every other day. 45 tablet 0   No current facility-administered medications on file prior to visit.    Allergies  Allergen Reactions  . Codeine Hives  . Augmentin [Amoxicillin-Pot Clavulanate] Diarrhea and Nausea And Vomiting  . Penicillins Hives    Has patient had a PCN reaction causing immediate rash, facial/tongue/throat swelling, SOB or lightheadedness with hypotension: No Has patient had a PCN reaction causing severe rash involving mucus membranes or skin necrosis: Hives  Has patient had a PCN reaction that required hospitalization: No Has patient had a PCN reaction occurring within the last 10 years: No     Family History  Problem Relation Age of Onset  . Anesthesia problems Mother   . Hypotension Neg Hx   . Malignant hyperthermia Neg Hx   . Pseudochol deficiency Neg Hx   . Cancer Maternal Aunt     breast    Social History   Social History  . Marital Status: Widowed    Spouse Name: N/A  . Number of Children: 4  . Years of Education: college   Occupational History  . retired     Press photographer   Social History Main Topics  . Smoking status: Former Smoker -- 1.00 packs/day for 40 years    Types: Cigarettes    Quit date: 08/26/2002   . Smokeless tobacco: Never Used  . Alcohol Use: Yes     Comment: OCC  . Drug Use: No  . Sexual Activity: No   Other Topics Concern  . None   Social History Narrative   Daughter is nurse in neuro ICU   Patient lives at  home alone   Patient left handed    Patient drinks coffee daily   Review of Systems - See HPI.  All other ROS are negative.  BP 137/70 mmHg  Pulse 55  Temp(Src) 98 F (36.7 C)  Wt 187 lb (84.823 kg)  SpO2 99%  Physical Exam  Constitutional: She is oriented to person, place, and time and well-developed, well-nourished, and in no distress.  HENT:  Head: Normocephalic and atraumatic.  Cardiovascular: Normal rate, regular rhythm, normal heart sounds and intact distal pulses.   Pulmonary/Chest: Effort normal and breath sounds normal. No respiratory distress. She has no wheezes. She has no rales. She exhibits no tenderness.  Neurological: She is alert and oriented to person, place, and time.  Skin: Skin is warm and dry. No rash noted.  Psychiatric: Affect normal.  Vitals reviewed.   Recent Results (from the past 2160 hour(s))  Glucose, capillary     Status: None   Collection Time: 04/04/15 11:45 AM  Result Value Ref Range   Glucose-Capillary 94 65 - 99 mg/dL  CBC     Status: Abnormal   Collection Time: 05/10/15  3:46 PM  Result Value Ref Range   WBC 15.8 (H) 4.0 - 10.5 K/uL   RBC 5.16 (H) 3.87 - 5.11 Mil/uL   Platelets 459.0 (H) 150.0 - 400.0 K/uL   Hemoglobin 13.1 12.0 - 15.0 g/dL   HCT 41.2 36.0 - 46.0 %   MCV 79.8 78.0 - 100.0 fl   MCHC 31.9 30.0 - 36.0 g/dL   RDW 16.5 (H) 11.5 - 15.5 %  Comp Met (CMET)     Status: Abnormal   Collection Time: 05/10/15  3:46 PM  Result Value Ref Range   Sodium 135 135 - 145 mEq/L   Potassium 4.6 3.5 - 5.1 mEq/L   Chloride 104 96 - 112 mEq/L   CO2 25 19 - 32 mEq/L   Glucose, Bld 101 (H) 70 - 99 mg/dL   BUN 23 6 - 23 mg/dL   Creatinine, Ser 1.16 0.40 - 1.20 mg/dL   Total Bilirubin 0.4 0.2 - 1.2 mg/dL   Alkaline  Phosphatase 109 39 - 117 U/L   AST 12 0 - 37 U/L   ALT 23 0 - 35 U/L   Total Protein 6.8 6.0 - 8.3 g/dL   Albumin 3.6 3.5 - 5.2 g/dL   Calcium 10.0 8.4 - 10.5 mg/dL   GFR 47.48 (L) >60.00 mL/min  Hemoglobin A1c     Status: None   Collection Time: 05/10/15  3:46 PM  Result Value Ref Range   Hgb A1c MFr Bld 5.7 4.6 - 6.5 %    Comment: Glycemic Control Guidelines for People with Diabetes:Non Diabetic:  <6%Goal of Therapy: <7%Additional Action Suggested:  >8%   Lipid panel     Status: Abnormal   Collection Time: 05/10/15  3:46 PM  Result Value Ref Range   Cholesterol 171 0 - 200 mg/dL    Comment: ATP III Classification       Desirable:  < 200 mg/dL               Borderline High:  200 - 239 mg/dL          High:  > = 240 mg/dL   Triglycerides 131.0 0.0 - 149.0 mg/dL    Comment: Normal:  <150 mg/dLBorderline High:  150 - 199 mg/dL   HDL 42.10 >39.00 mg/dL   VLDL 26.2 0.0 - 40.0 mg/dL   LDL Cholesterol 103 (H)  0 - 99 mg/dL   Total CHOL/HDL Ratio 4     Comment:                Men          Women1/2 Average Risk     3.4          3.3Average Risk          5.0          4.42X Average Risk          9.6          7.13X Average Risk          15.0          11.0                       NonHDL 128.86     Comment: NOTE:  Non-HDL goal should be 30 mg/dL higher than patient's LDL goal (i.e. LDL goal of < 70 mg/dL, would have non-HDL goal of < 100 mg/dL)  POCT Glucose (Device for Home Use)     Status: Normal   Collection Time: 05/10/15  4:08 PM  Result Value Ref Range   Glucose Fasting, POC 80 70 - 99 mg/dL   POC Glucose  70 - 99 mg/dl  POCT urinalysis dipstick     Status: None   Collection Time: 05/16/15 11:08 AM  Result Value Ref Range   Color, UA yellow    Clarity, UA clear    Glucose, UA neg    Bilirubin, UA neg    Ketones, UA neg    Spec Grav, UA >=1.030    Blood, UA neg    pH, UA 5.5    Protein, UA neg    Urobilinogen, UA 0.2    Nitrite, UA trace    Leukocytes, UA Negative Negative  Urine  culture     Status: None   Collection Time: 05/16/15 11:08 AM  Result Value Ref Range   Culture ENTEROBACTER CLOACAE    Colony Count >=100,000 COLONIES/ML    Organism ID, Bacteria ENTEROBACTER CLOACAE     Comment: This organism demonstrates carbapenem resistance, which implies a carbapenemase gene. Treatment with,Beta lactams and/or Cephems that report as, susceptible may not be clinically effective.       Susceptibility   Enterobacter cloacae -  (no method available)    AMOX/CLAVULANIC >=32 Resistant     PIP/TAZO >=128 Resistant     IMIPENEM  Resistant     CEFAZOLIN >=64 Resistant     CEFTRIAXONE >=64 Resistant     CEFTAZIDIME >=64 Resistant     CEFEPIME  Resistant     GENTAMICIN <=1 Sensitive     TOBRAMYCIN <=1 Sensitive     CIPROFLOXACIN <=0.25 Sensitive     LEVOFLOXACIN <=0.12 Sensitive     NITROFURANTOIN <=16 Sensitive     TRIMETH/SULFA* <=20 Sensitive      * NR=NOT REPORTABLE,SEE COMMENTORAL therapy:A cefazolin MIC of <32 predicts susceptibility to the oral agents cefaclor,cefdinir,cefpodoxime,cefprozil,cefuroxime,cephalexin,and loracarbef when used for therapy of uncomplicated UTIs due to E.coli,K.pneumomiae,and P.mirabilis. PARENTERAL therapy: A cefazolinMIC of >8 indicates resistance to parenteralcefazolin. An alternate test method must beperformed to confirm susceptibility to parenteralcefazolin.  POCT urinalysis dipstick     Status: None   Collection Time: 05/22/15 11:40 AM  Result Value Ref Range   Color, UA yellow    Clarity, UA clear    Glucose, UA neg    Bilirubin, UA neg    Ketones,  UA neg    Spec Grav, UA >=1.030    Blood, UA neg    pH, UA 6.0    Protein, UA neg    Urobilinogen, UA 0.2    Nitrite, UA neg    Leukocytes, UA Negative Negative  Urine Culture     Status: None   Collection Time: 05/22/15 11:56 AM  Result Value Ref Range   Colony Count 4,000 COLONIES/ML    Organism ID, Bacteria Insignificant Growth   CBG monitoring, ED     Status: Abnormal    Collection Time: 05/31/15 11:44 AM  Result Value Ref Range   Glucose-Capillary 181 (H) 65 - 99 mg/dL  Comprehensive metabolic panel     Status: Abnormal   Collection Time: 05/31/15 11:51 AM  Result Value Ref Range   Sodium 136 135 - 145 mmol/L   Potassium 3.9 3.5 - 5.1 mmol/L   Chloride 106 101 - 111 mmol/L   CO2 21 (L) 22 - 32 mmol/L   Glucose, Bld 189 (H) 65 - 99 mg/dL   BUN 30 (H) 6 - 20 mg/dL   Creatinine, Ser 1.07 (H) 0.44 - 1.00 mg/dL   Calcium 9.7 8.9 - 10.3 mg/dL   Total Protein 7.0 6.5 - 8.1 g/dL   Albumin 3.6 3.5 - 5.0 g/dL   AST 17 15 - 41 U/L   ALT 16 14 - 54 U/L   Alkaline Phosphatase 111 38 - 126 U/L   Total Bilirubin 0.1 (L) 0.3 - 1.2 mg/dL   GFR calc non Af Amer 47 (L) >60 mL/min   GFR calc Af Amer 54 (L) >60 mL/min    Comment: (NOTE) The eGFR has been calculated using the CKD EPI equation. This calculation has not been validated in all clinical situations. eGFR's persistently <60 mL/min signify possible Chronic Kidney Disease.    Anion gap 9 5 - 15  CBC     Status: Abnormal   Collection Time: 05/31/15 11:51 AM  Result Value Ref Range   WBC 12.5 (H) 4.0 - 10.5 K/uL   RBC 4.84 3.87 - 5.11 MIL/uL   Hemoglobin 12.2 12.0 - 15.0 g/dL   HCT 38.7 36.0 - 46.0 %   MCV 80.0 78.0 - 100.0 fL   MCH 25.2 (L) 26.0 - 34.0 pg   MCHC 31.5 30.0 - 36.0 g/dL   RDW 15.6 (H) 11.5 - 15.5 %   Platelets 416 (H) 150 - 400 K/uL  Urinalysis, Routine w reflex microscopic (not at [facility])     Status: Abnormal   Collection Time: 05/31/15  1:15 PM  Result Value Ref Range   Color, Urine YELLOW YELLOW   APPearance CLOUDY (A) CLEAR   Specific Gravity, Urine 1.025 1.005 - 1.030   pH 5.5 5.0 - 8.0   Glucose, UA NEGATIVE NEGATIVE mg/dL   Hgb urine dipstick NEGATIVE NEGATIVE   Bilirubin Urine NEGATIVE NEGATIVE   Ketones, ur NEGATIVE NEGATIVE mg/dL   Protein, ur NEGATIVE NEGATIVE mg/dL   Nitrite NEGATIVE NEGATIVE   Leukocytes, UA NEGATIVE NEGATIVE    Comment: MICROSCOPIC NOT DONE ON URINES  WITH NEGATIVE PROTEIN, BLOOD, LEUKOCYTES, NITRITE, OR GLUCOSE <1000 mg/dL.  POCT Urinalysis Dipstick     Status: Abnormal   Collection Time: 06/09/15  3:25 PM  Result Value Ref Range   Color, UA yellow    Clarity, UA clear    Glucose, UA neg    Bilirubin, UA neg    Ketones, UA neg    Spec Grav, UA 1.020  Blood, UA small    pH, UA 5.0    Protein, UA trace    Urobilinogen, UA 0.2    Nitrite, UA neg    Leukocytes, UA large (3+) (A) Negative  Urine Culture     Status: None   Collection Time: 06/09/15  3:26 PM  Result Value Ref Range   Culture ESCHERICHIA COLI    Colony Count >=100,000 COLONIES/ML    Organism ID, Bacteria ESCHERICHIA COLI       Susceptibility   Escherichia coli -  (no method available)    AMPICILLIN >=32 Resistant     AMOX/CLAVULANIC <=2 Sensitive     AMPICILLIN/SULBACTAM 4 Sensitive     PIP/TAZO <=4 Sensitive     IMIPENEM <=0.25 Sensitive     CEFAZOLIN <=4 Not Reportable     CEFTRIAXONE <=1 Sensitive     CEFTAZIDIME <=1 Sensitive     CEFEPIME <=1 Sensitive     GENTAMICIN 4 Sensitive     TOBRAMYCIN 2 Sensitive     CIPROFLOXACIN >=4 Resistant     LEVOFLOXACIN >=8 Resistant     NITROFURANTOIN <=16 Sensitive     TRIMETH/SULFA* <=20 Sensitive      * NR=NOT REPORTABLE,SEE COMMENTORAL therapy:A cefazolin MIC of <32 predicts susceptibility to the oral agents cefaclor,cefdinir,cefpodoxime,cefprozil,cefuroxime,cephalexin,and loracarbef when used for therapy of uncomplicated UTIs due to E.coli,K.pneumomiae,and P.mirabilis. PARENTERAL therapy: A cefazolinMIC of >8 indicates resistance to parenteralcefazolin. An alternate test method must beperformed to confirm susceptibility to parenteralcefazolin.    Assessment/Plan: Acute memory impairment Reason for ER visit. Workup unremarkable. Symptoms were present around the time of her injection. No recurrence of symptoms. Will continue to follow. Will check her urine today due to history of infection. Will treat based on  results.

## 2015-06-09 NOTE — Progress Notes (Signed)
Pre visit review using our clinic review tool, if applicable. No additional management support is needed unless otherwise documented below in the visit note. 

## 2015-06-09 NOTE — Patient Instructions (Signed)
Please go downstairs to pick up medications. Try the Celebrex to see how it works for you. Take with food. Increase fluids and start a cranberry supplement. I will call you with your culture results.  Set up your MyChart today so we can communicate this weekend.

## 2015-06-13 ENCOUNTER — Telehealth: Payer: Self-pay | Admitting: *Deleted

## 2015-06-13 DIAGNOSIS — N390 Urinary tract infection, site not specified: Secondary | ICD-10-CM

## 2015-06-13 LAB — URINE CULTURE

## 2015-06-13 NOTE — Telephone Encounter (Signed)
-----   Message from Brunetta Jeans, PA-C sent at 06/13/2015 10:25 AM EST ----- Sensitivities show UTI sensitive to antibiotic given. Take as directed. Follow-up next week for repeat urine testing.

## 2015-06-13 NOTE — Telephone Encounter (Addendum)
Called and spoke with the pt and informed her for recent culture results and note.  Pt verbalized understanding and agreed.  Future labs ordered and sent.//AB/CMA

## 2015-06-19 NOTE — Assessment & Plan Note (Signed)
Reason for ER visit. Workup unremarkable. Symptoms were present around the time of her injection. No recurrence of symptoms. Will continue to follow. Will check her urine today due to history of infection. Will treat based on results.

## 2015-06-23 ENCOUNTER — Telehealth: Payer: Self-pay | Admitting: *Deleted

## 2015-06-23 ENCOUNTER — Telehealth: Payer: Self-pay | Admitting: Emergency Medicine

## 2015-06-23 NOTE — Telephone Encounter (Signed)
Received fax from [place] that patient has Surgery scheduled on August 08, 2015 for Left Hip w/wo autograft/allograft and [facility] is requesting Medical/Surgical Clearance appointment prior; forwarded to scheduler/SLS 12/23  Please call patient and schedule 30-Minute Appointment for Medical/Surgical Clearance prior to 08/08/15 Sx date with PCP/SLS Thanks.

## 2015-06-23 NOTE — Telephone Encounter (Signed)
Spoke with pt's daughter. States that the pt needs a surgical clearance appointment. OV has been scheduled for 07/11/15 at 4:30pm. Nothing further was needed.

## 2015-06-27 ENCOUNTER — Other Ambulatory Visit: Payer: Self-pay | Admitting: Physician Assistant

## 2015-06-27 NOTE — Telephone Encounter (Signed)
Requesting Alprazolam 0.'25mg'$ -Take 1 tablet by mouth twice daily as needed for anxiety. Last refill:11/01/14;#30,0 Last OV:06/09/15 Please advise.//AB/CMA

## 2015-06-27 NOTE — Telephone Encounter (Addendum)
Rx printed and faxed to the pharmacy.  Confirmation received.  Pt aware.//AB/CMA

## 2015-06-27 NOTE — Telephone Encounter (Signed)
Paperwork forwarded to PCP/SLS 12/27

## 2015-06-30 ENCOUNTER — Telehealth: Payer: Self-pay | Admitting: Emergency Medicine

## 2015-06-30 MED ORDER — ALBUTEROL SULFATE HFA 108 (90 BASE) MCG/ACT IN AERS: 2.0000 | INHALATION_SPRAY | Freq: Four times a day (QID) | RESPIRATORY_TRACT | Status: DC | PRN

## 2015-06-30 NOTE — Telephone Encounter (Signed)
Called spoke with pt daughter. She is requesting albuterol inhaler #2 inhalers. I have sent this in. Nothing further needed

## 2015-07-05 ENCOUNTER — Other Ambulatory Visit: Payer: Self-pay | Admitting: Physician Assistant

## 2015-07-05 MED ORDER — ALBUTEROL SULFATE HFA 108 (90 BASE) MCG/ACT IN AERS: 2.0000 | INHALATION_SPRAY | Freq: Four times a day (QID) | RESPIRATORY_TRACT | Status: AC | PRN

## 2015-07-06 MED FILL — BREO ELLIPTA 100-25 MCG INH: 100-25 | 30 days supply | Qty: 60 | Fill #0

## 2015-07-10 NOTE — Telephone Encounter (Signed)
Pt has surgical clearance sch with Dr. Lamonte Sakai at [facility] 07/11/15. Does she need one here too?

## 2015-07-10 NOTE — Telephone Encounter (Signed)
Again,  Rockwell Germany, CMA at 06/27/2015 9:16 AM     Status: Signed       Paperwork forwarded to PCP/SLS 12/27       Rockwell Germany, CMA at 06/23/2015 2:55 PM     Status: Signed       Received fax from [facility] informing that patient has Surgery scheduled on August 08, 2015 for Left Hip w/wo autograft/allograft and [facility] is requesting Medical/Surgical Clearance appointment prior; forwarded to scheduler/SLS 12/23   Please call patient and schedule 30-Minute Appointment for Medical/Surgical Clearance prior to 08/08/15 Sx date with PCP/SLS Thanks.

## 2015-07-11 ENCOUNTER — Encounter: Payer: Self-pay | Admitting: Emergency Medicine

## 2015-07-11 ENCOUNTER — Ambulatory Visit (INDEPENDENT_AMBULATORY_CARE_PROVIDER_SITE_OTHER): Payer: Medicare Other | Admitting: Emergency Medicine

## 2015-07-11 VITALS — BP 120/70 | HR 75 | Wt 181.0 lb

## 2015-07-11 DIAGNOSIS — J449 Chronic obstructive pulmonary disease, unspecified: Secondary | ICD-10-CM

## 2015-07-11 DIAGNOSIS — C3432 Malignant neoplasm of lower lobe, left bronchus or lung: Secondary | ICD-10-CM

## 2015-07-11 MED ORDER — FENTANYL 50 MCG/HR TD PT72: 50.0000 ug | MEDICATED_PATCH | TRANSDERMAL | Status: DC

## 2015-07-11 MED ORDER — OXYCODONE HCL 5 MG PO TABS: 5.0000 mg | ORAL_TABLET | Freq: Four times a day (QID) | ORAL | Status: DC | PRN

## 2015-07-11 MED FILL — fentaNYL 50 MCG/HR PT72: 50 | 30 days supply | Qty: 10 | Fill #0

## 2015-07-11 MED FILL — oxyCODONE HCL 5 MG TABS: 5 | 13 days supply | Qty: 100 | Fill #0

## 2015-07-11 NOTE — Assessment & Plan Note (Addendum)
She has inquired about hospice - I think this is appropriate given her multiple problems. She also inquires about changing to a fentanyl patch to see if it has fewer MS effects. I will write for the patches 64mg, but would prefer for her orthopedists to manage her pain long term. I explained this to them.   Your lung disease places you at moderate to high risk for undergoing general anesthesia. This does not mean that you cannot have a hip surgery, as long as the benefits outweigh these risks.  We will repeat your spirometry today/ Please continue your Breo and your albuterol as you are taking them ' Follow with Dr BLamonte Sakaiin 3 months or sooner if you have any problems.

## 2015-07-11 NOTE — Patient Instructions (Signed)
Your lung disease places you at moderate to high risk for undergoing general anesthesia. This does not mean that you cannot have a hip surgery, as long as the benefits outweigh these risks.  We will repeat your spirometry today/ Please continue your Breo and your albuterol as you are taking them ' Follow with Jodi Frazier in 3 months or sooner if you have any problems.

## 2015-07-11 NOTE — Telephone Encounter (Signed)
Called to schedule surgical clearance, unable to leave msg

## 2015-07-11 NOTE — Assessment & Plan Note (Signed)
Following with Dr Isidore Moos.

## 2015-07-11 NOTE — Progress Notes (Signed)
y7--e Subjective:     Patient ID: Jodi Frazier, female    DOB: Dec 16, 1930, 82 y.o.   MRN: [id number]  HPI 82 yo woman, former smoker (40 pk-yrs), HTN, CAD, adenoCA LLL s/p XRT 3-5/'14, COPD, rhinitis, GERD. She is on Advair + albuterol prn. She has had some increased cough and dyspnea since the end of XRT. Her CT scan chest done 02/22/13 shows shrinkage of LLL mass but some associated GGI in the L lung. She was just started on a pred taper by Dr Lanell Persons for this.   ROV 80/9/14 -- hx adenoCA LLL, COPD. She has undergone XRT to LLL lesion, was seen last time with some associated GGI suspicious for radiation pneumonitis. We treated with prednisone, she never tapered to '40mg'$ , has been on '60mg'$  qd. She is coughing more since last time. Taking advair qd, albuterol prn (2x a week).   ROV 05/04/13 -- hx adenoCA LLL, COPD. She has undergone XRT to LLL lesion, has been rx with pred for suspected radiation pneumonitis / GGI.  At last visit we tapered prednisone to off. We also stopped Advair and start of Breo. We also started protonix and added an allergy regimen for her cough. She has been admitted in the interim for PTX, received more prednisone and is currently tapering. CXR today shows some clearing. She believes that [name] is helping her. She still has nasal gtt.   ROV 06/10/13 --  COPD, hx adenoCA LLL s/p XRT and treated for radiation pneumonitis in 10-05/2013. Hx PTX.Marland Kitchen Also with cough > protonix. ? Loratadine and chlorpheniramine > stopped these. She c/o nausea today, ? Related to increasing the protonic to qd, ? Breo. Her cough appears to better controlled.   Acute OV 11/16/13 -- hx adenoCA LLL s/p XRT and treated for radiation pneumonitis in 10-05/2013. Hx PTX. Also with COPD and allergic rhinitis.  She c/o of dizziness and more dyspnea for the last 7-10 days. She may have some more LE swelling. She just finished 3 XRT to the RUL in March. Has not had and does not want chemo.    ROV 12/14/13 -- hx adenoCA  LLL s/p XRT and treated for radiation pneumonitis in 10-05/2013, followed with Dr Melvyn Novas 5/26 for CP after thora, lung didn't come up completely. The thora didn't change her breathing much at all.   ROV 05/04/14 -- hx adenoCA s/p XRT c/b radiation pneumonitis. Underwent L thora 5/'15 without any changes in her breathing. She had a recent small CVA. She underwent CT chest 10/23 > new areas of nodularity, new LUL soft tissue mass, smaller LLL mass, R hilar adenopathy.  She has spoken with Dr Isidore Moos and is planning for a PET scan - she has deferred this to after the holidays. She is on Breo, seems to be tolerating. She has not needed her SABA except twice ever.               ROV 11/29/14 -- follow-up visit for history of COPD. She also follows with Dr. Isidore Moos for adenocarcinoma of the lung status post radiation therapy and complicated some radiation pneumonitis. She underwent PET scan in 06/28/14 that showed recurrent metastatic disease and enlarging hypermetabolic thoracic nodes.   She has decided not to pursue any more chemotherapy or radiation therapy.  She is feeling well - no current clinical manifestations of her cancer.  She is on Breo, believes that it is helpful.                ROV  03/24/15 --   Follow-up visit for patient with a history of adenocarcinoma of the lung as well as COPD. Currently being treated with Breo,   . Her dyspnea may be a bit worse. Some slight increase in cough. Decreased functional capacity.  She started having L hip pain 6 weeks ago, then started bothering her knees. She is planning for repeat PET scan with Dr Isidore Moos, ? Bony involvement that would benefit from palliation.                                               ROV 04/28/15 -- follow-up visit for COPD and history of adenocarcinoma the lung. She underwent a chest x-ray on 9/23 that showed no significant change in her left pleural effusion. 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 She returns today describing some subjective worsening in her breathing especially when she is in pain. She continues to have hip and shoulder pain - PET scan suggests that this is orthopedic. She underwent cortisol shots without improvement.  She is on Breo once a day.                 ROV 07/11/15 -- follow-up visit for history of COPD and adenocarcinoma of the lung. She has a history of a chronic left pleural effusion. She is currently managed on Breo. She is planning for possible L hip replacement for avascular necrosis.  She is here to discuss pre-op.  She underwent sleeping oximetry on RA - may have been a poor test, but she did have a period of desaturation.  She has been using her SABA more frequently. Remains on Breo.                                    CAT Score 03/24/2015  Total CAT Score 22                                            Review of Systems  Constitutional: Negative for fever and unexpected weight change.  HENT: Positive for rhinorrhea. Negative for congestion, dental problem, ear pain, nosebleeds, postnasal drip, sinus pressure, sneezing, sore throat and trouble swallowing.   Eyes: Negative for redness and itching.  Respiratory: Positive for cough and shortness of breath. Negative for chest tightness and wheezing.   Cardiovascular: Negative for palpitations and leg swelling.  Gastrointestinal: Negative for nausea and vomiting.  Genitourinary: Negative for dysuria.  Musculoskeletal: Negative for joint swelling.  Skin: Negative for rash.  Neurological: Positive for light-headedness. Negative for headaches.  Hematological: Does not bruise/bleed easily.  Psychiatric/Behavioral: Negative for dysphoric mood. The  patient is not nervous/anxious.      Filed Vitals:   07/11/15 1336 07/11/15 1338  BP:  120/70  Pulse:  75  Weight: 181 lb (82.101 kg)   SpO2:  95%    Gen: Pleasant, elderly woman in a wheelchair, in no distress,  normal affect  ENT: No lesions,  mouth clear,  oropharynx clear, no postnasal drip  Neck: No JVD, no TMG, no carotid bruits  Lungs: No use of accessory muscles, clear without rales or rhonchi, no decreased BS on L, no evidence large pleural effusion.   Cardiovascular: RRR, heart sounds normal, no murmur or gallops, trace peripheral edema  Musculoskeletal: No deformities, no cyanosis or clubbing  Neuro: alert, non focal    12/04/14 --  COMPARISON: PET scan 06/28/2014. Chest radiograph earlier today.  FINDINGS: Mediastinum: No filling defects are noted within the pulmonary arterial tree to suggest pulmonary emboli. Heart size is normal. No pericardial fluid, thickening or calcification. No acute abnormality of the thoracic aorta or other great vessels of the mediastinum. No pathologically enlarged mediastinal or hilar lymph nodes. Previous precarinal node noted on PET scan now decreased in size 9 mm short axis. RIGHT hilar node also appears decreased. The esophagus is normal in appearance. Post XRT changes LEFT medial mediastinum. Moderate atheromatous change of the transverse arch and great vessel origins.  Lungs/Pleura: No consolidative airspace disease. Moderate-sized LEFT pleural effusion is stable. Previous pulmonary nodules observed on prior PET scan dated 06/28/2014 are stable to slightly improved.  Musculoskeletal: No aggressive appearing lytic or blastic lesions are noted in the visualized portions of the skeleton. Advanced osteopenia. Discal calcifications.  Review of the MIP images confirms the above findings.  IMPRESSION: Moderate-sized pleural effusion on the LEFT is stable.  No evidence for pulmonary emboli.  Stable to decreased  size of residual metastatic disease in the chest including adenopathy and lung nodules.      Objective:   Physical Exam Filed Vitals:   07/11/15 1336 07/11/15 1338  BP:  120/70  Pulse:  75  Weight: 181 lb (82.101 kg)   SpO2:  95%       Assessment & Plan:  COPD (chronic obstructive pulmonary disease) She has inquired about hospice - I think this is appropriate given her multiple problems. She also inquires about changing to a fentanyl patch to see if it has fewer MS effects. I will write for the patches 3mg, but would prefer for her orthopedists to manage her pain long term. I explained this to them.   Your lung disease places you at moderate to high risk for undergoing general anesthesia. This does not mean that you cannot have a hip surgery, as long as the benefits outweigh these risks.  We will repeat your spirometry today/ Please continue your Breo and your albuterol as you are taking them ' Follow with Dr BLamonte Sakaiin 3 months or sooner if you have any problems.  Primary malignant neoplasm of left lower lobe of lung Following with Dr SIsidore Moos

## 2015-07-13 ENCOUNTER — Telehealth: Payer: Self-pay | Admitting: Emergency Medicine

## 2015-07-13 ENCOUNTER — Encounter: Payer: Self-pay | Admitting: Physician Assistant

## 2015-07-13 MED ORDER — FENTANYL 25 MCG/HR TD PT72: 25.0000 ug | MEDICATED_PATCH | TRANSDERMAL | Status: DC

## 2015-07-13 NOTE — Telephone Encounter (Signed)
[name] has to come pick it up and is wanting to know how long this will take, [phone]

## 2015-07-13 NOTE — Telephone Encounter (Signed)
Mailing letter to schedule appt

## 2015-07-13 NOTE — Telephone Encounter (Signed)
On 07/11/15 OV: fentaNYL (DURAGESIC - DOSED MCG/HR) 50 MCG/HR [[id number] ---  Spoke with pt daughter. She reports the 50 mcg patch made pt very loopy and she tried eating her breakfast place instead of the food. She wants a lower dose of fentanyl. She is not sure how she would even do with the 25 mcg strength. Please advise Dr. Lamonte Sakai thanks

## 2015-07-13 NOTE — Telephone Encounter (Signed)
Spoke with daughter. She is fine with trying the 25 mcg patch. RX printed, signed and placed for pick up. Nothing further needed

## 2015-07-13 NOTE — Telephone Encounter (Signed)
I tried to order the 41mg and could not find it in EPIC. I assumed that 50 was the lowest dose. If we can get the 243m then I would prefer that

## 2015-07-13 NOTE — Telephone Encounter (Signed)
Not able to leave msg to schedule surgical clearance appt

## 2015-07-14 MED FILL — fentaNYL 25 MCG/HR PT72: 25 | 30 days supply | Qty: 10 | Fill #0

## 2015-07-16 ENCOUNTER — Encounter: Payer: Self-pay | Admitting: Physician Assistant

## 2015-07-17 ENCOUNTER — Encounter: Payer: Self-pay | Admitting: Emergency Medicine

## 2015-07-17 DIAGNOSIS — C3490 Malignant neoplasm of unspecified part of unspecified bronchus or lung: Secondary | ICD-10-CM

## 2015-07-17 DIAGNOSIS — J449 Chronic obstructive pulmonary disease, unspecified: Secondary | ICD-10-CM

## 2015-07-17 MED ORDER — GABAPENTIN 100 MG PO CAPS: 100.0000 mg | ORAL_CAPSULE | Freq: Two times a day (BID) | ORAL | Status: AC

## 2015-07-17 MED ORDER — LIDOCAINE 5 % EX PTCH: 1.0000 | MEDICATED_PATCH | CUTANEOUS | Status: AC

## 2015-07-17 MED FILL — GABAPENTIN 100 MG CAPSULE: 100 | 30 days supply | Qty: 60 | Fill #0

## 2015-07-17 MED FILL — LIDOCAINE 5% PATCH: 5 | 30 days supply | Qty: 30 | Fill #0

## 2015-07-18 ENCOUNTER — Telehealth: Payer: Self-pay | Admitting: Emergency Medicine

## 2015-07-18 NOTE — Telephone Encounter (Signed)
Spoke with Amy at [facility], wants to know if RB will be pt's attending physician for hospice, and if he would like to do symptom mgmt or if this should be deferred to hospice docs.    RB please advise.  Thanks!

## 2015-07-18 NOTE — Telephone Encounter (Signed)
I can be his attending. i would appreciate if the hospice MD's managed symptom changes. Thanks.

## 2015-07-18 NOTE — Telephone Encounter (Signed)
lmtcb for Amy

## 2015-07-19 ENCOUNTER — Other Ambulatory Visit: Payer: Self-pay | Admitting: Physician Assistant

## 2015-07-19 ENCOUNTER — Telehealth: Payer: Self-pay | Admitting: *Deleted

## 2015-07-19 ENCOUNTER — Telehealth: Payer: Self-pay | Admitting: Emergency Medicine

## 2015-07-19 MED FILL — cloNIDine HCL 0.1 MG TABS: 0.1 | 30 days supply | Qty: 90 | Fill #1

## 2015-07-19 NOTE — Telephone Encounter (Signed)
Return call.Jodi Frazier °

## 2015-07-19 NOTE — Telephone Encounter (Signed)
PA initiated through [email or web address], awaiting determination. JG//CMA

## 2015-07-19 NOTE — Telephone Encounter (Signed)
Spoke with Amy at [facility], aware that [place] will be attending, but would like hospice MD's to manage symptom changes.  Nothing further needed.

## 2015-07-19 NOTE — Telephone Encounter (Signed)
Spoke with Dr. Jenene Slicker at [facility]. He states that they are not going to admit pt at this time. From looking at her records he says that her cancer is stable. Pt is getting ready to have hip surgery, Dr. Jenene Slicker says that she will more than likely go to a rehab facility. When she is at the rehab facility, she can't be enrolled in Hospice. They will revisit possible admitting her after she recovers from her hip surgery. Advised him that I would like RB know. Nothing further was needed.

## 2015-07-21 ENCOUNTER — Encounter: Payer: Self-pay | Admitting: Physician Assistant

## 2015-07-21 ENCOUNTER — Other Ambulatory Visit: Payer: Self-pay | Admitting: Physician Assistant

## 2015-07-21 MED FILL — MUPIROCIN 2% OINTMENT: 2 | 5 days supply | Qty: 22 | Fill #0

## 2015-07-21 MED FILL — AMLODIPINE BESYLATE 2.5 MG: 2.5 | 90 days supply | Qty: 90 | Fill #0

## 2015-07-21 MED FILL — PANTOPRAZOLE SOD DR 40 MG T: 40 | 90 days supply | Qty: 90 | Fill #0

## 2015-07-21 NOTE — H&P (Signed)
TOTAL HIP ADMISSION H&P  Patient is admitted for left total hip arthroplasty, anterior approach.  Subjective:  Chief Complaint:     Left hip AVN / pain  HPI: Jodi Frazier, 82 y.o. female, has a history of pain and functional disability in the left hip(s) due to arthritis and AVN and patient has failed non-surgical conservative treatments for greater than 12 weeks to include NSAID's and/or analgesics, use of assistive devices and activity modification.  Onset of symptoms was gradual starting 5+ months ago with gradually worsening course since that time.The patient noted no past surgery on the left hip(s).  Patient currently rates pain in the left hip at 10 out of 10 with activity. Patient has night pain, worsening of pain with activity and weight bearing, trendelenberg gait, pain that interfers with activities of daily living and pain with passive range of motion. Patient has evidence of periarticular osteophytes and joint space narrowing by imaging studies. This condition presents safety issues increasing the risk of falls.  There is no current active infection.  Risks, benefits and expectations were discussed with the patient.  Risks including but not limited to the risk of anesthesia, blood clots, nerve damage, blood vessel damage, failure of the prosthesis, infection and up to and including death.  Patient understand the risks, benefits and expectations and wishes to proceed with surgery.   PCP: Leeanne Rio, PA-C  D/C Plans:      Home with HHPT  Post-op Meds:       No Rx given  Tranexamic Acid:      To be given - IV  Decadron:      Is to be given  FYI:     Plavix and ASA  Norco post-op    Patient Active Problem List   Diagnosis Date Noted  . Abnormal urine 06/19/2015  . Acute memory impairment 06/19/2015  . Need for prophylactic vaccination against Streptococcus pneumoniae (pneumococcus) 05/14/2015  . Hyperglycemia 05/14/2015  . HLD (hyperlipidemia) 04/07/2014  .  Essential hypertension 04/07/2014  . History of CVA (cerebrovascular accident) 03/02/2014  . Recurrent UTI 10/15/2013  . Malignant neoplasm of upper lobe, bronchus or lung 08/03/2013  . Orthostatic hypotension 06/29/2013  . Malignant pleural effusion 06/28/2013  . Palliative care encounter 06/28/2013  . Anxiety state, unspecified 06/28/2013  . Anxiety 06/15/2013  . HTN (hypertension) 04/25/2013  . Dementia   . Depression   . GERD (gastroesophageal reflux disease)   . COPD (chronic obstructive pulmonary disease) ([place]) 03/05/2013  . Primary malignant neoplasm of left lower lobe of lung ([place]) 09/18/2012  . Adenocarcinoma of lung [facility])    Past Medical History  Diagnosis Date  . Hypertension   . Colitis   . Coronary artery disease   . High cholesterol     takes Red Yeast Rice and Fish OIl daily  . Kidney stones   . PONV (postoperative nausea and vomiting)   . MI (myocardial infarction) ([place]) 2010  . Shortness of breath     with exertion  . Pneumonia     hx of 20+yrs ago  . COPD (chronic obstructive pulmonary disease) ([facility])     pleurisy or COPD exascerbation > 3yrago  . Headache(784.0)   . Vertigo     HTN related bc gets up too fast  . Arthritis   . Bruises easily   . History of shingles 152yrago    in eye;occasionally gets some residual   . Accessory skin tags     arms/legs  .  GERD (gastroesophageal reflux disease)   . Colitis   . Diverticulosis   . Urinary frequency   . Stress incontinence   . History of kidney stones   . Hypothyroidism     takes sYnthroid daily  . Cataract     early stage on right  . Macular degeneration, wet ([place])   . Depression     some but takes Amitryptylline nightly  . Right-sided chest wall pain 08/28/12    PRESENTATION - RIGHT SIDED ANTERIOR CHEST PAIN  . Weight loss 08/28/12    10 LB WEIGHT LOSS OVER 3 MONTHS  . Lung mass 08/12/12    CHEST-XRAY/ PET -LOBULAR MASS LLL - 4.1 X 3.6 X 4.2 CM  . Dementia     mild  . S/P radiation therapy   10/05/2012-11/20/2012    Left Lower Lung and hilum / 70 Gy in 35 fractions  . Hypertension   . At risk for fall due to comorbid condition     5 falls - last Early December  . Vertigo   . Orthostatic hypotension   . S/P radiation therapy 2/13, 2/20, 2/23 (2015)    SBRT to Right Lung  . Adenocarcinoma of lung ([place]) 09/07/12    needle core bx-LLL-adenocarcinoma    Past Surgical History  Procedure Laterality Date  . Hip arthroscopy  35yrago    right hip-replacement  . Knee arthroscopy      LT  . Total knee arthroplasty  89yrago    left  . Appendectomy  [id number]  . Abdominal hysterectomy  [id number]   . Lithotripsy  2011  . Cataract surgery  2012    left  . Eye surgery  43yr38yr  . Bladder surgery  2001    tacked  . Coronary angioplasty with stent placement  2010    2 in rt coronary artery and 1 in another spot  . Colonoscopy    . Esophagogastroduodenoscopy    . Total knee arthroplasty  09/09/2011    Procedure: TOTAL KNEE ARTHROPLASTY;  Surgeon: StephRudean Haskell  [facility];  Laterality: Right;  Right Total Knee Arthroplasty  . Bronchial brush biopsy Left 08/17/12    LLL Bronchial Washing / Brushing and Bronchial Biopsy: Negative for malignancy  . Left lower lobe needle core biopsy Left 09/07/12    Poorly Differentiated Adenocarcinoma   . Joint replacement      No prescriptions prior to admission   Allergies  Allergen Reactions  . Codeine Hives  . Augmentin [Amoxicillin-Pot Clavulanate] Diarrhea and Nausea And Vomiting  . Penicillins Hives    Has patient had a PCN reaction causing immediate rash, facial/tongue/throat swelling, SOB or lightheadedness with hypotension: No Has patient had a PCN reaction causing severe rash involving mucus membranes or skin necrosis: Hives  Has patient had a PCN reaction that required hospitalization: No Has patient had a PCN reaction occurring within the last 10 years: No     Social History  Substance Use  Topics  . Smoking status: Former Smoker -- 1.00 packs/day for 40 years    Types: Cigarettes    Quit date: 08/26/2002  . Smokeless tobacco: Never Used  . Alcohol Use: Yes     Comment: OCC    Family History  Problem Relation Age of Onset  . Anesthesia problems Mother   . Hypotension Neg Hx   . Malignant hyperthermia Neg Hx   . Pseudochol deficiency Neg Hx   . Cancer Maternal Aunt  breast     Review of Systems  Constitutional: Negative.   HENT: Negative.   Eyes: Negative.   Respiratory: Negative.   Cardiovascular: Negative.   Gastrointestinal: Positive for heartburn.  Genitourinary: Negative.   Musculoskeletal: Positive for joint pain.  Skin: Negative.   Neurological: Negative.   Endo/Heme/Allergies: Negative.   Psychiatric/Behavioral: Positive for depression and memory loss. The patient is nervous/anxious.     Objective:  Physical Exam  Constitutional: She is oriented to person, place, and time. She appears well-developed.  HENT:  Head: Normocephalic.  Eyes: Pupils are equal, round, and reactive to light.  Neck: Neck supple. No JVD present. No tracheal deviation present. No thyromegaly present.  Cardiovascular: Normal rate, regular rhythm, normal heart sounds and intact distal pulses.   Respiratory: Effort normal and breath sounds normal. No stridor. No respiratory distress. She has no wheezes.  GI: Soft. There is no tenderness. There is no guarding.  Musculoskeletal:       Left hip: She exhibits decreased range of motion, decreased strength, tenderness and bony tenderness. She exhibits no swelling, no deformity and no laceration.  Lymphadenopathy:    She has no cervical adenopathy.  Neurological: She is alert and oriented to person, place, and time. A sensory deficit (right UE) is present.  Skin: Skin is warm and dry.  Psychiatric: She has a normal mood and affect.      Labs:  Estimated body mass index is 26.09 kg/(m^2) as calculated from the following:    Height as of 05/10/15: '5\' 11"'$  (1.803 m).   Weight as of 06/09/15: 84.823 kg (187 lb).   Imaging Review Plain radiographs demonstrate severe degenerative joint disease and AVN of the left hip(s). The bone quality appears to be good for age and reported activity level.  Assessment/Plan:  End stage arthritis / AVN, left hip(s)  The patient history, physical examination, clinical judgement of the provider and imaging studies are consistent with end stage degenerative joint disease / AVN of the left hip(s) and total hip arthroplasty is deemed medically necessary. The treatment options including medical management, injection therapy, arthroscopy and arthroplasty were discussed at length. The risks and benefits of total hip arthroplasty were presented and reviewed. The risks due to aseptic loosening, infection, stiffness, dislocation/subluxation,  thromboembolic complications and other imponderables were discussed.  The patient acknowledged the explanation, agreed to proceed with the plan and consent was signed. Patient is being admitted for inpatient treatment for surgery, pain control, PT, OT, prophylactic antibiotics, VTE prophylaxis, progressive ambulation and ADL's and discharge planning.The patient is planning to be discharged home with home health services.      West Pugh Keoki Mchargue   PA-C  07/21/2015, 8:32 AM

## 2015-07-24 ENCOUNTER — Encounter (HOSPITAL_COMMUNITY): Payer: Self-pay | Admitting: *Deleted

## 2015-07-24 NOTE — Progress Notes (Signed)
Pulmonary clearance dr byrnum 07-11-15 on chatrt for 07-25-15 surgery

## 2015-07-24 NOTE — Progress Notes (Signed)
Note placed on pt chart needs nasal betadine swab due to has not finished mupirocin x 10 doses, started  mupirocin1-20-17.

## 2015-07-25 ENCOUNTER — Inpatient Hospital Stay (HOSPITAL_COMMUNITY): Payer: Medicare Other

## 2015-07-25 ENCOUNTER — Encounter (HOSPITAL_COMMUNITY): Payer: Self-pay | Admitting: *Deleted

## 2015-07-25 ENCOUNTER — Inpatient Hospital Stay (HOSPITAL_COMMUNITY)
Admission: RE | Admit: 2015-07-25 | Discharge: 2015-07-27 | DRG: 470 | Disposition: A | Discharge status: Home Health | Payer: Medicare Other | Source: Ambulatory Visit | Attending: Orthopedic Surgery | Admitting: Orthopedic Surgery

## 2015-07-25 ENCOUNTER — Encounter (HOSPITAL_COMMUNITY)
Admission: RE | Disposition: A | Discharge status: Home Health | Payer: Self-pay | Source: Ambulatory Visit | Attending: Orthopedic Surgery

## 2015-07-25 ENCOUNTER — Inpatient Hospital Stay (HOSPITAL_COMMUNITY): Payer: Medicare Other | Admitting: Anesthesiology

## 2015-07-25 DIAGNOSIS — J449 Chronic obstructive pulmonary disease, unspecified: Secondary | ICD-10-CM | POA: Diagnosis present

## 2015-07-25 DIAGNOSIS — Z955 Presence of coronary angioplasty implant and graft: Secondary | ICD-10-CM

## 2015-07-25 DIAGNOSIS — Z923 Personal history of irradiation: Secondary | ICD-10-CM | POA: Diagnosis not present

## 2015-07-25 DIAGNOSIS — H353290 Exudative age-related macular degeneration, unspecified eye, stage unspecified: Secondary | ICD-10-CM | POA: Diagnosis present

## 2015-07-25 DIAGNOSIS — I10 Essential (primary) hypertension: Secondary | ICD-10-CM | POA: Diagnosis present

## 2015-07-25 DIAGNOSIS — Z96653 Presence of artificial knee joint, bilateral: Secondary | ICD-10-CM | POA: Diagnosis present

## 2015-07-25 DIAGNOSIS — Z8673 Personal history of transient ischemic attack (TIA), and cerebral infarction without residual deficits: Secondary | ICD-10-CM | POA: Diagnosis not present

## 2015-07-25 DIAGNOSIS — Z79899 Other long term (current) drug therapy: Secondary | ICD-10-CM

## 2015-07-25 DIAGNOSIS — I2510 Atherosclerotic heart disease of native coronary artery without angina pectoris: Secondary | ICD-10-CM | POA: Diagnosis present

## 2015-07-25 DIAGNOSIS — M87052 Idiopathic aseptic necrosis of left femur: Principal | ICD-10-CM | POA: Diagnosis present

## 2015-07-25 DIAGNOSIS — Z85118 Personal history of other malignant neoplasm of bronchus and lung: Secondary | ICD-10-CM | POA: Diagnosis not present

## 2015-07-25 DIAGNOSIS — K219 Gastro-esophageal reflux disease without esophagitis: Secondary | ICD-10-CM | POA: Diagnosis present

## 2015-07-25 DIAGNOSIS — Z87442 Personal history of urinary calculi: Secondary | ICD-10-CM | POA: Diagnosis not present

## 2015-07-25 DIAGNOSIS — M1612 Unilateral primary osteoarthritis, left hip: Secondary | ICD-10-CM | POA: Diagnosis present

## 2015-07-25 DIAGNOSIS — M25552 Pain in left hip: Secondary | ICD-10-CM | POA: Diagnosis present

## 2015-07-25 DIAGNOSIS — E039 Hypothyroidism, unspecified: Secondary | ICD-10-CM | POA: Diagnosis present

## 2015-07-25 DIAGNOSIS — I252 Old myocardial infarction: Secondary | ICD-10-CM

## 2015-07-25 DIAGNOSIS — Z87891 Personal history of nicotine dependence: Secondary | ICD-10-CM

## 2015-07-25 DIAGNOSIS — Z96649 Presence of unspecified artificial hip joint: Secondary | ICD-10-CM

## 2015-07-25 HISTORY — DX: Nonexudative age-related macular degeneration, unspecified eye, stage unspecified: H35.3190

## 2015-07-25 HISTORY — PX: TOTAL HIP ARTHROPLASTY: SHX124

## 2015-07-25 HISTORY — DX: Laceration without foreign body of unspecified hand, initial encounter: S61.419A

## 2015-07-25 HISTORY — DX: Cerebral infarction, unspecified: I63.9

## 2015-07-25 HISTORY — DX: Idiopathic aseptic necrosis of left femur: M87.052

## 2015-07-25 LAB — CBC
HCT: 39.3 % (ref 36.0–46.0)
Hemoglobin: 12 g/dL (ref 12.0–15.0)
MCH: 25.5 pg — AB (ref 26.0–34.0)
MCHC: 30.5 g/dL (ref 30.0–36.0)
MCV: 83.4 fL (ref 78.0–100.0)
PLATELETS: 374 10*3/uL (ref 150–400)
RBC: 4.71 MIL/uL (ref 3.87–5.11)
RDW: 16.8 % — ABNORMAL HIGH (ref 11.5–15.5)
WBC: 9.2 10*3/uL (ref 4.0–10.5)

## 2015-07-25 LAB — BASIC METABOLIC PANEL
Anion gap: 9 (ref 5–15)
BUN: 15 mg/dL (ref 6–20)
CHLORIDE: 108 mmol/L (ref 101–111)
CO2: 26 mmol/L (ref 22–32)
CREATININE: 0.81 mg/dL (ref 0.44–1.00)
Calcium: 10.1 mg/dL (ref 8.9–10.3)
GFR calc Af Amer: >60 mL/min (ref >60)
Glucose, Bld: 104 mg/dL — ABNORMAL HIGH (ref 65–99)
Potassium: 4.1 mmol/L (ref 3.5–5.1)
SODIUM: 143 mmol/L (ref 135–145)

## 2015-07-25 LAB — PROTIME-INR
INR: 1.14 (ref 0.00–1.49)
PROTHROMBIN TIME: 14.7 s (ref 11.6–15.2)

## 2015-07-25 LAB — TYPE AND SCREEN
ABO/RH(D): O POS
ANTIBODY SCREEN: NEGATIVE

## 2015-07-25 LAB — APTT: aPTT: 30 seconds (ref 24–37)

## 2015-07-25 LAB — ABO/RH: ABO/RH(D): O POS

## 2015-07-25 SURGERY — ARTHROPLASTY, HIP, TOTAL, ANTERIOR APPROACH
Anesthesia: Spinal | Site: Hip | Laterality: Left

## 2015-07-25 MED ORDER — METOCLOPRAMIDE HCL 5 MG PO TABS
5.0000 mg | ORAL_TABLET | Freq: Three times a day (TID) | ORAL | Status: DC | PRN
  Filled 2015-07-25: qty 2

## 2015-07-25 MED ORDER — ALPRAZOLAM 0.25 MG PO TABS
0.2500 mg | ORAL_TABLET | Freq: Two times a day (BID) | ORAL | Status: DC | PRN
  Administered 2015-07-26: 0.25 mg via ORAL
  Filled 2015-07-25: qty 1

## 2015-07-25 MED ORDER — POLYETHYLENE GLYCOL 3350 17 G PO PACK
17.0000 g | PACK | Freq: Two times a day (BID) | ORAL | Status: DC
  Administered 2015-07-26 (×2): 17 g via ORAL

## 2015-07-25 MED ORDER — CARVEDILOL 6.25 MG PO TABS
6.2500 mg | ORAL_TABLET | Freq: Two times a day (BID) | ORAL | Status: DC
  Administered 2015-07-25 – 2015-07-27 (×4): 6.25 mg via ORAL
  Filled 2015-07-25 (×6): qty 1

## 2015-07-25 MED ORDER — MIDAZOLAM HCL 2 MG/2ML IJ SOLN
INTRAMUSCULAR | Status: AC
  Filled 2015-07-25: qty 2

## 2015-07-25 MED ORDER — SIMVASTATIN 10 MG PO TABS
10.0000 mg | ORAL_TABLET | ORAL | Status: DC
Start: 2015-07-27 — End: 2015-07-27
  Administered 2015-07-27: 10 mg via ORAL
  Filled 2015-07-25: qty 1

## 2015-07-25 MED ORDER — ONDANSETRON HCL 4 MG/2ML IJ SOLN
INTRAMUSCULAR | Status: DC | PRN
  Administered 2015-07-25 (×2): 2 mg via INTRAVENOUS

## 2015-07-25 MED ORDER — AMITRIPTYLINE HCL 25 MG PO TABS
25.0000 mg | ORAL_TABLET | Freq: Every day | ORAL | Status: DC
  Administered 2015-07-25 – 2015-07-26 (×2): 25 mg via ORAL
  Filled 2015-07-25 (×3): qty 1

## 2015-07-25 MED ORDER — EPHEDRINE SULFATE 50 MG/ML IJ SOLN
INTRAMUSCULAR | Status: DC | PRN
  Administered 2015-07-25: 5 mg via INTRAVENOUS
  Administered 2015-07-25 (×4): 10 mg via INTRAVENOUS

## 2015-07-25 MED ORDER — LEVOTHYROXINE SODIUM 125 MCG PO TABS
125.0000 ug | ORAL_TABLET | Freq: Every day | ORAL | Status: DC
  Administered 2015-07-26 – 2015-07-27 (×2): 125 ug via ORAL
  Filled 2015-07-25 (×3): qty 1

## 2015-07-25 MED ORDER — SODIUM CHLORIDE 0.9 % IV SOLN
100.0000 mL/h | INTRAVENOUS | Status: DC
  Administered 2015-07-25 – 2015-07-26 (×2): 100 mL/h via INTRAVENOUS
  Filled 2015-07-25 (×6): qty 1000

## 2015-07-25 MED ORDER — HYDROCODONE-ACETAMINOPHEN 7.5-325 MG PO TABS
1.0000 | ORAL_TABLET | ORAL | Status: DC
  Administered 2015-07-25 – 2015-07-27 (×8): 1 via ORAL
  Filled 2015-07-25 (×2): qty 1
  Filled 2015-07-25: qty 2
  Filled 2015-07-25: qty 1
  Filled 2015-07-25: qty 2
  Filled 2015-07-25: qty 1
  Filled 2015-07-25: qty 2
  Filled 2015-07-25 (×2): qty 1

## 2015-07-25 MED ORDER — HYDROMORPHONE HCL 1 MG/ML IJ SOLN: 0.2500 mg | INTRAMUSCULAR | Status: DC | PRN

## 2015-07-25 MED ORDER — VANCOMYCIN HCL IN DEXTROSE 1-5 GM/200ML-% IV SOLN
1000.0000 mg | INTRAVENOUS | Status: AC
  Administered 2015-07-25: 1000 mg via INTRAVENOUS
  Filled 2015-07-25: qty 200

## 2015-07-25 MED ORDER — DOCUSATE SODIUM 100 MG PO CAPS
100.0000 mg | ORAL_CAPSULE | Freq: Two times a day (BID) | ORAL | Status: DC
  Administered 2015-07-25 – 2015-07-27 (×4): 100 mg via ORAL

## 2015-07-25 MED ORDER — CLOPIDOGREL BISULFATE 75 MG PO TABS
75.0000 mg | ORAL_TABLET | Freq: Every day | ORAL | Status: DC
  Administered 2015-07-26 – 2015-07-27 (×2): 75 mg via ORAL
  Filled 2015-07-25 (×3): qty 1

## 2015-07-25 MED ORDER — FERROUS SULFATE 325 (65 FE) MG PO TABS
325.0000 mg | ORAL_TABLET | Freq: Three times a day (TID) | ORAL | Status: DC
  Administered 2015-07-25 – 2015-07-27 (×4): 325 mg via ORAL
  Filled 2015-07-25 (×8): qty 1

## 2015-07-25 MED ORDER — MIDAZOLAM HCL 5 MG/5ML IJ SOLN
INTRAMUSCULAR | Status: DC | PRN
  Administered 2015-07-25: 0.5 mg via INTRAVENOUS
  Administered 2015-07-25: 1 mg via INTRAVENOUS

## 2015-07-25 MED ORDER — NITROGLYCERIN 0.4 MG SL SUBL: 0.4000 mg | SUBLINGUAL_TABLET | SUBLINGUAL | Status: DC | PRN

## 2015-07-25 MED ORDER — SODIUM CHLORIDE 0.9 % IR SOLN
Status: DC | PRN
  Administered 2015-07-25: 1000 mL

## 2015-07-25 MED ORDER — VANCOMYCIN HCL IN DEXTROSE 1-5 GM/200ML-% IV SOLN
1000.0000 mg | Freq: Two times a day (BID) | INTRAVENOUS | Status: AC
  Administered 2015-07-25: 1000 mg via INTRAVENOUS
  Filled 2015-07-25: qty 200

## 2015-07-25 MED ORDER — DEXAMETHASONE SODIUM PHOSPHATE 10 MG/ML IJ SOLN
10.0000 mg | Freq: Once | INTRAMUSCULAR | Status: AC
  Administered 2015-07-26: 10 mg via INTRAVENOUS
  Filled 2015-07-25 (×2): qty 1

## 2015-07-25 MED ORDER — TRANEXAMIC ACID 1000 MG/10ML IV SOLN
1000.0000 mg | Freq: Once | INTRAVENOUS | Status: AC
  Administered 2015-07-25: 1000 mg via INTRAVENOUS
  Filled 2015-07-25: qty 10

## 2015-07-25 MED ORDER — METHOCARBAMOL 1000 MG/10ML IJ SOLN
500.0000 mg | Freq: Four times a day (QID) | INTRAMUSCULAR | Status: DC | PRN
  Administered 2015-07-26: 500 mg via INTRAVENOUS
  Filled 2015-07-25 (×3): qty 5

## 2015-07-25 MED ORDER — LACTATED RINGERS IV SOLN
INTRAVENOUS | Status: DC
  Administered 2015-07-25 (×2): via INTRAVENOUS

## 2015-07-25 MED ORDER — ASPIRIN EC 81 MG PO TBEC
81.0000 mg | DELAYED_RELEASE_TABLET | Freq: Every day | ORAL | Status: DC
  Administered 2015-07-26 – 2015-07-27 (×2): 81 mg via ORAL
  Filled 2015-07-25 (×2): qty 1

## 2015-07-25 MED ORDER — CHLORHEXIDINE GLUCONATE 4 % EX LIQD: 60.0000 mL | Freq: Once | CUTANEOUS | Status: DC

## 2015-07-25 MED ORDER — AMLODIPINE BESYLATE 2.5 MG PO TABS
2.5000 mg | ORAL_TABLET | Freq: Every day | ORAL | Status: DC
  Administered 2015-07-26 – 2015-07-27 (×2): 2.5 mg via ORAL
  Filled 2015-07-25 (×2): qty 1

## 2015-07-25 MED ORDER — PROPOFOL 500 MG/50ML IV EMUL
INTRAVENOUS | Status: DC | PRN
  Administered 2015-07-25: 75 ug/kg/min via INTRAVENOUS

## 2015-07-25 MED ORDER — PHENYLEPHRINE HCL 10 MG/ML IJ SOLN
10.0000 mg | INTRAVENOUS | Status: DC | PRN
  Administered 2015-07-25: 20 ug/min via INTRAVENOUS

## 2015-07-25 MED ORDER — HYDROMORPHONE HCL 1 MG/ML IJ SOLN
0.5000 mg | INTRAMUSCULAR | Status: DC | PRN
  Administered 2015-07-26 (×2): 0.5 mg via INTRAVENOUS
  Filled 2015-07-25 (×2): qty 1

## 2015-07-25 MED ORDER — DEXAMETHASONE SODIUM PHOSPHATE 10 MG/ML IJ SOLN
10.0000 mg | Freq: Once | INTRAMUSCULAR | Status: AC
  Administered 2015-07-25: 10 mg via INTRAVENOUS

## 2015-07-25 MED ORDER — PANTOPRAZOLE SODIUM 40 MG PO TBEC
40.0000 mg | DELAYED_RELEASE_TABLET | Freq: Every morning | ORAL | Status: DC
  Administered 2015-07-26 – 2015-07-27 (×2): 40 mg via ORAL
  Filled 2015-07-25 (×2): qty 1

## 2015-07-25 MED ORDER — CELECOXIB 200 MG PO CAPS
200.0000 mg | ORAL_CAPSULE | Freq: Two times a day (BID) | ORAL | Status: DC
  Administered 2015-07-25 – 2015-07-27 (×4): 200 mg via ORAL
  Filled 2015-07-25 (×6): qty 1

## 2015-07-25 MED ORDER — LIDOCAINE HCL 1 % IJ SOLN
INTRAMUSCULAR | Status: AC
  Filled 2015-07-25: qty 20

## 2015-07-25 MED ORDER — PROPOFOL 10 MG/ML IV BOLUS
INTRAVENOUS | Status: AC
  Filled 2015-07-25: qty 40

## 2015-07-25 MED ORDER — PROMETHAZINE HCL 25 MG/ML IJ SOLN: 6.2500 mg | INTRAMUSCULAR | Status: DC | PRN

## 2015-07-25 MED ORDER — DONEPEZIL HCL 5 MG PO TABS
15.0000 mg | ORAL_TABLET | Freq: Every day | ORAL | Status: DC
Start: 2015-07-25 — End: 2015-07-27
  Administered 2015-07-25 – 2015-07-26 (×2): 15 mg via ORAL
  Filled 2015-07-25 (×3): qty 1

## 2015-07-25 MED ORDER — ONDANSETRON HCL 4 MG/2ML IJ SOLN
INTRAMUSCULAR | Status: AC
  Filled 2015-07-25: qty 2

## 2015-07-25 MED ORDER — ALBUTEROL SULFATE (2.5 MG/3ML) 0.083% IN NEBU: 2.5000 mg | INHALATION_SOLUTION | Freq: Four times a day (QID) | RESPIRATORY_TRACT | Status: DC | PRN

## 2015-07-25 MED ORDER — PHENYLEPHRINE HCL 10 MG/ML IJ SOLN
INTRAMUSCULAR | Status: DC | PRN
  Administered 2015-07-25: 120 ug via INTRAVENOUS
  Administered 2015-07-25: 80 ug via INTRAVENOUS
  Administered 2015-07-25: 120 ug via INTRAVENOUS
  Administered 2015-07-25: 40 ug via INTRAVENOUS
  Administered 2015-07-25: 80 ug via INTRAVENOUS

## 2015-07-25 MED ORDER — ALUM & MAG HYDROXIDE-SIMETH 200-200-20 MG/5ML PO SUSP: 30.0000 mL | ORAL | Status: DC | PRN

## 2015-07-25 MED ORDER — PHENOL 1.4 % MT LIQD: 1.0000 | OROMUCOSAL | Status: DC | PRN

## 2015-07-25 MED ORDER — DIPHENHYDRAMINE HCL 25 MG PO CAPS: 25.0000 mg | ORAL_CAPSULE | Freq: Four times a day (QID) | ORAL | Status: DC | PRN

## 2015-07-25 MED ORDER — MESALAMINE 1.2 G PO TBEC
1.2000 g | DELAYED_RELEASE_TABLET | Freq: Every day | ORAL | Status: DC
  Administered 2015-07-25 – 2015-07-26 (×2): 1.2 g via ORAL
  Filled 2015-07-25 (×3): qty 1

## 2015-07-25 MED ORDER — ESCITALOPRAM OXALATE 10 MG PO TABS
10.0000 mg | ORAL_TABLET | Freq: Every day | ORAL | Status: DC
  Administered 2015-07-25 – 2015-07-27 (×3): 10 mg via ORAL
  Filled 2015-07-25 (×3): qty 1

## 2015-07-25 MED ORDER — METHOCARBAMOL 500 MG PO TABS
500.0000 mg | ORAL_TABLET | Freq: Four times a day (QID) | ORAL | Status: DC | PRN
  Administered 2015-07-25 – 2015-07-26 (×2): 500 mg via ORAL
  Filled 2015-07-25 (×2): qty 1

## 2015-07-25 MED ORDER — METOCLOPRAMIDE HCL 5 MG/ML IJ SOLN: 5.0000 mg | Freq: Three times a day (TID) | INTRAMUSCULAR | Status: DC | PRN

## 2015-07-25 MED ORDER — ONDANSETRON HCL 4 MG/2ML IJ SOLN: 4.0000 mg | Freq: Four times a day (QID) | INTRAMUSCULAR | Status: DC | PRN

## 2015-07-25 MED ORDER — ONDANSETRON HCL 4 MG PO TABS: 4.0000 mg | ORAL_TABLET | Freq: Four times a day (QID) | ORAL | Status: DC | PRN

## 2015-07-25 MED ORDER — BISACODYL 10 MG RE SUPP: 10.0000 mg | Freq: Every day | RECTAL | Status: DC | PRN

## 2015-07-25 MED ORDER — ALBUTEROL SULFATE HFA 108 (90 BASE) MCG/ACT IN AERS: 2.0000 | INHALATION_SPRAY | Freq: Four times a day (QID) | RESPIRATORY_TRACT | Status: DC | PRN

## 2015-07-25 MED ORDER — FLUTICASONE FUROATE-VILANTEROL 100-25 MCG/INH IN AEPB
1.0000 | INHALATION_SPRAY | Freq: Every day | RESPIRATORY_TRACT | Status: DC
  Filled 2015-07-25 (×2): qty 1

## 2015-07-25 MED ORDER — MENTHOL 3 MG MT LOZG: 1.0000 | LOZENGE | OROMUCOSAL | Status: DC | PRN

## 2015-07-25 MED ORDER — PHENYLEPHRINE HCL 10 MG/ML IJ SOLN
INTRAMUSCULAR | Status: AC
  Filled 2015-07-25: qty 1

## 2015-07-25 MED ORDER — DEXAMETHASONE SODIUM PHOSPHATE 10 MG/ML IJ SOLN
INTRAMUSCULAR | Status: AC
  Filled 2015-07-25: qty 1

## 2015-07-25 MED ORDER — BUPIVACAINE IN DEXTROSE 0.75-8.25 % IT SOLN
INTRATHECAL | Status: DC | PRN
  Administered 2015-07-25: 2 mL via INTRATHECAL

## 2015-07-25 MED ORDER — PROPOFOL 10 MG/ML IV BOLUS
INTRAVENOUS | Status: DC | PRN
  Administered 2015-07-25: 20 mg via INTRAVENOUS

## 2015-07-25 MED ORDER — MAGNESIUM CITRATE PO SOLN: 1.0000 | Freq: Once | ORAL | Status: DC | PRN

## 2015-07-25 MED ORDER — GABAPENTIN 100 MG PO CAPS
100.0000 mg | ORAL_CAPSULE | Freq: Two times a day (BID) | ORAL | Status: DC | PRN
  Filled 2015-07-25: qty 1

## 2015-07-25 SURGICAL SUPPLY — 34 items
BAG DECANTER FOR FLEXI CONT (MISCELLANEOUS) IMPLANT
BAG ZIPLOCK 12X15 (MISCELLANEOUS) IMPLANT
CAPT HIP TOTAL 2 ×3 IMPLANT
CLOTH BEACON ORANGE TIMEOUT ST (SAFETY) ×3 IMPLANT
COVER PERINEAL POST (MISCELLANEOUS) ×3 IMPLANT
DRAPE STERI IOBAN 125X83 (DRAPES) ×3 IMPLANT
DRAPE U-SHAPE 47X51 STRL (DRAPES) ×6 IMPLANT
DRSG AQUACEL AG ADV 3.5X10 (GAUZE/BANDAGES/DRESSINGS) ×3 IMPLANT
DURAPREP 26ML APPLICATOR (WOUND CARE) ×3 IMPLANT
ELECT REM PT RETURN 15FT ADLT (MISCELLANEOUS) IMPLANT
ELECT REM PT RETURN 9FT ADLT (ELECTROSURGICAL) ×3
ELECTRODE REM PT RTRN 9FT ADLT (ELECTROSURGICAL) ×1 IMPLANT
GLOVE BIOGEL M 7.0 STRL (GLOVE) IMPLANT
GLOVE BIOGEL PI IND STRL 7.5 (GLOVE) ×1 IMPLANT
GLOVE BIOGEL PI IND STRL 8.5 (GLOVE) ×1 IMPLANT
GLOVE BIOGEL PI INDICATOR 7.5 (GLOVE) ×2
GLOVE BIOGEL PI INDICATOR 8.5 (GLOVE) ×2
GLOVE ECLIPSE 8.0 STRL XLNG CF (GLOVE) ×6 IMPLANT
GLOVE ORTHO TXT STRL SZ7.5 (GLOVE) ×3 IMPLANT
GOWN STRL REUS W/TWL LRG LVL3 (GOWN DISPOSABLE) ×3 IMPLANT
GOWN STRL REUS W/TWL XL LVL3 (GOWN DISPOSABLE) ×3 IMPLANT
HOLDER FOLEY CATH W/STRAP (MISCELLANEOUS) ×3 IMPLANT
LIQUID BAND (GAUZE/BANDAGES/DRESSINGS) ×3 IMPLANT
PACK ANTERIOR HIP CUSTOM (KITS) ×3 IMPLANT
SAW OSC TIP CART 19.5X105X1.3 (SAW) ×3 IMPLANT
SUT MNCRL AB 4-0 PS2 18 (SUTURE) ×3 IMPLANT
SUT VIC AB 1 CT1 36 (SUTURE) ×9 IMPLANT
SUT VIC AB 2-0 CT1 27 (SUTURE) ×4
SUT VIC AB 2-0 CT1 TAPERPNT 27 (SUTURE) ×2 IMPLANT
SUT VLOC 180 0 24IN GS25 (SUTURE) ×3 IMPLANT
TRAY FOLEY W/METER SILVER 14FR (SET/KITS/TRAYS/PACK) ×3 IMPLANT
TRAY FOLEY W/METER SILVER 16FR (SET/KITS/TRAYS/PACK) IMPLANT
WATER STERILE IRR 1500ML POUR (IV SOLUTION) ×3 IMPLANT
YANKAUER SUCT BULB TIP 10FT TU (MISCELLANEOUS) IMPLANT

## 2015-07-25 NOTE — Progress Notes (Signed)
Jodi Frazier fell about 1 week ago walking to bathroom she became dizzy and slid down wall and scrapped top of right hand. Large bandaid noted on top of right hand.  Bandaid is clean dry and intact and was placed on last night after her bath.

## 2015-07-25 NOTE — Telephone Encounter (Signed)
Continue Gabapentin. They need to speak with Surgeon regarding pain medication after surgery since the lidocaine was not approved.

## 2015-07-25 NOTE — Interval H&P Note (Signed)
History and Physical Interval Note:  07/25/2015 11:55 AM  Jodi Frazier  has presented today for surgery, with the diagnosis of left hip avascular necrosis  The various methods of treatment have been discussed with the patient and family. After consideration of risks, benefits and other options for treatment, the patient has consented to  Procedure(s): LEFT TOTAL HIP ARTHROPLASTY ANTERIOR APPROACH (Left) as a surgical intervention .  The patient's history has been reviewed, patient examined, no change in status, stable for surgery.  I have reviewed the patient's chart and labs.  Questions were answered to the patient's satisfaction.     Mauri Pole

## 2015-07-25 NOTE — Op Note (Signed)
NAME:  Jodi Frazier                ACCOUNT NO.: [id number]      MEDICAL RECORD NO.: [id number]      FACILITY:  [facility]      PHYSICIAN:  Paralee Cancel D  DATE OF BIRTH:  19-Apr-1931     DATE OF PROCEDURE:  07/25/2015                                 OPERATIVE REPORT         PREOPERATIVE DIAGNOSIS: Left  hip osteoarthritis.      POSTOPERATIVE DIAGNOSIS:  Left hip osteoarthritis with significant femoral head degenerative changes      PROCEDURE:  Left total hip replacement through an anterior approach   utilizing DePuy THR system, component size 30m pinnacle cup, a size 36+4 neutral   Altrex liner, a size 8 Hi Tri Lock stem with a 36+5 Articuleze metal head ball.      SURGEON:  MPietro Cassis OAlvan Dame M.D.      ASSISTANT:  BNehemiah Massed PA-C      ANESTHESIA:  Spinal.      SPECIMENS:  None.      COMPLICATIONS:  None.      BLOOD LOSS:  200 cc     DRAINS:  None      INDICATION OF THE PROCEDURE:  Jodi Frazier a 82y.o. female who had   presented to office for evaluation of left hip pain.  Radiographs revealed   progressive degenerative changes with bone-on-bone   articulation to the  hip joint.  She had significant destruction of her femoral head.  The patient had painful limited range of   motion significantly affecting their overall quality of life.  The patient was failing to    respond to conservative measures, and at this point was ready   to proceed with more definitive measures.  The patient has noted progressive   degenerative changes in his hip, progressive problems and dysfunction   with regarding the hip prior to surgery.  Consent was obtained for   benefit of pain relief.  Specific risk of infection, DVT, component   failure, dislocation, need for revision surgery, as well discussion of   the anterior versus posterior approach were reviewed.  Consent was   obtained for benefit of anterior pain relief through an anterior   approach.      PROCEDURE IN DETAIL:  The patient was brought to operative theater.   Once adequate anesthesia, preoperative antibiotics, 1 gm of Vancomycin, 1 gm of Tranexamic Acid, and 10 mg of Decadron administered.   The patient was positioned supine on the OSI Hanna table.  Once adequate   padding of boney process was carried out, we had predraped out the hip, and  used fluoroscopy to confirm orientation of the pelvis and position.      The left hip was then prepped and draped from proximal iliac crest to   mid thigh with shower curtain technique.      Time-out was performed identifying the patient, planned procedure, and   extremity.     An incision was then made 2 cm distal and lateral to the   anterior superior iliac spine extending over the orientation of the   tensor fascia lata muscle and sharp dissection was carried down to the   fascia of the muscle  and protractor placed in the soft tissues.      The fascia was then incised.  The muscle belly was identified and swept   laterally and retractor placed along the superior neck.  Following   cauterization of the circumflex vessels and removing some pericapsular   fat, a second cobra retractor was placed on the inferior neck.  A third   retractor was placed on the anterior acetabulum after elevating the   anterior rectus.  A L-capsulotomy was along the line of the   superior neck to the trochanteric fossa, then extended proximally and   distally.  Tag sutures were placed and the retractors were then placed   intracapsular.  We then identified the trochanteric fossa and   orientation of my neck cut, confirmed this radiographically   and then made a neck osteotomy with the femur on traction.  The femoral   head was removed without difficulty or complication.  Traction was let   off and retractors were placed posterior and anterior around the   acetabulum.      The labrum and foveal tissue were debrided.  I began reaming with a 37m   reamer and  reamed up to 538mreamer with good bony bed preparation and a 5255m cup was chosen.  The final 51m74mnnacle cup was then impacted under fluoroscopy  to confirm the depth of penetration and orientation with respect to   abduction.  A screw was placed followed by the hole eliminator.  The final   36+4 neutral Altrex liner was impacted with good visualized rim fit.  The cup was positioned anatomically within the acetabular portion of the pelvis.      At this point, the femur was rolled at 80 degrees.  Further capsule was   released off the inferior aspect of the femoral neck.  I then   released the superior capsule proximally.  The hook was placed laterally   along the femur and elevated manually and held in position with the bed   hook.  The leg was then extended and adducted with the leg rolled to 100   degrees of external rotation.  Once the proximal femur was fully   exposed, I used a box osteotome to set orientation.  I then began   broaching with the starting chili pepper broach and passed this by hand and then broached up to 8.  With the 8 broach in place I chose a high offset neck and did trial reductions.  The offset was appropriate, leg lengths   appeared to be significantly improved with the +5 head ball, confirmed radiographically.   Given these findings, I went ahead and dislocated the hip, repositioned all   retractors and positioned the right hip in the extended and abducted position.  The final 8Hi Tri Lock stem was   chosen and it was impacted down to the level of neck cut.  Based on this   and the trial reduction, a 36+5 Articuleze metal head ball was chosen and   impacted onto a clean and dry trunnion, and the hip was reduced.  The   hip had been irrigated throughout the case again at this point.  I did   reapproximate the superior capsular leaflet to the anterior leaflet   using #1 Vicryl.  The fascia of the   tensor fascia lata muscle was then reapproximated using #1 Vicryl  and # 0 V-lock sutures.  The   remaining wound was closed with  2-0 Vicryl and running 4-0 Monocryl.   The hip was cleaned, dried, and dressed sterilely using Dermabond and   Aquacel dressing.  She was then brought   to recovery room in stable condition tolerating the procedure well.    Nehemiah Massed, PA-C was present for the entirety of the case involved from   preoperative positioning, perioperative retractor management, general   facilitation of the case, as well as primary wound closure as assistant.            Pietro Cassis Alvan Dame, M.D.        07/25/2015 2:29 PM

## 2015-07-25 NOTE — Anesthesia Preprocedure Evaluation (Addendum)
Anesthesia Evaluation  Patient identified by MRN, date of birth, ID band Patient awake    Reviewed: Allergy & Precautions, NPO status , Patient's Chart, lab work & pertinent test results  History of Anesthesia Complications (+) PONV and history of anesthetic complications  Airway Mallampati: II  TM Distance: >3 FB Neck ROM: Full    Dental no notable dental hx.    Pulmonary shortness of breath and with exertion, pneumonia, COPD, former smoker,    Pulmonary exam normal breath sounds clear to auscultation       Cardiovascular hypertension, Pt. on medications + CAD and + Past MI  Normal cardiovascular exam Rhythm:Regular Rate:Normal  Study Conclusions  - Left ventricle: The cavity size was normal. There was mild concentric hypertrophy. Systolic function was vigorous. The estimated ejection fraction was in the range of 65% to 70%. Wall motion was normal; there were no regional wall motion abnormalities. Doppler parameters are consistent with abnormal left ventricular relaxation (grade 1 diastolic dysfunction). - Aortic valve: Trileaflet; normal thickness leaflets. There was no regurgitation. - Aortic root: The aortic root was normal in size. - Mitral valve: Calcified annulus. There was no regurgitation. - Right ventricle: Systolic function was normal. - Right atrium: The atrium was normal in size. - Pulmonary arteries: Systolic pressure can&'t be assessed on current study. - Inferior vena cava: The vessel was normal in size. - Pericardium, extracardiac: There was no pericardial effusion.    Neuro/Psych  Headaches, PSYCHIATRIC DISORDERS Anxiety Depression CVA, No Residual Symptoms    GI/Hepatic Neg liver ROS, GERD  ,  Endo/Other  Hypothyroidism   Renal/GU Renal disease  negative genitourinary   Musculoskeletal  (+) Arthritis ,   Abdominal   Peds negative pediatric ROS (+)  Hematology negative  hematology ROS (+)   Anesthesia Other Findings   Reproductive/Obstetrics negative OB ROS                          Anesthesia Physical Anesthesia Plan  ASA: III  Anesthesia Plan: Spinal   Post-op Pain Management:    Induction: Intravenous  Airway Management Planned: Natural Airway  Additional Equipment:   Intra-op Plan:   Post-operative Plan:   Informed Consent: I have reviewed the patients History and Physical, chart, labs and discussed the procedure including the risks, benefits and alternatives for the proposed anesthesia with the patient or authorized representative who has indicated his/her understanding and acceptance.   Dental advisory given  Plan Discussed with: CRNA  Anesthesia Plan Comments: (Discussed risks and benefits of and differences between spinal and general. Discussed risks of spinal including headache, backache, failure, bleeding and hematoma, infection, and nerve damage. Patient consents to spinal. Questions answered. Coagulation studies and platelet count acceptable.  Off plavix for 8 days.)       Anesthesia Quick Evaluation

## 2015-07-25 NOTE — Anesthesia Postprocedure Evaluation (Signed)
Anesthesia Post Note  Patient: Jodi Frazier  Procedure(s) Performed: Procedure(s) (LRB): LEFT TOTAL HIP ARTHROPLASTY ANTERIOR APPROACH (Left)  Patient location during evaluation: PACU Anesthesia Type: Spinal Level of consciousness: awake and alert Pain management: pain level controlled Vital Signs Assessment: post-procedure vital signs reviewed and stable Respiratory status: spontaneous breathing, nonlabored ventilation, respiratory function stable and patient connected to nasal cannula oxygen Cardiovascular status: blood pressure returned to baseline and stable Postop Assessment: no signs of nausea or vomiting Anesthetic complications: no    Last Vitals:  Filed Vitals:   07/25/15 1603 07/25/15 1700  BP: 161/60 159/57  Pulse: 58 63  Temp: 36.3 C 36.3 C  Resp:  15    Last Pain:  Filed Vitals:   07/25/15 1719  PainSc: 0-No pain                 [name] L

## 2015-07-25 NOTE — Anesthesia Procedure Notes (Signed)
Spinal Patient location during procedure: OR Start time: 07/25/2015 1:04 PM End time: 07/25/2015 1:06 PM Reason for block: at surgeon's request Staffing Resident/CRNA: Freddie Breech Performed by: resident/CRNA  Preanesthetic Checklist Completed: patient identified, site marked, surgical consent, pre-op evaluation, timeout performed, IV checked, risks and benefits discussed, monitors and equipment checked and at surgeon's request Spinal Block Patient position: sitting Prep: Betadine Patient monitoring: continuous pulse ox, blood pressure and heart rate Approach: midline Location: L3-4 Injection technique: single-shot Needle Needle type: Whitacre and Spinocan  Needle gauge: 22 G Needle length: 9 cm Assessment Sensory level: T6 Additional Notes Anesthesiologist at bedside.  Expiration date of kit checked and confirmed. Patient tolerated procedure well, without complications. X 1 attempt with noted clear CSF return. Loss of motor and sensory on exam post injection.

## 2015-07-25 NOTE — Telephone Encounter (Signed)
Received Denial fax from [employer] for Medicare/Medicaid, stating the following reasons:   "Lidocaine transdermal patch is denied because the use is not supported by the [place] or by one of the medicare approved references for treating your medical condition[s] : Pain in unspecified hip. Coverage requires that the requested drug has been recognized for the treatment of your medical condition[s] by one of the following Medicare approved compendia:"  (1) [place]; OR (2) Micromedex [place].  Patient is scheduled to be having LEFT TOTAL HIP ARTHROPLASTY ANTERIOR APPROACH Surgery today [07/25/15]; Please Advise/SLS

## 2015-07-25 NOTE — Transfer of Care (Signed)
Immediate Anesthesia Transfer of Care Note  Patient: Jodi Frazier  Procedure(s) Performed: Procedure(s): LEFT TOTAL HIP ARTHROPLASTY ANTERIOR APPROACH (Left)  Patient Location: PACU  Anesthesia Type:Regional  Level of Consciousness: awake, alert  and oriented  Airway & Oxygen Therapy: Patient Spontanous Breathing and Patient connected to face mask oxygen  Post-op Assessment: Report given to RN and Post -op Vital signs reviewed and stable  Post vital signs: Reviewed and stable  Last Vitals:  Filed Vitals:   07/25/15 1043  BP: 155/58  Pulse: 58  Temp: 36.3 C  Resp: 16    Complications: No apparent anesthesia complications

## 2015-07-25 NOTE — Progress Notes (Signed)
Utilization review completed.  

## 2015-07-25 NOTE — Discharge Instructions (Signed)

## 2015-07-26 LAB — CBC
HCT: 36.8 % (ref 36.0–46.0)
Hemoglobin: 11.2 g/dL — ABNORMAL LOW (ref 12.0–15.0)
MCH: 25.7 pg — ABNORMAL LOW (ref 26.0–34.0)
MCHC: 30.4 g/dL (ref 30.0–36.0)
MCV: 84.4 fL (ref 78.0–100.0)
PLATELETS: 367 10*3/uL (ref 150–400)
RBC: 4.36 MIL/uL (ref 3.87–5.11)
RDW: 16.7 % — AB (ref 11.5–15.5)
WBC: 17.2 10*3/uL — AB (ref 4.0–10.5)

## 2015-07-26 LAB — BASIC METABOLIC PANEL
ANION GAP: 6 (ref 5–15)
BUN: 17 mg/dL (ref 6–20)
CALCIUM: 9.4 mg/dL (ref 8.9–10.3)
CO2: 23 mmol/L (ref 22–32)
Chloride: 108 mmol/L (ref 101–111)
Creatinine, Ser: 0.91 mg/dL (ref 0.44–1.00)
GFR, EST NON AFRICAN AMERICAN: 57 mL/min — AB (ref >60)
Glucose, Bld: 118 mg/dL — ABNORMAL HIGH (ref 65–99)
POTASSIUM: 4.9 mmol/L (ref 3.5–5.1)
SODIUM: 137 mmol/L (ref 135–145)

## 2015-07-26 MED ORDER — ASPIRIN 81 MG PO TBEC: 81.0000 mg | DELAYED_RELEASE_TABLET | Freq: Every day | ORAL | Status: AC

## 2015-07-26 MED ORDER — HYDROCODONE-ACETAMINOPHEN 7.5-325 MG PO TABS: 1.0000 | ORAL_TABLET | ORAL | Status: DC | PRN

## 2015-07-26 MED ORDER — DOCUSATE SODIUM 100 MG PO CAPS: 100.0000 mg | ORAL_CAPSULE | Freq: Two times a day (BID) | ORAL | Status: AC

## 2015-07-26 MED ORDER — FERROUS SULFATE 325 (65 FE) MG PO TABS: 325.0000 mg | ORAL_TABLET | Freq: Three times a day (TID) | ORAL | Status: AC

## 2015-07-26 MED ORDER — TIZANIDINE HCL 4 MG PO TABS: 4.0000 mg | ORAL_TABLET | Freq: Four times a day (QID) | ORAL | Status: DC | PRN

## 2015-07-26 MED ORDER — POLYETHYLENE GLYCOL 3350 17 G PO PACK: 17.0000 g | PACK | Freq: Two times a day (BID) | ORAL | Status: AC

## 2015-07-26 MED FILL — HYDROCODON-APAP 7.5-325: 7.5-325 | 9 days supply | Qty: 100 | Fill #0

## 2015-07-26 MED FILL — tiZANidine HCL 4 MG TABS: 4 | 10 days supply | Qty: 40 | Fill #0

## 2015-07-26 NOTE — Progress Notes (Signed)
     Subjective: 1 Day Post-Op Procedure(s) (LRB): LEFT TOTAL HIP ARTHROPLASTY ANTERIOR APPROACH (Left)   Patient reports pain as mild, pain controlled.  Daughter is with her this AM.  States that she got really confused after receiving Dilaudid last night. This morning as the daughter and I are asking questions, she is getting more and more appropriate. No other events throughout the night.  Objective:   VITALS:   Filed Vitals:   07/26/15 0200 07/26/15 0523  BP: 106/44 120/47  Pulse: 66 63  Temp: 97.5 F (36.4 C) 97.8 F (36.6 C)  Resp: 16 16    Dorsiflexion/Plantar flexion intact Incision: dressing C/D/I No cellulitis present Compartment soft  LABS  Recent Labs  07/25/15 1150 07/26/15 0410  HGB 12.0 11.2*  HCT 39.3 36.8  WBC 9.2 17.2*  PLT 374 367     Recent Labs  07/25/15 1150 07/26/15 0410  NA 143 137  K 4.1 4.9  BUN 15 17  CREATININE 0.81 0.91  GLUCOSE 104* 118*     Assessment/Plan: 1 Day Post-Op Procedure(s) (LRB): LEFT TOTAL HIP ARTHROPLASTY ANTERIOR APPROACH (Left) Foley cath d/c'ed Advance diet Up with therapy D/C IV fluids Discharge home with home health if she clear mentally she can go later if doing well with PT. If issues continue will check on her again tomorrow.     [place] Pugh Jeannetta Cerutti   PAC  07/26/2015, 9:30 AM

## 2015-07-26 NOTE — Progress Notes (Signed)
Prescriptions given to daughter at her request. Patient gives permission to give prescriptions to daughter.

## 2015-07-26 NOTE — Progress Notes (Signed)
Physical Therapy Treatment Patient Details Name: Jodi Frazier MRN: [id number] DOB: 1930/10/30 Today's Date: 07/26/2015    History of Present Illness Pt s/p L THR 2* AVN - pt has been nonambulatory x ~2 weeks 2* hip pain.  Pt with hx of CAD, COPD, vertigo, MI(10) and CVA     PT Comments    Pt continues cooperative but ltd by c/o dizziness with standing.  BP sitting 161/61, standing 145/55.  RN aware  Follow Up Recommendations  Home health PT     Equipment Recommendations  None recommended by PT    Recommendations for Other Services OT consult     Precautions / Restrictions Precautions Precautions: Fall Restrictions Weight Bearing Restrictions: No Other Position/Activity Restrictions: WBAT    Mobility  Bed Mobility Overal bed mobility: Needs Assistance;+ 2 for safety/equipment;+2 for physical assistance Bed Mobility: Supine to Sit;Sit to Supine     Supine to sit: Min assist;+2 for physical assistance;+2 for safety/equipment Sit to supine: Min assist;+2 for physical assistance;+2 for safety/equipment   General bed mobility comments: cues for sequence and use of L LE to self assist.  Physical assist to manage R LE and to control trunk in/oob  Transfers Overall transfer level: Needs assistance Equipment used: Rolling walker (2 wheeled) Transfers: Sit to/from Stand Sit to Stand: Min assist;+2 physical assistance;+2 safety/equipment;From elevated surface         General transfer comment: cues for LE management and use of UEs to self assist  Ambulation/Gait Ambulation/Gait assistance: Min assist;+2 physical assistance;+2 safety/equipment Ambulation Distance (Feet): 3 Feet Assistive device: Rolling walker (2 wheeled) Gait Pattern/deviations: Step-to pattern;Decreased step length - right;Decreased step length - left;Shuffle     General Gait Details: Pt with increasing dizziness in standing.  Pt stepped back to bed and side-stepped to top of bed only   Stairs             Wheelchair Mobility    Modified Rankin (Stroke Patients Only)       Balance                                    Cognition Arousal/Alertness: Awake/alert Behavior During Therapy: WFL for tasks assessed/performed Overall Cognitive Status: History of cognitive impairments - at baseline                      Exercises Total Joint Exercises Ankle Circles/Pumps: AROM;Both;15 reps;Supine Heel Slides: AAROM;Left;20 reps;Supine Hip ABduction/ADduction: AAROM;Left;15 reps;Supine    General Comments        Pertinent Vitals/Pain Pain Assessment: 0-10 Pain Score: 4  Pain Location: L hip Pain Descriptors / Indicators: Aching;Sore Pain Intervention(s): Limited activity within patient's tolerance;Monitored during session;Premedicated before session;Ice applied    Home Living Family/patient expects to be discharged to:: Private residence Living Arrangements: Children Available Help at Discharge: Family Type of Home: House Home Access: Stairs to enter Entrance Stairs-Rails: None Home Layout: One level Home Equipment: Bedside commode;Walker - 4 wheels;Shower seat      Prior Function Level of Independence: Needs assistance  Gait / Transfers Assistance Needed: RW and hx of falls ADL's / Homemaking Assistance Needed: daughter assists patient with all ADLs Comments: Pt has 24/7 assisit of family   PT Goals (current goals can now be found in the care plan section) Acute Rehab PT Goals Patient Stated Goal: Walk again PT Goal Formulation: With patient Time For Goal Achievement: 07/31/15 Potential to Achieve  Goals: Fair Progress towards PT goals: Progressing toward goals    Frequency  7X/week    PT Plan Current plan remains appropriate    Co-evaluation   Reason for Co-Treatment: For patient/therapist safety PT goals addressed during session: Mobility/safety with mobility OT goals addressed during session: ADL's and self-care     End of  Session Equipment Utilized During Treatment: Gait belt Activity Tolerance: Patient limited by fatigue;Other (comment) (dizziness) Patient left: in bed;with call bell/phone within reach     Time: 1430-1510 PT Time Calculation (min) (ACUTE ONLY): 40 min  Charges:  $Gait Training: 8-22 mins $Therapeutic Exercise: 8-22 mins $Therapeutic Activity: 8-22 mins                    G Codes:      Jodi Frazier 2015/08/15, 4:18 PM

## 2015-07-26 NOTE — Progress Notes (Signed)
Occupational Therapy Evaluation Patient Details Name: Jodi Frazier MRN: [id number] DOB: 23-Mar-1931 Today's Date: 07/26/2015    History of Present Illness Pt s/p L THR 2* AVN - pt has been nonambulatory x ~2 weeks 2* hip pain.  Pt with hx of CAD, COPD, vertigo, MI(10) and CVA    Clinical Impression   Patient s/p L THR presents with decreased ADL independence and post-op pain and dizziness limiting function. Patient plans to d/c home with 24/7 family assistance. OT will follow.    Follow Up Recommendations  Home health OT;Supervision/Assistance - 24 hour    Equipment Recommendations  None recommended by OT    Recommendations for Other Services       Precautions / Restrictions Precautions Precautions: Fall Restrictions Weight Bearing Restrictions: No Other Position/Activity Restrictions: WBAT      Mobility Bed Mobility Overal bed mobility: Needs Assistance;+ 2 for safety/equipment;+2 for physical assistance Bed Mobility: Supine to Sit;Sit to Supine     Supine to sit: Min assist;+2 for physical assistance;+2 for safety/equipment Sit to supine: Min assist;+2 for physical assistance;+2 for safety/equipment   General bed mobility comments: cues for sequence and use of L LE to self assist.  Physical assist to manage R LE and to control trunk in/oob  Transfers Overall transfer level: Needs assistance Equipment used: Rolling walker (2 wheeled) Transfers: Sit to/from Stand Sit to Stand: Min assist;+2 physical assistance;+2 safety/equipment;From elevated surface         General transfer comment: cues for LE management and use of UEs to self assist    Balance                                            ADL Overall ADL's : Needs assistance/impaired Eating/Feeding: Set up;Bed level   Grooming: Minimal assistance;Bed level       Lower Body Bathing: Total assistance       Lower Body Dressing: Total assistance               Functional  mobility during ADLs: Minimal assistance;+2 for physical assistance;+2 for safety/equipment;Rolling walker General ADL Comments: Patient reports she has 24/7 assistance with ADLs by daughters and has had 2 recent falls. Reports recent weakness. Shaky on evaluation. Limited eval due to patient's complaints of dizziness in sitting and standing. BP 141/106 and HR 96bpm. Patient returned to supine in bed at end of session.     Vision     Perception     Praxis      Pertinent Vitals/Pain Pain Assessment: 0-10 Pain Score: 5  Pain Location: L hip Pain Descriptors / Indicators: Aching;Sore Pain Intervention(s): Limited activity within patient's tolerance;Monitored during session;Premedicated before session;Ice applied     Hand Dominance Right   Extremity/Trunk Assessment Upper Extremity Assessment Upper Extremity Assessment: Generalized weakness   Lower Extremity Assessment Lower Extremity Assessment: Defer to PT evaluation   Cervical / Trunk Assessment Cervical / Trunk Assessment: Normal   Communication Communication Communication: Expressive difficulties   Cognition Arousal/Alertness: Awake/alert Behavior During Therapy: WFL for tasks assessed/performed Overall Cognitive Status: History of cognitive impairments - at baseline                     General Comments       Exercises       Shoulder Instructions      Home Living Family/patient expects to be discharged to::  Private residence Living Arrangements: Children Available Help at Discharge: Family Type of Home: House Home Access: Stairs to enter Technical brewer of Steps: 1 Entrance Stairs-Rails: None Home Layout: One level     Bathroom Shower/Tub: Occupational psychologist: Handicapped height     Home Equipment: Bedside commode;Walker - 4 wheels;Shower seat          Prior Functioning/Environment Level of Independence: Needs assistance  Gait / Transfers Assistance Needed: RW and hx  of falls ADL's / Homemaking Assistance Needed: daughter assists patient with all ADLs   Comments: Pt has 24/7 assisit of family    OT Diagnosis: Generalized weakness;Acute pain   OT Problem List: Decreased strength;Decreased activity tolerance;Impaired balance (sitting and/or standing);Decreased cognition;Decreased safety awareness;Decreased knowledge of use of DME or AE;Pain   OT Treatment/Interventions: Self-care/ADL training;DME and/or AE instruction;Therapeutic activities;Balance training    OT Goals(Current goals can be found in the care plan section) Acute Rehab OT Goals Patient Stated Goal: Walk again OT Goal Formulation: With patient Time For Goal Achievement: 08/09/15 Potential to Achieve Goals: Fair  OT Frequency: Min 2X/week   Barriers to D/C:            Co-evaluation PT/OT/SLP Co-Evaluation/Treatment: Yes Reason for Co-Treatment: For patient/therapist safety PT goals addressed during session: Mobility/safety with mobility OT goals addressed during session: ADL's and self-care      End of Session Equipment Utilized During Treatment: Rolling walker Nurse Communication: Mobility status  Activity Tolerance: Patient tolerated treatment well Patient left: in bed;with call bell/phone within reach;with bed alarm set   Time: 0930-1006 OT Time Calculation (min): 36 min Charges:  OT General Charges $OT Visit: 1 Procedure OT Evaluation $OT Eval Moderate Complexity: 1 Procedure G-Codes:    Meshawn Oconnor A 07-31-15, 1:24 PM

## 2015-07-26 NOTE — Evaluation (Signed)
Physical Therapy Evaluation Patient Details Name: Jodi Frazier MRN: [id number] DOB: [birth year] Today's Date: 07/26/2015   History of Present Illness  Pt s/p L THR 2* AVN - pt has been nonambulatory x ~2 weeks 2* hip pain.  Pt with hx of CAD, COPD, vertigo, MI(10) and CVA   Clinical Impression  Pt s/p L THR presents with decreased R LE strength/ROM and post op pain and dizziness limiting functional mobility.  Pt plans dc home with 24/7 assist of family and would benefit from follow up [place].    Follow Up Recommendations Home health PT    Equipment Recommendations  None recommended by PT    Recommendations for Other Services OT consult     Precautions / Restrictions Precautions Precautions: Fall Restrictions Weight Bearing Restrictions: No Other Position/Activity Restrictions: WBAT      Mobility  Bed Mobility Overal bed mobility: Needs Assistance;+ 2 for safety/equipment;+2 for physical assistance Bed Mobility: Supine to Sit;Sit to Supine     Supine to sit: Min assist;+2 for physical assistance;+2 for safety/equipment Sit to supine: Min assist;+2 for physical assistance;+2 for safety/equipment   General bed mobility comments: cues for sequence and use of L LE to self assist.  Physical assist to manage R LE and to control trunk in/oob  Transfers Overall transfer level: Needs assistance Equipment used: Rolling walker (2 wheeled) Transfers: Sit to/from Stand Sit to Stand: Min assist;+2 physical assistance;+2 safety/equipment;From elevated surface         General transfer comment: cues for LE management and use of UEs to self assist  Ambulation/Gait             General Gait Details: Pt to standing only but with c/o dizziness (BP 141/106).  Pt stood x 4-5 min but tolerating min weight on R LE 2* c/o pain.  Pt to sitting with c/o increasing dizziness.  Stairs            Wheelchair Mobility    Modified Rankin (Stroke Patients Only)       Balance                                              Pertinent Vitals/Pain Pain Assessment: 0-10 Pain Score: 5  Pain Location: L hip Pain Descriptors / Indicators: Aching;Sore Pain Intervention(s): Limited activity within patient's tolerance;Monitored during session;Premedicated before session;Ice applied    Home Living Family/patient expects to be discharged to:: Private residence Living Arrangements: Children Available Help at Discharge: Family Type of Home: House Home Access: Stairs to enter Entrance Stairs-Rails: None Entrance Stairs-Number of Steps: 1 Home Layout: One level Home Equipment: Bedside commode;Walker - 4 wheels;Shower seat      Prior Function Level of Independence: Needs assistance   Gait / Transfers Assistance Needed: RW and hx of falls     Comments: Pt has 24/7 assisit of family     Hand Dominance        Extremity/Trunk Assessment   Upper Extremity Assessment: Defer to OT evaluation           Lower Extremity Assessment: LLE deficits/detail      Cervical / Trunk Assessment: Normal  Communication   Communication: No difficulties  Cognition Arousal/Alertness: Awake/alert Behavior During Therapy: WFL for tasks assessed/performed Overall Cognitive Status: History of cognitive impairments - at baseline  General Comments      Exercises        Assessment/Plan    PT Assessment Patient needs continued PT services  PT Diagnosis Difficulty walking   PT Problem List Decreased strength;Decreased range of motion;Decreased activity tolerance;Decreased balance;Decreased mobility;Decreased knowledge of use of DME;Pain;Decreased safety awareness;Decreased knowledge of precautions  PT Treatment Interventions DME instruction;Gait training;Stair training;Functional mobility training;Therapeutic activities;Therapeutic exercise;Cognitive remediation;Patient/family education   PT Goals (Current goals can be found in  the Care Plan section) Acute Rehab PT Goals Patient Stated Goal: Walk again PT Goal Formulation: With patient Time For Goal Achievement: 07/31/15 Potential to Achieve Goals: Fair    Frequency 7X/week   Barriers to discharge        Co-evaluation PT/OT/SLP Co-Evaluation/Treatment: Yes Reason for Co-Treatment: For patient/therapist safety PT goals addressed during session: Mobility/safety with mobility OT goals addressed during session: ADL's and self-care       End of Session Equipment Utilized During Treatment: Gait belt Activity Tolerance: Patient limited by fatigue Patient left: in bed;with call bell/phone within reach Nurse Communication: Mobility status         Time: 0511-0211 PT Time Calculation (min) (ACUTE ONLY): 36 min   Charges:   PT Evaluation $PT Eval Low Complexity: 1 Procedure     PT G Codes:        Jodi Frazier 2015/08/25, 12:47 PM

## 2015-07-26 NOTE — Care Management Note (Signed)
Case Management Note  Patient Details  Name: Jodi Frazier MRN: [id number] Date of Birth: April 08, 1931  Subjective/Objective:                  LEFT TOTAL HIP ARTHROPLASTY ANTERIOR APPROACH (Left)  Action/Plan: Discharge planning Expected Discharge Date:  1/256/17               Expected Discharge Plan:  [place]  In-House Referral:     Discharge planning Services  CM Consult  Post Acute Care Choice:  Home Health Choice offered to:  Patient  DME Arranged:  N/A DME Agency:  NA  HH Arranged:  PT HH Agency:  [place]  Status of Service:  Completed, signed off  Medicare Important Message Given:    Date Medicare IM Given:    Medicare IM give by:    Date Additional Medicare IM Given:    Additional Medicare Important Message give by:     If discussed at [place] of Stay Meetings, dates discussed:    Additional Comments: CM met with pt in room to offer choice of home health agency.  Pt chooses [facility] to render HHPT.  Referral called to [employer], Tim.  Pt has DME from previous surgery and does not need additional DME.  No other CM needs were communicated. Dellie Catholic, RN 07/26/2015, 11:53 AM

## 2015-07-27 LAB — BASIC METABOLIC PANEL
Anion gap: 8 (ref 5–15)
BUN: 22 mg/dL — AB (ref 6–20)
CHLORIDE: 109 mmol/L (ref 101–111)
CO2: 23 mmol/L (ref 22–32)
CREATININE: 0.95 mg/dL (ref 0.44–1.00)
Calcium: 9.8 mg/dL (ref 8.9–10.3)
GFR calc Af Amer: >60 mL/min (ref >60)
GFR calc non Af Amer: 54 mL/min — ABNORMAL LOW (ref >60)
Glucose, Bld: 125 mg/dL — ABNORMAL HIGH (ref 65–99)
Potassium: 4.4 mmol/L (ref 3.5–5.1)
SODIUM: 140 mmol/L (ref 135–145)

## 2015-07-27 LAB — CBC
HEMATOCRIT: 35 % — AB (ref 36.0–46.0)
HEMOGLOBIN: 10.6 g/dL — AB (ref 12.0–15.0)
MCH: 25.5 pg — AB (ref 26.0–34.0)
MCHC: 30.3 g/dL (ref 30.0–36.0)
MCV: 84.3 fL (ref 78.0–100.0)
Platelets: 345 10*3/uL (ref 150–400)
RBC: 4.15 MIL/uL (ref 3.87–5.11)
RDW: 16.8 % — AB (ref 11.5–15.5)
WBC: 14.7 10*3/uL — ABNORMAL HIGH (ref 4.0–10.5)

## 2015-07-27 NOTE — Progress Notes (Signed)
     Subjective: 2 Days Post-Op Procedure(s) (LRB): LEFT TOTAL HIP ARTHROPLASTY ANTERIOR APPROACH (Left)   Patient reports pain as mild, pain appears to be controlled.  The patient's daugther is with her and feels that a lot of the confusion the patient had the first day from the medicines has cleared.  No events throughout the night.  Ready to be discharged home, daughter states that she feel good about her mom going home and that she will be able to care for her.  Objective:   VITALS:   Filed Vitals:   07/26/15 2154 07/27/15 0545  BP: 177/59 187/64  Pulse: 68 65  Temp: 98.9 F (37.2 C) 98.1 F (36.7 C)  Resp: 15 16    Dorsiflexion/Plantar flexion intact Incision: dressing C/D/I No cellulitis present Compartment soft  LABS  Recent Labs  07/25/15 1150 07/26/15 0410 07/27/15 0533  HGB 12.0 11.2* 10.6*  HCT 39.3 36.8 35.0*  WBC 9.2 17.2* 14.7*  PLT 374 367 345     Recent Labs  07/25/15 1150 07/26/15 0410 07/27/15 0533  NA 143 137 140  K 4.1 4.9 4.4  BUN 15 17 22*  CREATININE 0.81 0.91 0.95  GLUCOSE 104* 118* 125*     Assessment/Plan: 2 Days Post-Op Procedure(s) (LRB): LEFT TOTAL HIP ARTHROPLASTY ANTERIOR APPROACH (Left) Up with therapy Discharge home with home health  Follow up in 2 weeks at [facility]. Follow up with OLIN,Demyah Smyre D in 2 weeks.  Contact information:  [facility] [address] [place] Zoanne [place]   PAC  07/27/2015, 7:54 AM

## 2015-07-27 NOTE — Progress Notes (Signed)
Pt to d/c home with [facility]. New Aquacel in place per PA instructions. AVS reviewed and "My Chart" discussed with pt. Pt/family capable of verbalizing medications, signs and symptoms of infection, and follow-up appointments. Remains hemodynamically stable. No signs and symptoms of distress. Educated pt to return to ER in the case of SOB, dizziness, or chest pain.

## 2015-07-30 ENCOUNTER — Inpatient Hospital Stay (HOSPITAL_COMMUNITY)
Admission: EM | Admit: 2015-07-30 | Discharge: 2015-08-02 | DRG: 871 | Disposition: A | Discharge status: Home Health | Payer: Medicare Other | Attending: Internal Medicine | Admitting: Internal Medicine

## 2015-07-30 ENCOUNTER — Emergency Department (HOSPITAL_COMMUNITY): Payer: Medicare Other

## 2015-07-30 ENCOUNTER — Encounter (HOSPITAL_COMMUNITY): Payer: Self-pay | Admitting: Emergency Medicine

## 2015-07-30 DIAGNOSIS — Z9660 Presence of unspecified orthopedic joint implant: Secondary | ICD-10-CM | POA: Diagnosis not present

## 2015-07-30 DIAGNOSIS — I252 Old myocardial infarction: Secondary | ICD-10-CM | POA: Diagnosis not present

## 2015-07-30 DIAGNOSIS — E785 Hyperlipidemia, unspecified: Secondary | ICD-10-CM | POA: Diagnosis present

## 2015-07-30 DIAGNOSIS — Z8619 Personal history of other infectious and parasitic diseases: Secondary | ICD-10-CM | POA: Diagnosis not present

## 2015-07-30 DIAGNOSIS — Z885 Allergy status to narcotic agent status: Secondary | ICD-10-CM | POA: Diagnosis not present

## 2015-07-30 DIAGNOSIS — Z881 Allergy status to other antibiotic agents status: Secondary | ICD-10-CM

## 2015-07-30 DIAGNOSIS — Z87440 Personal history of urinary (tract) infections: Secondary | ICD-10-CM | POA: Diagnosis not present

## 2015-07-30 DIAGNOSIS — Z7902 Long term (current) use of antithrombotics/antiplatelets: Secondary | ICD-10-CM | POA: Diagnosis not present

## 2015-07-30 DIAGNOSIS — I10 Essential (primary) hypertension: Secondary | ICD-10-CM | POA: Diagnosis present

## 2015-07-30 DIAGNOSIS — Z6825 Body mass index (BMI) 25.0-25.9, adult: Secondary | ICD-10-CM

## 2015-07-30 DIAGNOSIS — G9340 Encephalopathy, unspecified: Secondary | ICD-10-CM | POA: Diagnosis present

## 2015-07-30 DIAGNOSIS — Z803 Family history of malignant neoplasm of breast: Secondary | ICD-10-CM | POA: Diagnosis not present

## 2015-07-30 DIAGNOSIS — E039 Hypothyroidism, unspecified: Secondary | ICD-10-CM | POA: Diagnosis present

## 2015-07-30 DIAGNOSIS — Z79899 Other long term (current) drug therapy: Secondary | ICD-10-CM | POA: Diagnosis not present

## 2015-07-30 DIAGNOSIS — N390 Urinary tract infection, site not specified: Secondary | ICD-10-CM | POA: Diagnosis present

## 2015-07-30 DIAGNOSIS — C3492 Malignant neoplasm of unspecified part of left bronchus or lung: Secondary | ICD-10-CM | POA: Diagnosis present

## 2015-07-30 DIAGNOSIS — I2510 Atherosclerotic heart disease of native coronary artery without angina pectoris: Secondary | ICD-10-CM | POA: Diagnosis present

## 2015-07-30 DIAGNOSIS — Z87891 Personal history of nicotine dependence: Secondary | ICD-10-CM

## 2015-07-30 DIAGNOSIS — Z9221 Personal history of antineoplastic chemotherapy: Secondary | ICD-10-CM | POA: Diagnosis not present

## 2015-07-30 DIAGNOSIS — A419 Sepsis, unspecified organism: Secondary | ICD-10-CM | POA: Diagnosis present

## 2015-07-30 DIAGNOSIS — Z955 Presence of coronary angioplasty implant and graft: Secondary | ICD-10-CM | POA: Diagnosis not present

## 2015-07-30 DIAGNOSIS — J449 Chronic obstructive pulmonary disease, unspecified: Secondary | ICD-10-CM | POA: Diagnosis not present

## 2015-07-30 DIAGNOSIS — Z66 Do not resuscitate: Secondary | ICD-10-CM | POA: Diagnosis present

## 2015-07-30 DIAGNOSIS — R4182 Altered mental status, unspecified: Secondary | ICD-10-CM | POA: Diagnosis present

## 2015-07-30 DIAGNOSIS — H3530 Unspecified macular degeneration: Secondary | ICD-10-CM | POA: Diagnosis present

## 2015-07-30 DIAGNOSIS — Z880 Allergy status to penicillin: Secondary | ICD-10-CM | POA: Diagnosis not present

## 2015-07-30 DIAGNOSIS — J189 Pneumonia, unspecified organism: Secondary | ICD-10-CM | POA: Insufficient documentation

## 2015-07-30 DIAGNOSIS — J90 Pleural effusion, not elsewhere classified: Secondary | ICD-10-CM | POA: Diagnosis present

## 2015-07-30 DIAGNOSIS — J181 Lobar pneumonia, unspecified organism: Secondary | ICD-10-CM | POA: Diagnosis present

## 2015-07-30 DIAGNOSIS — Z8673 Personal history of transient ischemic attack (TIA), and cerebral infarction without residual deficits: Secondary | ICD-10-CM | POA: Diagnosis not present

## 2015-07-30 DIAGNOSIS — Z923 Personal history of irradiation: Secondary | ICD-10-CM

## 2015-07-30 DIAGNOSIS — E7800 Pure hypercholesterolemia, unspecified: Secondary | ICD-10-CM | POA: Diagnosis present

## 2015-07-30 DIAGNOSIS — R451 Restlessness and agitation: Secondary | ICD-10-CM | POA: Diagnosis present

## 2015-07-30 DIAGNOSIS — K219 Gastro-esophageal reflux disease without esophagitis: Secondary | ICD-10-CM | POA: Diagnosis present

## 2015-07-30 DIAGNOSIS — J440 Chronic obstructive pulmonary disease with acute lower respiratory infection: Secondary | ICD-10-CM | POA: Diagnosis present

## 2015-07-30 DIAGNOSIS — E46 Unspecified protein-calorie malnutrition: Secondary | ICD-10-CM | POA: Diagnosis present

## 2015-07-30 DIAGNOSIS — Z96642 Presence of left artificial hip joint: Secondary | ICD-10-CM | POA: Diagnosis present

## 2015-07-30 DIAGNOSIS — Y95 Nosocomial condition: Secondary | ICD-10-CM | POA: Diagnosis present

## 2015-07-30 DIAGNOSIS — Z87442 Personal history of urinary calculi: Secondary | ICD-10-CM | POA: Diagnosis not present

## 2015-07-30 DIAGNOSIS — R938 Abnormal findings on diagnostic imaging of other specified body structures: Secondary | ICD-10-CM | POA: Diagnosis not present

## 2015-07-30 DIAGNOSIS — B9620 Unspecified Escherichia coli [E. coli] as the cause of diseases classified elsewhere: Secondary | ICD-10-CM | POA: Diagnosis present

## 2015-07-30 DIAGNOSIS — Z96651 Presence of right artificial knee joint: Secondary | ICD-10-CM | POA: Diagnosis present

## 2015-07-30 DIAGNOSIS — F329 Major depressive disorder, single episode, unspecified: Secondary | ICD-10-CM | POA: Diagnosis present

## 2015-07-30 LAB — CBC WITH DIFFERENTIAL/PLATELET
Basophils Absolute: 0 10*3/uL (ref 0.0–0.1)
Basophils Relative: 0 %
EOS ABS: 0.1 10*3/uL (ref 0.0–0.7)
Eosinophils Relative: 0 %
HCT: 36.6 % (ref 36.0–46.0)
HEMOGLOBIN: 11.5 g/dL — AB (ref 12.0–15.0)
LYMPHS ABS: 1.7 10*3/uL (ref 0.7–4.0)
LYMPHS PCT: 11 %
MCH: 25.6 pg — AB (ref 26.0–34.0)
MCHC: 31.4 g/dL (ref 30.0–36.0)
MCV: 81.3 fL (ref 78.0–100.0)
MONOS PCT: 8 %
Monocytes Absolute: 1.2 10*3/uL — ABNORMAL HIGH (ref 0.1–1.0)
NEUTROS PCT: 81 %
Neutro Abs: 12.7 10*3/uL — ABNORMAL HIGH (ref 1.7–7.7)
Platelets: 444 10*3/uL — ABNORMAL HIGH (ref 150–400)
RBC: 4.5 MIL/uL (ref 3.87–5.11)
RDW: 16.9 % — ABNORMAL HIGH (ref 11.5–15.5)
WBC: 15.6 10*3/uL — ABNORMAL HIGH (ref 4.0–10.5)

## 2015-07-30 LAB — COMPREHENSIVE METABOLIC PANEL
ALK PHOS: 131 U/L — AB (ref 38–126)
ALT: 16 U/L (ref 14–54)
ANION GAP: 11 (ref 5–15)
AST: 23 U/L (ref 15–41)
Albumin: 2.9 g/dL — ABNORMAL LOW (ref 3.5–5.0)
BILIRUBIN TOTAL: 0.8 mg/dL (ref 0.3–1.2)
BUN: 20 mg/dL (ref 6–20)
CALCIUM: 8.8 mg/dL — AB (ref 8.9–10.3)
CO2: 22 mmol/L (ref 22–32)
CREATININE: 0.91 mg/dL (ref 0.44–1.00)
Chloride: 101 mmol/L (ref 101–111)
GFR, EST NON AFRICAN AMERICAN: 57 mL/min — AB (ref >60)
Glucose, Bld: 113 mg/dL — ABNORMAL HIGH (ref 65–99)
Potassium: 3.4 mmol/L — ABNORMAL LOW (ref 3.5–5.1)
SODIUM: 134 mmol/L — AB (ref 135–145)
TOTAL PROTEIN: 6.3 g/dL — AB (ref 6.5–8.1)

## 2015-07-30 LAB — URINALYSIS, ROUTINE W REFLEX MICROSCOPIC
BILIRUBIN URINE: NEGATIVE
GLUCOSE, UA: NEGATIVE mg/dL
HGB URINE DIPSTICK: NEGATIVE
Ketones, ur: NEGATIVE mg/dL
Nitrite: NEGATIVE
PROTEIN: NEGATIVE mg/dL
Specific Gravity, Urine: 1.017 (ref 1.005–1.030)
pH: 7 (ref 5.0–8.0)

## 2015-07-30 LAB — I-STAT TROPONIN, ED: TROPONIN I, POC: 0.01 ng/mL (ref 0.00–0.08)

## 2015-07-30 LAB — CBG MONITORING, ED: Glucose-Capillary: 116 mg/dL — ABNORMAL HIGH (ref 65–99)

## 2015-07-30 LAB — URINE MICROSCOPIC-ADD ON
RBC / HPF: NONE SEEN RBC/hpf (ref 0–5)
SQUAMOUS EPITHELIAL / LPF: NONE SEEN

## 2015-07-30 MED ORDER — DEXTROSE 5 % IV SOLN
1.0000 g | Freq: Three times a day (TID) | INTRAVENOUS | Status: DC
  Filled 2015-07-30 (×2): qty 1

## 2015-07-30 MED ORDER — ACETAMINOPHEN 325 MG PO TABS: 650.0000 mg | ORAL_TABLET | Freq: Four times a day (QID) | ORAL | Status: DC | PRN

## 2015-07-30 MED ORDER — HALOPERIDOL LACTATE 5 MG/ML IJ SOLN
2.0000 mg | Freq: Four times a day (QID) | INTRAMUSCULAR | Status: DC | PRN
  Administered 2015-07-30 – 2015-08-01 (×3): 2 mg via INTRAVENOUS
  Filled 2015-07-30 (×4): qty 1

## 2015-07-30 MED ORDER — LORAZEPAM 2 MG/ML IJ SOLN
0.5000 mg | Freq: Once | INTRAMUSCULAR | Status: AC
  Administered 2015-07-30: 0.5 mg via INTRAVENOUS
  Filled 2015-07-30: qty 1

## 2015-07-30 MED ORDER — KETOROLAC TROMETHAMINE 15 MG/ML IJ SOLN
15.0000 mg | Freq: Once | INTRAMUSCULAR | Status: AC
  Administered 2015-07-30: 15 mg via INTRAVENOUS
  Filled 2015-07-30: qty 1

## 2015-07-30 MED ORDER — PANTOPRAZOLE SODIUM 40 MG IV SOLR
40.0000 mg | INTRAVENOUS | Status: DC
  Administered 2015-07-30 – 2015-07-31 (×2): 40 mg via INTRAVENOUS
  Filled 2015-07-30 (×2): qty 40

## 2015-07-30 MED ORDER — DEXTROSE 5 % IV SOLN
1.0000 g | INTRAVENOUS | Status: DC
  Administered 2015-07-30 – 2015-08-01 (×3): 1 g via INTRAVENOUS
  Filled 2015-07-30 (×4): qty 10

## 2015-07-30 MED ORDER — ACETAMINOPHEN 650 MG RE SUPP: 650.0000 mg | Freq: Four times a day (QID) | RECTAL | Status: DC | PRN

## 2015-07-30 MED ORDER — ACETAMINOPHEN 650 MG RE SUPP
650.0000 mg | Freq: Once | RECTAL | Status: AC
  Administered 2015-07-30: 650 mg via RECTAL
  Filled 2015-07-30: qty 1

## 2015-07-30 MED ORDER — KETOROLAC TROMETHAMINE 15 MG/ML IJ SOLN
15.0000 mg | Freq: Four times a day (QID) | INTRAMUSCULAR | Status: DC | PRN
  Administered 2015-07-31 (×2): 15 mg via INTRAVENOUS
  Filled 2015-07-30 (×2): qty 1

## 2015-07-30 MED ORDER — ONDANSETRON HCL 4 MG/2ML IJ SOLN: 4.0000 mg | Freq: Four times a day (QID) | INTRAMUSCULAR | Status: DC | PRN

## 2015-07-30 MED ORDER — ONDANSETRON HCL 4 MG PO TABS: 4.0000 mg | ORAL_TABLET | Freq: Four times a day (QID) | ORAL | Status: DC | PRN

## 2015-07-30 MED ORDER — POTASSIUM CHLORIDE IN NACL 20-0.9 MEQ/L-% IV SOLN
INTRAVENOUS | Status: DC
  Administered 2015-07-30 – 2015-07-31 (×2): via INTRAVENOUS
  Filled 2015-07-30 (×5): qty 1000

## 2015-07-30 MED ORDER — DEXTROSE 5 % IV SOLN
1.0000 g | Freq: Three times a day (TID) | INTRAVENOUS | Status: DC
  Filled 2015-07-30 (×3): qty 1

## 2015-07-30 MED ORDER — LORAZEPAM 2 MG/ML IJ SOLN
0.5000 mg | Freq: Four times a day (QID) | INTRAMUSCULAR | Status: DC | PRN
  Administered 2015-07-31: 0.5 mg via INTRAVENOUS
  Filled 2015-07-30: qty 1

## 2015-07-30 MED ORDER — SODIUM CHLORIDE 0.9 % IV SOLN: INTRAVENOUS | Status: DC

## 2015-07-30 MED ORDER — SODIUM CHLORIDE 0.9 % IV BOLUS (SEPSIS)
1000.0000 mL | Freq: Once | INTRAVENOUS | Status: AC
  Administered 2015-07-30: 1000 mL via INTRAVENOUS

## 2015-07-30 MED ORDER — ENSURE ENLIVE PO LIQD
237.0000 mL | Freq: Two times a day (BID) | ORAL | Status: DC
  Administered 2015-07-31 – 2015-08-02 (×5): 237 mL via ORAL

## 2015-07-30 NOTE — Progress Notes (Signed)
ANTIBIOTIC CONSULT NOTE - INITIAL  Pharmacy Consult for aztreonam Indication: HAP  Allergies  Allergen Reactions  . Codeine Hives  . Augmentin [Amoxicillin-Pot Clavulanate] Diarrhea and Nausea And Vomiting  . Penicillins Hives    Has patient had a PCN reaction causing immediate rash, facial/tongue/throat swelling, SOB or lightheadedness with hypotension: No Has patient had a PCN reaction causing severe rash involving mucus membranes or skin necrosis: Hives  Has patient had a PCN reaction that required hospitalization: No Has patient had a PCN reaction occurring within the last 10 years: No     Patient Measurements: weight 81 kg, hight 71 inches   Vital Signs: Temp: 101.7 F (38.7 C) (01/29 1858) BP: 157/80 mmHg (01/29 1858) Pulse Rate: 86 (01/29 1858) Intake/Output from previous day:   Intake/Output from this shift:    Labs:  Recent Labs  07/30/15 1911  WBC 15.6*  HGB 11.5*  PLT 444*   Estimated Creatinine Clearance: 51 mL/min (by C-G formula based on Cr of 0.95). No results for input(s): VANCOTROUGH, VANCOPEAK, VANCORANDOM, GENTTROUGH, GENTPEAK, GENTRANDOM, TOBRATROUGH, TOBRAPEAK, TOBRARND, AMIKACINPEAK, AMIKACINTROU, AMIKACIN in the last 72 hours.   Microbiology: No results found for this or any previous visit (from the past 720 hour(s)).  Medical History: Past Medical History  Diagnosis Date  . Hypertension   . Colitis   . Coronary artery disease   . High cholesterol     takes Red Yeast Rice and Fish OIl daily  . Kidney stones   . PONV (postoperative nausea and vomiting)   . COPD (chronic obstructive pulmonary disease) (HCC)     pleurisy or COPD exascerbation > 64yrago  . Headache(784.0)   . Vertigo     HTN related bc gets up too fast  . Arthritis   . Bruises easily   . History of shingles 181yrago    in eye;occasionally gets some residual   . Accessory skin tags     arms/legs  . GERD (gastroesophageal reflux disease)   . Colitis   .  Diverticulosis   . Urinary frequency   . Stress incontinence   . History of kidney stones   . Hypothyroidism     takes sYnthroid daily  . Cataract     early stage on right  . Depression     some but takes Amitryptylline nightly  . Right-sided chest wall pain 08/28/12    PRESENTATION - RIGHT SIDED ANTERIOR CHEST PAIN  . Weight loss 08/28/12    10 LB WEIGHT LOSS OVER 3 MONTHS  . Lung mass 08/12/12    CHEST-XRAY/ PET -LOBULAR MASS LLL - 4.1 X 3.6 X 4.2 CM  . S/P radiation therapy  10/05/2012-11/20/2012    Left Lower Lung and hilum / 70 Gy in 35 fractions  . Hypertension   . At risk for fall due to comorbid condition     5 falls - last Early December  . Vertigo   . Orthostatic hypotension   . Adenocarcinoma of lung ([facility]14    needle core bx-LLL-adenocarcinoma  . MI [facility] infarction) ([facility]  . Stroke ([facility]    mild 2014, no deficits  . Shortness of breath     with exertion  . Pneumonia     hx of 20+yrs ago  . Macular degeneration, dry     left eye  . Avascular necrosis of bone of left hip ([facility]  . Skin tear of hand without complication     RIGHT HAND FOR LAST 10 DAYS  USRING DRESSING AND NEOSPORIN TO    Assessment: Patient's an 82 y.o F with recent L hip fx and s/p THA on 1/24.  She was discharged from the hospital on 1/26 and presented to the ED on 1/29 with c/o AMS.  To start aztreonam for suspected PNA.  - wbc 15.6, scr 0.91 (crcl~61) - 1/29 CXR: New opacity in left retrocardiac lung base, suspicious for pneumonia  1/29 ucx:  Plan:  - aztreonam 1gm IV q8h  Maelyn Berrey P 07/30/2015,7:51 PM

## 2015-07-30 NOTE — ED Notes (Signed)
Patient transported to CT 

## 2015-07-30 NOTE — ED Provider Notes (Addendum)
CSN: [id number]     Arrival date & time 07/30/15  1833 History   First MD Initiated Contact with Patient 07/30/15 1837     Chief Complaint  Patient presents with  . Altered Mental Status     (Consider location/radiation/quality/duration/timing/severity/associated sxs/prior Treatment) The history is provided by a relative.  Jodi Frazier is a 82 y.o. female hx of HTN, CAD, UTI, recent L hip fracture on 1/25 here with AMS. She had left hip replacement with Dr. Alvan Dame 4 days ago.  She was discharged 3 days ago and was home with her family. The daughter is a Marine scientist working in PACU. She noticed that patient is very sensitive to pain medicine but she seemed to be in pain so gave her Norco and half a xanaflex around 3 pm. Patient then took a nap and then woke up very confused. Per the daughter, she became very disoriented and didn't know where she was. They also measure a temperature that was 100.4 F at home. Denies coughing or vomiting or diarrhea. Patient restarted on plavix and is not on any other anticoagulants   Level V caveat- AMS    Past Medical History  Diagnosis Date  . Hypertension   . Colitis   . Coronary artery disease   . High cholesterol     takes Red Yeast Rice and Fish OIl daily  . Kidney stones   . PONV (postoperative nausea and vomiting)   . COPD (chronic obstructive pulmonary disease) (HCC)     pleurisy or COPD exascerbation > 3yrago  . Headache(784.0)   . Vertigo     HTN related bc gets up too fast  . Arthritis   . Bruises easily   . History of shingles 166yrago    in eye;occasionally gets some residual   . Accessory skin tags     arms/legs  . GERD (gastroesophageal reflux disease)   . Colitis   . Diverticulosis   . Urinary frequency   . Stress incontinence   . History of kidney stones   . Hypothyroidism     takes sYnthroid daily  . Cataract     early stage on right  . Depression     some but takes Amitryptylline nightly  . Right-sided chest wall pain  08/28/12    PRESENTATION - RIGHT SIDED ANTERIOR CHEST PAIN  . Weight loss 08/28/12    10 LB WEIGHT LOSS OVER 3 MONTHS  . Lung mass 08/12/12    CHEST-XRAY/ PET -LOBULAR MASS LLL - 4.1 X 3.6 X 4.2 CM  . S/P radiation therapy  10/05/2012-11/20/2012    Left Lower Lung and hilum / 70 Gy in 35 fractions  . Hypertension   . At risk for fall due to comorbid condition     5 falls - last Early December  . Vertigo   . Orthostatic hypotension   . [place] [place]    needle core bx-LLL-adenocarcinoma  . [place]) [place]  . Stroke ([facility]    mild 2014, no deficits  . Shortness of breath     with exertion  . Pneumonia     hx of 20+yrs ago  . Macular degeneration, dry     left eye  . Avascular necrosis of bone of left hip ([facility] [place]  . Skin tear of hand without complication     RIGHT HAND FOR LAST 10 DAYS USRING DRESSING AND NEOSPORIN TO   Past Surgical History  Procedure Laterality Date  .  Hip arthroscopy  66yrago    right hip-replacement  . Knee arthroscopy      LT  . Total knee arthroplasty  89yrago    left  . Appendectomy  [id number]  . Lithotripsy  2011  . Cataract surgery  2012    left  . Eye surgery  [id number]  . Bladder surgery  2001    tacked  . Colonoscopy    . Esophagogastroduodenoscopy    . Total knee arthroplasty  09/09/2011    Procedure: TOTAL KNEE ARTHROPLASTY;  Surgeon: StepRudean Haskell;  [facility];  Laterality: Right;  Right Total Knee Arthroplasty  . Bronchial brush biopsy Left 08/17/12    LLL Bronchial Washing / Brushing and Bronchial Biopsy: Negative for malignancy  . Left lower lobe needle core biopsy Left 09/07/12    Poorly Differentiated Adenocarcinoma   . Joint replacement    . Stents to heart  2010    x 2 to right rca  . Coronary angioplasty with stent placement  2010    2 in rt coronary artery and 1 in another spot  . Abdominal hysterectomy  [id number]     complete  . Right knee replacement   2013  . Total hip arthroplasty Left 07/25/2015    Procedure: LEFT TOTAL HIP ARTHROPLASTY ANTERIOR APPROACH;  Surgeon: MatthParalee Cancel  Location: [facility];  Service: Orthopedics;  Laterality: Left;   Family History  Problem Relation Age of Onset  . Anesthesia problems Mother   . Hypotension Neg Hx   . Malignant hyperthermia Neg Hx   . Pseudochol deficiency Neg Hx   . Cancer Maternal Aunt     breast   Social History  Substance Use Topics  . Smoking status: Former Smoker -- 1.00 packs/day for 40 years    Types: Cigarettes    Quit date: 08/26/2002  . Smokeless tobacco: Never Used  . Alcohol Use: Yes     Comment: OCC   OB History    No data available     Review of Systems  Unable to perform ROS: Mental status change  All other systems reviewed and are negative.     Allergies  Codeine; Augmentin; and Penicillins  Home Medications   Prior to Admission medications   Medication Sig Start Date End Date Taking? Authorizing Provider  albuterol (PROVENTIL HFA;VENTOLIN HFA) 108 (90 Base) MCG/ACT inhaler Inhale 2 puffs into the lungs every 6 (six) hours as needed for wheezing. 07/05/15  Yes WilliBrunetta JeansC  ALPRAZolam (XANAX) 0.25 MG tablet TAKE 1 TABLET BY MOUTH TWICE DAILY AS NEEDED Patient taking differently: TAKE 0.125-.'25mg'$  TABLET BY MOUTH TWICE DAILY AS NEEDED for anxiety 06/27/15  Yes KatheMidge Minium amitriptyline (ELAVIL) 25 MG tablet Take 1 tablet (25 mg total) by mouth at bedtime. 06/09/15  Yes WilliBrunetta JeansC  amLODipine (NORVASC) 2.5 MG tablet TAKE 1 TABLET BY MOUTH DAILY 07/21/15  Yes WilliBrunetta JeansC  BIOTIN PO Take 1 tablet by mouth daily.    Yes Historical Provider, MD  carvedilol (COREG) 6.25 MG tablet TAKE 1 TABLET BY MOUTH 2 TIMES A DAY WITH A MEAL 11/01/14  Yes WilliBrunetta JeansC  celecoxib (CELEBREX) 100 MG capsule Take 1 capsule (100 mg total) by mouth 2 (two) times daily. Patient taking differently: Take 100 mg by mouth 2 (two) times  daily as needed for moderate pain.  06/09/15  Yes WilliBrunetta JeansC  clopidogrel (PLAVIX) 75 MG tablet Take 1 tablet (75 mg total) by mouth daily. 11/01/14  Yes Brunetta Jeans, PA-C  cyanocobalamin (,VITAMIN B-12,) 1000 MCG/ML injection Inject 1 mL (1,000 mcg total) into the muscle every 30 (thirty) days. Last dose was on 01-29-14 05/05/15  Yes Brunetta Jeans, PA-C  docusate sodium (COLACE) 100 MG capsule Take 1 capsule (100 mg total) by mouth 2 (two) times daily. Patient taking differently: Take 100 mg by mouth daily as needed for moderate constipation.  07/26/15  Yes Matthew Babish, PA-C  donepezil (ARICEPT) 10 MG tablet Take 1.5 tablets (15 mg total) by mouth at bedtime. Takes 1 and 1/2 05/15/15  Yes Brunetta Jeans, PA-C  escitalopram (LEXAPRO) 10 MG tablet TAKE 1 TABLET BY MOUTH DAILY 05/15/15  Yes Brunetta Jeans, PA-C  Fluticasone Furoate-Vilanterol (BREO ELLIPTA) 100-25 MCG/INH AEPB Use 1 puff daily as directed. Patient taking differently: Take 1 puff by mouth daily.  11/01/14  Yes Brunetta Jeans, PA-C  gabapentin (NEURONTIN) 100 MG capsule Take 1 capsule (100 mg total) by mouth 2 (two) times daily. Patient taking differently: Take 100 mg by mouth 2 (two) times daily as needed (Pain).  07/17/15  Yes Brunetta Jeans, PA-C  HYDROcodone-acetaminophen (NORCO) 7.5-325 MG tablet Take 1-2 tablets by mouth every 4 (four) hours as needed for moderate pain. Patient taking differently: Take 0.5-1 tablets by mouth every 4 (four) hours as needed for moderate pain.  07/26/15  Yes Danae Orleans, PA-C  levothyroxine (SYNTHROID) 125 MCG tablet TAKE 1 TABLET (125 MCG TOTAL) BY MOUTH DAILY BEFORE BREAKFAST. 06/09/15  Yes Brunetta Jeans, PA-C  lidocaine (LIDODERM) 5 % Place 1 patch onto the skin daily. Remove & Discard patch within 12 hours or as directed by MD 07/17/15  Yes Brunetta Jeans, PA-C  meclizine (ANTIVERT) 25 MG tablet Take 25 mg by mouth 3 (three) times daily as needed for dizziness.   Yes  Historical Provider, MD  mesalamine (LIALDA) 1.2 G EC tablet Take 1 tablet (1.2 g total) by mouth 3 (three) times daily. Patient taking differently: Take 1.2 g by mouth at bedtime.  11/01/14  Yes Brunetta Jeans, PA-C  mupirocin ointment (BACTROBAN) 2 % Place 1 application into the nose 2 (two) times daily as needed (irritation).    Yes Historical Provider, MD  Neomycin-Bacitracin-Polymyxin (NEOSPORIN EX) Apply 1 application topically daily as needed (skin tears). USING DAILY TO RIGHT HAND SKIN TEAR   Yes Historical Provider, MD  nitroGLYCERIN (NITROSTAT) 0.4 MG SL tablet Place 1 tablet (0.4 mg total) under the tongue every 5 (five) minutes as needed for chest pain. 11/01/14  Yes Brunetta Jeans, PA-C  ondansetron (ZOFRAN) 8 MG tablet Take 1 tablet (8 mg total) by mouth every 8 (eight) hours as needed for nausea or vomiting. 11/01/14  Yes Brunetta Jeans, PA-C  oxyCODONE (OXY IR/ROXICODONE) 5 MG immediate release tablet Take 2.5 mg by mouth every 4 (four) hours as needed. pain 07/11/15  Yes Historical Provider, MD  pantoprazole (PROTONIX) 40 MG tablet TAKE 1 TABLET BY MOUTH EVERY MORNING Patient taking differently: TAKE 1 TABLET BY MOUTH EVERY evening 07/21/15  Yes Brunetta Jeans, PA-C  polyethylene glycol (MIRALAX / GLYCOLAX) packet Take 17 g by mouth 2 (two) times daily. Patient taking differently: Take 17 g by mouth daily as needed for moderate constipation.  07/26/15  Yes Danae Orleans, PA-C  simvastatin (ZOCOR) 10 MG tablet Take 1 tablet (10 mg total) by mouth every other day. 05/12/15  Yes Brunetta Jeans, PA-C  tiZANidine (ZANAFLEX) 4 MG tablet Take 1 tablet (4 mg total) by mouth every 6 (six) hours as needed for muscle spasms. Patient taking differently: Take 2-4 mg by mouth every 6 (six) hours as needed for muscle spasms.  07/26/15  Yes Danae Orleans, PA-C  aspirin EC 81 MG EC tablet Take 1 tablet (81 mg total) by mouth daily. Patient not taking: Reported on 07/30/2015 07/26/15   Danae Orleans,  PA-C  ferrous sulfate 325 (65 FE) MG tablet Take 1 tablet (325 mg total) by mouth 3 (three) times daily after meals. Patient not taking: Reported on 07/30/2015 07/26/15   Danae Orleans, PA-C   BP 157/80 mmHg  Pulse 86  Temp(Src) 101.7 F (38.7 C)  Resp 22  SpO2 100% Physical Exam  Constitutional:  Confused   HENT:  Head: Normocephalic.  MM dry   Eyes: Conjunctivae are normal. Pupils are equal, round, and reactive to light.  Neck: Normal range of motion. Neck supple.  Cardiovascular: Normal rate, regular rhythm and normal heart sounds.   Pulmonary/Chest: Effort normal and breath sounds normal. No respiratory distress. She has no wheezes. She has no rales.  Abdominal: Soft. Bowel sounds are normal. She exhibits no distension. There is no tenderness. There is no rebound.  Musculoskeletal:  L hip surgical scar healing well with no obvious cellulitis or purulent discharge, able to passively range the hip.   Neurological:  Confused, A & O x 0, moving all extremities. Not following commands   Skin: Skin is warm and dry.  Psychiatric:  Unable   Nursing note and vitals reviewed.   ED Course  Procedures (including critical care time) Labs Review Labs Reviewed  CBC WITH DIFFERENTIAL/PLATELET - Abnormal; Notable for the following:    WBC 15.6 (*)    Hemoglobin 11.5 (*)    MCH 25.6 (*)    RDW 16.9 (*)    Platelets 444 (*)    Neutro Abs 12.7 (*)    Monocytes Absolute 1.2 (*)    All other components within normal limits  COMPREHENSIVE METABOLIC PANEL - Abnormal; Notable for the following:    Sodium 134 (*)    Potassium 3.4 (*)    Glucose, Bld 113 (*)    Calcium 8.8 (*)    Total Protein 6.3 (*)    Albumin 2.9 (*)    Alkaline Phosphatase 131 (*)    GFR calc non Af Amer 57 (*)    All other components within normal limits  CBG MONITORING, ED - Abnormal; Notable for the following:    Glucose-Capillary 116 (*)    All other components within normal limits  URINE CULTURE  CULTURE,  BLOOD (ROUTINE X 2)  CULTURE, BLOOD (ROUTINE X 2) W REFLEX TO ID PANEL  URINALYSIS, ROUTINE W REFLEX MICROSCOPIC (NOT AT [facility])  I-STAT TROPOININ, ED    Imaging Review Ct Head Wo Contrast  07/30/2015  CLINICAL DATA:  Sudden onset altered mental status today. EXAM: CT HEAD WITHOUT CONTRAST TECHNIQUE: Contiguous axial images were obtained from the base of the skull through the vertex without intravenous contrast. COMPARISON:  05/31/2015 FINDINGS: The brainstem, cerebellum, cerebral peduncles, thalami, basal ganglia, basilar cisterns, and ventricular system appear within normal limits. Periventricular white matter and corona radiata hypodensities favor chronic ischemic microvascular white matter disease. No intracranial hemorrhage, mass lesion, or acute CVA. Cerebral atrophy, age appropriate. There is atherosclerotic calcification of the cavernous carotid arteries bilaterally. There is potentially some type of plate along the right orbital  floor although this is obscured by motion artifact, correlate with operative history. IMPRESSION: 1. No acute intracranial findings. 2. Periventricular white matter and corona radiata hypodensities favor chronic ischemic microvascular white matter disease. Electronically Signed   By: Van Clines M.D.   On: 07/30/2015 19:44   Dg Chest Port 1 View  07/30/2015  CLINICAL DATA:  Altered mental status. Left lung carcinoma. Pneumoniaa. EXAM: PORTABLE CHEST 1 VIEW COMPARISON:  05/31/2015 FINDINGS: New opacity seen in the retrocardiac left lung base, suspicious for pneumonia. Left midlung scarring is stable in appearance. Nodular opacity in the right upper is unchanged. Heart size is stable. IMPRESSION: New opacity in left retrocardiac lung base, suspicious for pneumonia. Stable left perihilar scarring and right upper lobe nodular density. Electronically Signed   By: Earle Gell M.D.   On: 07/30/2015 19:38   I have personally reviewed and evaluated these images and lab  results as part of my medical decision-making.   EKG Interpretation None      MDM   Final diagnoses:  None   Jodi Frazier is a 82 y.o. female here with AMS, fever. Consider sepsis from UTI vs pneumonia. Also consider head bleed given that she is on plavix and had acute onset of AMS. No signs of surgical site infection. Will do sepsis workup, CT head.   7:54 PM CT head nl. WBC 15. CXR showed L lower pneumonia. Also has UTI. Cultures sent. Recently was in the hospital for hip surgery. Will admit for HCAP, UTI.    Wandra Arthurs, MD 07/30/15 2002  Wandra Arthurs, MD 07/30/15 2003

## 2015-07-30 NOTE — ED Notes (Signed)
Bed: WA04 Expected date: 07/30/15 Expected time: 6:35 PM Means of arrival: Ambulance Comments: AMS

## 2015-07-30 NOTE — ED Notes (Signed)
Pt was here on 1/25 for L hip replacement w/ Dr. Alvan Dame. Thought she had a UTI at that time but did not test her urine. Pt started having altered mental status this morning. Pt is taking norco and a muscle relaxer. Febrile. Alert.

## 2015-07-30 NOTE — ED Notes (Signed)
MD at bedside. Admitting  

## 2015-07-30 NOTE — ED Notes (Signed)
Med not available.

## 2015-07-30 NOTE — H&P (Signed)
[facility] History and Physical  ARMANDA FORAND EXB:[id number] DOB: 28-Feb-1931 DOA: 07/30/2015  Referring physician: Wandra Arthurs, MD PCP: Leeanne Rio, PA-C   Chief Complaint: Altered mental status.  HPI: Jodi Frazier is a 82 y.o. female with a past medical history of essential hypertension, CAD, hyperlipidemia, urolithiasis, COPD, GERD, anxiety, diverticulosis who is brought to the emergency department 4 days after left hip arthroplasty surgery by Dr. Alvan Dame with fever and altered mental status.   Per family members, while she was in the hospital earlier this week, during her left hip arthroplasty, she felt like she may have had an UTI. However, she went home and was doing well and did not have any significant symptoms until this morning. The patient woke up and was given Zanaflex and a while later became mildly confused. Patient subsequently during the day developed a fever and her confusion increased so she was brought to the emergency department via EMS.  Workup in the emergency department shows urinalysis with pyuria and leukocytosis consistent with UTI. The patient was unable to provide history since she was febrile, delirious and have been given lorazepam earlier.   Review of Systems:  Unable to review due to the patient's mental status.  Past Medical History  Diagnosis Date  . Hypertension   . Colitis   . Coronary artery disease   . High cholesterol     takes Red Yeast Rice and Fish OIl daily  . Kidney stones   . PONV (postoperative nausea and vomiting)   . COPD (chronic obstructive pulmonary disease) (HCC)     pleurisy or COPD exascerbation > 69yrago  . Headache(784.0)   . Vertigo     HTN related bc gets up too fast  . Arthritis   . Bruises easily   . History of shingles 193yrago    in eye;occasionally gets some residual   . Accessory skin tags     arms/legs  . GERD (gastroesophageal reflux disease)   . Colitis   . Diverticulosis   . Urinary  frequency   . Stress incontinence   . History of kidney stones   . Hypothyroidism     takes sYnthroid daily  . Cataract     early stage on right  . Depression     some but takes Amitryptylline nightly  . Right-sided chest wall pain 08/28/12    PRESENTATION - RIGHT SIDED ANTERIOR CHEST PAIN  . Weight loss 08/28/12    10 LB WEIGHT LOSS OVER 3 MONTHS  . Lung mass 08/12/12    CHEST-XRAY/ PET -LOBULAR MASS LLL - 4.1 X 3.6 X 4.2 CM  . S/P radiation therapy  10/05/2012-11/20/2012    Left Lower Lung and hilum / 70 Gy in 35 fractions  . Hypertension   . At risk for fall due to comorbid condition     5 falls - last Early December  . Vertigo   . Orthostatic hypotension   . Adenocarcinoma of lung ([facility]/[place]    needle core bx-LLL-adenocarcinoma  . [place]  . [place]    mild 2014, no deficits  . Shortness of breath     with exertion  . Pneumonia     hx of 20+yrs ago  . Macular degeneration, dry     left eye  . Avascular necrosis of bone of left hip ([facility]  . Skin tear of hand without complication     RIGHT HAND FOR LAST 10  DAYS USRING DRESSING AND NEOSPORIN TO   Past Surgical History  Procedure Laterality Date  . Hip arthroscopy  40yrago    right hip-replacement  . Knee arthroscopy      LT  . Total knee arthroplasty  154yrago    left  . Appendectomy  [id number]  . Lithotripsy  2011  . Cataract surgery  2012    left  . Eye surgery  [id number]  . Bladder surgery  2001    tacked  . Colonoscopy    . Esophagogastroduodenoscopy    . Total knee arthroplasty  09/09/2011    Procedure: TOTAL KNEE ARTHROPLASTY;  Surgeon: StepRudean Haskell;  [facility];  Laterality: Right;  Right Total Knee Arthroplasty  . Bronchial brush biopsy Left 08/17/12    LLL Bronchial Washing / Brushing and Bronchial Biopsy: Negative for malignancy  . Left lower lobe needle core biopsy Left 09/07/12    Poorly Differentiated Adenocarcinoma   . Joint  replacement    . Stents to heart  2010    x 2 to right rca  . Coronary angioplasty with stent placement  2010    2 in rt coronary artery and 1 in another spot  . Abdominal hysterectomy  [id number]     complete  . Right knee replacement  2013  . Total hip arthroplasty Left 07/25/2015    Procedure: LEFT TOTAL HIP ARTHROPLASTY ANTERIOR APPROACH;  Surgeon: MatthParalee Cancel  Location: [facility];  Service: Orthopedics;  Laterality: Left;   Social History:  reports that she quit smoking about 12 years ago. Her smoking use included Cigarettes. She has a 40 pack-year smoking history. She has never used smokeless tobacco. She reports that she drinks alcohol. She reports that she does not use illicit drugs.  Allergies  Allergen Reactions  . Codeine Hives  . Augmentin [Amoxicillin-Pot Clavulanate] Diarrhea and Nausea And Vomiting  . Penicillins Hives    Has patient had a PCN reaction causing immediate rash, facial/tongue/throat swelling, SOB or lightheadedness with hypotension: No Has patient had a PCN reaction causing severe rash involving mucus membranes or skin necrosis: Hives  Has patient had a PCN reaction that required hospitalization: No Has patient had a PCN reaction occurring within the last 10 years: No     Family History  Problem Relation Age of Onset  . Anesthesia problems Mother   . Hypotension Neg Hx   . Malignant hyperthermia Neg Hx   . Pseudochol deficiency Neg Hx   . Cancer Maternal Aunt     breast    Prior to Admission medications   Medication Sig Start Date End Date Taking? Authorizing Provider  albuterol (PROVENTIL HFA;VENTOLIN HFA) 108 (90 Base) MCG/ACT inhaler Inhale 2 puffs into the lungs every 6 (six) hours as needed for wheezing. 07/05/15  Yes WilliBrunetta JeansC  ALPRAZolam (XANAX) 0.25 MG tablet TAKE 1 TABLET BY MOUTH TWICE DAILY AS NEEDED Patient taking differently: TAKE 0.125-.'25mg'$  TABLET BY MOUTH TWICE DAILY AS NEEDED for anxiety 06/27/15  Yes KatheMidge Minium  amitriptyline (ELAVIL) 25 MG tablet Take 1 tablet (25 mg total) by mouth at bedtime. 06/09/15  Yes WilliBrunetta JeansC  amLODipine (NORVASC) 2.5 MG tablet TAKE 1 TABLET BY MOUTH DAILY 07/21/15  Yes WilliBrunetta JeansC  BIOTIN PO Take 1 tablet by mouth daily.    Yes Historical Provider, MD  carvedilol (COREG) 6.25 MG tablet TAKE 1 TABLET BY MOUTH 2 TIMES  A DAY WITH A MEAL 11/01/14  Yes Brunetta Jeans, PA-C  celecoxib (CELEBREX) 100 MG capsule Take 1 capsule (100 mg total) by mouth 2 (two) times daily. Patient taking differently: Take 100 mg by mouth 2 (two) times daily as needed for moderate pain.  06/09/15  Yes Brunetta Jeans, PA-C  clopidogrel (PLAVIX) 75 MG tablet Take 1 tablet (75 mg total) by mouth daily. 11/01/14  Yes Brunetta Jeans, PA-C  cyanocobalamin (,VITAMIN B-12,) 1000 MCG/ML injection Inject 1 mL (1,000 mcg total) into the muscle every 30 (thirty) days. Last dose was on 01-29-14 05/05/15  Yes Brunetta Jeans, PA-C  docusate sodium (COLACE) 100 MG capsule Take 1 capsule (100 mg total) by mouth 2 (two) times daily. Patient taking differently: Take 100 mg by mouth daily as needed for moderate constipation.  07/26/15  Yes Matthew Babish, PA-C  donepezil (ARICEPT) 10 MG tablet Take 1.5 tablets (15 mg total) by mouth at bedtime. Takes 1 and 1/2 05/15/15  Yes Brunetta Jeans, PA-C  escitalopram (LEXAPRO) 10 MG tablet TAKE 1 TABLET BY MOUTH DAILY 05/15/15  Yes Brunetta Jeans, PA-C  Fluticasone Furoate-Vilanterol (BREO ELLIPTA) 100-25 MCG/INH AEPB Use 1 puff daily as directed. Patient taking differently: Take 1 puff by mouth daily.  11/01/14  Yes Brunetta Jeans, PA-C  gabapentin (NEURONTIN) 100 MG capsule Take 1 capsule (100 mg total) by mouth 2 (two) times daily. Patient taking differently: Take 100 mg by mouth 2 (two) times daily as needed (Pain).  07/17/15  Yes Brunetta Jeans, PA-C  HYDROcodone-acetaminophen (NORCO) 7.5-325 MG tablet Take 1-2 tablets by mouth every 4 (four) hours as  needed for moderate pain. Patient taking differently: Take 0.5-1 tablets by mouth every 4 (four) hours as needed for moderate pain.  07/26/15  Yes Danae Orleans, PA-C  levothyroxine (SYNTHROID) 125 MCG tablet TAKE 1 TABLET (125 MCG TOTAL) BY MOUTH DAILY BEFORE BREAKFAST. 06/09/15  Yes Brunetta Jeans, PA-C  lidocaine (LIDODERM) 5 % Place 1 patch onto the skin daily. Remove & Discard patch within 12 hours or as directed by MD 07/17/15  Yes Brunetta Jeans, PA-C  meclizine (ANTIVERT) 25 MG tablet Take 25 mg by mouth 3 (three) times daily as needed for dizziness.   Yes Historical Provider, MD  mesalamine (LIALDA) 1.2 G EC tablet Take 1 tablet (1.2 g total) by mouth 3 (three) times daily. Patient taking differently: Take 1.2 g by mouth at bedtime.  11/01/14  Yes Brunetta Jeans, PA-C  mupirocin ointment (BACTROBAN) 2 % Place 1 application into the nose 2 (two) times daily as needed (irritation).    Yes Historical Provider, MD  Neomycin-Bacitracin-Polymyxin (NEOSPORIN EX) Apply 1 application topically daily as needed (skin tears). USING DAILY TO RIGHT HAND SKIN TEAR   Yes Historical Provider, MD  nitroGLYCERIN (NITROSTAT) 0.4 MG SL tablet Place 1 tablet (0.4 mg total) under the tongue every 5 (five) minutes as needed for chest pain. 11/01/14  Yes Brunetta Jeans, PA-C  ondansetron (ZOFRAN) 8 MG tablet Take 1 tablet (8 mg total) by mouth every 8 (eight) hours as needed for nausea or vomiting. 11/01/14  Yes Brunetta Jeans, PA-C  oxyCODONE (OXY IR/ROXICODONE) 5 MG immediate release tablet Take 2.5 mg by mouth every 4 (four) hours as needed. pain 07/11/15  Yes Historical Provider, MD  pantoprazole (PROTONIX) 40 MG tablet TAKE 1 TABLET BY MOUTH EVERY MORNING Patient taking differently: TAKE 1 TABLET BY MOUTH EVERY evening 07/21/15  Yes Brunetta Jeans,  PA-C  polyethylene glycol (MIRALAX / GLYCOLAX) packet Take 17 g by mouth 2 (two) times daily. Patient taking differently: Take 17 g by mouth daily as needed for  moderate constipation.  07/26/15  Yes Danae Orleans, PA-C  simvastatin (ZOCOR) 10 MG tablet Take 1 tablet (10 mg total) by mouth every other day. 05/12/15  Yes Brunetta Jeans, PA-C  tiZANidine (ZANAFLEX) 4 MG tablet Take 1 tablet (4 mg total) by mouth every 6 (six) hours as needed for muscle spasms. Patient taking differently: Take 2-4 mg by mouth every 6 (six) hours as needed for muscle spasms.  07/26/15  Yes Danae Orleans, PA-C  aspirin EC 81 MG EC tablet Take 1 tablet (81 mg total) by mouth daily. Patient not taking: Reported on 07/30/2015 07/26/15   Danae Orleans, PA-C  ferrous sulfate 325 (65 FE) MG tablet Take 1 tablet (325 mg total) by mouth 3 (three) times daily after meals. Patient not taking: Reported on 07/30/2015 07/26/15   Danae Orleans, PA-C   Physical Exam: Filed Vitals:   07/30/15 1858 07/30/15 1940 07/30/15 2000 07/30/15 2030  BP: 157/80 159/71 150/80 147/82  Pulse: 86 80 82 78  Temp: 101.7 F (38.7 C)     Resp: '22 27 25 21  '$ SpO2: 100% 98% 98% 100%    Wt Readings from Last 3 Encounters:  07/25/15 81.222 kg (179 lb 1 oz)  07/11/15 82.101 kg (181 lb)  06/09/15 84.823 kg (187 lb)    General:  Febrile and delirious. Eyes: Positive anisocoria (left pupil is bigger than right. Baseline per family members),           normal lids, irises & conjunctiva ENT: grossly normal hearing, lips and oral mucosa mildly dry. Neck: no LAD, masses or thyromegaly Cardiovascular: RRR, no m/r/g. Trace LE edema. Telemetry: SR, no arrhythmias  Respiratory: Decreased breath sounds bilaterally, no w/r/r. No accessory muscle use. Abdomen: soft, ntnd Skin:  ecchymosis from previous peripheral catheterizations and blood draws on upper           extremities. Musculoskeletal: grossly normal tone BUE/BLE,                              left is surgical scar shows no erythema or discharge. Psychiatric: Confused and delirious. Neurologic: Confused, and at times delirious. Unable to evaluate.            Labs on Admission:  Basic Metabolic Panel:  Recent Labs Lab 07/25/15 1150 07/26/15 0410 07/27/15 0533 07/30/15 1911  NA 143 137 140 134*  K 4.1 4.9 4.4 3.4*  CL 108 108 109 101  CO2 '26 23 23 22  '$ GLUCOSE 104* 118* 125* 113*  BUN 15 17 22* 20  CREATININE 0.81 0.91 0.95 0.91  CALCIUM 10.1 9.4 9.8 8.8*   Liver Function Tests:  Recent Labs Lab 07/30/15 1911  AST 23  ALT 16  ALKPHOS 131*  BILITOT 0.8  PROT 6.3*  ALBUMIN 2.9*   CBC:  Recent Labs Lab 07/25/15 1150 07/26/15 0410 07/27/15 0533 07/30/15 1911  WBC 9.2 17.2* 14.7* 15.6*  NEUTROABS  --   --   --  12.7*  HGB 12.0 11.2* 10.6* 11.5*  HCT 39.3 36.8 35.0* 36.6  MCV 83.4 84.4 84.3 81.3  PLT 374 367 345 444*    BNP (last 3 results)  Recent Labs  12/04/14 1812  BNP 60.4    CBG:  Recent Labs Lab 07/30/15 1920  GLUCAP 116*  Radiological Exams on Admission: Ct Head Wo Contrast  07/30/2015  CLINICAL DATA:  Sudden onset altered mental status today. EXAM: CT HEAD WITHOUT CONTRAST TECHNIQUE: Contiguous axial images were obtained from the base of the skull through the vertex without intravenous contrast. COMPARISON:  05/31/2015 FINDINGS: The brainstem, cerebellum, cerebral peduncles, thalami, basal ganglia, basilar cisterns, and ventricular system appear within normal limits. Periventricular white matter and corona radiata hypodensities favor chronic ischemic microvascular white matter disease. No intracranial hemorrhage, mass lesion, or acute CVA. Cerebral atrophy, age appropriate. There is atherosclerotic calcification of the cavernous carotid arteries bilaterally. There is potentially some type of plate along the right orbital floor although this is obscured by motion artifact, correlate with operative history. IMPRESSION: 1. No acute intracranial findings. 2. Periventricular white matter and corona radiata hypodensities favor chronic ischemic microvascular white matter disease. Electronically Signed   By:  Van Clines M.D.   On: 07/30/2015 19:44   Dg Chest Port 1 View  07/30/2015  CLINICAL DATA:  Altered mental status. Left lung carcinoma. Pneumoniaa. EXAM: PORTABLE CHEST 1 VIEW COMPARISON:  05/31/2015 FINDINGS: New opacity seen in the retrocardiac left lung base, suspicious for pneumonia. Left midlung scarring is stable in appearance. Nodular opacity in the right upper is unchanged. Heart size is stable. IMPRESSION: New opacity in left retrocardiac lung base, suspicious for pneumonia. Stable left perihilar scarring and right upper lobe nodular density. Electronically Signed   By: Earle Gell M.D.   On: 07/30/2015 19:38    EKG: Independently reviewed. Vent. rate 81 BPM PR interval 154 ms QRS duration 85 ms QT/QTc 391/454 ms P-R-T axes 63 43 72 Sinus rhythm  Assessment/Plan Principal Problem:   Sepsis secondary to UTI ([place]) Admit to a stepdown. Continue gentle hydration. Continue supplemental oxygen. Follow-up blood cultures and sensitivity. Follow-up urine culture and sensitivity. Continue IV antibiotics, per her daughter York Cerise, she does not remember the patient being allergic to penicillin. In the past, she has been given Keflex for UTIs without any major side effect. I will continue vancomycin and insisted she was treated for ceftriaxone.  Active Problems:   Anxiety Lorazepam 0.5 mg IVP as needed.    COPD (chronic obstructive pulmonary disease) (HCC) Stable. Continue supplemental oxygen. Continue maintenance bronchodilators and inhaled steroids. Continue rescue bronchodilators as needed.    HTN (hypertension) Resume amlodipine 2.5 mg by mouth daily once the patient is more alert Resume carvedilol 6.25 mg by mouth twice a day once the patient is more alert.Marland Kitchen    GERD (gastroesophageal reflux disease) Pantoprazole 40 mg IVPB every 24 hours      Hyperlipidemia Continue simvastatin 10 mg by mouth every other day once the patient is more alert to swallow.    S/P left total  hip arthroplasty Continue acetaminophen as needed. Toradol 50 mg IVP every 8 hours when necessary.    Code Status: Full code. DVT Prophylaxis: SCDs Family Communication: Her daughters York Cerise and Neytiri were present in the room. Disposition Plan: Admit to stepdown for IV antibiotic therapy and close monitoring.  Time spent: Over 70 minutes were spent in the process of this admission.  Reubin Milan, MD [facility] Pager [phone].

## 2015-07-31 ENCOUNTER — Other Ambulatory Visit: Payer: Self-pay | Admitting: Physician Assistant

## 2015-07-31 ENCOUNTER — Encounter (HOSPITAL_COMMUNITY): Payer: Self-pay | Admitting: *Deleted

## 2015-07-31 ENCOUNTER — Encounter: Payer: Self-pay | Admitting: Emergency Medicine

## 2015-07-31 DIAGNOSIS — N390 Urinary tract infection, site not specified: Secondary | ICD-10-CM

## 2015-07-31 DIAGNOSIS — J181 Lobar pneumonia, unspecified organism: Secondary | ICD-10-CM

## 2015-07-31 DIAGNOSIS — R938 Abnormal findings on diagnostic imaging of other specified body structures: Secondary | ICD-10-CM

## 2015-07-31 DIAGNOSIS — G9340 Encephalopathy, unspecified: Secondary | ICD-10-CM

## 2015-07-31 DIAGNOSIS — J449 Chronic obstructive pulmonary disease, unspecified: Secondary | ICD-10-CM

## 2015-07-31 DIAGNOSIS — Z9660 Presence of unspecified orthopedic joint implant: Secondary | ICD-10-CM

## 2015-07-31 DIAGNOSIS — I10 Essential (primary) hypertension: Secondary | ICD-10-CM

## 2015-07-31 LAB — PROCALCITONIN: Procalcitonin: <0.1 ng/mL

## 2015-07-31 LAB — CBC WITH DIFFERENTIAL/PLATELET
Basophils Absolute: 0.1 10*3/uL (ref 0.0–0.1)
Basophils Relative: 0 %
EOS ABS: 0.1 10*3/uL (ref 0.0–0.7)
Eosinophils Relative: 1 %
HEMATOCRIT: 33.3 % — AB (ref 36.0–46.0)
HEMOGLOBIN: 10.7 g/dL — AB (ref 12.0–15.0)
LYMPHS ABS: 1.6 10*3/uL (ref 0.7–4.0)
Lymphocytes Relative: 13 %
MCH: 26 pg (ref 26.0–34.0)
MCHC: 32.1 g/dL (ref 30.0–36.0)
MCV: 81 fL (ref 78.0–100.0)
MONO ABS: 1.4 10*3/uL — AB (ref 0.1–1.0)
MONOS PCT: 11 %
NEUTROS ABS: 9.4 10*3/uL — AB (ref 1.7–7.7)
NEUTROS PCT: 75 %
Platelets: 375 10*3/uL (ref 150–400)
RBC: 4.11 MIL/uL (ref 3.87–5.11)
RDW: 17.1 % — AB (ref 11.5–15.5)
WBC: 12.6 10*3/uL — ABNORMAL HIGH (ref 4.0–10.5)

## 2015-07-31 LAB — MRSA PCR SCREENING: MRSA by PCR: NEGATIVE

## 2015-07-31 LAB — MAGNESIUM: Magnesium: 1.8 mg/dL (ref 1.7–2.4)

## 2015-07-31 LAB — LACTIC ACID, PLASMA: Lactic Acid, Venous: 1.9 mmol/L (ref 0.5–2.0)

## 2015-07-31 MED ORDER — LORAZEPAM 2 MG/ML IJ SOLN: 0.5000 mg | INTRAMUSCULAR | Status: DC | PRN

## 2015-07-31 MED ORDER — HALOPERIDOL LACTATE 5 MG/ML IJ SOLN
5.0000 mg | Freq: Once | INTRAMUSCULAR | Status: AC
  Administered 2015-07-31: 5 mg via INTRAMUSCULAR

## 2015-07-31 MED ORDER — HYDRALAZINE HCL 20 MG/ML IJ SOLN
10.0000 mg | Freq: Four times a day (QID) | INTRAMUSCULAR | Status: DC | PRN
  Administered 2015-07-31 – 2015-08-01 (×3): 10 mg via INTRAVENOUS
  Filled 2015-07-31 (×4): qty 1

## 2015-07-31 MED ORDER — AZITHROMYCIN 250 MG PO TABS: 500.0000 mg | ORAL_TABLET | Freq: Every day | ORAL | Status: DC

## 2015-07-31 MED ORDER — MAGNESIUM SULFATE IN D5W 10-5 MG/ML-% IV SOLN
1.0000 g | Freq: Once | INTRAVENOUS | Status: AC
  Administered 2015-07-31: 1 g via INTRAVENOUS
  Filled 2015-07-31: qty 100

## 2015-07-31 MED ORDER — HYDRALAZINE HCL 20 MG/ML IJ SOLN
2.0000 mg | Freq: Once | INTRAMUSCULAR | Status: AC
  Administered 2015-07-31: 2 mg via INTRAVENOUS
  Filled 2015-07-31: qty 1

## 2015-07-31 MED ORDER — AMLODIPINE BESYLATE 5 MG PO TABS
2.5000 mg | ORAL_TABLET | Freq: Every day | ORAL | Status: DC
  Administered 2015-07-31 – 2015-08-02 (×3): 2.5 mg via ORAL
  Filled 2015-07-31 (×3): qty 1

## 2015-07-31 MED ORDER — ALBUTEROL SULFATE (2.5 MG/3ML) 0.083% IN NEBU
2.5000 mg | INHALATION_SOLUTION | RESPIRATORY_TRACT | Status: DC | PRN
Start: 2015-07-31 — End: 2015-08-02
  Administered 2015-08-01 (×2): 2.5 mg via RESPIRATORY_TRACT
  Filled 2015-07-31 (×2): qty 3

## 2015-07-31 MED ORDER — POTASSIUM CHLORIDE CRYS ER 20 MEQ PO TBCR
40.0000 meq | EXTENDED_RELEASE_TABLET | Freq: Once | ORAL | Status: AC
  Administered 2015-07-31: 40 meq via ORAL
  Filled 2015-07-31: qty 2

## 2015-07-31 MED ORDER — HYDROMORPHONE HCL 1 MG/ML IJ SOLN
0.5000 mg | INTRAMUSCULAR | Status: DC | PRN
  Administered 2015-07-31: 0.5 mg via INTRAVENOUS
  Filled 2015-07-31: qty 1

## 2015-07-31 MED ORDER — CARVEDILOL 6.25 MG PO TABS
6.2500 mg | ORAL_TABLET | Freq: Two times a day (BID) | ORAL | Status: DC
  Administered 2015-07-31 – 2015-08-02 (×4): 6.25 mg via ORAL
  Filled 2015-07-31 (×5): qty 1

## 2015-07-31 MED ORDER — DEXTROSE 5 % IV SOLN
500.0000 mg | INTRAVENOUS | Status: DC
  Administered 2015-07-31 – 2015-08-01 (×2): 500 mg via INTRAVENOUS
  Filled 2015-07-31 (×4): qty 500

## 2015-07-31 MED ORDER — KETOROLAC TROMETHAMINE 30 MG/ML IJ SOLN
30.0000 mg | Freq: Four times a day (QID) | INTRAMUSCULAR | Status: DC | PRN
  Administered 2015-07-31: 30 mg via INTRAVENOUS
  Filled 2015-07-31 (×3): qty 1

## 2015-07-31 MED ORDER — ENOXAPARIN SODIUM 40 MG/0.4ML ~~LOC~~ SOLN
40.0000 mg | SUBCUTANEOUS | Status: DC
  Administered 2015-07-31 – 2015-08-02 (×3): 40 mg via SUBCUTANEOUS
  Filled 2015-07-31 (×3): qty 0.4

## 2015-07-31 MED ORDER — MOMETASONE FURO-FORMOTEROL FUM 100-5 MCG/ACT IN AERO
2.0000 | INHALATION_SPRAY | Freq: Two times a day (BID) | RESPIRATORY_TRACT | Status: DC
  Administered 2015-07-31 – 2015-08-02 (×5): 2 via RESPIRATORY_TRACT
  Filled 2015-07-31 (×2): qty 8.8

## 2015-07-31 NOTE — Progress Notes (Addendum)
York Cerise notified about patient combative behavior and IV infiltration in right wrist area.  Pt was hitting, kicking, biting and pulling hair of staff. 5 mg haldol ordered by Dr Clementeen Graham and given IM in left deltoid. York Cerise gave permission for placement of green mitts and/or restraints. Pt assisted back to bed after 45 minutes of aggressive behavior. Report given to Cristie Hem, RN and all questions answered.

## 2015-07-31 NOTE — Progress Notes (Signed)
Initial Nutrition Assessment  INTERVENTION:   Continue Ensure Enlive po BID, each supplement provides 350 kcal and 20 grams of protein RD to continue to monitor for nutritional needs  NUTRITION DIAGNOSIS:   Increased nutrient needs related to other (see comment) (healing from recent hip surgery) as evidenced by estimated needs.  GOAL:   Patient will meet greater than or equal to 90% of their needs  MONITOR:   PO intake, Supplement acceptance, Labs, Weight trends, I & O's  REASON FOR ASSESSMENT:   Malnutrition Screening Tool    ASSESSMENT:   82 year old female with history of hypertension, coronary artery disease, urolithiasis, COPD (see Dr. Lamonte Sakai), GERD, anxiety, diverticulosis, adenocarcinoma of the left lung diagnosed in 2014 status post radiation (refused chemotherapy), history of stroke, MI who recently underwent total left hip arthroplasty on 1/25 and was discharged home presented to the ED with fever and acute encephalopathy. Daughter reports the patient is very sensitive to opiates and benzodiazepines. Patient received Zanaflex at home on the day of admission for her hip spasm. Subsequently became confused. During the day she had a fever and her confusion worsened.  Pt was unable to provide history d/t confusion. RN in room, states that she has drank an Ensure supplement and ate some pudding today. Will continue to monitor intakes of meals and supplements.  Per weight history, weight has remained stable.  No signs of fat or muscle depletion.  Labs reviewed: Low Na, K  Diet Order:  DIET SOFT Room service appropriate?: Yes; Fluid consistency:: Thin  Skin:  Reviewed, no issues  Last BM:  1/29  Height:   Ht Readings from Last 1 Encounters:  07/30/15 '5\' 11"'$  (1.803 m)    Weight:   Wt Readings from Last 1 Encounters:  07/31/15 181 lb (82.1 kg)    Ideal Body Weight:  70.5 kg  BMI:  Body mass index is 25.26 kg/(m^2).  Estimated Nutritional Needs:   Kcal:   1900-2100  Protein:  95-105g  Fluid:  2L/day  EDUCATION NEEDS:   No education needs identified at this time  Clayton Bibles, MS, RD, LDN Pager: [phone] After Hours Pager: [phone]

## 2015-07-31 NOTE — Discharge Summary (Signed)
Physician Discharge Summary  Patient ID: Jodi Frazier MRN: [id number] DOB/AGE: 12-19-1930 82 y.o.  Admit date: 07/25/2015 Discharge date: 07/27/2015   Procedures:  Procedure(s) (LRB): LEFT TOTAL HIP ARTHROPLASTY ANTERIOR APPROACH (Left)  Attending Physician:  Dr. Paralee Cancel   Admission Diagnoses:   Left hip AVN / pain  Discharge Diagnoses:  Principal Problem:   S/P left THA, AA  Past Medical History  Diagnosis Date  . Hypertension   . Colitis   . Coronary artery disease   . High cholesterol     takes Red Yeast Rice and Fish OIl daily  . Kidney stones   . PONV (postoperative nausea and vomiting)   . COPD (chronic obstructive pulmonary disease) (HCC)     pleurisy or COPD exascerbation > 41yrago  . Headache(784.0)   . Vertigo     HTN related bc gets up too fast  . Arthritis   . Bruises easily   . History of shingles 142yrago    in eye;occasionally gets some residual   . Accessory skin tags     arms/legs  . GERD (gastroesophageal reflux disease)   . Colitis   . Diverticulosis   . Urinary frequency   . Stress incontinence   . History of kidney stones   . Hypothyroidism     takes sYnthroid daily  . Cataract     early stage on right  . Depression     some but takes Amitryptylline nightly  . Right-sided chest wall pain 08/28/12    PRESENTATION - RIGHT SIDED ANTERIOR CHEST PAIN  . Weight loss 08/28/12    10 LB WEIGHT LOSS OVER 3 MONTHS  . Lung mass 08/12/12    CHEST-XRAY/ PET -LOBULAR MASS LLL - 4.1 X 3.6 X 4.2 CM  . S/P radiation therapy  10/05/2012-11/20/2012    Left Lower Lung and hilum / 70 Gy in 35 fractions  . Hypertension   . At risk for fall due to comorbid condition     5 falls - last Early December  . Vertigo   . Orthostatic hypotension   . Ad[place] of lung ([facility]    needle core bx-LLL-adenocarcinoma  . MI [place]ocardial infarction) ([facility]  . [place] ([facility]    mild 2014, no deficits  . Shortness of breath     with exertion  .  Pneumonia     hx of 20+yrs ago  . Macular degeneration, dry     left eye  . Avascular necrosis of bone of left hip ([facility]  . Skin tear of hand without complication     RIGHT HAND FOR LAST 10 DAYS USRING DRESSING AND NEOSPORIN TO    HPI:    Jodi Frazier, [age].o. female, has a history of pain and functional disability in the left hip(s) due to arthritis and AVN and patient has failed non-surgical conservative treatments for greater than 12 weeks to include NSAID's and/or analgesics, use of assistive devices and activity modification. Onset of symptoms was gradual starting 5+ months ago with gradually worsening course since that time.The patient noted no past surgery on the left hip(s). Patient currently rates pain in the left hip at 10 out of 10 with activity. Patient has night pain, worsening of pain with activity and weight bearing, trendelenberg gait, pain that interfers with activities of daily living and pain with passive range of motion. Patient has evidence of periarticular osteophytes and joint space narrowing by imaging studies. This condition presents safety issues  increasing the risk of falls. There is no current active infection. Risks, benefits and expectations were discussed with the patient. Risks including but not limited to the risk of anesthesia, blood clots, nerve damage, blood vessel damage, failure of the prosthesis, infection and up to and including death. Patient understand the risks, benefits and expectations and wishes to proceed with surgery.   PCP: Leeanne Rio, PA-C   Discharged Condition: good  Hospital Course:  Patient underwent the above stated procedure on 07/25/2015. Patient tolerated the procedure well and brought to the recovery room in good condition and subsequently to the floor.  POD #1 BP: 120/47 ; Pulse: 63 ; Temp: 97.8 F (36.6 C) ; Resp: 16 Patient reports pain as mild, pain controlled. Daughter is with her this AM. States that she got  really confused after receiving Dilaudid last night. This morning as the daughter and I are asking questions, she is getting more and more appropriate. No other events throughout the night. Dorsiflexion/plantar flexion intact, incision: dressing C/D/I, no cellulitis present and compartment soft.   LABS  Basename    HGB     11.2  HCT     36.8   POD #2  BP: 187/64 ; Pulse: 65 ; Temp: 98.1 F (36.7 C) ; Resp: 16 Patient reports pain as mild, pain appears to be controlled. The patient's daugther is with her and feels that a lot of the confusion the patient had the first day from the medicines has cleared. No events throughout the night. Ready to be discharged home, daughter states that she feel good about her mom going home and that she will be able to care for her. Dorsiflexion/plantar flexion intact, incision: dressing C/D/I, no cellulitis present and compartment soft.   LABS  Basename    HGB     10.6   HCT     35.0    Discharge Exam: General appearance: alert, cooperative and no distress Extremities: Homans sign is negative, no sign of DVT, no edema, redness or tenderness in the calves or thighs and no ulcers, gangrene or trophic changes  Disposition: Home with follow up in 2 weeks   Follow-up Information    Follow up with Mauri Pole, MD. Schedule an appointment as soon as possible for a visit in 2 weeks.   Specialty:  Orthopedic Surgery   Contact information:   [address] [place] [address] [phone]       Follow up with [facility].   Why:  home health physical therapy   Contact information:   [place] [address] [phone]       Discharge Instructions    Call MD / Call 911    Complete by:  As directed   If you experience chest pain or shortness of breath, CALL 911 and be transported to the hospital emergency room.  If you develope a fever above 101 F, pus (white drainage) or increased drainage or  redness at the wound, or calf pain, call your surgeon's office.     Change dressing    Complete by:  As directed   Maintain surgical dressing until follow up in the clinic. If the edges start to pull up, may reinforce with tape. If the dressing is no longer working, may remove and cover with gauze and tape, but must keep the area dry and clean.  Call with any questions or concerns.     Constipation Prevention    Complete by:  As directed   Drink plenty of fluids.  Prune juice may be helpful.  You may use a stool softener, such as Colace (over the counter) 100 mg twice a day.  Use MiraLax (over the counter) for constipation as needed.     Diet - low sodium heart healthy    Complete by:  As directed      Discharge instructions    Complete by:  As directed   Maintain surgical dressing until follow up in the clinic. If the edges start to pull up, may reinforce with tape. If the dressing is no longer working, may remove and cover with gauze and tape, but must keep the area dry and clean.  Follow up in 2 weeks at [facility]. Call with any questions or concerns.     Increase activity slowly as tolerated    Complete by:  As directed   Weight bearing as tolerated with assist device (walker, cane, etc) as directed, use it as long as suggested by your surgeon or therapist, typically at least 4-6 weeks.     TED hose    Complete by:  As directed   Use stockings (TED hose) for 2 weeks on both leg(s).  You may remove them at night for sleeping.             Medication List    STOP taking these medications        cycloSPORINE 0.05 % ophthalmic emulsion  Commonly known as:  RESTASIS     ibuprofen 200 MG tablet  Commonly known as:  ADVIL,MOTRIN     naproxen sodium 220 MG tablet  Commonly known as:  ANAPROX     oxyCODONE 5 MG immediate release tablet  Commonly known as:  Oxy IR/ROXICODONE      TAKE these medications        albuterol 108 (90 Base) MCG/ACT inhaler  Commonly known  as:  PROVENTIL HFA;VENTOLIN HFA  Inhale 2 puffs into the lungs every 6 (six) hours as needed for wheezing.     ALPRAZolam 0.25 MG tablet  Commonly known as:  XANAX  TAKE 1 TABLET BY MOUTH TWICE DAILY AS NEEDED     amitriptyline 25 MG tablet  Commonly known as:  ELAVIL  Take 1 tablet (25 mg total) by mouth at bedtime.     amLODipine 2.5 MG tablet  Commonly known as:  NORVASC  TAKE 1 TABLET BY MOUTH DAILY     aspirin 81 MG EC tablet  Take 1 tablet (81 mg total) by mouth daily.     BIOTIN PO  Take 1 tablet by mouth daily.     carvedilol 6.25 MG tablet  Commonly known as:  COREG  TAKE 1 TABLET BY MOUTH 2 TIMES A DAY WITH A MEAL     celecoxib 100 MG capsule  Commonly known as:  CELEBREX  Take 1 capsule (100 mg total) by mouth 2 (two) times daily.     clopidogrel 75 MG tablet  Commonly known as:  PLAVIX  Take 1 tablet (75 mg total) by mouth daily.     cyanocobalamin 1000 MCG/ML injection  Commonly known as:  (VITAMIN B-12)  Inject 1 mL (1,000 mcg total) into the muscle every 30 (thirty) days. Last dose was on 01-29-14     docusate sodium 100 MG capsule  Commonly known as:  COLACE  Take 1 capsule (100 mg total) by mouth 2 (two) times daily.     donepezil 10 MG tablet  Commonly known  as:  ARICEPT  Take 1.5 tablets (15 mg total) by mouth at bedtime. Takes 1 and 1/2     escitalopram 10 MG tablet  Commonly known as:  LEXAPRO  TAKE 1 TABLET BY MOUTH DAILY     ferrous sulfate 325 (65 FE) MG tablet  Take 1 tablet (325 mg total) by mouth 3 (three) times daily after meals.     Fluticasone Furoate-Vilanterol 100-25 MCG/INH Aepb  Commonly known as:  BREO ELLIPTA  Use 1 puff daily as directed.     gabapentin 100 MG capsule  Commonly known as:  NEURONTIN  Take 1 capsule (100 mg total) by mouth 2 (two) times daily.     HYDROcodone-acetaminophen 7.5-325 MG tablet  Commonly known as:  NORCO  Take 1-2 tablets by mouth every 4 (four) hours as needed for moderate pain.      levothyroxine 125 MCG tablet  Commonly known as:  SYNTHROID  TAKE 1 TABLET (125 MCG TOTAL) BY MOUTH DAILY BEFORE BREAKFAST.     lidocaine 5 %  Commonly known as:  LIDODERM  Place 1 patch onto the skin daily. Remove & Discard patch within 12 hours or as directed by MD     meclizine 25 MG tablet  Commonly known as:  ANTIVERT  Take 25 mg by mouth 3 (three) times daily as needed for dizziness.     mesalamine 1.2 g EC tablet  Commonly known as:  LIALDA  Take 1 tablet (1.2 g total) by mouth 3 (three) times daily.     mupirocin ointment 2 %  Commonly known as:  BACTROBAN  Place 1 application into the nose 2 (two) times daily as needed (irritation).     NEOSPORIN EX  Apply 1 application topically daily as needed (skin tears). USING DAILY TO RIGHT HAND SKIN TEAR     nitroGLYCERIN 0.4 MG SL tablet  Commonly known as:  NITROSTAT  Place 1 tablet (0.4 mg total) under the tongue every 5 (five) minutes as needed for chest pain.     ondansetron 8 MG tablet  Commonly known as:  ZOFRAN  Take 1 tablet (8 mg total) by mouth every 8 (eight) hours as needed for nausea or vomiting.     pantoprazole 40 MG tablet  Commonly known as:  PROTONIX  TAKE 1 TABLET BY MOUTH EVERY MORNING     polyethylene glycol packet  Commonly known as:  MIRALAX / GLYCOLAX  Take 17 g by mouth 2 (two) times daily.     simvastatin 10 MG tablet  Commonly known as:  ZOCOR  Take 1 tablet (10 mg total) by mouth every other day.     tiZANidine 4 MG tablet  Commonly known as:  ZANAFLEX  Take 1 tablet (4 mg total) by mouth every 6 (six) hours as needed for muscle spasms.         Signed: [place] Pugh. Zyon Rosser   PA-C  07/31/2015, 3:09 PM

## 2015-07-31 NOTE — Consult Note (Signed)
PULMONARY / CRITICAL CARE MEDICINE   Name: KAYTLAN BEHRMAN MRN: [id number] DOB: 04-21-1931    ADMISSION DATE:  07/30/2015 CONSULTATION DATE:  07/31/2015  REFERRING MD:  Dhungel  CHIEF COMPLAINT:  Confusion  HISTORY OF PRESENT ILLNESS:   82 y/o female with a history of COPD and lung cancer followed by Dr. Lamonte Sakai was admitted on 1/29 from a SNF after a hip arthroplasty last week.  She had confusion at her SNF and was found to have a UTI with fever. PCCM was consulted per family request on 1/30 for a question of pneumonia. The patient has been receiving IV fluids and ceftriaxone for her UTI overnight and is feeling better.  She denies cough, dyspnea, or chest pain.  She did note some dysuria associated with a foley catheter at her SNF a few days ago.    PAST MEDICAL HISTORY :  She  has a past medical history of Hypertension; Colitis; Coronary artery disease; High cholesterol; Kidney stones; PONV (postoperative nausea and vomiting); COPD (chronic obstructive pulmonary disease) ([place]); Headache(784.0); Vertigo; Arthritis; Bruises easily; History of shingles (68yr ago); Accessory skin tags; GERD (gastroesophageal reflux disease); Colitis; Diverticulosis; Urinary frequency; Stress incontinence; History of kidney stones; Hypothyroidism; Cataract; Depression; Right-sided chest wall pain (08/28/12); Weight loss (08/28/12); Lung mass (08/12/12); S/P radiation therapy ( 10/05/2012-11/20/2012); Hypertension; At risk for fall due to comorbid condition; Vertigo; Orthostatic hypotension; Adenocarcinoma of lung ([facility] (09/07[place]12); [place] ([facility]ocardial infarction) ([facility] ([place]); Stroke ([facility]; Shortness of breath; Pneumonia; Macular degeneration, dry; Avascular necrosis of bone of left hip ([facility]; and Skin tear of hand without complication.  PAST SURGICAL HISTORY: She  has past surgical history that includes Hip arthroscopy (89yrgo); Knee arthroscopy; Total knee arthroplasty ([age]yrgo); Appendectomy ([age]yro); Lithotripsy (2011);  cataract surgery (2012); Eye surgery (45yr58yr); Bladder surgery (2001); Colonoscopy; Esophagogastroduodenoscopy; Total knee arthroplasty (09/09/2011); Bronchial brush biopsy (Left, 08/17/12); LEFT LOWER LOBE NEEDLE CORE BIOPSY (Left, 09/07/12); Joint replacement; stents to heart (2010); Coronary angioplasty with stent (2010); Abdominal hysterectomy (29yrs10yr); right knee replacement (2013); and Total hip arthroplasty (Left, 07/25/2015).  Allergies  Allergen Reactions  . Codeine Hives  . Augmentin [Amoxicillin-Pot Clavulanate] Diarrhea and Nausea And Vomiting  . Penicillins Hives    Has patient had a PCN reaction causing immediate rash, facial/tongue/throat swelling, SOB or lightheadedness with hypotension: No Has patient had a PCN reaction causing severe rash involving mucus membranes or skin necrosis: Hives  Has patient had a PCN reaction that required hospitalization: No Has patient had a PCN reaction occurring within the last 10 years: No     No current facility-administered medications on file prior to encounter.   Current Outpatient Prescriptions on File Prior to Encounter  Medication Sig  . albuterol (PROVENTIL HFA;VENTOLIN HFA) 108 (90 Base) MCG/ACT inhaler Inhale 2 puffs into the lungs every 6 (six) hours as needed for wheezing.  . ALPRMarland KitchenZolam (XANAX) 0.25 MG tablet TAKE 1 TABLET BY MOUTH TWICE DAILY AS NEEDED (Patient taking differently: TAKE 0.125-.'25mg'$  TABLET BY MOUTH TWICE DAILY AS NEEDED for anxiety)  . amitriptyline (ELAVIL) 25 MG tablet Take 1 tablet (25 mg total) by mouth at bedtime.  . amLOMarland Kitchenipine (NORVASC) 2.5 MG tablet TAKE 1 TABLET BY MOUTH DAILY  . BIOTIN PO Take 1 tablet by mouth daily.   . carvedilol (COREG) 6.25 MG tablet TAKE 1 TABLET BY MOUTH 2 TIMES A DAY WITH A MEAL  . celecoxib (CELEBREX) 100 MG capsule Take 1 capsule (100 mg total) by mouth 2 (two) times daily. (Patient taking  differently: Take 100 mg by mouth 2 (two) times daily as needed for moderate pain. )  .  clopidogrel (PLAVIX) 75 MG tablet Take 1 tablet (75 mg total) by mouth daily.  . cyanocobalamin (,VITAMIN B-12,) 1000 MCG/ML injection Inject 1 mL (1,000 mcg total) into the muscle every 30 (thirty) days. Last dose was on 01-29-14  . docusate sodium (COLACE) 100 MG capsule Take 1 capsule (100 mg total) by mouth 2 (two) times daily. (Patient taking differently: Take 100 mg by mouth daily as needed for moderate constipation. )  . donepezil (ARICEPT) 10 MG tablet Take 1.5 tablets (15 mg total) by mouth at bedtime. Takes 1 and 1/2  . escitalopram (LEXAPRO) 10 MG tablet TAKE 1 TABLET BY MOUTH DAILY  . Fluticasone Furoate-Vilanterol (BREO ELLIPTA) 100-25 MCG/INH AEPB Use 1 puff daily as directed. (Patient taking differently: Take 1 puff by mouth daily. )  . gabapentin (NEURONTIN) 100 MG capsule Take 1 capsule (100 mg total) by mouth 2 (two) times daily. (Patient taking differently: Take 100 mg by mouth 2 (two) times daily as needed (Pain). )  . HYDROcodone-acetaminophen (NORCO) 7.5-325 MG tablet Take 1-2 tablets by mouth every 4 (four) hours as needed for moderate pain. (Patient taking differently: Take 0.5-1 tablets by mouth every 4 (four) hours as needed for moderate pain. )  . levothyroxine (SYNTHROID) 125 MCG tablet TAKE 1 TABLET (125 MCG TOTAL) BY MOUTH DAILY BEFORE BREAKFAST.  Marland Kitchen lidocaine (LIDODERM) 5 % Place 1 patch onto the skin daily. Remove & Discard patch within 12 hours or as directed by MD  . meclizine (ANTIVERT) 25 MG tablet Take 25 mg by mouth 3 (three) times daily as needed for dizziness.  . mesalamine (LIALDA) 1.2 G EC tablet Take 1 tablet (1.2 g total) by mouth 3 (three) times daily. (Patient taking differently: Take 1.2 g by mouth at bedtime. )  . mupirocin ointment (BACTROBAN) 2 % Place 1 application into the nose 2 (two) times daily as needed (irritation).   . Neomycin-Bacitracin-Polymyxin (NEOSPORIN EX) Apply 1 application topically daily as needed (skin tears). USING DAILY TO RIGHT HAND  SKIN TEAR  . nitroGLYCERIN (NITROSTAT) 0.4 MG SL tablet Place 1 tablet (0.4 mg total) under the tongue every 5 (five) minutes as needed for chest pain.  Marland Kitchen ondansetron (ZOFRAN) 8 MG tablet Take 1 tablet (8 mg total) by mouth every 8 (eight) hours as needed for nausea or vomiting.  . pantoprazole (PROTONIX) 40 MG tablet TAKE 1 TABLET BY MOUTH EVERY MORNING (Patient taking differently: TAKE 1 TABLET BY MOUTH EVERY evening)  . polyethylene glycol (MIRALAX / GLYCOLAX) packet Take 17 g by mouth 2 (two) times daily. (Patient taking differently: Take 17 g by mouth daily as needed for moderate constipation. )  . simvastatin (ZOCOR) 10 MG tablet Take 1 tablet (10 mg total) by mouth every other day.  Marland Kitchen tiZANidine (ZANAFLEX) 4 MG tablet Take 1 tablet (4 mg total) by mouth every 6 (six) hours as needed for muscle spasms. (Patient taking differently: Take 2-4 mg by mouth every 6 (six) hours as needed for muscle spasms. )  . aspirin EC 81 MG EC tablet Take 1 tablet (81 mg total) by mouth daily. (Patient not taking: Reported on 07/30/2015)  . ferrous sulfate 325 (65 FE) MG tablet Take 1 tablet (325 mg total) by mouth 3 (three) times daily after meals. (Patient not taking: Reported on 07/30/2015)    FAMILY HISTORYSOCIAL HISTORY/REVIEW OF SYSTEMS:   Cannot obtain due to confusion  SUBJECTIVE:  As above  VITAL SIGNS: BP 190/47 mmHg  Pulse 72  Temp(Src) 97.5 F (36.4 C) (Axillary)  Resp 24  Ht '5\' 11"'$  (1.803 m)  Wt 82.1 kg (181 lb)  BMI 25.26 kg/m2  SpO2 97%  HEMODYNAMICS:    VENTILATOR SETTINGS:    INTAKE / OUTPUT: I/O last 3 completed shifts: In: 658.3 [I.V.:608.3; IV Piggyback:50] Out: -   PHYSICAL EXAMINATION: General:  Sleepy, arouses to voice, no distress in bed Neuro:  Sleepy, arouses to voice HEENT:  NCAT, OP clear Cardiovascular:  RRR, no mgr Lungs:  CTA B, no crackles or rales on my exam, normal effort Abdomen:  BS+, soft, nontender Derm: no rash or skin breakdown Psyche: when awake  pleasant mood and affect  LABS:  BMET  Recent Labs Lab 07/26/15 0410 07/27/15 0533 07/30/15 1911  NA 137 140 134*  K 4.9 4.4 3.4*  CL 108 109 101  CO2 '23 23 22  '$ BUN 17 22* 20  CREATININE 0.91 0.95 0.91  GLUCOSE 118* 125* 113*    Electrolytes  Recent Labs Lab 07/26/15 0410 07/27/15 0533 07/30/15 1911  CALCIUM 9.4 9.8 8.8*    CBC  Recent Labs Lab 07/27/15 0533 07/30/15 1911 07/31/15 0351  WBC 14.7* 15.6* 12.6*  HGB 10.6* 11.5* 10.7*  HCT 35.0* 36.6 33.3*  PLT 345 444* 375    Coag's  Recent Labs Lab 07/25/15 1150  APTT 30  INR 1.14    Sepsis Markers  Recent Labs Lab 07/31/15 0830  LATICACIDVEN 1.9    ABG No results for input(s): PHART, PCO2ART, PO2ART in the last 168 hours.  Liver Enzymes  Recent Labs Lab 07/30/15 1911  AST 23  ALT 16  ALKPHOS 131*  BILITOT 0.8  ALBUMIN 2.9*    Cardiac Enzymes No results for input(s): TROPONINI, PROBNP in the last 168 hours.  Glucose  Recent Labs Lab 07/30/15 1920  GLUCAP 116*    Imaging  1/29 CXR images personally reviewed and compared to the older films, there appears to be enlargement of the left effusion and possibly atelectasis vs an infiltrate in the left base, but this is an AP film and all recent films are PA/Lat  STUDIES:    CULTURES: 1/29 blood > 1/29 urine >  ANTIBIOTICS: 1/29 ceftriaxone >  SIGNIFICANT EVENTS:   LINES/TUBES:   DISCUSSION: 82 y/o female with a history of recurrent UTI's admitted on 1/30 with fever and confusion likely due to UTI.  PCCM asked by family to consult on 1/30 specifically to answer the question: does she have pneumonia?  Based on her clear lung exam and lack of respiratory symptoms I don't think she has pneumonia, but agree the CXR findings are concerning.  Will need to repeat CXR PA/Lat for better imaging.  With baseline adenocarcinoma of the left lung will likely need outpatient f/u imaging as well.  ASSESSMENT /  PLAN:  PULMONARY A: Abnormal CXR> doubt pneumonia, suspect related to known left lung adenocarcinoma  Adenocarcinoma> s/p chemo/rad followed by Dr. Lamonte Sakai Left pleural effusion> sub acute, has been seen in imaging in 2016 COPD, not in exacerbation P:   Repeat CXR 2 view  Add Dulera when in hospital, then [facility] Add prn albuterol  INFECTIOUS A:   UTI> recent organisms include ecoli (essentially susceptible) and enterococcus P:   Would continue ceftriaxone for now F/u cultures  FAMILY  - Updates: Daughter ([facility] RN from PACU) updated bedside 1/30   Roselie Awkward, MD [place] PCCM Pager: [phone]  Cell: [phone] After 3pm or if no response, call [phone]  07/31/2015, 9:34 AM

## 2015-07-31 NOTE — Care Management Note (Signed)
Case Management Note  Patient Details  Name: Jodi Frazier MRN: [id number] Date of Birth: 1931-03-26  Subjective/Objective:            sepsis        Action/Plan: Date: July 31, 2015 Chart reviewed for concurrent status and case management needs. Will continue to follow patient for changes and needs: Velva Harman, BSN, RN, [place]   [phone]  Expected Discharge Date:                  Expected Discharge Plan:  Home/Self Care  In-House Referral:  NA  Discharge planning Services  CM Consult  Post Acute Care Choice:  NA Choice offered to:  NA  DME Arranged:    DME Agency:     HH Arranged:    [place] Agency:     Status of Service:  Completed, signed off  Medicare Important Message Given:    Date Medicare IM Given:    Medicare IM give by:    Date Additional Medicare IM Given:    Additional Medicare Important Message give by:     If discussed at [place] of Stay Meetings, dates discussed:    Additional Comments:  Leeroy Cha, RN 07/31/2015, 10:28 AM

## 2015-07-31 NOTE — Progress Notes (Signed)
[facility] PROGRESS NOTE  GENIENE LIST FKC:[id number] DOB: 1931/04/16 DOA: 07/30/2015 PCP: Leeanne Rio, PA-C Brief narrative 82 year old female with history of hypertension, coronary artery disease, urolithiasis, COPD (see Dr. Lamonte Sakai), GERD, anxiety, diverticulosis, adenocarcinoma of the left lung diagnosed in 2014 status post radiation (refused chemotherapy), history of stroke, MI who recently underwent total left hip arthroplasty on 1/25 and was discharged home presented to the ED with fever and acute encephalopathy. Daughter reports the patient is very sensitive to opiates and benzodiazepines. Patient received Zanaflex at home on the day of admission for her hip spasm. Subsequently became confused. During the day she had a fever and her confusion worsened. EMS was called and patient brought to the ED. In the ED she had a fever 101.66F, tachypnea and hypertensive. She was encephalopathic and agitated. Blood work showed WBC of 15.6, hemoglobin 11.5 and platelets of 444. Chemistry showed potassium of 3.4. Lactic acid was normal. Chest x-ray showed possible left lower lobe infiltrate and UA was positive for UTI. Patient met criteria for sepsis and was admitted to stepdown unit.   Assessment/Plan: Sepsis secondary to UTI and left lobar pneumonia Continue step down monitoring. Sepsis pathway not initiated on admission. IV hydration. Cultures sent on admission. On empiric Rocephin. Given her left lung infiltrate I have added IV azithromycin as well. -Mentation has improved this morning and patient better oriented although gets sleepy during communication.  Active problems Acute encephalopathy Secondary to sepsis and use of narcotics and Zanaflex at home. Patient is also on Xanax when necessary at home for anxiety, amitriptyline and Neurontin. I have held all his medications including Vicodin which was prescribed after the surgery. Mental status slowly improving. Continue with neuro  checks.  Uncontrolled hypertension Has elevated systolic blood pressure. I have resumed her home blood pressure medications and place her on when necessary IV hydralazine.  S/p Left hip arthroplasty on 1/25 Stable. When necessary IV Toradol for pain. Avoid narcotics. PT evaluation.  Protein calorie malnutrition Continue supplement.  COPD Stable. Continue albuterol inhaler. Follows with Dr. Lamonte Sakai  Coronary artery disease with history of MI and stroke Continue aspirin, Plavix, Coreg and statin.  Left lung adenocarcinoma Status post radiation therapy and history of radiation pneumonitis. Has chronic left pleural effusion and is one Breo..  ? Vascular dementia On Aricept.  Hypothyroidism Continue Synthroid  Diet: soft as tolerated  DVT prophylaxis: Add Lovenox. Was not discharged home on any DVT prophylaxis after surgery. Code Status: DO NOT RESUSCITATE Family Communication: Daughter at bedside Disposition Plan: Continue step down monitoring   Consultants:  None  Procedures:  Head CT  Antibiotics:  IV Rocephin 1/29  IV azithro 1/30--  HPI/Subjective: Seen and examined. Overnight was very confused requiring Haldol. Appears most oriented this morning and is able to recognize her daughter, to me about left hip surgery and thinks she is at was his [facility].  Objective: Filed Vitals:   07/31/15 0731 07/31/15 0947  BP: 190/47 135/46  Pulse: 72 71  Temp: 97.5 F (36.4 C)   Resp: 24 25    Intake/Output Summary (Last 24 hours) at 07/31/15 1046 Last data filed at 07/31/15 0849  Gross per 24 hour  Intake 958.33 ml  Output      0 ml  Net 958.33 ml   Filed Weights   07/30/15 2100 07/31/15 0647  Weight: 80.2 kg (176 lb 12.9 oz) 82.1 kg (181 lb)    Exam:   General:  Elderly female in no acute distress, appears confused  HEENT: No pallor, moist mucosa  Chest: Clear bilaterally  CVS: Normal S1 and S2, no murmurs rubs or gallop  GI: Soft, nondistended,  nontender  Musculoskeletal: Dressing over left hip appears clean, has good range of motion  CNS: AAO 1-2, nonfocal  Data Reviewed: Basic Metabolic Panel:  Recent Labs Lab 07/25/15 1150 07/26/15 0410 07/27/15 0533 07/30/15 1911 07/31/15 0830  NA 143 137 140 134*  --   K 4.1 4.9 4.4 3.4*  --   CL 108 108 109 101  --   CO2 _0 --   GLUCOSE 104* 118* 125* 113*  --   BUN 15 17 22* 20  --   CREATININE 0.81 0.91 0.95 0.91  --   CALCIUM 10.1 9.4 9.8 8.8*  --   MG  --   --   --   --  1.8   Liver Function Tests:  Recent Labs Lab 07/30/15 1911  AST 23  ALT 16  ALKPHOS 131*  BILITOT 0.8  PROT 6.3*  ALBUMIN 2.9*   No results for input(s): LIPASE, AMYLASE in the last 168 hours. No results for input(s): AMMONIA in the last 168 hours. CBC:  Recent Labs Lab 07/25/15 1150 07/26/15 0410 07/27/15 0533 07/30/15 1911 07/31/15 0351  WBC 9.2 17.2* 14.7* 15.6* 12.6*  NEUTROABS  --   --   --  12.7* 9.4*  HGB 12.0 11.2* 10.6* 11.5* 10.7*  HCT 39.3 36.8 35.0* 36.6 33.3*  MCV 83.4 84.4 84.3 81.3 81.0  PLT 374 367 345 444* 375   Cardiac Enzymes: No results for input(s): CKTOTAL, CKMB, CKMBINDEX, TROPONINI in the last 168 hours. BNP (last 3 results)  Recent Labs  12/04/14 1812  BNP 60.4    ProBNP (last 3 results) No results for input(s): PROBNP in the last 8760 hours.  CBG:  Recent Labs Lab 07/30/15 1920  GLUCAP 116*    Recent Results (from the past 240 hour(s))  MRSA PCR Screening     Status: None   Collection Time: 07/30/15 10:15 PM  Result Value Ref Range Status   MRSA by PCR NEGATIVE NEGATIVE Final    Comment:        The GeneXpert MRSA Assay (FDA approved for NASAL specimens only), is one component of a comprehensive MRSA colonization surveillance program. It is not intended to diagnose MRSA infection nor to guide or monitor treatment for MRSA infections.      Studies: Ct Head Wo Contrast  07/30/2015  CLINICAL DATA:  Sudden onset altered  mental status today. EXAM: CT HEAD WITHOUT CONTRAST TECHNIQUE: Contiguous axial images were obtained from the base of the skull through the vertex without intravenous contrast. COMPARISON:  05/31/2015 FINDINGS: The brainstem, cerebellum, cerebral peduncles, thalami, basal ganglia, basilar cisterns, and ventricular system appear within normal limits. Periventricular white matter and corona radiata hypodensities favor chronic ischemic microvascular white matter disease. No intracranial hemorrhage, mass lesion, or acute CVA. Cerebral atrophy, age appropriate. There is atherosclerotic calcification of the cavernous carotid arteries bilaterally. There is potentially some type of plate along the right orbital floor although this is obscured by motion artifact, correlate with operative history. IMPRESSION: 1. No acute intracranial findings. 2. Periventricular white matter and corona radiata hypodensities favor chronic ischemic microvascular white matter disease. Electronically Signed   By: Van Clines M.D.   On: 07/30/2015 19:44   Dg Chest Port 1 View  07/30/2015  CLINICAL DATA:  Altered mental status. Left lung carcinoma. Pneumoniaa. EXAM: PORTABLE CHEST 1 VIEW COMPARISON:  05/31/2015 FINDINGS: New opacity seen in the retrocardiac left lung base, suspicious for pneumonia. Left midlung scarring is stable in appearance. Nodular opacity in the right upper is unchanged. Heart size is stable. IMPRESSION: New opacity in left retrocardiac lung base, suspicious for pneumonia. Stable left perihilar scarring and right upper lobe nodular density. Electronically Signed   By: Earle Gell M.D.   On: 07/30/2015 19:38    Scheduled Meds: . amLODipine  2.5 mg Oral Daily  . azithromycin  500 mg Intravenous Q24H  . carvedilol  6.25 mg Oral BID WC  . cefTRIAXone (ROCEPHIN)  IV  1 g Intravenous Q24H  . feeding supplement (ENSURE ENLIVE)  237 mL Oral BID BM  . mometasone-formoterol  2 puff Inhalation BID  . pantoprazole  (PROTONIX) IV  40 mg Intravenous Q24H   Continuous Infusions: . 0.9 % NaCl with KCl 20 mEq / L 100 mL/hr at 07/30/15 2155      Time spent: 25 minutes    Aleyah Balik, [place]  [facility] Pager [phone]. If 7PM-7AM, please contact night-coverage at [email or web address], password [facility] 07/31/2015, 10:46 AM  LOS: 1 day

## 2015-08-01 ENCOUNTER — Inpatient Hospital Stay (HOSPITAL_COMMUNITY): Payer: Medicare Other

## 2015-08-01 DIAGNOSIS — J189 Pneumonia, unspecified organism: Secondary | ICD-10-CM

## 2015-08-01 LAB — CBC WITH DIFFERENTIAL/PLATELET
BASOS PCT: 0 %
Basophils Absolute: 0 10*3/uL (ref 0.0–0.1)
EOS ABS: 0.1 10*3/uL (ref 0.0–0.7)
Eosinophils Relative: 1 %
HCT: 30.9 % — ABNORMAL LOW (ref 36.0–46.0)
Hemoglobin: 9.9 g/dL — ABNORMAL LOW (ref 12.0–15.0)
Lymphocytes Relative: 10 %
Lymphs Abs: 1.3 10*3/uL (ref 0.7–4.0)
MCH: 26.3 pg (ref 26.0–34.0)
MCHC: 32 g/dL (ref 30.0–36.0)
MCV: 82 fL (ref 78.0–100.0)
MONO ABS: 1.1 10*3/uL — AB (ref 0.1–1.0)
MONOS PCT: 9 %
Neutro Abs: 10.3 10*3/uL — ABNORMAL HIGH (ref 1.7–7.7)
Neutrophils Relative %: 80 %
PLATELETS: 367 10*3/uL (ref 150–400)
RBC: 3.77 MIL/uL — ABNORMAL LOW (ref 3.87–5.11)
RDW: 17.4 % — AB (ref 11.5–15.5)
WBC: 12.9 10*3/uL — ABNORMAL HIGH (ref 4.0–10.5)

## 2015-08-01 LAB — BASIC METABOLIC PANEL
Anion gap: 8 (ref 5–15)
BUN: 13 mg/dL (ref 6–20)
CHLORIDE: 107 mmol/L (ref 101–111)
CO2: 22 mmol/L (ref 22–32)
Calcium: 8.8 mg/dL — ABNORMAL LOW (ref 8.9–10.3)
Creatinine, Ser: 0.7 mg/dL (ref 0.44–1.00)
GLUCOSE: 106 mg/dL — AB (ref 65–99)
POTASSIUM: 4 mmol/L (ref 3.5–5.1)
SODIUM: 137 mmol/L (ref 135–145)

## 2015-08-01 MED ORDER — ASPIRIN EC 81 MG PO TBEC
81.0000 mg | DELAYED_RELEASE_TABLET | Freq: Every day | ORAL | Status: DC
  Administered 2015-08-01 – 2015-08-02 (×2): 81 mg via ORAL
  Filled 2015-08-01 (×2): qty 1

## 2015-08-01 MED ORDER — PANTOPRAZOLE SODIUM 40 MG PO TBEC: 40.0000 mg | DELAYED_RELEASE_TABLET | Freq: Every day | ORAL | Status: DC

## 2015-08-01 MED ORDER — DONEPEZIL HCL 10 MG PO TABS
15.0000 mg | ORAL_TABLET | Freq: Every day | ORAL | Status: DC
  Administered 2015-08-01: 15 mg via ORAL
  Filled 2015-08-01: qty 2

## 2015-08-01 MED ORDER — HALOPERIDOL LACTATE 5 MG/ML IJ SOLN
3.0000 mg | Freq: Once | INTRAMUSCULAR | Status: AC
  Administered 2015-08-01: 3 mg via INTRAVENOUS

## 2015-08-01 MED ORDER — HALOPERIDOL LACTATE 5 MG/ML IJ SOLN
INTRAMUSCULAR | Status: AC
  Filled 2015-08-01: qty 1

## 2015-08-01 MED ORDER — MESALAMINE 1.2 G PO TBEC
1.2000 g | DELAYED_RELEASE_TABLET | Freq: Every day | ORAL | Status: DC
  Administered 2015-08-01: 1.2 g via ORAL
  Filled 2015-08-01 (×2): qty 1

## 2015-08-01 MED ORDER — ALBUTEROL SULFATE HFA 108 (90 BASE) MCG/ACT IN AERS: 2.0000 | INHALATION_SPRAY | Freq: Four times a day (QID) | RESPIRATORY_TRACT | Status: DC | PRN

## 2015-08-01 MED ORDER — POLYETHYLENE GLYCOL 3350 17 G PO PACK: 17.0000 g | PACK | Freq: Every day | ORAL | Status: DC | PRN

## 2015-08-01 MED ORDER — CLOPIDOGREL BISULFATE 75 MG PO TABS
75.0000 mg | ORAL_TABLET | Freq: Every day | ORAL | Status: DC
  Administered 2015-08-01 – 2015-08-02 (×2): 75 mg via ORAL
  Filled 2015-08-01 (×2): qty 1

## 2015-08-01 MED ORDER — QUETIAPINE FUMARATE 50 MG PO TABS
25.0000 mg | ORAL_TABLET | Freq: Two times a day (BID) | ORAL | Status: DC
  Administered 2015-08-01 (×2): 25 mg via ORAL
  Filled 2015-08-01 (×2): qty 1

## 2015-08-01 MED ORDER — SIMVASTATIN 10 MG PO TABS
10.0000 mg | ORAL_TABLET | ORAL | Status: DC
  Administered 2015-08-01: 10 mg via ORAL
  Filled 2015-08-01: qty 1

## 2015-08-01 MED ORDER — NITROGLYCERIN 0.4 MG SL SUBL: 0.4000 mg | SUBLINGUAL_TABLET | SUBLINGUAL | Status: DC | PRN

## 2015-08-01 MED ORDER — LEVOTHYROXINE SODIUM 25 MCG PO TABS
125.0000 ug | ORAL_TABLET | Freq: Every day | ORAL | Status: DC
  Administered 2015-08-02: 125 ug via ORAL
  Filled 2015-08-01: qty 1

## 2015-08-01 NOTE — Progress Notes (Signed)
PULMONARY / CRITICAL CARE MEDICINE   Name: Jodi Frazier MRN: [id number] DOB: 02/13/1931    ADMISSION DATE:  07/30/2015 CONSULTATION DATE:  07/31/2015  REFERRING MD:  Dhungel  CHIEF COMPLAINT:  Confusion  BREIF:  82 y/o female with COPD and lung cancer followed by Dr. Lamonte Sakai admitted on 1/29 with confusion with a UTI.  SUBJECTIVE:  Some confusion overnight Didn't get CXR No complaints of cough, dyspnea  VITAL SIGNS: BP 151/59 mmHg  Pulse 81  Temp(Src) 97.4 F (36.3 C) (Oral)  Resp 18  Ht '5\' 11"'$  (1.803 m)  Wt 181 lb (82.1 kg)  BMI 25.26 kg/m2  SpO2 97%  HEMODYNAMICS:    VENTILATOR SETTINGS:    INTAKE / OUTPUT: I/O last 3 completed shifts: In: 1908.3 [P.O.:300; I.V.:1508.3; IV Piggyback:100] Out: -   PHYSICAL EXAMINATION: General:  Awake, alert, in bed Neuro:  Awake, some word finding difficulty, maew HEENT:  NCAT, OP clear Cardiovascular:  RRR, no mgr Lungs:  CTA B, normal effort Abdomen:  BS+, soft, nontender Derm: no rash or skin breakdown Psyche: when awake pleasant mood and affect  LABS:  BMET  Recent Labs Lab 07/27/15 0533 07/30/15 1911 08/01/15 0402  NA 140 134* 137  K 4.4 3.4* 4.0  CL 109 101 107  CO2 '23 22 22  '$ BUN 22* 20 13  CREATININE 0.95 0.91 0.70  GLUCOSE 125* 113* 106*    Electrolytes  Recent Labs Lab 07/27/15 0533 07/30/15 1911 07/31/15 0830 08/01/15 0402  CALCIUM 9.8 8.8*  --  8.8*  MG  --   --  1.8  --     CBC  Recent Labs Lab 07/30/15 1911 07/31/15 0351 08/01/15 0402  WBC 15.6* 12.6* 12.9*  HGB 11.5* 10.7* 9.9*  HCT 36.6 33.3* 30.9*  PLT 444* 375 367    Coag's  Recent Labs Lab 07/25/15 1150  APTT 30  INR 1.14    Sepsis Markers  Recent Labs Lab 07/31/15 0830  LATICACIDVEN 1.9  PROCALCITON <0.10    ABG No results for input(s): PHART, PCO2ART, PO2ART in the last 168 hours.  Liver Enzymes  Recent Labs Lab 07/30/15 1911  AST 23  ALT 16  ALKPHOS 131*  BILITOT 0.8  ALBUMIN 2.9*     Cardiac Enzymes No results for input(s): TROPONINI, PROBNP in the last 168 hours.  Glucose  Recent Labs Lab 07/30/15 1920  GLUCAP 116*    Imaging  1/29 CXR images personally reviewed and compared to the older films, there appears to be enlargement of the left effusion and possibly atelectasis vs an infiltrate in the left base, but this is an AP film and all recent films are PA/Lat  STUDIES:    CULTURES: 1/29 blood > 1/29 urine >  ANTIBIOTICS: 1/29 ceftriaxone >  SIGNIFICANT EVENTS:   LINES/TUBES:   DISCUSSION: 82 y/o female with a history of recurrent UTI's admitted on 1/30 with fever and confusion likely due to UTI.  PCCM asked by family to rule out pneumonia given abnormal CXR with baseline lung cancer.  I don't think she has pneumonia but we are waiting on a repeat 2 view CXR.  ASSESSMENT / PLAN:  PULMONARY A: Abnormal CXR> doubt pneumonia, suspect related to known left lung adenocarcinoma  Adenocarcinoma> s/p chemo/rad followed by Dr. Lamonte Sakai Left pleural effusion> sub acute, has been seen in imaging in 2016 COPD, not in exacerbation P:   Repeat CXR 2 view now May need repeat CT or CXR as outpatient  Add Dulera when in hospital, then  go back to [facility] as outpatient Add prn albuterol  INFECTIOUS A:   UTI> recent organisms include ecoli (essentially susceptible) and enterococcus P:   Would continue ceftriaxone for now F/u cultures  FAMILY  - Updates: Daughter ([facility] RN from PACU) updated bedside 1/31   Roselie Awkward, MD [place] [facility] Pager: [phone] Cell: [phone] After 3pm or if no response, call [phone]  08/01/2015, 9:21 AM

## 2015-08-01 NOTE — Progress Notes (Signed)
Called Joelene Millin to inform her that zithromax was on IV pole already and just needed to be scanned.

## 2015-08-01 NOTE — Evaluation (Signed)
Physical Therapy Evaluation Patient Details Name: Jodi Frazier MRN: [id number] DOB: February 02, 1931 Today's Date: 08/01/2015   History of Present Illness  Jodi Frazier is a 82 y.o. female with a past medical history of essential hypertension, CAD, hyperlipidemia, urolithiasis, COPD, GERD, anxiety, diverticulosis, MI and CVA who is brought to the emergency department 4 days after left hip arthroplasty surgery by Dr. Alvan Dame with fever and altered mental status.  Clinical Impression  Patient presents with decreased mobility due to deficits listed in PT problem list.  She will benefit from skilled PT in the acute setting to allow return home with family assist and HHPT.  Currently at mod A level for mobility and limited by weakness, orthostasis.      Follow Up Recommendations Home health PT;Supervision/Assistance - 24 hour    Equipment Recommendations  Wheelchair (measurements PT);Wheelchair cushion (measurements PT)    Recommendations for Other Services       Precautions / Restrictions Precautions Precautions: Fall Restrictions LLE Weight Bearing: Weight bearing as tolerated      Mobility  Bed Mobility Overal bed mobility: Needs Assistance Bed Mobility: Supine to Sit     Supine to sit: Mod assist Sit to supine: Mod assist   General bed mobility comments: assist for legs off EOB and to lift trunk upright, to supine assist for lowering trunk and lifting L leg in bed  Transfers Overall transfer level: Needs assistance Equipment used: Rolling walker (2 wheeled) Transfers: Sit to/from Stand Sit to Stand: Mod assist         General transfer comment: up from low bed, pt sat right back down c/o weakness,  Stood second trial and BP noted to bed 99/43 and pt dyspniec, so returned to supine  Ambulation/Gait                Stairs            Wheelchair Mobility    Modified Rankin (Stroke Patients Only)       Balance Overall balance assessment: Needs  assistance Sitting-balance support: Feet supported Sitting balance-Leahy Scale: Fair     Standing balance support: Bilateral upper extremity supported Standing balance-Leahy Scale: Poor Standing balance comment: min A and UE support due to weakness                             Pertinent Vitals/Pain Pain Assessment: Faces Faces Pain Scale: Hurts little more Pain Location: L hip Pain Descriptors / Indicators: Sore Pain Intervention(s): Monitored during session;Repositioned    Home Living Family/patient expects to be discharged to:: Private residence Living Arrangements: Children Available Help at Discharge: Family;Available 24 hours/day Type of Home: House Home Access: Stairs to enter Entrance Stairs-Rails: None Entrance Stairs-Number of Steps: 1 Home Layout: One level Home Equipment: Bedside commode;Walker - 4 wheels;Shower seat      Prior Function Level of Independence: Needs assistance   Gait / Transfers Assistance Needed: was needing assist with walker at home since THA  ADL's / Homemaking Assistance Needed: daughter assists patient with all ADLs        Hand Dominance   Dominant Hand: Right    Extremity/Trunk Assessment               Lower Extremity Assessment: RLE deficits/detail;LLE deficits/detail RLE Deficits / Details: AROM WFL, strength 4/5 LLE Deficits / Details: AAROM WFL, strength 3/5     Communication   Communication: No difficulties  Cognition Arousal/Alertness: Awake/alert Behavior During Therapy:  WFL for tasks assessed/performed Overall Cognitive Status: Impaired/Different from baseline Area of Impairment: Orientation;Memory;Problem solving;Following commands Orientation Level: Time;Situation   Memory: Decreased short-term memory Following Commands: Follows one step commands with increased time     Problem Solving: Slow processing      General Comments      Exercises Total Joint Exercises Ankle Circles/Pumps:  AROM;Both;Supine;10 reps Short Arc Quad: AROM;Left;10 reps;Supine Heel Slides: AAROM;Left;Supine;10 reps      Assessment/Plan    PT Assessment Patient needs continued PT services  PT Diagnosis Generalized weakness;Difficulty walking   PT Problem List Decreased strength;Pain;Decreased activity tolerance;Decreased knowledge of use of DME;Decreased balance;Decreased safety awareness;Decreased mobility  PT Treatment Interventions DME instruction;Balance training;Gait training;Functional mobility training;Patient/family education;Therapeutic activities;Therapeutic exercise   PT Goals (Current goals can be found in the Care Plan section) Acute Rehab PT Goals Patient Stated Goal: To return to home with assist PT Goal Formulation: With patient/family Time For Goal Achievement: 08/08/15 Potential to Achieve Goals: Good    Frequency Min 3X/week   Barriers to discharge        Co-evaluation               End of Session Equipment Utilized During Treatment: Gait belt Activity Tolerance: Patient limited by fatigue Patient left: with call bell/phone within reach;in bed;with bed alarm set;with family/visitor present           Time: 6378-5885 PT Time Calculation (min) (ACUTE ONLY): 30 min   Charges:   PT Evaluation $PT Eval Moderate Complexity: 1 Procedure PT Treatments $Therapeutic Activity: 8-22 mins   PT G CodesReginia Naas 08/07/2015, 12:08 PM  Magda Kiel, [place] 2015-08-07

## 2015-08-01 NOTE — Progress Notes (Signed)
[facility] PROGRESS NOTE  Jodi Frazier YNW:[id number] DOB: Mar 03, 1931 DOA: 07/30/2015 PCP: Leeanne Rio, PA-C Brief narrative 82 year old female with history of hypertension, coronary artery disease, urolithiasis, COPD (see Dr. Lamonte Sakai), GERD, anxiety, diverticulosis, adenocarcinoma of the left lung diagnosed in 2014 status post radiation (refused chemotherapy), history of stroke, MI who recently underwent total left hip arthroplasty on 1/25 and was discharged home presented to the ED with fever and acute encephalopathy. Daughter reports the patient is very sensitive to opiates and benzodiazepines. Patient received Zanaflex at home on the day of admission for her hip spasm. Subsequently became confused. During the day she had a fever and her confusion worsened. EMS was called and patient brought to the ED. In the ED she had a fever 101.40F, tachypnea and hypertensive. She was encephalopathic and agitated. Blood work showed WBC of 15.6, hemoglobin 11.5 and platelets of 444. Chemistry showed potassium of 3.4. Lactic acid was normal. Chest x-ray showed possible left lower lobe infiltrate and UA was positive for UTI. Patient met criteria for sepsis and was admitted to stepdown unit.   Assessment/Plan: Sepsis secondary to UTI and left lobar pneumonia  Sepsis pathway not initiated on admission now resolved. Follow Cultures sent on admission. On empiric Rocephin. Added azithromycin to cover for left lung infiltrate. Remains afebrile. Continues to have encephalopathy with sundowning. Recurrent UTIs mainly due to Escherichia coli and sensitive to cephalosporins. Transfer to medical floor.  Active problems Acute encephalopathy Secondary to sepsis, use of narcotics and Zanaflex at home. Patient is also on Xanax when necessary at home for anxiety, amitriptyline and Neurontin which have all been discontinued including Vicodin which was prescribed after the surgery.  Patient having severe  sundowning with agitation and restlessness requiring when necessary IV Haldol. With continued along with scheduled low-dose Seroquel. Transferred to medical floor with a Barrister's clerk.  Uncontrolled hypertension Has elevated systolic blood pressure. I have resumed her home blood pressure medications and place her on when necessary IV hydralazine.  S/p Left hip arthroplasty on 1/25 Stable. When necessary IV Toradol for pain. Avoid narcotics. PT evaluation.  Protein calorie malnutrition Continue supplement.  COPD Stable. Continue albuterol inhaler. Follows with Dr. Lamonte Sakai. Appreciate PC CM follow-up while in the hospital.  Coronary artery disease with history of MI and stroke Continue aspirin, Plavix, Coreg and statin.  Left lung adenocarcinoma Status post radiation therapy and history of radiation pneumonitis. Has chronic left pleural effusion and is one Breo..  ? Vascular dementia On Aricept. (Daughter reports she has been getting these prophylactically for last 15 years given her strong family history)  Hypothyroidism Continue Synthroid  Diet: soft as tolerated  DVT prophylaxis: Add Lovenox. Was discharged home on her home dose of baby aspirin and Plavix by surgery. Code Status: DO NOT RESUSCITATE Family Communication: Daughter at bedside Disposition Plan: Transfer to medical floor. Home once final cultures obtain and mentation better.   Consultants:  None  Procedures:  Head CT  Antibiotics:  IV Rocephin 1/29  IV azithro 1/30--  HPI/Subjective: Seen and examined. Last evening patient again became very confused and agitated trying to rule out her IV lines and trying to get out of bed, hitting on the nursing staff. calmed down after receiving Haldol. Remains afebrile  Objective: Filed Vitals:   08/01/15 0800 08/01/15 0836  BP:  151/59  Pulse:  81  Temp: 97.4 F (36.3 C)   Resp:  18    Intake/Output Summary (Last 24 hours) at 08/01/15 1154 Last data  filed at 08/01/15 1696  Gross per 24 hour  Intake   1070 ml  Output      0 ml  Net   1070 ml   Filed Weights   07/30/15 2100 07/31/15 0647  Weight: 80.2 kg (176 lb 12.9 oz) 82.1 kg (181 lb)    Exam:   General:  Elderly female in no acute distress, appears pleasantly confused  HEENT: No pallor, moist mucosa  Chest: Clear bilaterally  CVS: Normal S1 and S2, no murmurs rubs or gallop  GI: Soft, nondistended, nontender  Musculoskeletal: Dressing over left hip appears clean, has good range of motion  CNS: AAO 1, confused, nonfocal  Data Reviewed: Basic Metabolic Panel:  Recent Labs Lab 07/26/15 0410 07/27/15 0533 07/30/15 1911 07/31/15 0830 08/01/15 0402  NA 137 140 134*  --  137  K 4.9 4.4 3.4*  --  4.0  CL 108 109 101  --  107  CO2 '23 23 22  ' --  22  GLUCOSE 118* 125* 113*  --  106*  BUN 17 22* 20  --  13  CREATININE 0.91 0.95 0.91  --  0.70  CALCIUM 9.4 9.8 8.8*  --  8.8*  MG  --   --   --  1.8  --    Liver Function Tests:  Recent Labs Lab 07/30/15 1911  AST 23  ALT 16  ALKPHOS 131*  BILITOT 0.8  PROT 6.3*  ALBUMIN 2.9*   No results for input(s): LIPASE, AMYLASE in the last 168 hours. No results for input(s): AMMONIA in the last 168 hours. CBC:  Recent Labs Lab 07/26/15 0410 07/27/15 0533 07/30/15 1911 07/31/15 0351 08/01/15 0402  WBC 17.2* 14.7* 15.6* 12.6* 12.9*  NEUTROABS  --   --  12.7* 9.4* 10.3*  HGB 11.2* 10.6* 11.5* 10.7* 9.9*  HCT 36.8 35.0* 36.6 33.3* 30.9*  MCV 84.4 84.3 81.3 81.0 82.0  PLT 367 345 444* 375 367   Cardiac Enzymes: No results for input(s): CKTOTAL, CKMB, CKMBINDEX, TROPONINI in the last 168 hours. BNP (last 3 results)  Recent Labs  12/04/14 1812  BNP 60.4    ProBNP (last 3 results) No results for input(s): PROBNP in the last 8760 hours.  CBG:  Recent Labs Lab 07/30/15 1920  GLUCAP 116*    Recent Results (from the past 240 hour(s))  Blood culture (routine x 2)     Status: None (Preliminary  result)   Collection Time: 07/30/15  7:10 PM  Result Value Ref Range Status   Specimen Description BLOOD LEFT ANTECUBITAL  Final   Special Requests BOTTLES DRAWN AEROBIC AND ANAEROBIC 5CC  Final   Culture   Final    NO GROWTH 1 DAY Performed at [facility]    Report Status PENDING  Incomplete  Culture, blood (Routine X 2) w Reflex to ID Panel     Status: None (Preliminary result)   Collection Time: 07/30/15  7:20 PM  Result Value Ref Range Status   Specimen Description BLOOD RIGHT ANTECUBITAL  Final   Special Requests IN PEDIATRIC BOTTLE Axis  Final   Culture   Final    NO GROWTH 1 DAY Performed at [facility]    Report Status PENDING  Incomplete  MRSA PCR Screening     Status: None   Collection Time: 07/30/15 10:15 PM  Result Value Ref Range Status   MRSA by PCR NEGATIVE NEGATIVE Final    Comment:        The GeneXpert  MRSA Assay (FDA approved for NASAL specimens only), is one component of a comprehensive MRSA colonization surveillance program. It is not intended to diagnose MRSA infection nor to guide or monitor treatment for MRSA infections.      Studies: Dg Chest 2 View  08/01/2015  CLINICAL DATA:  82 year old female with shortness of breath for 2 days. Left lower lobe lung cancer last re-staged by PET-CT in October 2016. Initial encounter. EXAM: CHEST  2 VIEW COMPARISON:  07/30/2015 and earlier. FINDINGS: Postoperative and post treatment changes in the left lung with improved lung base ventilation since 07/30/2015. Left lung base appearance now at baseline compared to 05/31/2015. Bilateral upper lobe nodularity has not significantly changed. No pneumothorax, pulmonary edema, pleural effusion, or new pulmonary opacity. Stable cardiac size and mediastinal contours. Calcified aortic atherosclerosis. Osteopenia. Stable visualized osseous structures. IMPRESSION: Improved left lung base ventilation since 07/30/2015. Radiographic appearance of the chest now at the  post treatment baseline compared to November. Electronically Signed   By: Genevie Ann M.D.   On: 08/01/2015 10:08   Ct Head Wo Contrast  07/30/2015  CLINICAL DATA:  Sudden onset altered mental status today. EXAM: CT HEAD WITHOUT CONTRAST TECHNIQUE: Contiguous axial images were obtained from the base of the skull through the vertex without intravenous contrast. COMPARISON:  05/31/2015 FINDINGS: The brainstem, cerebellum, cerebral peduncles, thalami, basal ganglia, basilar cisterns, and ventricular system appear within normal limits. Periventricular white matter and corona radiata hypodensities favor chronic ischemic microvascular white matter disease. No intracranial hemorrhage, mass lesion, or acute CVA. Cerebral atrophy, age appropriate. There is atherosclerotic calcification of the cavernous carotid arteries bilaterally. There is potentially some type of plate along the right orbital floor although this is obscured by motion artifact, correlate with operative history. IMPRESSION: 1. No acute intracranial findings. 2. Periventricular white matter and corona radiata hypodensities favor chronic ischemic microvascular white matter disease. Electronically Signed   By: Van Clines M.D.   On: 07/30/2015 19:44   Dg Chest Port 1 View  07/30/2015  CLINICAL DATA:  Altered mental status. Left lung carcinoma. Pneumoniaa. EXAM: PORTABLE CHEST 1 VIEW COMPARISON:  05/31/2015 FINDINGS: New opacity seen in the retrocardiac left lung base, suspicious for pneumonia. Left midlung scarring is stable in appearance. Nodular opacity in the right upper is unchanged. Heart size is stable. IMPRESSION: New opacity in left retrocardiac lung base, suspicious for pneumonia. Stable left perihilar scarring and right upper lobe nodular density. Electronically Signed   By: Earle Gell M.D.   On: 07/30/2015 19:38    Scheduled Meds: . amLODipine  2.5 mg Oral Daily  . azithromycin  500 mg Intravenous Q24H  . carvedilol  6.25 mg Oral BID  WC  . cefTRIAXone (ROCEPHIN)  IV  1 g Intravenous Q24H  . enoxaparin (LOVENOX) injection  40 mg Subcutaneous Q24H  . feeding supplement (ENSURE ENLIVE)  237 mL Oral BID BM  . mometasone-formoterol  2 puff Inhalation BID  . pantoprazole  40 mg Oral QHS  . QUEtiapine  25 mg Oral BID   Continuous Infusions: . 0.9 % NaCl with KCl 20 mEq / L 100 mL/hr at 07/31/15 2241      Time spent: 25 minutes    Tamasha Laplante, [place]  [facility] Pager [phone]. If 7PM-7AM, please contact night-coverage at [email or web address], password [email or web address] 08/01/2015, 11:54 AM  LOS: 2 days

## 2015-08-01 NOTE — Progress Notes (Signed)
Pharmacy IV to PO conversion  The patient is receiving PANTOPRAZOLE by the intravenous route.  Based on criteria approved by the Pharmacy and [place], the medication is being converted to the equivalent oral dose form.   No active GI bleeding or impaired absorption  Not s/p esophagectomy  Documented ability to take oral medications for > 24 hr  Plan to continue treatment for at least 1 day  If you have any questions about this conversion, please contact the Pharmacy Department (ext [phone]).  Thank you.  Reuel Boom, PharmD Pager: [phone] 08/01/2015, 10:49 AM

## 2015-08-01 NOTE — Progress Notes (Signed)
Patient remained confused and agitated throughout the night. Patient was pulling at lines and equipment and tried to climb out of bed multiple times. Agitation decreased when treated with IV haldol.

## 2015-08-01 NOTE — Progress Notes (Signed)
Pt transferred to 1236 via wheelchair. Safety sitter present at bedside as well as daughter, York Cerise. Report given to Joelene Millin, RN and all questions answered.

## 2015-08-02 DIAGNOSIS — A419 Sepsis, unspecified organism: Principal | ICD-10-CM

## 2015-08-02 LAB — CBC WITH DIFFERENTIAL/PLATELET
BASOS PCT: 0 %
Basophils Absolute: 0 10*3/uL (ref 0.0–0.1)
EOS ABS: 0.3 10*3/uL (ref 0.0–0.7)
EOS PCT: 3 %
HCT: 31.8 % — ABNORMAL LOW (ref 36.0–46.0)
Hemoglobin: 9.9 g/dL — ABNORMAL LOW (ref 12.0–15.0)
Lymphocytes Relative: 13 %
Lymphs Abs: 1.4 10*3/uL (ref 0.7–4.0)
MCH: 25.8 pg — ABNORMAL LOW (ref 26.0–34.0)
MCHC: 31.1 g/dL (ref 30.0–36.0)
MCV: 83 fL (ref 78.0–100.0)
MONO ABS: 1.1 10*3/uL — AB (ref 0.1–1.0)
MONOS PCT: 10 %
Neutro Abs: 8.2 10*3/uL — ABNORMAL HIGH (ref 1.7–7.7)
Neutrophils Relative %: 74 %
PLATELETS: 395 10*3/uL (ref 150–400)
RBC: 3.83 MIL/uL — ABNORMAL LOW (ref 3.87–5.11)
RDW: 17.8 % — AB (ref 11.5–15.5)
WBC: 11 10*3/uL — ABNORMAL HIGH (ref 4.0–10.5)

## 2015-08-02 LAB — URINE CULTURE

## 2015-08-02 MED ORDER — LEVOFLOXACIN 750 MG PO TABS
750.0000 mg | ORAL_TABLET | Freq: Every day | ORAL | Status: DC
Start: 2015-08-02 — End: 2015-08-02

## 2015-08-02 MED ORDER — AZITHROMYCIN 500 MG PO TABS: 500.0000 mg | ORAL_TABLET | Freq: Every day | ORAL | Status: DC

## 2015-08-02 MED ORDER — CEFUROXIME AXETIL 250 MG PO TABS: 250.0000 mg | ORAL_TABLET | Freq: Two times a day (BID) | ORAL | Status: AC

## 2015-08-02 MED ORDER — ALPRAZOLAM 0.25 MG PO TABS: 0.1250 mg | ORAL_TABLET | Freq: Two times a day (BID) | ORAL | Status: DC | PRN

## 2015-08-02 MED ORDER — AZITHROMYCIN 250 MG PO TABS
500.0000 mg | ORAL_TABLET | Freq: Every day | ORAL | Status: DC
  Administered 2015-08-02: 500 mg via ORAL
  Filled 2015-08-02: qty 2

## 2015-08-02 MED FILL — AZITHROMYCIN 500 MG TABLET: 500 | 3 days supply | Qty: 3 | Fill #0

## 2015-08-02 MED FILL — LIALDA 1.2 GM TABLET SA: 1.2 | 30 days supply | Qty: 90 | Fill #3

## 2015-08-02 MED FILL — CEFUROXIME AXETIL 250 MG TA: 250 | 4 days supply | Qty: 8 | Fill #0

## 2015-08-02 NOTE — Discharge Summary (Signed)
Jodi Frazier, is a 82 y.o. female  DOB [birth year]  MRN [id number].  Admission date:  07/30/2015  Admitting Physician  Reubin Milan, MD  Discharge Date:  08/02/2015   Primary MD  Leeanne Rio, PA-C  Recommendations for primary care physician for things to follow:  - Please check CBC, BMP urine next visit. - Please repeat 2 view chest x-ray in 2 weeks   Admission Diagnosis  UTI (lower urinary tract infection) [N39.0] HCAP (healthcare-associated pneumonia) [J18.9]   Discharge Diagnosis  UTI (lower urinary tract infection) [N39.0] HCAP (healthcare-associated pneumonia) [J18.9]   Principal Problem:   Sepsis secondary to UTI ([place]) Active Problems:   COPD (chronic obstructive pulmonary disease) (HCC)   HTN (hypertension)   GERD (gastroesophageal reflux disease)   Anxiety   Essential hypertension   S/P left THA, AA   Abnormal CXR   HCAP (healthcare-associated pneumonia)   Acute encephalopathy      Past Medical History  Diagnosis Date  . Hypertension   . Colitis   . Coronary artery disease   . High cholesterol     takes Red Yeast Rice and Fish OIl daily  . Kidney stones   . PONV (postoperative nausea and vomiting)   . COPD (chronic obstructive pulmonary disease) (HCC)     pleurisy or COPD exascerbation > 35yrago  . Headache(784.0)   . Vertigo     HTN related bc gets up too fast  . Arthritis   . Bruises easily   . History of shingles 191yrago    in eye;occasionally gets some residual   . Accessory skin tags     arms/legs  . GERD (gastroesophageal reflux disease)   . Colitis   . Diverticulosis   . Urinary frequency   . Stress incontinence   . History of kidney stones   . Hypothyroidism     takes sYnthroid daily  . Cataract     early stage on right  . Depression     some but takes Amitryptylline nightly  . Right-sided chest wall pain 08/28/12    PRESENTATION -  RIGHT SIDED ANTERIOR CHEST PAIN  . Weight loss 08/28/12    10 LB WEIGHT LOSS OVER 3 MONTHS  . Lung mass 08/12/12    CHEST-XRAY/ PET -LOBULAR MASS LLL - 4.1 X 3.6 X 4.2 CM  . S/P radiation therapy  10/05/2012-11/20/2012    Left Lower Lung and hilum / 70 Gy in 35 fractions  . Hypertension   . At risk for fall due to comorbid condition     5 falls - last Early December  . Vertigo   . Orthostatic hypotension   . Adenocarcinoma of lung ([facility]/10/14    needle core bx-LLL-adenocarcinoma  . MI (myocardial infarction) ([facility]  . Stroke ([facility]    mild 2014, no deficits  . Shortness of breath     with exertion  . Pneumonia     hx of 20+yrs ago  . Macular degeneration, dry  left eye  . Avascular necrosis of bone of left hip ([place])   . Skin tear of hand without complication     RIGHT HAND FOR LAST 10 DAYS USRING DRESSING AND NEOSPORIN TO    Past Surgical History  Procedure Laterality Date  . Hip arthroscopy  55yrago    right hip-replacement  . Knee arthroscopy      LT  . Total knee arthroplasty  139yrago    left  . Appendectomy  3091yrgo  . Lithotripsy  2011  . Cataract surgery  2012    left  . Eye surgery  [id number]  . Bladder surgery  2001    tacked  . Colonoscopy    . Esophagogastroduodenoscopy    . Total knee arthroplasty  09/09/2011    Procedure: TOTAL KNEE ARTHROPLASTY;  Surgeon: StepRudean Haskell;  [facility];  Laterality: Right;  Right Total Knee Arthroplasty  . Bronchial brush biopsy Left 08/17/12    LLL Bronchial Washing / Brushing and Bronchial Biopsy: Negative for malignancy  . Left lower lobe needle core biopsy Left 09/07/12    Poorly Differentiated Adenocarcinoma   . Joint replacement    . Stents to heart  2010    x 2 to right rca  . Coronary angioplasty with stent placement  2010    2 in rt coronary artery and 1 in another spot  . Abdominal hysterectomy  [id number]     complete  . Right knee replacement  2013  . Total hip  arthroplasty Left 07/25/2015    Procedure: LEFT TOTAL HIP ARTHROPLASTY ANTERIOR APPROACH;  Surgeon: MatthParalee Cancel  Location: [facility];  Service: Orthopedics;  Laterality: Left;       History of present illness and  Hospital Course:     Kindly see H&P for history of present illness and admission details, please review complete Labs, Consult reports and Test reports for all details in brief  HPI  from the history and physical done on the day of admission 07/30/2015  HPI: Jodi Frazier 82 y.69 female with a past medical history of essential hypertension, CAD, hyperlipidemia, urolithiasis, COPD, GERD, anxiety, diverticulosis who is brought to the emergency department 4 days after left hip arthroplasty surgery by Dr. Olin Alvan Dame fever and altered mental status.   Per family members, while she was in the hospital earlier this week, during her left hip arthroplasty, she felt like she may have had an UTI. However, she went home and was doing well and did not have any significant symptoms until this morning. The patient woke up and was given Zanaflex and a while later became mildly confused. Patient subsequently during the day developed a fever and her confusion increased so she was brought to the emergency department via EMS.  Workup in the emergency department shows urinalysis with pyuria and leukocytosis consistent with UTI. The patient was unable to provide history since she was febrile, delirious and have been given lorazepam earlier.   Hospital Course  82 ye39 old female with history of hypertension, coronary artery disease, urolithiasis, COPD (see Dr. ByrumLamonte SakaiRD, anxiety, diverticulosis, adenocarcinoma of the left lung diagnosed in 2014 status post radiation (refused chemotherapy), history of stroke, MI who recently underwent total left hip arthroplasty on 1/25 and was discharged home presented to the ED with fever and acute encephalopathy. Daughter reports the patient is very sensitive  to opiates and benzodiazepines. Patient received Zanaflex at home on the day of  admission for her hip spasm. Subsequently became confused. During the day she had a fever and her confusion worsened. EMS was called and patient brought to the ED. In the ED she had a fever 101.25F, tachypnea and hypertensive. She was encephalopathic and agitated. Blood work showed WBC of 15.6, hemoglobin 11.5 and platelets of 444. Chemistry showed potassium of 3.4. Lactic acid was normal. Chest x-ray showed possible left lower lobe infiltrate and UA was positive for UTI. Patient met criteria for sepsis and was admitted to stepdown unit.  Sepsis secondary to UTI and left lobar pneumonia - Sepsis pathway not initiated on admission , sepsis resolved at time of discharge .-  - Blood cultures remain negative, urine culture growing Escherichia coli sensitive to Rocephin treated total of 3 days off by mouth Rocephin inpatient , to finish another 4 days as an outpatient , started on azithromycin to cover for left lung infiltrate, to finish another 3 days of by mouth azithromycin as an outpatient  - Remains afebrile.    Acute encephalopathy - Secondary to sepsis, use of narcotics and Zanaflex at home. Patient is also on Xanax when necessary at home for anxiety, amitriptyline and Neurontin which have all been discontinued including Vicodin which was prescribed after the surgery.  - Patient with severe sundowning with agitation and restlessness requiring when necessary IV Haldol. With continued along with scheduled low-dose Seroquel. Significant improvement of mental status, currently off Seroquel, Zanaflex held on discharge, the next dose was lowered.   Uncontrolled hypertension - Continue with home medication  S/p Left hip arthroplasty on 1/25 - Continue PT at home  Protein calorie malnutrition Continue supplement.  COPD Stable. Continue albuterol inhaler. Follows with Dr. Lamonte Sakai. Appreciate PC CM follow-up while in the  hospital.  Coronary artery disease with history of MI and stroke Continue aspirin, Plavix, Coreg and statin.  Left lung adenocarcinoma Status post radiation therapy and history of radiation pneumonitis. Has chronic left pleural effusion and is one Breo..  ? Vascular dementia On Aricept. (Daughter reports she has been getting these prophylactically for last 15 years given her strong family history)  Hypothyroidism Continue Synthroid   Discharge Condition:  Stable   Follow UP  Follow-up Information    Follow up with Leeanne Rio, PA-C. Schedule an appointment as soon as possible for a visit in 1 week.   Specialty:  Family Medicine   Contact information:   [address] [place] [address] [phone]         Discharge Instructions  and  Discharge Medications         Discharge Instructions    Discharge instructions    Complete by:  As directed   Follow with Primary MD Leeanne Rio, PA-C in 7 days   Get CBC, CMP, 2 view Chest X ray checked  by Primary MD next visit.    Activity: As tolerated with Full fall precautions use walker/cane & assistance as needed   Disposition Home   Diet: Heart Healthy , soft diet , with feeding assistance and aspiration precautions.  For Heart failure patients - Check your Weight same time everyday, if you gain over 2 pounds, or you develop in leg swelling, experience more shortness of breath or chest pain, call your Primary MD immediately. Follow Cardiac Low Salt Diet and 1.5 lit/day fluid restriction.   On your next visit with your primary care physician please Get Medicines reviewed and adjusted.   Please request your Prim.MD to go  over all Hospital Tests and Procedure/Radiological results at the follow up, please get all Hospital records sent to your Prim MD by signing hospital release before you go home.   If you experience worsening of your admission symptoms, develop shortness of breath,  life threatening emergency, suicidal or homicidal thoughts you must seek medical attention immediately by calling 911 or calling your MD immediately  if symptoms less severe.  You Must read complete instructions/literature along with all the possible adverse reactions/side effects for all the Medicines you take and that have been prescribed to you. Take any new Medicines after you have completely understood and accpet all the possible adverse reactions/side effects.   Do not drive, operating heavy machinery, perform activities at heights, swimming or participation in water activities or provide baby sitting services if your were admitted for syncope or siezures until you have seen by Primary MD or a Neurologist and advised to do so again.  Do not drive when taking Pain medications.    Do not take more than prescribed Pain, Sleep and Anxiety Medications  Special Instructions: If you have smoked or chewed Tobacco  in the last 2 yrs please stop smoking, stop any regular Alcohol  and or any Recreational drug use.  Wear Seat belts while driving.   Please note  You were cared for by a hospitalist during your hospital stay. If you have any questions about your discharge medications or the care you received while you were in the hospital after you are discharged, you can call the unit and asked to speak with the hospitalist on call if the hospitalist that took care of you is not available. Once you are discharged, your primary care physician will handle any further medical issues. Please note that NO REFILLS for any discharge medications will be authorized once you are discharged, as it is imperative that you return to your primary care physician (or establish a relationship with a primary care physician if you do not have one) for your aftercare needs so that they can reassess your need for medications and monitor your lab values.     Increase activity slowly    Complete by:  As directed              Medication List    STOP taking these medications        tiZANidine 4 MG tablet  Commonly known as:  ZANAFLEX      TAKE these medications        albuterol 108 (90 Base) MCG/ACT inhaler  Commonly known as:  PROVENTIL HFA;VENTOLIN HFA  Inhale 2 puffs into the lungs every 6 (six) hours as needed for wheezing.     ALPRAZolam 0.25 MG tablet  Commonly known as:  XANAX  Take 0.5 tablets (0.125 mg total) by mouth 2 (two) times daily as needed.     amitriptyline 25 MG tablet  Commonly known as:  ELAVIL  Take 1 tablet (25 mg total) by mouth at bedtime.     amLODipine 2.5 MG tablet  Commonly known as:  NORVASC  TAKE 1 TABLET BY MOUTH DAILY     aspirin 81 MG EC tablet  Take 1 tablet (81 mg total) by mouth daily.     azithromycin 500 MG tablet  Commonly known as:  ZITHROMAX  Take 1 tablet (500 mg total) by mouth daily.     BIOTIN PO  Take 1 tablet by mouth daily.     carvedilol 6.25 MG tablet  Commonly known as:  COREG  TAKE 1 TABLET BY MOUTH 2 TIMES A DAY WITH A MEAL     cefUROXime 250 MG tablet  Commonly known as:  CEFTIN  Take 1 tablet (250 mg total) by mouth 2 (two) times daily with a meal.     celecoxib 100 MG capsule  Commonly known as:  CELEBREX  Take 1 capsule (100 mg total) by mouth 2 (two) times daily.     clopidogrel 75 MG tablet  Commonly known as:  PLAVIX  Take 1 tablet (75 mg total) by mouth daily.     cyanocobalamin 1000 MCG/ML injection  Commonly known as:  (VITAMIN B-12)  Inject 1 mL (1,000 mcg total) into the muscle every 30 (thirty) days. Last dose was on 01-29-14     docusate sodium 100 MG capsule  Commonly known as:  COLACE  Take 1 capsule (100 mg total) by mouth 2 (two) times daily.     donepezil 10 MG tablet  Commonly known as:  ARICEPT  Take 1.5 tablets (15 mg total) by mouth at bedtime. Takes 1 and 1/2     escitalopram 10 MG tablet  Commonly known as:  LEXAPRO  TAKE 1 TABLET BY MOUTH DAILY     ferrous sulfate 325 (65 FE) MG tablet  Take  1 tablet (325 mg total) by mouth 3 (three) times daily after meals.     Fluticasone Furoate-Vilanterol 100-25 MCG/INH Aepb  Commonly known as:  BREO ELLIPTA  Use 1 puff daily as directed.     gabapentin 100 MG capsule  Commonly known as:  NEURONTIN  Take 1 capsule (100 mg total) by mouth 2 (two) times daily.     HYDROcodone-acetaminophen 7.5-325 MG tablet  Commonly known as:  NORCO  Take 1-2 tablets by mouth every 4 (four) hours as needed for moderate pain.     levothyroxine 125 MCG tablet  Commonly known as:  SYNTHROID  TAKE 1 TABLET (125 MCG TOTAL) BY MOUTH DAILY BEFORE BREAKFAST.     lidocaine 5 %  Commonly known as:  LIDODERM  Place 1 patch onto the skin daily. Remove & Discard patch within 12 hours or as directed by MD     meclizine 25 MG tablet  Commonly known as:  ANTIVERT  Take 25 mg by mouth 3 (three) times daily as needed for dizziness.     mesalamine 1.2 g EC tablet  Commonly known as:  LIALDA  Take 1 tablet (1.2 g total) by mouth 3 (three) times daily.     mupirocin ointment 2 %  Commonly known as:  BACTROBAN  Place 1 application into the nose 2 (two) times daily as needed (irritation).     NEOSPORIN EX  Apply 1 application topically daily as needed (skin tears). USING DAILY TO RIGHT HAND SKIN TEAR     nitroGLYCERIN 0.4 MG SL tablet  Commonly known as:  NITROSTAT  Place 1 tablet (0.4 mg total) under the tongue every 5 (five) minutes as needed for chest pain.     ondansetron 8 MG tablet  Commonly known as:  ZOFRAN  Take 1 tablet (8 mg total) by mouth every 8 (eight) hours as needed for nausea or vomiting.     oxyCODONE 5 MG immediate release tablet  Commonly known as:  Oxy IR/ROXICODONE  Take 2.5 mg by mouth every 4 (four) hours as needed. pain     pantoprazole 40 MG tablet  Commonly known as:  PROTONIX  TAKE 1 TABLET BY MOUTH EVERY MORNING  polyethylene glycol packet  Commonly known as:  MIRALAX / GLYCOLAX  Take 17 g by mouth 2 (two) times daily.       simvastatin 10 MG tablet  Commonly known as:  ZOCOR  Take 1 tablet (10 mg total) by mouth every other day.          Diet and Activity recommendation: See Discharge Instructions above   Consults obtained -     Major procedures and Radiology Reports - PLEASE review detailed and final reports for all details, in brief -     Dg Chest 2 View  08/01/2015  CLINICAL DATA:  82 year old female with shortness of breath for 2 days. Left lower lobe lung cancer last re-staged by PET-CT in October 2016. Initial encounter. EXAM: CHEST  2 VIEW COMPARISON:  07/30/2015 and earlier. FINDINGS: Postoperative and post treatment changes in the left lung with improved lung base ventilation since 07/30/2015. Left lung base appearance now at baseline compared to 05/31/2015. Bilateral upper lobe nodularity has not significantly changed. No pneumothorax, pulmonary edema, pleural effusion, or new pulmonary opacity. Stable cardiac size and mediastinal contours. Calcified aortic atherosclerosis. Osteopenia. Stable visualized osseous structures. IMPRESSION: Improved left lung base ventilation since 07/30/2015. Radiographic appearance of the chest now at the post treatment baseline compared to November. Electronically Signed   By: Genevie Ann M.D.   On: 08/01/2015 10:08   Ct Head Wo Contrast  07/30/2015  CLINICAL DATA:  Sudden onset altered mental status today. EXAM: CT HEAD WITHOUT CONTRAST TECHNIQUE: Contiguous axial images were obtained from the base of the skull through the vertex without intravenous contrast. COMPARISON:  05/31/2015 FINDINGS: The brainstem, cerebellum, cerebral peduncles, thalami, basal ganglia, basilar cisterns, and ventricular system appear within normal limits. Periventricular white matter and corona radiata hypodensities favor chronic ischemic microvascular white matter disease. No intracranial hemorrhage, mass lesion, or acute CVA. Cerebral atrophy, age appropriate. There is atherosclerotic  calcification of the cavernous carotid arteries bilaterally. There is potentially some type of plate along the right orbital floor although this is obscured by motion artifact, correlate with operative history. IMPRESSION: 1. No acute intracranial findings. 2. Periventricular white matter and corona radiata hypodensities favor chronic ischemic microvascular white matter disease. Electronically Signed   By: Van Clines M.D.   On: 07/30/2015 19:44   Dg Chest Port 1 View  07/30/2015  CLINICAL DATA:  Altered mental status. Left lung carcinoma. Pneumoniaa. EXAM: PORTABLE CHEST 1 VIEW COMPARISON:  05/31/2015 FINDINGS: New opacity seen in the retrocardiac left lung base, suspicious for pneumonia. Left midlung scarring is stable in appearance. Nodular opacity in the right upper is unchanged. Heart size is stable. IMPRESSION: New opacity in left retrocardiac lung base, suspicious for pneumonia. Stable left perihilar scarring and right upper lobe nodular density. Electronically Signed   By: Earle Gell M.D.   On: 07/30/2015 19:38   Dg C-arm 1-60 Min-no Report  07/25/2015  CLINICAL DATA: surery C-ARM 1-60 MINUTES Fluoroscopy was utilized by the requesting physician.  No radiographic interpretation.   Dg Hip Port Unilat With Pelvis 1v Left  07/25/2015  CLINICAL DATA:  82 year old female status post left side anterior approach total hip replacement. Initial encounter. EXAM: DG HIP (WITH OR WITHOUT PELVIS) 1V PORT LEFT COMPARISON:  CT Abdomen and Pelvis 12/04/2014. FINDINGS: Semi upright AP portable and cross-table lateral views of the left hip including some of the pelvis at 1524 hours. Preexisting right hip total arthroplasty. Interval left hip total arthroplasty, hardware appears intact and normally aligned. Postoperative changes to  the surrounding soft tissues, subcutaneous gas. No unexpected osseous changes identified. Numerous chronic pelvic phleboliths. IMPRESSION: Left total hip arthroplasty with no  adverse features. These results will be called to the ordering clinician or representative by the Radiology Department at the imaging location. Electronically Signed   By: Genevie Ann M.D.   On: 07/25/2015 15:44    Micro Results    Recent Results (from the past 240 hour(s))  Blood culture (routine x 2)     Status: None (Preliminary result)   Collection Time: 07/30/15  7:10 PM  Result Value Ref Range Status   Specimen Description BLOOD LEFT ANTECUBITAL  Final   Special Requests BOTTLES DRAWN AEROBIC AND ANAEROBIC 5CC  Final   Culture   Final    NO GROWTH 2 DAYS Performed at [facility]    Report Status PENDING  Incomplete  Urine culture     Status: None   Collection Time: 07/30/15  7:15 PM  Result Value Ref Range Status   Specimen Description URINE, CATHETERIZED  Final   Special Requests NONE  Final   Culture   Final    >=100,000 COLONIES/mL ESCHERICHIA COLI Performed at [facility]    Report Status 08/02/2015 FINAL  Final   Organism ID, Bacteria ESCHERICHIA COLI  Final      Susceptibility   Escherichia coli - MIC*    AMPICILLIN >=32 RESISTANT Resistant     CEFAZOLIN <=4 SENSITIVE Sensitive     CEFTRIAXONE <=1 SENSITIVE Sensitive     CIPROFLOXACIN >=4 RESISTANT Resistant     GENTAMICIN 4 SENSITIVE Sensitive     IMIPENEM <=0.25 SENSITIVE Sensitive     NITROFURANTOIN <=16 SENSITIVE Sensitive     TRIMETH/SULFA <=20 SENSITIVE Sensitive     AMPICILLIN/SULBACTAM <=2 SENSITIVE Sensitive     PIP/TAZO <=4 SENSITIVE Sensitive     * >=100,000 COLONIES/mL ESCHERICHIA COLI  Culture, blood (Routine X 2) w Reflex to ID Panel     Status: None (Preliminary result)   Collection Time: 07/30/15  7:20 PM  Result Value Ref Range Status   Specimen Description BLOOD RIGHT ANTECUBITAL  Final   Special Requests IN PEDIATRIC BOTTLE 2CC  Final   Culture   Final    NO GROWTH 2 DAYS Performed at [facility]    Report Status PENDING  Incomplete  MRSA PCR Screening     Status:  None   Collection Time: 07/30/15 10:15 PM  Result Value Ref Range Status   MRSA by PCR NEGATIVE NEGATIVE Final    Comment:        The GeneXpert MRSA Assay (FDA approved for NASAL specimens only), is one component of a comprehensive MRSA colonization surveillance program. It is not intended to diagnose MRSA infection nor to guide or monitor treatment for MRSA infections.        Today   Subjective:   Jodi Frazier today has no headache,no chest or abdominal pain,, feels much better wants to go home today.  Objective:   Blood pressure 167/68, pulse 87, temperature 98 F (36.7 C), temperature source Axillary, resp. rate 20, height '5\' 11"'  (1.803 m), weight 82.1 kg (181 lb), SpO2 97 %.   Intake/Output Summary (Last 24 hours) at 08/02/15 1342 Last data filed at 08/01/15 1720  Gross per 24 hour  Intake    240 ml  Output      0 ml  Net    240 ml    Exam  General: Elderly female in no acute distress,  appears pleasantly confused  HEENT: No pallor, moist mucosa  Chest: Clear bilaterally  CVS: Normal S1 and S2, no murmurs rubs or gallop  GI: Soft, nondistended, nontender  Musculoskeletal: Dressing over left hip appears clean, has good range of motion  CNS: AAO 1, nonfocal Data Review   CBC w Diff:  Lab Results  Component Value Date   WBC 11.0* 08/02/2015   HGB 9.9* 08/02/2015   HCT 31.8* 08/02/2015   PLT 395 08/02/2015   LYMPHOPCT 13 08/02/2015   MONOPCT 10 08/02/2015   EOSPCT 3 08/02/2015   BASOPCT 0 08/02/2015    CMP:  Lab Results  Component Value Date   NA 137 08/01/2015   K 4.0 08/01/2015   CL 107 08/01/2015   CO2 22 08/01/2015   BUN 13 08/01/2015   BUN 15.6 04/22/2014   CREATININE 0.70 08/01/2015   CREATININE 1.0 04/22/2014   CREATININE 0.90 06/07/2013   PROT 6.3* 07/30/2015   ALBUMIN 2.9* 07/30/2015   BILITOT 0.8 07/30/2015   ALKPHOS 131* 07/30/2015   AST 23 07/30/2015   ALT 16 07/30/2015  .   Total Time in preparing paper work,  data evaluation and todays exam - 35 minutes  Tyriek Hofman M.D on 08/02/2015 at [facility]   Office  [phone]

## 2015-08-02 NOTE — Care Management Note (Signed)
Case Management Note  Patient Details  Name: Jodi Frazier MRN: [id number] Date of Birth: March 20, 1931  Subjective/Objective:      82 yo admitted with Sepsis secondary to UTI              Action/Plan: From home with children.  Expected Discharge Date:                  Expected Discharge Plan:  [place]  In-House Referral:  NA  Discharge planning Services  CM Consult  Post Acute Care Choice:  Resumption of Svcs/PTA Provider Choice offered to:  Adult Children  DME Arranged:  Programmer, multimedia DME Agency:  [facility] Arranged:  PT HH Agency:  [facility]  Status of Service:  Completed, signed off  Medicare Important Message Given:  Yes Date Medicare IM Given:    Medicare IM give by:    Date Additional Medicare IM Given:    Additional Medicare Important Message give by:     If discussed at [place] of Stay Meetings, dates discussed:    Additional Comments: Pt was active with [facility] prior to admission and would like to continue HHPT at discharge. [facility] rep contacted for referral. DME wheelchair ordered as well and [facility] DME rep contacted. No other CM needs communicated. Lynnell Catalan, RN 08/02/2015, 3:22 PM

## 2015-08-02 NOTE — Progress Notes (Signed)
[facility] P[facility]   chest x-ray results reviewed, no evidence of pneumonia Pulmonary critical care medicine will sign off  Roselie Awkward, MD [place] [place] [facility] Pager: [phone] Cell: [phone] After 3pm or if no response, call [phone]

## 2015-08-02 NOTE — Discharge Instructions (Signed)
Follow with Primary MD Leeanne Rio, PA-C in 7 days   Get CBC, CMP, 2 view Chest X ray checked  by Primary MD next visit.    Activity: As tolerated with Full fall precautions use walker/cane & assistance as needed   Disposition Home   Diet: Heart Healthy , soft diet , with feeding assistance and aspiration precautions.  For Heart failure patients - Check your Weight same time everyday, if you gain over 2 pounds, or you develop in leg swelling, experience more shortness of breath or chest pain, call your Primary MD immediately. Follow Cardiac Low Salt Diet and 1.5 lit/day fluid restriction.   On your next visit with your primary care physician please Get Medicines reviewed and adjusted.   Please request your Prim.MD to go over all Hospital Tests and Procedure/Radiological results at the follow up, please get all Hospital records sent to your Prim MD by signing hospital release before you go home.   If you experience worsening of your admission symptoms, develop shortness of breath, life threatening emergency, suicidal or homicidal thoughts you must seek medical attention immediately by calling 911 or calling your MD immediately  if symptoms less severe.  You Must read complete instructions/literature along with all the possible adverse reactions/side effects for all the Medicines you take and that have been prescribed to you. Take any new Medicines after you have completely understood and accpet all the possible adverse reactions/side effects.   Do not drive, operating heavy machinery, perform activities at heights, swimming or participation in water activities or provide baby sitting services if your were admitted for syncope or siezures until you have seen by Primary MD or a Neurologist and advised to do so again.  Do not drive when taking Pain medications.    Do not take more than prescribed Pain, Sleep and Anxiety Medications  Special Instructions: If you have smoked or  chewed Tobacco  in the last 2 yrs please stop smoking, stop any regular Alcohol  and or any Recreational drug use.  Wear Seat belts while driving.   Please note  You were cared for by a hospitalist during your hospital stay. If you have any questions about your discharge medications or the care you received while you were in the hospital after you are discharged, you can call the unit and asked to speak with the hospitalist on call if the hospitalist that took care of you is not available. Once you are discharged, your primary care physician will handle any further medical issues. Please note that NO REFILLS for any discharge medications will be authorized once you are discharged, as it is imperative that you return to your primary care physician (or establish a relationship with a primary care physician if you do not have one) for your aftercare needs so that they can reassess your need for medications and monitor your lab values.

## 2015-08-02 NOTE — Progress Notes (Signed)
Physical Therapy Treatment Patient Details Name: Jodi Frazier MRN: [id number] DOB: 22-Apr-1931 Today's Date: 08/02/2015    History of Present Illness  Pt 4 days post anterior approach total hip replacement admitted for AMS.  She has good family support and is planning to discharge home today     PT Comments    Pt still with weakness and need for assist for mobility, but family is present and feels they will be able to care for patient at home.  Wheelchair will assist as she is not able to walk a distance and family will be able to assist get her into home. She will continue with HHPT   Follow Up Recommendations  Home health PT     Equipment Recommendations  Wheelchair (measurements PT)    Recommendations for Other Services       Precautions / Restrictions Precautions Precautions: Fall Restrictions Weight Bearing Restrictions: No LLE Weight Bearing: Weight bearing as tolerated    Mobility  Bed Mobility Overal bed mobility: Needs Assistance Bed Mobility: Supine to Sit;Sit to Supine     Supine to sit: Mod assist Sit to supine: Mod assist   General bed mobility comments: assist to bring legs up onto bed.  needs consistent verbal cues to get to edge of bed   Transfers Overall transfer level: Needs assistance Equipment used: Rolling walker (2 wheeled) Transfers: Sit to/from Stand Sit to Stand: Mod assist         General transfer comment: needs assist to stand from a low surface, c/o some dizziness after up for a few minutes.  Blood pressure 172/69 after walking. nurse aware  repeated sit to stand x 5 times   Ambulation/Gait Ambulation/Gait assistance: Min assist Ambulation Distance (Feet): 15 Feet Assistive device: Rolling walker (2 wheeled) Gait Pattern/deviations: Step-to pattern Gait velocity: slow Gait velocity interpretation: Below normal speed for age/gender General Gait Details: needs consisiten cues for correct sequencing. c/o pain with weight bearing     Stairs            Wheelchair Mobility    Modified Rankin (Stroke Patients Only)       Balance Overall balance assessment: Needs assistance Sitting-balance support: Feet supported Sitting balance-Leahy Scale: Good (limited endurance )     Standing balance support: Single extremity supported Standing balance-Leahy Scale: Fair Standing balance comment: becomes dizzy after a few minutes                     Cognition Arousal/Alertness: Awake/alert Behavior During Therapy: WFL for tasks assessed/performed (need occasional redirection )                        Exercises Total Joint Exercises Ankle Circles/Pumps: AROM;Both;5 reps Gluteal Sets: Strengthening;Both;5 reps;Standing Short Arc Quad: AROM;Left;10 reps Heel Slides: AROM;Left;10 reps    General Comments General comments (skin integrity, edema, etc.): pt with large eccymosis on left arm, appears pale       Pertinent Vitals/Pain Pain Assessment: 0-10 Pain Score: 7  (with standing on left leg, decreases to 0 with sitting down ) Pain Location: l hip  Pain Descriptors / Indicators: Sore Pain Intervention(s): Monitored during session    Home Living                      Prior Function            PT Goals (current goals can now be found in the care plan section) Acute Rehab  PT Goals Patient Stated Goal: to go home .  Daughter Jodi Frazier said she and familyu are prepared to take patient home  PT Goal Formulation: With patient/family Time For Goal Achievement: 08/08/15 Potential to Achieve Goals: Good Progress towards PT goals: Progressing toward goals    Frequency  Min 3X/week    PT Plan Current plan remains appropriate    Co-evaluation             End of Session Equipment Utilized During Treatment: Gait belt Activity Tolerance: Patient limited by fatigue Patient left: with family/visitor present     Time: 5825-1898 PT Time Calculation (min) (ACUTE ONLY): 50  min  Charges:  $Gait Training: 8-22 mins $Therapeutic Exercise: 8-22 mins $Therapeutic Activity: 8-22 mins                    G Codes:        Maudry Diego, PT '@TODAY'$ @ 2:26 PM  Norwood Levo 08/02/2015, 2:23 PM

## 2015-08-02 NOTE — Care Management Important Message (Signed)
Important Message  Patient Details  Name: Jodi Frazier MRN: [id number] Date of Birth: 09-27-1930   Medicare Important Message Given:  Yes    Camillo Flaming 08/02/2015, 10:23 AMImportant Message  Patient Details  Name: Jodi Frazier MRN: [id number] Date of Birth: 08-May-1931   Medicare Important Message Given:  Yes    Camillo Flaming 08/02/2015, 10:23 AM

## 2015-08-03 ENCOUNTER — Telehealth: Payer: Self-pay | Admitting: Behavioral Health

## 2015-08-03 NOTE — Telephone Encounter (Signed)
Transition Care Management Follow-up Telephone Call  Admission date: 07/30/2015 Admitting Physician Reubin Milan, MD  Discharge Date: 08/02/2015   Primary MD Leeanne Rio, PA-C  Recommendations for primary care physician for things to follow:  - Please check CBC, BMP urine next visit. - Please repeat 2 view chest x-ray in 2 weeks   Admission Diagnosis UTI (lower urinary tract infection) [N39.0] HCAP (healthcare-associated pneumonia) [J18.9]   Discharge Diagnosis UTI (lower urinary tract infection) [N39.0] HCAP (healthcare-associated pneumonia) [J18.9]   Principal Problem:  Sepsis secondary to UTI ([place]) Active Problems:  COPD (chronic obstructive pulmonary disease) (HCC)  HTN (hypertension)  GERD (gastroesophageal reflux disease)  Anxiety  Essential hypertension  S/P left THA, AA  Abnormal CXR  HCAP (healthcare-associated pneumonia)  Acute encephalopathy   How have you been since you were released from the hospital? Patient stated, "Pretty good"; per the daughter, the patient only has three days left of the antibiotic to take and then she will be finished."   Do you understand why you were in the hospital? yes, after the hip replacement the patient developed a UTI.   Do you understand the discharge instructions? yes   Where were you discharged to? Home with daughter.   Items Reviewed:  Medications reviewed: yes  Allergies reviewed: yes  Dietary changes reviewed: yes, no changes were made to the patient's diet.  Referrals reviewed: None   Functional Questionnaire:   Activities of Daily Living (ADLs):   She states they are independent in the following: feeding States they require assistance with the following: ambulation, bathing and hygiene, continence, grooming, toileting and dressing, per the patient's daughter she and a physical therapist assist the patient with these ADLs.   Any transportation issues/concerns?: no   Any  patient concerns? no   Confirmed importance and date/time of follow-up visits scheduled yes, 08/11/15 at 11:30 AM.  Provider Appointment booked with Brunetta Jeans, PA-C.  Confirmed with patient if condition begins to worsen call PCP or go to the ER.  Patient was given the office number and encouraged to call back with question or concerns.  : yes

## 2015-08-05 LAB — CULTURE, BLOOD (ROUTINE X 2)
CULTURE: NO GROWTH
CULTURE: NO GROWTH

## 2015-08-07 ENCOUNTER — Other Ambulatory Visit: Payer: Self-pay | Admitting: Physician Assistant

## 2015-08-07 MED FILL — CARVEDILOL 6.25 MG TABLET: 6.25 | 90 days supply | Qty: 180 | Fill #0

## 2015-08-07 MED FILL — SIMVASTATIN 10 MG TABLET: 10 | 90 days supply | Qty: 45 | Fill #0

## 2015-08-07 MED FILL — CYANOCOBALAMIN 1,000 MCG/ML: 1000 | 30 days supply | Qty: 1 | Fill #0

## 2015-08-11 ENCOUNTER — Inpatient Hospital Stay: Payer: Medicare Other | Admitting: Physician Assistant

## 2015-08-14 ENCOUNTER — Encounter: Payer: Self-pay | Admitting: Medical

## 2015-08-14 ENCOUNTER — Ambulatory Visit (INDEPENDENT_AMBULATORY_CARE_PROVIDER_SITE_OTHER): Payer: Medicare Other | Admitting: Medical

## 2015-08-14 ENCOUNTER — Ambulatory Visit (HOSPITAL_BASED_OUTPATIENT_CLINIC_OR_DEPARTMENT_OTHER)
Admission: RE | Admit: 2015-08-14 | Discharge: 2015-08-14 | Disposition: A | Discharge status: Home / Self Care | Payer: Medicare Other | Source: Ambulatory Visit | Attending: Medical | Admitting: Medical

## 2015-08-14 ENCOUNTER — Inpatient Hospital Stay: Payer: Medicare Other | Admitting: Physician Assistant

## 2015-08-14 ENCOUNTER — Other Ambulatory Visit: Payer: Self-pay | Admitting: Physician Assistant

## 2015-08-14 VITALS — BP 110/60 | HR 85 | Temp 97.4°F | Ht 71.0 in

## 2015-08-14 DIAGNOSIS — F0390 Unspecified dementia without behavioral disturbance: Secondary | ICD-10-CM

## 2015-08-14 DIAGNOSIS — N390 Urinary tract infection, site not specified: Secondary | ICD-10-CM | POA: Diagnosis not present

## 2015-08-14 DIAGNOSIS — J189 Pneumonia, unspecified organism: Secondary | ICD-10-CM | POA: Diagnosis not present

## 2015-08-14 DIAGNOSIS — R918 Other nonspecific abnormal finding of lung field: Secondary | ICD-10-CM | POA: Insufficient documentation

## 2015-08-14 MED ORDER — CELECOXIB 100 MG PO CAPS: 100.0000 mg | ORAL_CAPSULE | Freq: Two times a day (BID) | ORAL | Status: AC

## 2015-08-14 MED ORDER — CEPHALEXIN 500 MG PO CAPS: ORAL_CAPSULE | ORAL | Status: DC

## 2015-08-14 MED ORDER — ALPRAZOLAM 0.25 MG PO TABS: 0.1250 mg | ORAL_TABLET | Freq: Two times a day (BID) | ORAL | Status: DC | PRN

## 2015-08-14 MED ORDER — CLOPIDOGREL BISULFATE 75 MG PO TABS: 75.0000 mg | ORAL_TABLET | Freq: Every day | ORAL | Status: AC

## 2015-08-14 MED ORDER — DONEPEZIL HCL 10 MG PO TABS: 15.0000 mg | ORAL_TABLET | Freq: Every day | ORAL | Status: AC

## 2015-08-14 MED FILL — CLOPIDOGREL 75 MG TABLET: 75 | 90 days supply | Qty: 90 | Fill #0

## 2015-08-14 MED FILL — CELECOXIB 100 MG CAPSULE: 100 | 30 days supply | Qty: 60 | Fill #0

## 2015-08-14 MED FILL — CEPHALEXIN 500 MG CAPSULE: 500 | 30 days supply | Qty: 30 | Fill #0

## 2015-08-14 MED FILL — DONEPEZIL HCL 10 MG TABLET: 10 | 90 days supply | Qty: 135 | Fill #1

## 2015-08-14 NOTE — Progress Notes (Signed)
Pre visit review using our clinic review tool, if applicable. No additional management support is needed unless otherwise documented below in the visit note. 

## 2015-08-14 NOTE — Progress Notes (Signed)
Subjective:    Patient ID: Jodi Frazier, female    DOB: 06/04/1931, 82 y.o.   MRN: [id number]  HPI  Pt had hip replacement about 3 weeks ago.   Pt felt like had a uti after discharge post hip surgery. Pt when in the hospital also got pneumonia.   Pt has history of chronic uti.   Pt was admitted due to sepsis and also had lt lower lobe pneumonia.  Pt blood culture were negative. But urine did grow out bacteria and bacteria was sensitive to rocephin.   Pt daughter is NP. She wants urine check, cbc, antibiotoicto prevent urine infection, and refill on xanax.  Family states pt had seen been on cephalexin in the past for prevention of uti. Per family urologist had recommended keflex 500 mg a day for prevention.  Pt has some anxiety. Will use xanax on occasion/very rare. More anxious since surgery.  Since discharge pt has been without uti symptoms. But faint suprapubic tenderness this am. Then pt thinks also mild constipated since being on iron.        Review of Systems  Constitutional: Negative for fever, chills and fatigue.  Respiratory: Negative for cough, choking, shortness of breath and wheezing.   Cardiovascular: Negative for chest pain and palpitations.  Gastrointestinal: Negative for nausea, vomiting, abdominal pain and diarrhea.       See physical exam. She appears to have faint suprapubic pressure.  Mild difficult passing stool this am.  Genitourinary: Negative for dysuria, urgency, flank pain and difficulty urinating.  Musculoskeletal: Negative for back pain.  Hematological: Negative for adenopathy. Does not bruise/bleed easily.  Psychiatric/Behavioral: Negative for behavioral problems and confusion.       Some dementia hx but she does not appear confused today.   Past Medical History  Diagnosis Date  . Hypertension   . Colitis   . Coronary artery disease   . High cholesterol     takes Red Yeast Rice and Fish OIl daily  . Kidney stones   . PONV  (postoperative nausea and vomiting)   . COPD (chronic obstructive pulmonary disease) (HCC)     pleurisy or COPD exascerbation > 50yrago  . Headache(784.0)   . Vertigo     HTN related bc gets up too fast  . Arthritis   . Bruises easily   . History of shingles 170yrago    in eye;occasionally gets some residual   . Accessory skin tags     arms/legs  . GERD (gastroesophageal reflux disease)   . Colitis   . Diverticulosis   . Urinary frequency   . Stress incontinence   . History of kidney stones   . Hypothyroidism     takes sYnthroid daily  . Cataract     early stage on right  . Depression     some but takes Amitryptylline nightly  . Right-sided chest wall pain 08/28/12    PRESENTATION - RIGHT SIDED ANTERIOR CHEST PAIN  . Weight loss 08/28/12    10 LB WEIGHT LOSS OVER 3 MONTHS  . Lung mass 08/12/12    CHEST-XRAY/ PET -LOBULAR MASS LLL - 4.1 X 3.6 X 4.2 CM  . S/P radiation therapy  10/05/2012-11/20/2012    Left Lower Lung and hilum / 70 Gy in 35 fractions  . Hypertension   . At risk for fall due to comorbid condition     5 falls - last Early December  . Vertigo   . Orthostatic hypotension   .  Adenocarcinoma of lung ([place]) 09/07/12    needle core bx-LLL-adenocarcinoma  . MI (myocardial infarction) ([place]) 2010  . Stroke ([place])     mild 2014, no deficits  . Shortness of breath     with exertion  . Pneumonia     hx of 20+yrs ago  . Macular degeneration, dry     left eye  . Avascular necrosis of bone of left hip ([place])   . Skin tear of hand without complication     RIGHT HAND FOR LAST 10 DAYS USRING DRESSING AND NEOSPORIN TO    Social History   Social History  . Marital Status: Widowed    Spouse Name: N/A  . Number of Children: 4  . Years of Education: college   Occupational History  . retired     Press photographer   Social History Main Topics  . Smoking status: Former Smoker -- 1.00 packs/day for 40 years    Types: Cigarettes    Quit date: 08/26/2002  . Smokeless tobacco:  Never Used  . Alcohol Use: Yes     Comment: OCC  . Drug Use: No  . Sexual Activity: No   Other Topics Concern  . Not on file   Social History Narrative   Daughter is nurse in neuro ICU   Patient lives at home alone   Patient left handed    Patient drinks coffee daily    Past Surgical History  Procedure Laterality Date  . Hip arthroscopy  2yrago    right hip-replacement  . Knee arthroscopy      LT  . Total knee arthroplasty  164yrago    left  . Appendectomy  3063yrgo  . Lithotripsy  2011  . Cataract surgery  2012    left  . Eye surgery  [id number]  . Bladder surgery  2001    tacked  . Colonoscopy    . Esophagogastroduodenoscopy    . Total knee arthroplasty  09/09/2011    Procedure: TOTAL KNEE ARTHROPLASTY;  Surgeon: StepRudean Haskell;  [facility];  Laterality: Right;  Right Total Knee Arthroplasty  . Bronchial brush biopsy Left 08/17/12    LLL Bronchial Washing / Brushing and Bronchial Biopsy: Negative for malignancy  . Left lower lobe needle core biopsy Left 09/07/12    Poorly Differentiated Adenocarcinoma   . Joint replacement    . Stents to heart  2010    x 2 to right rca  . Coronary angioplasty with stent placement  2010    2 in rt coronary artery and 1 in another spot  . Abdominal hysterectomy  [id number]     complete  . Right knee replacement  2013  . Total hip arthroplasty Left 07/25/2015    Procedure: LEFT TOTAL HIP ARTHROPLASTY ANTERIOR APPROACH;  Surgeon: MatthParalee Cancel  Location: [facility];  Service: Orthopedics;  Laterality: Left;    Family History  Problem Relation Age of Onset  . Anesthesia problems Mother   . Hypotension Neg Hx   . Malignant hyperthermia Neg Hx   . Pseudochol deficiency Neg Hx   . Cancer Maternal Aunt     breast    Allergies  Allergen Reactions  . Codeine Hives  . Augmentin [Amoxicillin-Pot Clavulanate] Diarrhea and Nausea And Vomiting  . Penicillins Hives    Has patient had a PCN reaction  causing immediate rash, facial/tongue/throat swelling, SOB or lightheadedness with hypotension: No Has patient had a PCN reaction causing severe  rash involving mucus membranes or skin necrosis: Hives  Has patient had a PCN reaction that required hospitalization: No Has patient had a PCN reaction occurring within the last 10 years: No     Current Outpatient Prescriptions on File Prior to Visit  Medication Sig Dispense Refill  . albuterol (PROVENTIL HFA;VENTOLIN HFA) 108 (90 Base) MCG/ACT inhaler Inhale 2 puffs into the lungs every 6 (six) hours as needed for wheezing. 2 Inhaler 1  . amitriptyline (ELAVIL) 25 MG tablet Take 1 tablet (25 mg total) by mouth at bedtime. 90 tablet 1  . amLODipine (NORVASC) 2.5 MG tablet TAKE 1 TABLET BY MOUTH DAILY 90 tablet 1  . aspirin EC 81 MG EC tablet Take 1 tablet (81 mg total) by mouth daily.    Marland Kitchen BIOTIN PO Take 1 tablet by mouth daily.     . carvedilol (COREG) 6.25 MG tablet TAKE 1 TABLET BY MOUTH TWICE DAILY WITH A MEAL 180 tablet 1  . cyanocobalamin (,VITAMIN B-12,) 1000 MCG/ML injection INJECT 1 ML (1,000 MCG TOTAL) INTO THE MUSCLE EVERY 30 (THIRTY) DAYS. LAST DOSE WAS ON 01-29-14 1 mL 1  . docusate sodium (COLACE) 100 MG capsule Take 1 capsule (100 mg total) by mouth 2 (two) times daily. (Patient taking differently: Take 100 mg by mouth daily as needed for moderate constipation. ) 10 capsule 0  . escitalopram (LEXAPRO) 10 MG tablet TAKE 1 TABLET BY MOUTH DAILY 90 tablet 1  . ferrous sulfate 325 (65 FE) MG tablet Take 1 tablet (325 mg total) by mouth 3 (three) times daily after meals.  3  . Fluticasone Furoate-Vilanterol (BREO ELLIPTA) 100-25 MCG/INH AEPB Use 1 puff daily as directed. (Patient taking differently: Take 1 puff by mouth daily. ) 60 each 0  . gabapentin (NEURONTIN) 100 MG capsule Take 1 capsule (100 mg total) by mouth 2 (two) times daily. (Patient taking differently: Take 100 mg by mouth 2 (two) times daily as needed (Pain). ) 60 capsule 0  .  HYDROcodone-acetaminophen (NORCO) 7.5-325 MG tablet Take 1-2 tablets by mouth every 4 (four) hours as needed for moderate pain. 100 tablet 0  . levothyroxine (SYNTHROID) 125 MCG tablet TAKE 1 TABLET (125 MCG TOTAL) BY MOUTH DAILY BEFORE BREAKFAST. 90 tablet 0  . lidocaine (LIDODERM) 5 % Place 1 patch onto the skin daily. Remove & Discard patch within 12 hours or as directed by MD 30 patch 0  . meclizine (ANTIVERT) 25 MG tablet Take 25 mg by mouth 3 (three) times daily as needed for dizziness.    . mesalamine (LIALDA) 1.2 G EC tablet Take 1 tablet (1.2 g total) by mouth 3 (three) times daily. (Patient taking differently: Take 1.2 g by mouth at bedtime. ) 270 tablet 1  . mupirocin ointment (BACTROBAN) 2 % Place 1 application into the nose 2 (two) times daily as needed (irritation).     . Neomycin-Bacitracin-Polymyxin (NEOSPORIN EX) Apply 1 application topically daily as needed (skin tears). USING DAILY TO RIGHT HAND SKIN TEAR    . nitroGLYCERIN (NITROSTAT) 0.4 MG SL tablet Place 1 tablet (0.4 mg total) under the tongue every 5 (five) minutes as needed for chest pain. 30 tablet 1  . ondansetron (ZOFRAN) 8 MG tablet Take 1 tablet (8 mg total) by mouth every 8 (eight) hours as needed for nausea or vomiting. 20 tablet 3  . oxyCODONE (OXY IR/ROXICODONE) 5 MG immediate release tablet Take 2.5 mg by mouth every 4 (four) hours as needed. Reported on 08/03/2015  0  .  pantoprazole (PROTONIX) 40 MG tablet TAKE 1 TABLET BY MOUTH EVERY MORNING (Patient taking differently: TAKE 1 TABLET BY MOUTH EVERY evening) 90 tablet 0  . polyethylene glycol (MIRALAX / GLYCOLAX) packet Take 17 g by mouth 2 (two) times daily. (Patient taking differently: Take 17 g by mouth daily as needed for moderate constipation. ) 14 each 0  . simvastatin (ZOCOR) 10 MG tablet TAKE 1 TABLET (10 MG TOTAL) BY MOUTH EVERY OTHER DAY **DISCONTINUE CRESTOR AND ZETIA** 45 tablet 0   No current facility-administered medications on file prior to visit.     BP 110/60 mmHg  Pulse 85  Temp(Src) 97.4 F (36.3 C) (Oral)  Ht '5\' 11"'$  (1.803 m)  Wt   SpO2 97%       Objective:   Physical Exam  General  Mental Status- Alert. Pleasant pt.  Skin General:- Normal. Moisture- Dry. Temperature- Warm.  HEENT Head- normal.  Neck Neck- Supple.  Heart Ausculation-RRR  Lungs Ausculation- Clear, even, unlabored bilaterlly.    Abdome Palpation/Percussion: Palpation and Percussion of the abdomen reveal- faint suprapubic  Tender, No Rebound tenderness, No Rigidity(guarding), No Palpable abdominal masses and No jar tenderness. No suprapubic tenderness. Liver:-Normal. Spleen:- Normal. Other Characteristics- No Costovertebral angle tenderness- Left or Costovertebral angle tenderness- Right.  Auscultation: Auscultation of the abdomen reveals- Bowel Sounds normal.      Assessment & Plan:   For follow up from hospital uti and pneumonia. Will get cbc, cmp, cxr today.  Urine dip today and due to recent history of uti in hospital will get culture.  For anxiety refilled xanax.  For dementia history refilled aricept.  Follow up in 3-4 weeks pcp or as needed  Refilled her celebrex today to use only 1 tab a day if not much pain.

## 2015-08-14 NOTE — Telephone Encounter (Signed)
Rx's sent to the pharmacy by e-script.  Pt requested Keflex which was denied.//AB/CMA

## 2015-08-14 NOTE — Patient Instructions (Addendum)
For follow up from hospital uti and pneumonia. Will get cbc, cmp, cxr today.  Urine dip today and due to recent history of uti in hospital will get culture.  For anxiety refilled xanax.  For dementia history refilled aricept.  If any recurrent pneumonia or uti symptoms notify us. Will up date you on labs when those are back.  Follow up in 3-4 weeks pcp or as needed

## 2015-08-15 LAB — COMPREHENSIVE METABOLIC PANEL
ALBUMIN: 3.7 g/dL (ref 3.5–5.2)
ALT: 15 U/L (ref 0–35)
AST: 17 U/L (ref 0–37)
Alkaline Phosphatase: 123 U/L — ABNORMAL HIGH (ref 39–117)
BUN: 18 mg/dL (ref 6–23)
CHLORIDE: 101 meq/L (ref 96–112)
CO2: 27 meq/L (ref 19–32)
CREATININE: 0.93 mg/dL (ref 0.40–1.20)
Calcium: 10.1 mg/dL (ref 8.4–10.5)
GFR: 61.24 mL/min (ref >60.00)
GLUCOSE: 100 mg/dL — AB (ref 70–99)
POTASSIUM: 3.7 meq/L (ref 3.5–5.1)
SODIUM: 136 meq/L (ref 135–145)
Total Bilirubin: 0.4 mg/dL (ref 0.2–1.2)
Total Protein: 6.9 g/dL (ref 6.0–8.3)

## 2015-08-15 LAB — CBC WITH DIFFERENTIAL/PLATELET
BASOS PCT: 0.4 % (ref 0.0–3.0)
Basophils Absolute: 0 10*3/uL (ref 0.0–0.1)
EOS ABS: 0.2 10*3/uL (ref 0.0–0.7)
Eosinophils Relative: 2.6 % (ref 0.0–5.0)
HCT: 35.3 % — ABNORMAL LOW (ref 36.0–46.0)
HEMOGLOBIN: 11.4 g/dL — AB (ref 12.0–15.0)
LYMPHS ABS: 1.5 10*3/uL (ref 0.7–4.0)
Lymphocytes Relative: 15.6 % (ref 12.0–46.0)
MCHC: 32.5 g/dL (ref 30.0–36.0)
MCV: 83.5 fl (ref 78.0–100.0)
MONO ABS: 0.6 10*3/uL (ref 0.1–1.0)
Monocytes Relative: 6.8 % (ref 3.0–12.0)
NEUTROS PCT: 74.6 % (ref 43.0–77.0)
Neutro Abs: 7.1 10*3/uL (ref 1.4–7.7)
Platelets: 448 10*3/uL — ABNORMAL HIGH (ref 150.0–400.0)
RBC: 4.23 Mil/uL (ref 3.87–5.11)
RDW: 22.3 % — AB (ref 11.5–15.5)
WBC: 9.5 10*3/uL (ref 4.0–10.5)

## 2015-08-15 LAB — POC URINALSYSI DIPSTICK (AUTOMATED)
BILIRUBIN UA: NEGATIVE
GLUCOSE UA: NEGATIVE
KETONES UA: NEGATIVE
Nitrite, UA: NEGATIVE
PROTEIN UA: NEGATIVE
SPEC GRAV UA: 1.025
Urobilinogen, UA: 0.2
pH, UA: 6

## 2015-08-15 MED ORDER — ALPRAZOLAM 0.25 MG PO TABS: 0.1250 mg | ORAL_TABLET | Freq: Two times a day (BID) | ORAL | Status: DC | PRN

## 2015-08-15 MED ORDER — LEVOFLOXACIN 500 MG PO TABS: 500.0000 mg | ORAL_TABLET | Freq: Every day | ORAL | Status: DC

## 2015-08-15 MED FILL — levoFLOXacin 500 MG TABS: 500 | 7 days supply | Qty: 7 | Fill #0

## 2015-08-15 MED FILL — ALPRAZolam 0.25 MG TABS: 0.25 | 30 days supply | Qty: 30 | Fill #0

## 2015-08-15 NOTE — Addendum Note (Signed)
Addended by: Tasia Catchings on: 08/15/2015 10:28 AM   Modules accepted: Orders

## 2015-08-15 NOTE — Addendum Note (Signed)
Addended by: Tasia Catchings on: 08/15/2015 11:38 AM   Modules accepted: Orders

## 2015-08-15 NOTE — Telephone Encounter (Signed)
Talked with daughter. With xray findings of increased infiltrate will rx levofloxin. She was recent hospitalized for both pneumonia and uti. Some suprapubic pressure on exam. UA and culture is pending. Follow up in 7 days and will repeat xray. For time being explained to stop cephalexin while on antibiotic.  Also today gave xanax rx. Tried to print yesterday but epic states no print.  And family member states I did not give them rx.

## 2015-08-17 LAB — URINE CULTURE: Colony Count: >=100000

## 2015-08-18 ENCOUNTER — Emergency Department (HOSPITAL_COMMUNITY): Payer: Medicare Other

## 2015-08-18 ENCOUNTER — Inpatient Hospital Stay (HOSPITAL_COMMUNITY)
Admission: EM | Admit: 2015-08-18 | Discharge: 2015-08-22 | DRG: 871 | Disposition: A | Discharge status: Home Health | Payer: Medicare Other | Attending: Internal Medicine | Admitting: Internal Medicine

## 2015-08-18 ENCOUNTER — Telehealth: Payer: Self-pay | Admitting: Physician Assistant

## 2015-08-18 ENCOUNTER — Ambulatory Visit: Payer: Medicare Other | Admitting: Physician Assistant

## 2015-08-18 ENCOUNTER — Encounter (HOSPITAL_COMMUNITY): Payer: Self-pay | Admitting: *Deleted

## 2015-08-18 DIAGNOSIS — Z79899 Other long term (current) drug therapy: Secondary | ICD-10-CM

## 2015-08-18 DIAGNOSIS — C3492 Malignant neoplasm of unspecified part of left bronchus or lung: Secondary | ICD-10-CM

## 2015-08-18 DIAGNOSIS — F32A Depression, unspecified: Secondary | ICD-10-CM | POA: Diagnosis present

## 2015-08-18 DIAGNOSIS — F419 Anxiety disorder, unspecified: Secondary | ICD-10-CM | POA: Diagnosis present

## 2015-08-18 DIAGNOSIS — Z8673 Personal history of transient ischemic attack (TIA), and cerebral infarction without residual deficits: Secondary | ICD-10-CM

## 2015-08-18 DIAGNOSIS — Z923 Personal history of irradiation: Secondary | ICD-10-CM | POA: Diagnosis not present

## 2015-08-18 DIAGNOSIS — R0600 Dyspnea, unspecified: Secondary | ICD-10-CM | POA: Diagnosis not present

## 2015-08-18 DIAGNOSIS — E876 Hypokalemia: Secondary | ICD-10-CM | POA: Diagnosis present

## 2015-08-18 DIAGNOSIS — Z7982 Long term (current) use of aspirin: Secondary | ICD-10-CM | POA: Diagnosis not present

## 2015-08-18 DIAGNOSIS — J440 Chronic obstructive pulmonary disease with acute lower respiratory infection: Secondary | ICD-10-CM | POA: Diagnosis present

## 2015-08-18 DIAGNOSIS — C3490 Malignant neoplasm of unspecified part of unspecified bronchus or lung: Secondary | ICD-10-CM | POA: Diagnosis not present

## 2015-08-18 DIAGNOSIS — I959 Hypotension, unspecified: Secondary | ICD-10-CM | POA: Diagnosis present

## 2015-08-18 DIAGNOSIS — Z885 Allergy status to narcotic agent status: Secondary | ICD-10-CM | POA: Diagnosis not present

## 2015-08-18 DIAGNOSIS — Z880 Allergy status to penicillin: Secondary | ICD-10-CM | POA: Diagnosis not present

## 2015-08-18 DIAGNOSIS — C3432 Malignant neoplasm of lower lobe, left bronchus or lung: Secondary | ICD-10-CM | POA: Diagnosis present

## 2015-08-18 DIAGNOSIS — Y95 Nosocomial condition: Secondary | ICD-10-CM | POA: Diagnosis present

## 2015-08-18 DIAGNOSIS — Z803 Family history of malignant neoplasm of breast: Secondary | ICD-10-CM | POA: Diagnosis not present

## 2015-08-18 DIAGNOSIS — J438 Other emphysema: Secondary | ICD-10-CM | POA: Diagnosis not present

## 2015-08-18 DIAGNOSIS — J449 Chronic obstructive pulmonary disease, unspecified: Secondary | ICD-10-CM | POA: Diagnosis present

## 2015-08-18 DIAGNOSIS — F0390 Unspecified dementia without behavioral disturbance: Secondary | ICD-10-CM | POA: Diagnosis present

## 2015-08-18 DIAGNOSIS — Z881 Allergy status to other antibiotic agents status: Secondary | ICD-10-CM | POA: Diagnosis not present

## 2015-08-18 DIAGNOSIS — D509 Iron deficiency anemia, unspecified: Secondary | ICD-10-CM | POA: Diagnosis present

## 2015-08-18 DIAGNOSIS — N390 Urinary tract infection, site not specified: Secondary | ICD-10-CM | POA: Diagnosis present

## 2015-08-18 DIAGNOSIS — F418 Other specified anxiety disorders: Secondary | ICD-10-CM | POA: Diagnosis not present

## 2015-08-18 DIAGNOSIS — A419 Sepsis, unspecified organism: Secondary | ICD-10-CM | POA: Diagnosis present

## 2015-08-18 DIAGNOSIS — Z7902 Long term (current) use of antithrombotics/antiplatelets: Secondary | ICD-10-CM

## 2015-08-18 DIAGNOSIS — I10 Essential (primary) hypertension: Secondary | ICD-10-CM

## 2015-08-18 DIAGNOSIS — D7589 Other specified diseases of blood and blood-forming organs: Secondary | ICD-10-CM | POA: Diagnosis present

## 2015-08-18 DIAGNOSIS — F329 Major depressive disorder, single episode, unspecified: Secondary | ICD-10-CM | POA: Diagnosis present

## 2015-08-18 DIAGNOSIS — R079 Chest pain, unspecified: Secondary | ICD-10-CM

## 2015-08-18 DIAGNOSIS — E785 Hyperlipidemia, unspecified: Secondary | ICD-10-CM | POA: Diagnosis present

## 2015-08-18 DIAGNOSIS — J189 Pneumonia, unspecified organism: Secondary | ICD-10-CM | POA: Diagnosis present

## 2015-08-18 DIAGNOSIS — B961 Klebsiella pneumoniae [K. pneumoniae] as the cause of diseases classified elsewhere: Secondary | ICD-10-CM | POA: Diagnosis present

## 2015-08-18 DIAGNOSIS — Z66 Do not resuscitate: Secondary | ICD-10-CM | POA: Diagnosis present

## 2015-08-18 DIAGNOSIS — Z87891 Personal history of nicotine dependence: Secondary | ICD-10-CM

## 2015-08-18 DIAGNOSIS — E872 Acidosis: Secondary | ICD-10-CM | POA: Diagnosis present

## 2015-08-18 LAB — CBC WITH DIFFERENTIAL/PLATELET
BASOS ABS: 0.1 10*3/uL (ref 0.0–0.1)
Basophils Relative: 1 %
EOS ABS: 0.2 10*3/uL (ref 0.0–0.7)
EOS PCT: 3 %
HCT: 36.2 % (ref 36.0–46.0)
HEMOGLOBIN: 11.2 g/dL — AB (ref 12.0–15.0)
LYMPHS ABS: 1.1 10*3/uL (ref 0.7–4.0)
Lymphocytes Relative: 16 %
MCH: 27.5 pg (ref 26.0–34.0)
MCHC: 30.9 g/dL (ref 30.0–36.0)
MCV: 88.7 fL (ref 78.0–100.0)
Monocytes Absolute: 0.6 10*3/uL (ref 0.1–1.0)
Monocytes Relative: 9 %
NEUTROS PCT: 71 %
Neutro Abs: 4.8 10*3/uL (ref 1.7–7.7)
PLATELETS: 389 10*3/uL (ref 150–400)
RBC: 4.08 MIL/uL (ref 3.87–5.11)
RDW: 19.3 % — ABNORMAL HIGH (ref 11.5–15.5)
WBC: 6.8 10*3/uL (ref 4.0–10.5)

## 2015-08-18 LAB — URINALYSIS, ROUTINE W REFLEX MICROSCOPIC
Bilirubin Urine: NEGATIVE
Glucose, UA: NEGATIVE mg/dL
HGB URINE DIPSTICK: NEGATIVE
Ketones, ur: NEGATIVE mg/dL
LEUKOCYTES UA: NEGATIVE
NITRITE: NEGATIVE
PROTEIN: NEGATIVE mg/dL
SPECIFIC GRAVITY, URINE: 1.015 (ref 1.005–1.030)
pH: 7 (ref 5.0–8.0)

## 2015-08-18 LAB — COMPREHENSIVE METABOLIC PANEL
ALT: 13 U/L — ABNORMAL LOW (ref 14–54)
AST: 23 U/L (ref 15–41)
Albumin: 3.3 g/dL — ABNORMAL LOW (ref 3.5–5.0)
Alkaline Phosphatase: 112 U/L (ref 38–126)
Anion gap: 10 (ref 5–15)
BILIRUBIN TOTAL: 0.7 mg/dL (ref 0.3–1.2)
BUN: 13 mg/dL (ref 6–20)
CHLORIDE: 107 mmol/L (ref 101–111)
CO2: 24 mmol/L (ref 22–32)
CREATININE: 0.81 mg/dL (ref 0.44–1.00)
Calcium: 10 mg/dL (ref 8.9–10.3)
Glucose, Bld: 104 mg/dL — ABNORMAL HIGH (ref 65–99)
POTASSIUM: 3.8 mmol/L (ref 3.5–5.1)
Sodium: 141 mmol/L (ref 135–145)
TOTAL PROTEIN: 7.1 g/dL (ref 6.5–8.1)

## 2015-08-18 LAB — BRAIN NATRIURETIC PEPTIDE: B NATRIURETIC PEPTIDE 5: 103.6 pg/mL — AB (ref 0.0–100.0)

## 2015-08-18 LAB — PROCALCITONIN: Procalcitonin: <0.1 ng/mL

## 2015-08-18 LAB — LACTIC ACID, PLASMA
LACTIC ACID, VENOUS: 2.9 mmol/L — AB (ref 0.5–2.0)
Lactic Acid, Venous: 2.1 mmol/L (ref 0.5–2.0)

## 2015-08-18 LAB — I-STAT CG4 LACTIC ACID, ED: LACTIC ACID, VENOUS: 3.22 mmol/L — AB (ref 0.5–2.0)

## 2015-08-18 LAB — INFLUENZA PANEL BY PCR (TYPE A & B)
H1N1 flu by pcr: NOT DETECTED
Influenza A By PCR: NEGATIVE
Influenza B By PCR: NEGATIVE

## 2015-08-18 LAB — TROPONIN I

## 2015-08-18 LAB — STREP PNEUMONIAE URINARY ANTIGEN: Strep Pneumo Urinary Antigen: NEGATIVE

## 2015-08-18 MED ORDER — PANTOPRAZOLE SODIUM 40 MG PO TBEC
40.0000 mg | DELAYED_RELEASE_TABLET | Freq: Every evening | ORAL | Status: DC
  Administered 2015-08-18 – 2015-08-21 (×4): 40 mg via ORAL
  Filled 2015-08-18 (×4): qty 1

## 2015-08-18 MED ORDER — ACETAMINOPHEN 650 MG RE SUPP: 650.0000 mg | Freq: Four times a day (QID) | RECTAL | Status: DC | PRN

## 2015-08-18 MED ORDER — MESALAMINE 1.2 G PO TBEC
1.2000 g | DELAYED_RELEASE_TABLET | Freq: Every day | ORAL | Status: DC
  Administered 2015-08-20 – 2015-08-22 (×3): 1.2 g via ORAL
  Filled 2015-08-18 (×4): qty 1

## 2015-08-18 MED ORDER — VANCOMYCIN HCL IN DEXTROSE 750-5 MG/150ML-% IV SOLN
750.0000 mg | Freq: Two times a day (BID) | INTRAVENOUS | Status: DC
  Administered 2015-08-19 – 2015-08-20 (×3): 750 mg via INTRAVENOUS
  Filled 2015-08-18 (×5): qty 150

## 2015-08-18 MED ORDER — ALPRAZOLAM 0.25 MG PO TABS
0.1250 mg | ORAL_TABLET | Freq: Two times a day (BID) | ORAL | Status: DC | PRN
  Administered 2015-08-18 – 2015-08-21 (×4): 0.125 mg via ORAL
  Filled 2015-08-18 (×4): qty 1

## 2015-08-18 MED ORDER — CLOPIDOGREL BISULFATE 75 MG PO TABS
75.0000 mg | ORAL_TABLET | Freq: Every day | ORAL | Status: DC
  Administered 2015-08-19 – 2015-08-22 (×4): 75 mg via ORAL
  Filled 2015-08-18 (×5): qty 1

## 2015-08-18 MED ORDER — AMLODIPINE BESYLATE 5 MG PO TABS
2.5000 mg | ORAL_TABLET | Freq: Every day | ORAL | Status: DC
  Administered 2015-08-19 – 2015-08-22 (×4): 2.5 mg via ORAL
  Filled 2015-08-18 (×6): qty 1

## 2015-08-18 MED ORDER — ONDANSETRON HCL 4 MG PO TABS
4.0000 mg | ORAL_TABLET | Freq: Four times a day (QID) | ORAL | Status: DC | PRN
  Administered 2015-08-20: 4 mg via ORAL
  Filled 2015-08-18 (×2): qty 1

## 2015-08-18 MED ORDER — ESCITALOPRAM OXALATE 10 MG PO TABS
10.0000 mg | ORAL_TABLET | Freq: Every day | ORAL | Status: DC
  Administered 2015-08-19 – 2015-08-22 (×4): 10 mg via ORAL
  Filled 2015-08-18 (×5): qty 1

## 2015-08-18 MED ORDER — AMITRIPTYLINE HCL 25 MG PO TABS
25.0000 mg | ORAL_TABLET | Freq: Every day | ORAL | Status: DC
  Administered 2015-08-18 – 2015-08-21 (×4): 25 mg via ORAL
  Filled 2015-08-18 (×4): qty 1

## 2015-08-18 MED ORDER — GABAPENTIN 100 MG PO CAPS: 100.0000 mg | ORAL_CAPSULE | Freq: Two times a day (BID) | ORAL | Status: DC | PRN

## 2015-08-18 MED ORDER — DEXTROSE 5 % IV SOLN
1.0000 g | Freq: Three times a day (TID) | INTRAVENOUS | Status: DC
  Administered 2015-08-18 – 2015-08-20 (×6): 1 g via INTRAVENOUS
  Filled 2015-08-18 (×8): qty 1

## 2015-08-18 MED ORDER — FLUTICASONE FUROATE-VILANTEROL 100-25 MCG/INH IN AEPB
1.0000 | INHALATION_SPRAY | Freq: Every day | RESPIRATORY_TRACT | Status: DC
  Administered 2015-08-21 – 2015-08-22 (×2): 1 via RESPIRATORY_TRACT
  Filled 2015-08-18: qty 28

## 2015-08-18 MED ORDER — HYDRALAZINE HCL 20 MG/ML IJ SOLN
10.0000 mg | Freq: Once | INTRAMUSCULAR | Status: AC
  Administered 2015-08-18: 10 mg via INTRAVENOUS
  Filled 2015-08-18: qty 1

## 2015-08-18 MED ORDER — DONEPEZIL HCL 5 MG PO TABS
15.0000 mg | ORAL_TABLET | Freq: Every day | ORAL | Status: DC
  Administered 2015-08-18 – 2015-08-21 (×4): 15 mg via ORAL
  Filled 2015-08-18 (×4): qty 3

## 2015-08-18 MED ORDER — SIMVASTATIN 10 MG PO TABS
10.0000 mg | ORAL_TABLET | Freq: Every day | ORAL | Status: DC
  Administered 2015-08-19 – 2015-08-21 (×3): 10 mg via ORAL
  Filled 2015-08-18 (×3): qty 1

## 2015-08-18 MED ORDER — IOHEXOL 350 MG/ML SOLN
100.0000 mL | Freq: Once | INTRAVENOUS | Status: AC | PRN
  Administered 2015-08-18: 72 mL via INTRAVENOUS

## 2015-08-18 MED ORDER — SODIUM CHLORIDE 0.9% FLUSH
3.0000 mL | Freq: Two times a day (BID) | INTRAVENOUS | Status: DC
  Administered 2015-08-19 – 2015-08-21 (×6): 3 mL via INTRAVENOUS

## 2015-08-18 MED ORDER — FERROUS SULFATE 325 (65 FE) MG PO TABS
325.0000 mg | ORAL_TABLET | Freq: Every day | ORAL | Status: DC
  Administered 2015-08-19 – 2015-08-22 (×4): 325 mg via ORAL
  Filled 2015-08-18 (×5): qty 1

## 2015-08-18 MED ORDER — SODIUM CHLORIDE 0.9 % IV BOLUS (SEPSIS)
1000.0000 mL | INTRAVENOUS | Status: AC
  Administered 2015-08-18 (×2): 1000 mL via INTRAVENOUS

## 2015-08-18 MED ORDER — ASPIRIN EC 81 MG PO TBEC
81.0000 mg | DELAYED_RELEASE_TABLET | Freq: Every day | ORAL | Status: DC
  Administered 2015-08-19 – 2015-08-22 (×4): 81 mg via ORAL
  Filled 2015-08-18 (×5): qty 1

## 2015-08-18 MED ORDER — CARVEDILOL 6.25 MG PO TABS
6.2500 mg | ORAL_TABLET | Freq: Two times a day (BID) | ORAL | Status: DC
  Administered 2015-08-19 – 2015-08-22 (×7): 6.25 mg via ORAL
  Filled 2015-08-18 (×9): qty 1

## 2015-08-18 MED ORDER — DOCUSATE SODIUM 100 MG PO CAPS: 100.0000 mg | ORAL_CAPSULE | Freq: Every day | ORAL | Status: DC | PRN

## 2015-08-18 MED ORDER — VANCOMYCIN HCL IN DEXTROSE 1-5 GM/200ML-% IV SOLN
1000.0000 mg | Freq: Once | INTRAVENOUS | Status: AC
  Administered 2015-08-18: 1000 mg via INTRAVENOUS
  Filled 2015-08-18: qty 200

## 2015-08-18 MED ORDER — TIZANIDINE HCL 4 MG PO TABS
4.0000 mg | ORAL_TABLET | Freq: Four times a day (QID) | ORAL | Status: DC | PRN
  Filled 2015-08-18: qty 1

## 2015-08-18 MED ORDER — SODIUM CHLORIDE 0.9 % IV BOLUS (SEPSIS)
500.0000 mL | INTRAVENOUS | Status: AC
  Administered 2015-08-18: 500 mL via INTRAVENOUS

## 2015-08-18 MED ORDER — ACETAMINOPHEN 325 MG PO TABS
325.0000 mg | ORAL_TABLET | Freq: Four times a day (QID) | ORAL | Status: DC | PRN
  Administered 2015-08-18 – 2015-08-19 (×2): 325 mg via ORAL
  Filled 2015-08-18 (×2): qty 1

## 2015-08-18 MED ORDER — LEVOTHYROXINE SODIUM 125 MCG PO TABS
125.0000 ug | ORAL_TABLET | Freq: Every day | ORAL | Status: DC
  Administered 2015-08-19 – 2015-08-22 (×4): 125 ug via ORAL
  Filled 2015-08-18 (×5): qty 1

## 2015-08-18 MED ORDER — ONDANSETRON HCL 4 MG/2ML IJ SOLN
4.0000 mg | Freq: Four times a day (QID) | INTRAMUSCULAR | Status: DC | PRN
  Administered 2015-08-19 – 2015-08-21 (×2): 4 mg via INTRAVENOUS
  Filled 2015-08-18 (×2): qty 2

## 2015-08-18 MED ORDER — SODIUM CHLORIDE 0.9 % IV SOLN
INTRAVENOUS | Status: DC
  Administered 2015-08-18: 50 mL/h via INTRAVENOUS

## 2015-08-18 MED ORDER — DEXTROSE 5 % IV SOLN
2.0000 g | Freq: Once | INTRAVENOUS | Status: AC
  Administered 2015-08-18: 2 g via INTRAVENOUS
  Filled 2015-08-18: qty 2

## 2015-08-18 MED ORDER — DEXTROSE 5 % IV SOLN: 2.0000 g | Freq: Three times a day (TID) | INTRAVENOUS | Status: DC

## 2015-08-18 MED ORDER — OXYCODONE HCL 5 MG PO TABS: 2.5000 mg | ORAL_TABLET | ORAL | Status: DC | PRN

## 2015-08-18 MED ORDER — ACETAMINOPHEN 325 MG PO TABS
650.0000 mg | ORAL_TABLET | Freq: Four times a day (QID) | ORAL | Status: DC | PRN
  Administered 2015-08-18 – 2015-08-21 (×2): 650 mg via ORAL
  Filled 2015-08-18 (×2): qty 2

## 2015-08-18 MED ORDER — CLONIDINE HCL 0.1 MG PO TABS
0.1000 mg | ORAL_TABLET | Freq: Every day | ORAL | Status: DC | PRN
  Administered 2015-08-18 – 2015-08-19 (×2): 0.1 mg via ORAL
  Filled 2015-08-18 (×2): qty 1

## 2015-08-18 MED ORDER — CELECOXIB 100 MG PO CAPS
100.0000 mg | ORAL_CAPSULE | Freq: Two times a day (BID) | ORAL | Status: DC | PRN
  Filled 2015-08-18: qty 1

## 2015-08-18 NOTE — ED Notes (Signed)
Critical lactic acid 2.9 reported to [facility] and [facility] [employer].

## 2015-08-18 NOTE — Progress Notes (Signed)
Pharmacy Antibiotic Follow-up Note  Jodi Frazier is a 82 y.o. year-old female admitted on 08/18/2015.  The patient is currently on day 1 of Vancomycin & Aztreonam for HAP.  Assessment/Plan: Vancomycin 1gm in ED, followed by '750mg'$  q12, trough goal 15-20 mcg/ml Aztreonam 2gm x1 in ED, followed by 1gm q8hr  Temp (24hrs), Avg:97.7 F (36.5 C), Min:97.7 F (36.5 C), Max:97.7 F (36.5 C)   Recent Labs Lab 08/14/15 1637  WBC 9.5    Recent Labs Lab 08/14/15 1637  CREATININE 0.93   CrCl cannot be calculated (Unknown ideal weight.).    Allergies  Allergen Reactions  . Codeine Hives  . Augmentin [Amoxicillin-Pot Clavulanate] Diarrhea and Nausea And Vomiting  . Penicillins Hives    Has patient had a PCN reaction causing immediate rash, facial/tongue/throat swelling, SOB or lightheadedness with hypotension: No Has patient had a PCN reaction causing severe rash involving mucus membranes or skin necrosis: Hives  Has patient had a PCN reaction that required hospitalization: No Has patient had a PCN reaction occurring within the last 10 years: No    Antimicrobials this admission: 2/17 Vancomycin  >>  2/17  Aztreonam >>  Levels/dose changes this admission:   Microbiology results: 2/14 UCx: Kleb pna resistant to Ampicillin 217 BCx: sent   Thank you for allowing pharmacy to be a part of this patient's care.  Minda Ditto PharmD Pager [phone] 08/18/2015, 11:40 AM

## 2015-08-18 NOTE — Progress Notes (Signed)
Utilization Review completed.  Zaron Zwiefelhofer RN CM  

## 2015-08-18 NOTE — Progress Notes (Signed)
Pt and her daughter both state that pt wants to be a "DNR". Daughter states they forgot to tell Dr. Charlies Silvers. Informed pt's rn Elaina Hoops of this. Lucius Conn BSN, RN-BC Admissions RN  08/18/2015 4:19 PM

## 2015-08-18 NOTE — Clinical Social Work Note (Signed)
Clinical Social Work Assessment  Patient Details  Name: Jodi Frazier MRN: [id number] Date of Birth: Nov 14, 1930  Date of referral:  08/18/15               Reason for consult:   (Patient is interested in SNF.)                Permission sought to share information with:   (None.) Permission granted to share information::  No  Name::        Agency::     Relationship::     Contact Information:     Housing/Transportation Living arrangements for the past 2 months:  Single Family Home (Patient lives in [place] with her daughter.) Source of Information:  Patient, Adult Children Patient Interpreter Needed:  None Criminal Activity/Legal Involvement Pertinent to Current Situation/Hospitalization:  No - Comment as needed Significant Relationships:  Adult Children Lives with:  Self Do you feel safe going back to the place where you live?   (Patient is interested in SNF.) Need for family participation in patient care:   (Patient has good family support.)  Care giving concerns:  Patient and daughter are interested in [facility] facility for rehab. CSW provided patient with list. CSW informed patient and family that there can be no guarantee of a specific facility.   Social Worker assessment / plan:  CSW met with patient at bedside. Daughter was present. Patient confirms that she comes from home. Also, patient confirms that she presents to [facility] due to SOB and chest pain.  Patient states that she has a good support system. Patient informed CSW that she has fainted x2 within the last month. Patient states that she ambulates using a walker. Daughter states January 25 patient had a L hip replacement.   Employment status:  Retired Forensic scientist:  Medicare PT Recommendations:  Not assessed at this time Information / Referral to community resources:   (CSW provided patient with a list of SNF.)  Patient/Family's Response to care:  Patient is aware of admission and is accepting at this  time.  Patient/Family's Understanding of and Emotional Response to Diagnosis, Current Treatment, and Prognosis:  Patient and daughter state that they do not have any questions for CSW at this time.  Emotional Assessment Appearance:  Appears older than stated age Attitude/Demeanor/Rapport:   (Appropriate.) Affect (typically observed):  Accepting, Appropriate Orientation:  Oriented to Self, Oriented to Place, Oriented to  Time, Oriented to Situation Alcohol / Substance use:  Not Applicable Psych involvement (Current and /or in the community):  No (Comment)  Discharge Needs  Concerns to be addressed:  Adjustment to Illness Readmission within the last 30 days:  No Current discharge risk:  None Barriers to Discharge:  No Barriers Identified   Bernita Buffy, LCSW 08/18/2015, 6:29 PM

## 2015-08-18 NOTE — ED Notes (Signed)
Patient unable to provide a urine sample at this time, patient will notify staff when able

## 2015-08-18 NOTE — ED Notes (Signed)
Pt recently released from hospital from hip replacement, pneumonia and UTI. Symptoms are worse today, PMD appointment at 1545, needs to be seen before afternoon, History of lung ca

## 2015-08-18 NOTE — ED Notes (Signed)
Patient transported to CT 

## 2015-08-18 NOTE — Telephone Encounter (Signed)
Pt's daughter Yurani called in at 10:30 to inform office that she is going to take pt to Urgent Care instead so she would like to cancel appt.

## 2015-08-18 NOTE — ED Provider Notes (Signed)
CSN: [id number]     Arrival date & time 08/18/15  1058 History   First MD Initiated Contact with Patient 08/18/15 1137     Chief Complaint  Patient presents with  . Shortness of Breath  . Chest Pain     (Consider location/radiation/quality/duration/timing/severity/associated sxs/prior Treatment) HPI Patient was hospitalized with a diagnosis of pneumonia after hip replacement 2-1/2 weeks ago. At her initial presentation she had mental status change and was treated with Rocephin and Zithromax. Patient subsequently got discharged and 4 days ago reportedly had the pneumonia back again. She was started on Levaquin which she has taken for the past 4 days without any improvement. She continues to cough and chest pain. She also has shortness of breath. Chest pain has been migrating somewhat and is in her lower chest. It's worse with deep breath. She denies she's been having calf pain or swelling. The patient has a history of lung cancer that although not specifically in remission, has been stable for 2 years. Today she was more short of breath and had increased chest pain. Patient also had nausea and dry heaves. She felt extremely weak and unwell. Past Medical History  Diagnosis Date  . Hypertension   . Colitis   . Coronary artery disease   . High cholesterol     takes Red Yeast Rice and Fish OIl daily  . Kidney stones   . PONV (postoperative nausea and vomiting)   . COPD (chronic obstructive pulmonary disease) (HCC)     pleurisy or COPD exascerbation > 47yrago  . Headache(784.0)   . Vertigo     HTN related bc gets up too fast  . Arthritis   . Bruises easily   . History of shingles 14yrago    in eye;occasionally gets some residual   . Accessory skin tags     arms/legs  . GERD (gastroesophageal reflux disease)   . Colitis   . Diverticulosis   . Urinary frequency   . Stress incontinence   . History of kidney stones   . Hypothyroidism     takes sYnthroid daily  . Cataract     early  stage on right  . Depression     some but takes Amitryptylline nightly  . Right-sided chest wall pain 08/28/12    PRESENTATION - RIGHT SIDED ANTERIOR CHEST PAIN  . Weight loss 08/28/12    10 LB WEIGHT LOSS OVER 3 MONTHS  . Lung mass 08/12/12    CHEST-XRAY/ PET -LOBULAR MASS LLL - 4.1 X 3.6 X 4.2 CM  . S/P radiation therapy  10/05/2012-11/20/2012    Left Lower Lung and hilum / 70 Gy in 35 fractions  . Hypertension   . At risk for fall due to comorbid condition     5 falls - last Early December  . Vertigo   . Orthostatic hypotension   . MI [place] infarction) ([facility]  . [place]roke ([facility]    mild 2014, no deficits  . Shortness of breath     with exertion  . Pneumonia     hx of 20+yrs ago  . Macular degeneration, dry     left eye  . Avascular necrosis of bone of left hip ([facility]  . Skin tear of hand without complication     RIGHT HAND FOR LAST 10 DAYS USRING DRESSING AND NEOSPORIN TO  . Adenocarcinoma of lung ([facility]    needle core bx-LLL-adenocarcinoma   Past Surgical History  Procedure Laterality Date  .  Hip arthroscopy  20yrago    right hip-replacement  . Knee arthroscopy      LT  . Total knee arthroplasty  89yrago    left  . Appendectomy  [id number]  . Lithotripsy  2011  . Cataract surgery  2012    left  . Eye surgery  [id number]  . Bladder surgery  2001    tacked  . Colonoscopy    . Esophagogastroduodenoscopy    . Total knee arthroplasty  09/09/2011    Procedure: TOTAL KNEE ARTHROPLASTY;  Surgeon: StepRudean Haskell;  [facility];  Laterality: Right;  Right Total Knee Arthroplasty  . Bronchial brush biopsy Left 08/17/12    LLL Bronchial Washing / Brushing and Bronchial Biopsy: Negative for malignancy  . Left lower lobe needle core biopsy Left 09/07/12    Poorly Differentiated Adenocarcinoma   . Joint replacement    . Stents to heart  2010    x 2 to right rca  . Coronary angioplasty with stent placement  2010    2 in rt coronary  artery and 1 in another spot  . Abdominal hysterectomy  28yr80yr     complete  . Right knee replacement  2013  . Total hip arthroplasty Left 07/25/2015    Procedure: LEFT TOTAL HIP ARTHROPLASTY ANTERIOR APPROACH;  Surgeon: MatthParalee Cancel  Location: [facility];  Service: Orthopedics;  Laterality: Left;   Family History  Problem Relation Age of Onset  . Anesthesia problems Mother   . Hypotension Neg Hx   . Malignant hyperthermia Neg Hx   . Pseudochol deficiency Neg Hx   . Cancer Maternal Aunt     breast   Social History  Substance Use Topics  . Smoking status: Former Smoker -- 1.00 packs/day for 40 years    Types: Cigarettes    Quit date: 08/26/2002  . Smokeless tobacco: Never Used  . Alcohol Use: Yes     Comment: OCC   OB History    No data available     Review of Systems  10 Systems reviewed and are negative for acute change except as noted in the HPI.   Allergies  Codeine; Augmentin; and Penicillins  Home Medications   Prior to Admission medications   Medication Sig Start Date End Date Taking? Authorizing Provider  acetaminophen (TYLENOL) 325 MG tablet Take 325 mg by mouth every 6 (six) hours as needed for headache.   Yes Historical Provider, MD  albuterol (PROVENTIL HFA;VENTOLIN HFA) 108 (90 Base) MCG/ACT inhaler Inhale 2 puffs into the lungs every 6 (six) hours as needed for wheezing. 07/05/15  Yes WilliBrunetta JeansC  ALPRAZolam (XANADuanne Moron5 MG tablet Take 0.5 tablets (0.125 mg total) by mouth 2 (two) times daily as needed. 08/15/15  Yes Edward Saguier, PA-C  amitriptyline (ELAVIL) 25 MG tablet Take 1 tablet (25 mg total) by mouth at bedtime. 06/09/15  Yes WilliBrunetta JeansC  amLODipine (NORVASC) 2.5 MG tablet TAKE 1 TABLET BY MOUTH DAILY 07/21/15  Yes WilliBrunetta JeansC  aspirin EC 81 MG EC tablet Take 1 tablet (81 mg total) by mouth daily. 07/26/15  Yes Matthew Babish, PA-C  BIOTIN PO Take 1 tablet by mouth daily.    Yes Historical Provider, MD  carvedilol  (COREG) 6.25 MG tablet TAKE 1 TABLET BY MOUTH TWICE DAILY WITH A MEAL 08/07/15  Yes WilliBrunetta JeansC  celecoxib (CELEBREX) 100 MG capsule Take 1 capsule (100 mg  total) by mouth 2 (two) times daily. Patient taking differently: Take 100 mg by mouth 2 (two) times daily as needed for mild pain.  08/14/15  Yes Edward Saguier, PA-C  cloNIDine (CATAPRES) 0.1 MG tablet Take 0.1 mg by mouth daily as needed (high blood pressure).  07/19/15  Yes Historical Provider, MD  clopidogrel (PLAVIX) 75 MG tablet Take 1 tablet (75 mg total) by mouth daily. 08/14/15  Yes Edward Saguier, PA-C  cyanocobalamin (,VITAMIN B-12,) 1000 MCG/ML injection INJECT 1 ML (1,000 MCG TOTAL) INTO THE MUSCLE EVERY 30 (THIRTY) DAYS. LAST DOSE WAS ON 01-29-14 08/07/15  Yes Brunetta Jeans, PA-C  docusate sodium (COLACE) 100 MG capsule Take 1 capsule (100 mg total) by mouth 2 (two) times daily. Patient taking differently: Take 100 mg by mouth daily as needed for moderate constipation.  07/26/15  Yes Matthew Babish, PA-C  donepezil (ARICEPT) 10 MG tablet Take 1.5 tablets (15 mg total) by mouth at bedtime. Takes 1 and 1/2 08/14/15  Yes Edward Saguier, PA-C  escitalopram (LEXAPRO) 10 MG tablet TAKE 1 TABLET BY MOUTH DAILY 05/15/15  Yes Brunetta Jeans, PA-C  ferrous sulfate 325 (65 FE) MG tablet Take 1 tablet (325 mg total) by mouth 3 (three) times daily after meals. Patient taking differently: Take 325 mg by mouth daily.  07/26/15  Yes Matthew Babish, PA-C  Fluticasone Furoate-Vilanterol (BREO ELLIPTA) 100-25 MCG/INH AEPB Use 1 puff daily as directed. Patient taking differently: Take 1 puff by mouth daily.  11/01/14  Yes Brunetta Jeans, PA-C  gabapentin (NEURONTIN) 100 MG capsule Take 1 capsule (100 mg total) by mouth 2 (two) times daily. Patient taking differently: Take 100 mg by mouth 2 (two) times daily as needed (Pain).  07/17/15  Yes Brunetta Jeans, PA-C  HYDROcodone-acetaminophen (NORCO) 7.5-325 MG tablet Take 1-2 tablets by mouth every 4  (four) hours as needed for moderate pain. 07/26/15  Yes Matthew Babish, PA-C  levofloxacin (LEVAQUIN) 500 MG tablet Take 1 tablet (500 mg total) by mouth daily. 08/15/15  Yes Edward Saguier, PA-C  levothyroxine (SYNTHROID) 125 MCG tablet TAKE 1 TABLET (125 MCG TOTAL) BY MOUTH DAILY BEFORE BREAKFAST. 06/09/15  Yes Brunetta Jeans, PA-C  lidocaine (LIDODERM) 5 % Place 1 patch onto the skin daily. Remove & Discard patch within 12 hours or as directed by MD 07/17/15  Yes Brunetta Jeans, PA-C  mesalamine (LIALDA) 1.2 G EC tablet Take 1 tablet (1.2 g total) by mouth 3 (three) times daily. Patient taking differently: Take 1.2 g by mouth at bedtime.  11/01/14  Yes Brunetta Jeans, PA-C  Neomycin-Bacitracin-Polymyxin (NEOSPORIN EX) Apply 1 application topically daily as needed (skin tears). USING DAILY TO RIGHT HAND SKIN TEAR   Yes Historical Provider, MD  nitroGLYCERIN (NITROSTAT) 0.4 MG SL tablet Place 1 tablet (0.4 mg total) under the tongue every 5 (five) minutes as needed for chest pain. 11/01/14  Yes Brunetta Jeans, PA-C  ondansetron (ZOFRAN) 8 MG tablet Take 1 tablet (8 mg total) by mouth every 8 (eight) hours as needed for nausea or vomiting. 11/01/14  Yes Brunetta Jeans, PA-C  oxyCODONE (OXY IR/ROXICODONE) 5 MG immediate release tablet Take 2.5 mg by mouth every 4 (four) hours as needed for moderate pain. Reported on 08/03/2015 07/11/15  Yes Historical Provider, MD  pantoprazole (PROTONIX) 40 MG tablet TAKE 1 TABLET BY MOUTH EVERY MORNING Patient taking differently: TAKE 1 TABLET BY MOUTH EVERY evening 07/21/15  Yes Brunetta Jeans, PA-C  polyethylene glycol (MIRALAX / Floria Raveling)  packet Take 17 g by mouth 2 (two) times daily. Patient taking differently: Take 17 g by mouth daily as needed for moderate constipation.  07/26/15  Yes Matthew Babish, PA-C  simvastatin (ZOCOR) 10 MG tablet TAKE 1 TABLET (10 MG TOTAL) BY MOUTH EVERY OTHER DAY **DISCONTINUE CRESTOR AND ZETIA** 08/07/15  Yes Brunetta Jeans, PA-C   tiZANidine (ZANAFLEX) 4 MG tablet Take 4 mg by mouth 4 (four) times daily as needed for muscle spasms.  07/26/15  Yes Historical Provider, MD  cephALEXin (KEFLEX) 500 MG capsule 1 tab po q day 08/14/15   Mackie Pai, PA-C   BP 174/77 mmHg  Pulse 67  Temp(Src) 97.4 F (36.3 C) (Oral)  Resp 14  SpO2 100% Physical Exam  Constitutional: She is oriented to person, place, and time.  Patient is pale and mildly short of breath. Mental status is clear. She appears very uncomfortable. Patient is cooperative and appropriate.  HENT:  Head: Normocephalic and atraumatic.  Right Ear: External ear normal.  Left Ear: External ear normal.  Nose: Nose normal.  Mouth/Throat: Oropharynx is clear and moist.  Neck: Neck supple.  Cardiovascular: Normal rate, regular rhythm, normal heart sounds and intact distal pulses.   Pulmonary/Chest:  Mild increased work of breathing. Adequate air flow bilaterally. No gross wheeze rhonchi rail.  Abdominal: Soft. Bowel sounds are normal. She exhibits no distension. There is no tenderness.  Musculoskeletal: She exhibits no edema or tenderness.  Neurological: She is alert and oriented to person, place, and time. No cranial nerve deficit. She exhibits normal muscle tone. Coordination normal.  Skin: There is pallor.  Mildly diaphoretic.  Psychiatric: She has a normal mood and affect.    ED Course  Procedures (including critical care time) Labs Review Labs Reviewed  COMPREHENSIVE METABOLIC PANEL - Abnormal; Notable for the following:    Glucose, Bld 104 (*)    Albumin 3.3 (*)    ALT 13 (*)    All other components within normal limits  CBC WITH DIFFERENTIAL/PLATELET - Abnormal; Notable for the following:    Hemoglobin 11.2 (*)    RDW 19.3 (*)    All other components within normal limits  I-STAT CG4 LACTIC ACID, ED - Abnormal; Notable for the following:    Lactic Acid, Venous 3.22 (*)    All other components within normal limits  CULTURE, BLOOD (ROUTINE X 2)   CULTURE, BLOOD (ROUTINE X 2)  URINE CULTURE  TROPONIN I  URINALYSIS, ROUTINE W REFLEX MICROSCOPIC (NOT AT [facility])  BRAIN NATRIURETIC PEPTIDE  I-STAT CG4 LACTIC ACID, ED  I-STAT CG4 LACTIC ACID, ED  I-STAT CG4 LACTIC ACID, ED    Imaging Review Ct Angio Chest Pe W/cm &/or Wo Cm  08/18/2015  CLINICAL DATA:  82 year old with personal history of adenocarcinoma of the left lower lobe diagnosed in March, 2014, post radiation therapy, presenting with acute onset of cough, chest congestion, shortness of breath and nausea. Patient discharged from the hospital approximately 2 weeks ago after left total hip arthroplasty. EXAM: CT ANGIOGRAPHY CHEST WITH CONTRAST TECHNIQUE: Multidetector CT imaging of the chest was performed using the standard protocol during bolus administration of intravenous contrast. Multiplanar CT image reconstructions and MIPs were obtained to evaluate the vascular anatomy. CONTRAST:  44m OMNIPAQUE IOHEXOL 350 MG/ML IV. COMPARISON:  CT chest 12/04/2014 and earlier. PET-CT 04/04/2015 and earlier. FINDINGS: Technical quality:  Good. Pulmonary emboli:  Absent. Cardiovascular: Heart size normal with evidence of left ventricular hypertrophy. Severe 3 vessel coronary atherosclerosis with right coronary artery stent.  No significant pericardial effusion. Severe atherosclerosis involving the thoracic and upper abdominal aorta with calcified and noncalcified plaque. No evidence of aneurysm or dissection. Atherosclerosis at the origin of the of the great vessels without stenosis. Mediastinum/Lymph Nodes: Mildly enlarged precarinal (station 4R) lymph node measuring approximately 1.5 x 2.2 cm, unchanged since the most recent PET-CT. Enlarged right hilar lymph node measuring approximately 2.3 x 1.7 cm, unchanged. Numerous normal and upper normal sized mediastinal lymph nodes, unchanged. These lymph nodes were hypermetabolic on the most recent PET-CT. No new or enlarging lymphadenopathy. Small hiatal hernia.  Normal esophagus. Atrophic thyroid gland. Lungs/Pleura: Postradiation scar and bronchiectasis centrally in the left upper lobe and left lower lobe. Small to moderate-sized left pleural effusion, unchanged. Multiple nodules anteriorly in the left upper lobe, the largest a pleural based medial nodule measuring 1.3 cm maximally, unchanged, hypermetabolic on the prior PET-CT. Focal consolidation in the posterior inferior right lower lobe, unchanged, hypermetabolic on the prior PET-CT. Scarring anteriorly in the inferior right upper lobe, unchanged. Approximate 6 mm nodule posteriorly and inferiorly in the right lower lobe, a new finding. No new nodules or masses elsewhere. Biapical pleuroparenchymal scarring. Central airways patent. Upper abdomen: Simple cysts arising from the upper poles of both kidneys. Approximate 1 cm nodule arising from the lateral limb of the right adrenal gland, unchanged, not hypermetabolic on the prior PET-CT. No acute abnormality involving the visualized upper abdomen. Musculoskeletal: Osseous demineralization. Exaggeration of the usual thoracic kyphosis. Mild-to-moderate degenerative changes throughout the thoracic spine. No evidence of osseous metastatic disease. Review of the MIP images confirms the above findings. IMPRESSION: 1. No evidence pulmonary embolism. 2. Chronic changes throughout both lungs as detailed above, including multiple nodules anteriorly in the left upper lobe, consolidation in the posterior inferior right lower lobe, and scattered areas of parenchymal scarring throughout both lungs. 3. New 6 mm nodule in the posterior inferior right lower lobe. No new nodules elsewhere. 4. Stable mediastinal and right hilar lymphadenopathy. 5. Normal heart size with left ventricular hypertrophy. Three vessel coronary atherosclerosis. Severe thoracic and upper abdominal aortic atherosclerosis with calcified and noncalcified plaque without evidence of aneurysm. Electronically Signed   By:  Evangeline Dakin M.D.   On: 08/18/2015 14:17   Dg Chest Portable 1 View  08/18/2015  CLINICAL DATA:  Recently released from hospital for hip replacement, pneumonia, and today has worsening symptoms of cough, congestion, sob, and nausea EXAM: PORTABLE CHEST 1 VIEW COMPARISON:  08/14/2015 FINDINGS: Since the prior study common new opacity has developed at the left lung appears obscuring the left heart border and left hemidiaphragm. Masslike fullness along the left hilum, and irregular mass in the right upper lobe are stable. Cardiac silhouette is normal in size. Bony thorax is demineralized but grossly intact. IMPRESSION: 1. New opacity at the left lung base. This is consistent with new/recurrent pneumonia likely with associated pleural fluid. Electronically Signed   By: Lajean Manes M.D.   On: 08/18/2015 12:09   I have personally reviewed and evaluated these images and lab results as part of my medical decision-making.   EKG Interpretation   Date/Time:  Friday August 18 2015 11:39:56 EST Ventricular Rate:  61 PR Interval:  159 QRS Duration: 85 QT Interval:  434 QTC Calculation: 437 R Axis:   73 Text Interpretation:  Sinus rhythm no STEMI, no change Confirmed by  Johnney Killian, MD, Jeannie Done [phone]) on 08/18/2015 11:49:31 AM      MDM   Final diagnoses:  Dyspnea  Chest pain, unspecified chest pain  type  Malignant neoplasm of left lung, unspecified part of lung ([place])  Healthcare-associated pneumonia   Patient has improved appearance with fluids. She is no longer diaphoretic or pale. Nausea is controlled. At this point the patient will be admitted for rule out sepsis with suspected healthcare acquired pneumonia. Pulmonary embolus has been ruled out.    Charlesetta Shanks, MD 08/18/15 [phone]

## 2015-08-18 NOTE — ED Notes (Signed)
500 ml bolus clamped by Dr. Charlies Silvers according to patient's daughter.

## 2015-08-18 NOTE — H&P (Addendum)
[facility] History and Physical  Jodi Frazier EYC:[id number] DOB: 12-Sep-1930 DOA: 08/18/2015  Referring physician: ER physician: Dr. Ronnald Ramp PCP: Leeanne Rio, PA-C  Chief Complaint: shortness of breath   HPI:  82 year old female with past medical history of dementia, hypertension, lung adenocarcinoma, COPD, anxiety, just recently hospitalized for UTI and pneumonia who presented to [facility] ED with shortness of breath especially with exertion, intermittent associated chest pain with cough. She had subjective fevers. No chills. No palpitations. No lightheadedness or falls. She was just recently on Levaquin she apparently took for past 4 days but no significant symptomatic relief. No nausea or vomiting. No blood in stool or urine. No diarrhea.   In ED, BP was 90/55, HR 67, RR 20, T max 97.7 F, oxygen saturation 97% with [place] oxygen support. Blood work showed hemoglobin of 11.2, normal creatinine, lactic acid 3.22. CXR showed left lower lobe pneumonia. Since she was recently hospitalized she was started on broad spectrum abx,vanco and aztreonam for HCAP.  Assessment & Plan    Principal Problem:   Sepsis due to pneumonia ([place]) / HCAP (healthcare-associated pneumonia) - Sepsis criteria met on admission with hypotension, tachypnea, lactic acidosis and evidence of infection on CXR, left lower lobe pneumonia - Pneumonia and sepsis order set utilized - Due to allergy to PCN we started vanco, aztreonam  - Follow up blood culture results - Follow up procalcitonin - Follow up legionella, strep pneumonia, influenza, resp culture - Hemodynamically stable  - Monitor on telemetry for first 24 hours   Active Problems:   Primary malignant neoplasm of left lower lobe of lung ([place]) - S/P radiation therapy    Dementia without behavioral disturbance  - Continue aricept    History of CVA (cerebrovascular accident) - Continue aspirin and plavix    Essential hypertension - Continue  Norvasc, coreg, clonidine    Anxiety and depression - Continue lexapro    COPD (chronic obstructive pulmonary disease) (HCC) - Not in acute exacerbation - May use duoneb as needed for shortness of breath or wheezing    IDA (iron deficiency anemia) - Continue ferrous sulfate supplementation    Dyslipidemia - Continue statin therapy  DVT prophylaxis:  - Aspirin and plavix - Added SCD's bilaterally   Radiological Exams on Admission: Ct Angio Chest Pe W/cm &/or Wo Cm 08/18/2015  1. No evidence pulmonary embolism. 2. Chronic changes throughout both lungs as detailed above, including multiple nodules anteriorly in the left upper lobe, consolidation in the posterior inferior right lower lobe, and scattered areas of parenchymal scarring throughout both lungs. 3. New 6 mm nodule in the posterior inferior right lower lobe. No new nodules elsewhere. 4. Stable mediastinal and right hilar lymphadenopathy. 5. Normal heart size with left ventricular hypertrophy. Three vessel coronary atherosclerosis. Severe thoracic and upper abdominal aortic atherosclerosis with calcified and noncalcified plaque without evidence of aneurysm. Electronically Signed   By: Evangeline Dakin M.D.   On: 08/18/2015 14:17   Dg Chest Portable 1 View 08/18/2015 1. New opacity at the left lung base. This is consistent with new/recurrent pneumonia likely with associated pleural fluid. Electronically Signed   By: Lajean Manes M.D.   On: 08/18/2015 12:09     Code Status: DNR/DNI Family Communication: Plan of care discussed with the patient's daughter at the bedside  Disposition Plan: Admit for further evaluation, telemetry due to sepsis   Leisa Lenz, MD  [facility] Pager [phone]  Time spent in minutes: 75 minutes  Review of Systems:  Constitutional: Negative for fever, chills and malaise/fatigue. Negative for diaphoresis.  HENT: Negative for hearing loss, ear pain, nosebleeds, congestion, sore throat, neck pain,  tinnitus and ear discharge.   Eyes: Negative for blurred vision, double vision, photophobia, pain, discharge and redness.  Respiratory: per HPI Cardiovascular: Negative for chest pain, palpitations, orthopnea, claudication and leg swelling.  Gastrointestinal: Negative for nausea, vomiting and abdominal pain. Negative for heartburn, constipation, blood in stool and melena.  Genitourinary: Negative for dysuria, urgency, frequency, hematuria and flank pain.  Musculoskeletal: Negative for myalgias, back pain, joint pain and falls.  Skin: Negative for itching and rash.  Neurological: Negative for dizziness and weakness. Negative for tingling, tremors, sensory change, speech change, focal weakness, loss of consciousness and headaches.  Endo/Heme/Allergies: Negative for environmental allergies and polydipsia. Does not bruise/bleed easily.  Psychiatric/Behavioral: Negative for suicidal ideas. The patient is not nervous/anxious.      History reviewed. No pertinent past medical history. Past Surgical History  Procedure Laterality Date  . Hip arthroscopy  45yrago    right hip-replacement  . Knee arthroscopy      LT  . Total knee arthroplasty  89yrago    left  . Appendectomy  [id number]  . Lithotripsy  2011  . Cataract surgery  2012    left  . Eye surgery  [id number]  . Bladder surgery  2001    tacked  . Colonoscopy    . Esophagogastroduodenoscopy    . Total knee arthroplasty  09/09/2011    Procedure: TOTAL KNEE ARTHROPLASTY;  Surgeon: StepRudean Haskell;  [facility] [place] [place];  Laterality: Right;  Right Total Knee Arthroplasty  . Bronchial brush biopsy Left 08/17/12    LLL Bronchial Washing / Brushing and Bronchial Biopsy: Negative for malignancy  . Left lower lobe needle core biopsy Left 09/07/12    Poorly Differentiated Adenocarcinoma   . Joint replacement    . Stents to heart  2010    x 2 to right rca  . Coronary angioplasty with stent placement  2010    2 in rt  coronary artery and 1 in another spot  . Abdominal hysterectomy  [id number]     complete  . Right knee replacement  2013  . Total hip arthroplasty Left 07/25/2015    Procedure: LEFT TOTAL HIP ARTHROPLASTY ANTERIOR APPROACH;  Surgeon: MatthParalee Cancel  Location: [facility];  Service: Orthopedics;  Laterality: Left;   Social History:  reports that she quit smoking about 12 years ago. Her smoking use included Cigarettes. She has a 40 pack-year smoking history. She has never used smokeless tobacco. She reports that she drinks alcohol. She reports that she does not use illicit drugs.  Allergies  Allergen Reactions  . Codeine Hives  . Augmentin [Amoxicillin-Pot Clavulanate] Diarrhea and Nausea And Vomiting  . Penicillins Hives    Has patient had a PCN reaction causing immediate rash, facial/tongue/throat swelling, SOB or lightheadedness with hypotension: No Has patient had a PCN reaction causing severe rash involving mucus membranes or skin necrosis: Hives  Has patient had a PCN reaction that required hospitalization: No Has patient had a PCN reaction occurring within the last 10 years: No     Family History:  Family History  Problem Relation Age of Onset  . Anesthesia problems Mother   . Hypotension Neg Hx   . Malignant hyperthermia Neg Hx   . Pseudochol deficiency Neg Hx   . Cancer Maternal Aunt     breast  Prior to Admission medications   Medication Sig Start Date End Date Taking? Authorizing Provider  acetaminophen (TYLENOL) 325 MG tablet Take 325 mg by mouth every 6 (six) hours as needed for headache.   Yes Historical Provider, MD  albuterol (PROVENTIL HFA;VENTOLIN HFA) 108 (90 Base) MCG/ACT inhaler Inhale 2 puffs into the lungs every 6 (six) hours as needed for wheezing. 07/05/15  Yes Brunetta Jeans, PA-C  ALPRAZolam Duanne Moron) 0.25 MG tablet Take 0.5 tablets (0.125 mg total) by mouth 2 (two) times daily as needed. 08/15/15  Yes Edward Saguier, PA-C  amitriptyline (ELAVIL) 25 MG  tablet Take 1 tablet (25 mg total) by mouth at bedtime. 06/09/15  Yes Brunetta Jeans, PA-C  amLODipine (NORVASC) 2.5 MG tablet TAKE 1 TABLET BY MOUTH DAILY 07/21/15  Yes Brunetta Jeans, PA-C  aspirin EC 81 MG EC tablet Take 1 tablet (81 mg total) by mouth daily. 07/26/15  Yes Matthew Babish, PA-C  BIOTIN PO Take 1 tablet by mouth daily.    Yes Historical Provider, MD  carvedilol (COREG) 6.25 MG tablet TAKE 1 TABLET BY MOUTH TWICE DAILY WITH A MEAL 08/07/15  Yes Brunetta Jeans, PA-C  celecoxib (CELEBREX) 100 MG capsule Take 1 capsule (100 mg total) by mouth 2 (two) times daily. Patient taking differently: Take 100 mg by mouth 2 (two) times daily as needed for mild pain.  08/14/15  Yes Edward Saguier, PA-C  cloNIDine (CATAPRES) 0.1 MG tablet Take 0.1 mg by mouth daily as needed (high blood pressure).  07/19/15  Yes Historical Provider, MD  clopidogrel (PLAVIX) 75 MG tablet Take 1 tablet (75 mg total) by mouth daily. 08/14/15  Yes Edward Saguier, PA-C  cyanocobalamin (,VITAMIN B-12,) 1000 MCG/ML injection INJECT 1 ML (1,000 MCG TOTAL) INTO THE MUSCLE EVERY 30 (THIRTY) DAYS. LAST DOSE WAS ON 01-29-14 08/07/15  Yes Brunetta Jeans, PA-C  docusate sodium (COLACE) 100 MG capsule Take 1 capsule (100 mg total) by mouth 2 (two) times daily. Patient taking differently: Take 100 mg by mouth daily as needed for moderate constipation.  07/26/15  Yes Matthew Babish, PA-C  donepezil (ARICEPT) 10 MG tablet Take 1.5 tablets (15 mg total) by mouth at bedtime. Takes 1 and 1/2 08/14/15  Yes Edward Saguier, PA-C  escitalopram (LEXAPRO) 10 MG tablet TAKE 1 TABLET BY MOUTH DAILY 05/15/15  Yes Brunetta Jeans, PA-C  ferrous sulfate 325 (65 FE) MG tablet Take 1 tablet (325 mg total) by mouth 3 (three) times daily after meals. Patient taking differently: Take 325 mg by mouth daily.  07/26/15  Yes Matthew Babish, PA-C  Fluticasone Furoate-Vilanterol (BREO ELLIPTA) 100-25 MCG/INH AEPB Use 1 puff daily as directed. Patient taking  differently: Take 1 puff by mouth daily.  11/01/14  Yes Brunetta Jeans, PA-C  gabapentin (NEURONTIN) 100 MG capsule Take 1 capsule (100 mg total) by mouth 2 (two) times daily. Patient taking differently: Take 100 mg by mouth 2 (two) times daily as needed (Pain).  07/17/15  Yes Brunetta Jeans, PA-C  HYDROcodone-acetaminophen (NORCO) 7.5-325 MG tablet Take 1-2 tablets by mouth every 4 (four) hours as needed for moderate pain. 07/26/15  Yes Matthew Babish, PA-C  levofloxacin (LEVAQUIN) 500 MG tablet Take 1 tablet (500 mg total) by mouth daily. 08/15/15  Yes Edward Saguier, PA-C  levothyroxine (SYNTHROID) 125 MCG tablet TAKE 1 TABLET (125 MCG TOTAL) BY MOUTH DAILY BEFORE BREAKFAST. 06/09/15  Yes Brunetta Jeans, PA-C  lidocaine (LIDODERM) 5 % Place 1 patch onto the skin daily. Remove &  Discard patch within 12 hours or as directed by MD 07/17/15  Yes Brunetta Jeans, PA-C  mesalamine (LIALDA) 1.2 G EC tablet Take 1 tablet (1.2 g total) by mouth 3 (three) times daily. Patient taking differently: Take 1.2 g by mouth at bedtime.  11/01/14  Yes Brunetta Jeans, PA-C  Neomycin-Bacitracin-Polymyxin (NEOSPORIN EX) Apply 1 application topically daily as needed (skin tears). USING DAILY TO RIGHT HAND SKIN TEAR   Yes Historical Provider, MD  nitroGLYCERIN (NITROSTAT) 0.4 MG SL tablet Place 1 tablet (0.4 mg total) under the tongue every 5 (five) minutes as needed for chest pain. 11/01/14  Yes Brunetta Jeans, PA-C  ondansetron (ZOFRAN) 8 MG tablet Take 1 tablet (8 mg total) by mouth every 8 (eight) hours as needed for nausea or vomiting. 11/01/14  Yes Brunetta Jeans, PA-C  oxyCODONE (OXY IR/ROXICODONE) 5 MG immediate release tablet Take 2.5 mg by mouth every 4 (four) hours as needed for moderate pain. Reported on 08/03/2015 07/11/15  Yes Historical Provider, MD  pantoprazole (PROTONIX) 40 MG tablet TAKE 1 TABLET BY MOUTH EVERY MORNING Patient taking differently: TAKE 1 TABLET BY MOUTH EVERY evening 07/21/15  Yes Brunetta Jeans, PA-C  polyethylene glycol (MIRALAX / GLYCOLAX) packet Take 17 g by mouth 2 (two) times daily. Patient taking differently: Take 17 g by mouth daily as needed for moderate constipation.  07/26/15  Yes Matthew Babish, PA-C  simvastatin (ZOCOR) 10 MG tablet TAKE 1 TABLET (10 MG TOTAL) BY MOUTH EVERY OTHER DAY **DISCONTINUE CRESTOR AND ZETIA** 08/07/15  Yes Brunetta Jeans, PA-C  tiZANidine (ZANAFLEX) 4 MG tablet Take 4 mg by mouth 4 (four) times daily as needed for muscle spasms.  07/26/15  Yes Historical Provider, MD  cephALEXin (KEFLEX) 500 MG capsule 1 tab po q day 08/14/15   Mackie Pai, PA-C   Physical Exam: Filed Vitals:   08/18/15 1120 08/18/15 1300 08/18/15 1457  BP: 90/55 142/69 174/77  Pulse: 60 64 67  Temp: 97.7 F (36.5 C)  97.4 F (36.3 C)  TempSrc: Oral  Oral  Resp: _0 SpO2: 100% 97% 100%    Physical Exam  Constitutional: Appears well-developed and well-nourished. No distress.  HENT: Normocephalic. No tonsillar erythema or exudates Eyes: Conjunctivae are normal. No scleral icterus.  Neck: Normal ROM. Neck supple. No JVD. No tracheal deviation. No thyromegaly.  CVS: RRR, S1/S2 appreciated  Pulmonary: diminished with mild wheezing in upper lung lobes, no rhonchi .  Abdominal: Soft. BS +,  no distension, tenderness, rebound or guarding.  Musculoskeletal: Normal range of motion. No edema and no tenderness.  Lymphadenopathy: No lymphadenopathy noted, cervical, inguinal. Neuro: Alert. No focal neurologic deficits. Skin: Skin is warm and dry. No rash noted.  No erythema. No pallor.  Psychiatric: Normal mood and affect. Behavior, judgment, thought content normal.   Labs on Admission:  Basic Metabolic Panel:  Recent Labs Lab 08/14/15 1637 08/18/15 1136  NA 136 141  K 3.7 3.8  CL 101 107  CO2 27 24  GLUCOSE 100* 104*  BUN 18 13  CREATININE 0.93 0.81  CALCIUM 10.1 10.0   Liver Function Tests:  Recent Labs Lab 08/14/15 1637 08/18/15 1136  AST 17 23   ALT 15 13*  ALKPHOS 123* 112  BILITOT 0.4 0.7  PROT 6.9 7.1  ALBUMIN 3.7 3.3*   No results for input(s): LIPASE, AMYLASE in the last 168 hours. No results for input(s): AMMONIA in the last 168 hours. CBC:  Recent Labs Lab  08/14/15 1637 08/18/15 1136  WBC 9.5 6.8  NEUTROABS 7.1 4.8  HGB 11.4* 11.2*  HCT 35.3* 36.2  MCV 83.5 88.7  PLT 448.0* 389   Cardiac Enzymes:  Recent Labs Lab 08/18/15 1317  TROPONINI <0.03   BNP: Invalid input(s): POCBNP CBG: No results for input(s): GLUCAP in the last 168 hours.  If 7PM-7AM, please contact night-coverage [email or web address] Password [facility] 08/18/2015, 4:28 PM

## 2015-08-19 DIAGNOSIS — E785 Hyperlipidemia, unspecified: Secondary | ICD-10-CM

## 2015-08-19 DIAGNOSIS — J189 Pneumonia, unspecified organism: Secondary | ICD-10-CM

## 2015-08-19 DIAGNOSIS — A419 Sepsis, unspecified organism: Principal | ICD-10-CM

## 2015-08-19 DIAGNOSIS — F418 Other specified anxiety disorders: Secondary | ICD-10-CM

## 2015-08-19 DIAGNOSIS — C3492 Malignant neoplasm of unspecified part of left bronchus or lung: Secondary | ICD-10-CM

## 2015-08-19 LAB — CBC
HCT: 33.3 % — ABNORMAL LOW (ref 36.0–46.0)
HEMOGLOBIN: 10.3 g/dL — AB (ref 12.0–15.0)
MCH: 27.5 pg (ref 26.0–34.0)
MCHC: 30.9 g/dL (ref 30.0–36.0)
MCV: 89 fL (ref 78.0–100.0)
Platelets: 316 10*3/uL (ref 150–400)
RBC: 3.74 MIL/uL — ABNORMAL LOW (ref 3.87–5.11)
RDW: 19.5 % — AB (ref 11.5–15.5)
WBC: 5.6 10*3/uL (ref 4.0–10.5)

## 2015-08-19 LAB — BASIC METABOLIC PANEL
ANION GAP: 6 (ref 5–15)
BUN: 8 mg/dL (ref 6–20)
CALCIUM: 9 mg/dL (ref 8.9–10.3)
CO2: 24 mmol/L (ref 22–32)
CREATININE: 0.69 mg/dL (ref 0.44–1.00)
Chloride: 107 mmol/L (ref 101–111)
GFR calc non Af Amer: >60 mL/min (ref >60)
GLUCOSE: 93 mg/dL (ref 65–99)
Potassium: 2.8 mmol/L — ABNORMAL LOW (ref 3.5–5.1)
SODIUM: 137 mmol/L (ref 135–145)

## 2015-08-19 LAB — TSH: TSH: 5.248 u[IU]/mL — ABNORMAL HIGH (ref 0.350–4.500)

## 2015-08-19 LAB — HIV ANTIBODY (ROUTINE TESTING W REFLEX): HIV SCREEN 4TH GENERATION: NONREACTIVE

## 2015-08-19 LAB — GLUCOSE, CAPILLARY: Glucose-Capillary: 125 mg/dL — ABNORMAL HIGH (ref 65–99)

## 2015-08-19 MED ORDER — POTASSIUM CHLORIDE CRYS ER 20 MEQ PO TBCR
40.0000 meq | EXTENDED_RELEASE_TABLET | Freq: Two times a day (BID) | ORAL | Status: AC
Start: 2015-08-19 — End: 2015-08-19
  Administered 2015-08-19 (×2): 40 meq via ORAL
  Filled 2015-08-19 (×2): qty 2

## 2015-08-19 MED ORDER — HYDRALAZINE HCL 20 MG/ML IJ SOLN
10.0000 mg | INTRAMUSCULAR | Status: DC | PRN
  Administered 2015-08-19 – 2015-08-22 (×4): 10 mg via INTRAVENOUS
  Filled 2015-08-19 (×4): qty 1

## 2015-08-19 MED ORDER — HYDRALAZINE HCL 20 MG/ML IJ SOLN
10.0000 mg | Freq: Once | INTRAMUSCULAR | Status: AC
  Administered 2015-08-19: 10 mg via INTRAVENOUS
  Filled 2015-08-19: qty 1

## 2015-08-19 NOTE — Evaluation (Signed)
Physical Therapy Evaluation Patient Details Name: Jodi Frazier MRN: [id number] DOB: [birth year] Today's Date: 08/19/2015   History of Present Illness  82 yo female admitted with sepsis. Hx of dementia, HTN, lung cancer, COPD, L THA 07/2015  Clinical Impression  On eval, pt required Min assist for mobility-walked ~15 feet with RW. Pt is generally weak and fatigues easily. Recommend ST rehab at SNF. Will follow and progress activity as able.     Follow Up Recommendations SNF    Equipment Recommendations  None recommended by PT    Recommendations for Other Services       Precautions / Restrictions Precautions Precautions: Fall Restrictions Weight Bearing Restrictions: No LLE Weight Bearing: Weight bearing as tolerated      Mobility  Bed Mobility Overal bed mobility: Needs Assistance Bed Mobility: Supine to Sit;Sit to Supine     Supine to sit: Min guard;HOB elevated Sit to supine: Min guard;HOB elevated   General bed mobility comments: close guard. increased time.   Transfers Overall transfer level: Needs assistance Equipment used: Rolling walker (2 wheeled) Transfers: Sit to/from Stand Sit to Stand: Min assist         General transfer comment: assist to rise, stabilize, control descent. vcs safety, hand placement  Ambulation/Gait Ambulation/Gait assistance: Min assist Ambulation Distance (Feet): 15 Feet Assistive device: Rolling walker (2 wheeled) Gait Pattern/deviations: Step-through pattern;Decreased stride length     General Gait Details: assist to stabilize. slow gait speed. pt fatigues quickly/easily.   Stairs            Wheelchair Mobility    Modified Rankin (Stroke Patients Only)       Balance           Standing balance support: Bilateral upper extremity supported;During functional activity Standing balance-Leahy Scale: Poor                               Pertinent Vitals/Pain Pain Assessment: No/denies pain     Home Living Family/patient expects to be discharged to:: Private residence Living Arrangements: Children (daughter) Available Help at Discharge: Family Type of Home: House Home Access: Stairs to enter   Technical brewer of Steps: 1 Home Layout: One level        Prior Function Level of Independence: Needs assistance   Gait / Transfers Assistance Needed: was needing assist with walker at home since THA  ADL's / Homemaking Assistance Needed: daughter assists patient with all ADLs        Hand Dominance        Extremity/Trunk Assessment   Upper Extremity Assessment: Generalized weakness           Lower Extremity Assessment: Generalized weakness         Communication   Communication: No difficulties  Cognition Arousal/Alertness: Awake/alert Behavior During Therapy: WFL for tasks assessed/performed Overall Cognitive Status: Within Functional Limits for tasks assessed                      General Comments      Exercises        Assessment/Plan    PT Assessment Patient needs continued PT services  PT Diagnosis Difficulty walking;Generalized weakness   PT Problem List Decreased strength;Decreased activity tolerance;Decreased balance;Decreased mobility;Decreased knowledge of use of DME  PT Treatment Interventions DME instruction;Gait training;Functional mobility training;Therapeutic activities;Patient/family education;Balance training;Therapeutic exercise   PT Goals (Current goals can be found in the Care Plan section) Acute Rehab  PT Goals Patient Stated Goal: to go to rehab PT Goal Formulation: With patient Time For Goal Achievement: 09/02/15 Potential to Achieve Goals: Fair    Frequency Min 3X/week   Barriers to discharge        Co-evaluation               End of Session Equipment Utilized During Treatment: Gait belt Activity Tolerance: Patient limited by fatigue Patient left: in bed;with call bell/phone within reach;with  bed alarm set           Time: 9987-2158 PT Time Calculation (min) (ACUTE ONLY): 32 min   Charges:   PT Evaluation $PT Eval Moderate Complexity: 1 Procedure PT Treatments $Gait Training: 8-22 mins   PT G Codes:        Weston Anna, MPT Pager: [phone]

## 2015-08-19 NOTE — Progress Notes (Signed)
Patient ID: Jodi Frazier, female   DOB: 1930-07-13, 82 y.o.   MRN: [id number]  [facility] PROGRESS NOTE  Jodi Frazier PPI:[id number] DOB: May 14, 1931 DOA: 08/18/2015 PCP: Jodi Rio, PA-C   Brief narrative:    82 year old female with past medical history of dementia, hypertension, lung adenocarcinoma, COPD, anxiety, just recently hospitalized for UTI and pneumonia who presented to [facility] ED with shortness of breath especially with exertion, intermittent associated chest pain with cough. She had subjective fevers. No chills. No palpitations. No lightheadedness or falls. She was just recently on Levaquin she apparently took for past 4 days but no significant symptomatic relief. No nausea or vomiting. No blood in stool or urine. No diarrhea.   In ED, BP was 90/55, HR 67, RR 20, T max 97.7 F, oxygen saturation 97% with [place] oxygen support. Blood work showed hemoglobin of 11.2, normal creatinine, lactic acid 3.22. CXR showed left lower lobe pneumonia. Since she was recently hospitalized she was started on broad spectrum abx,vanco and aztreonam for HCAP.  Assessment/Plan:    Principal Problem:  Sepsis due to pneumonia ([place]) / HCAP (healthcare-associated pneumonia) - Sepsis criteria met on admission with hypotension, tachypnea, lactic acidosis and evidence of infection on CXR, left lower lobe pneumonia and Klebsiella UTI  - Pneumonia and sepsis order set utilized - Due to allergy to PCN we started vanco, aztreonam, continue same regimen, today is day #2 - Follow up blood culture results - Follow up legionella, strep pneumonia, influenza, resp culture  Active Problems:  Primary malignant neoplasm of left lower lobe of lung ([place]) - S/P radiation therapy - outpatient follow up is reasonable    Dementia without behavioral disturbance  - Continue aricept   History of CVA (cerebrovascular accident) - Continue aspirin and plavix   Essential hypertension - Continue Norvasc,  coreg, clonidine - add hydralazine as needed for better BP control     Hypokalemia  - supplement and repeat BMP in AM   Anxiety and depression - Continue lexapro   COPD (chronic obstructive pulmonary disease) (HCC) - Not in acute exacerbation - May use duoneb as needed for shortness of breath or wheezing   IDA (iron deficiency anemia) - Continue ferrous sulfate supplementation    Thrombocytosis - reactive - resolved    Dyslipidemia - Continue statin therapy  DVT prophylaxis - SCD's  Code Status: DNR Family Communication:  plan of care discussed with the patient  Disposition Plan: SNF 2/20   IV access:  Peripheral IV  Procedures and diagnostic studies:     Ct Angio Chest Pe W/cm &/or Wo Cm 08/18/2015 No evidence pulmonary embolism, multiple nodules anteriorly in the left upper lobe, consolidation in the posterior inferior right lower lobe, and scattered areas of parenchymal scarring throughout both lungs. 3. New 6 mm nodule in the posterior inferior right lower lobe. No new nodules elsewhere. 4. Stable mediastinal and right hilar lymphadenopathy. 5. Normal heart size with left ventricular hypertrophy. Three vessel coronary atherosclerosis. Severe thoracic and upper abdominal aortic atherosclerosis with calcified and noncalcified plaque without evidence of aneurysm.   Dg Chest Portable 1 View 08/18/2015 New opacity at the left lung base. This is consistent with new/recurrent pneumonia likely with associated pleural fluid.  Medical Consultants:  None   Other Consultants:  None  IAnti-Infectives:   Vancomycin 2/17 --> Aztreonam 2/17 -->  Jodi Ramsay, MD  [facility] Pager [phone]  If 7PM-7AM, please contact night-coverage [email or web address] Password [email or web address] 08/19/2015, 2:56 PM   LOS: 1 day  HPI/Subjective: No events overnight.   Objective: Filed Vitals:   08/18/15 2320 08/19/15 0503 08/19/15 0736 08/19/15 1311  BP: 177/53 187/76 151/48 173/69  Pulse:  66  73   Temp:  97.7 F (36.5 C)  97.8 F (36.6 C)  TempSrc:  Oral  Oral  Resp:  18  20  Height:      Weight:  79.8 kg (175 lb 14.8 oz)    SpO2:  98%  99%    Intake/Output Summary (Last 24 hours) at 08/19/15 1456 Last data filed at 08/19/15 1216  Gross per 24 hour  Intake 1090.83 ml  Output      0 ml  Net 1090.83 ml    Exam:   General:  Pt is alert, follows commands appropriately, not in acute distress  Cardiovascular: Regular rate and rhythm, no rubs, no gallops  Respiratory: Clear to auscultation bilaterally, rales at bases   Abdomen: Soft, non tender, non distended, bowel sounds present, no guarding  Extremities: pulses DP and PT palpable bilaterally  Neuro: Grossly nonfocal  Data Reviewed: Basic Metabolic Panel:  Recent Labs Lab 08/14/15 1637 08/18/15 1136 08/19/15 0522  NA 136 141 137  K 3.7 3.8 2.8*  CL 101 107 107  CO2 27 24 24  GLUCOSE 100* 104* 93  BUN 18 13 8  CREATININE 0.93 0.81 0.69  CALCIUM 10.1 10.0 9.0   Liver Function Tests:  Recent Labs Lab 08/14/15 1637 08/18/15 1136  AST 17 23  ALT 15 13*  ALKPHOS 123* 112  BILITOT 0.4 0.7  PROT 6.9 7.1  ALBUMIN 3.7 3.3*   CBC:  Recent Labs Lab 08/14/15 1637 08/18/15 1136 08/19/15 0522  WBC 9.5 6.8 5.6  NEUTROABS 7.1 4.8  --   HGB 11.4* 11.2* 10.3*  HCT 35.3* 36.2 33.3*  MCV 83.5 88.7 89.0  PLT 448.0* 389 316   Cardiac Enzymes:  Recent Labs Lab 08/18/15 1317  TROPONINI <0.03   CBG:  Recent Labs Lab 08/19/15 0751  GLUCAP 125*   Recent Results (from the past 240 hour(s))  Urine Culture     Status: None   Collection Time: 08/15/15 11:39 AM  Result Value Ref Range Status   Culture KLEBSIELLA PNEUMONIAE  Final   Colony Count >=100,000 COLONIES/ML  Final   Organism ID, Bacteria KLEBSIELLA PNEUMONIAE  Final      Susceptibility   Klebsiella pneumoniae -  (no method available)    AMPICILLIN >=32 Resistant     AMOX/CLAVULANIC 4 Sensitive     AMPICILLIN/SULBACTAM 16 Intermediate      PIP/TAZO 16 Sensitive     IMIPENEM <=0.25 Sensitive     CEFAZOLIN <=4 Not Reportable     CEFTRIAXONE <=1 Sensitive     CEFTAZIDIME <=1 Sensitive     CEFEPIME <=1 Sensitive     GENTAMICIN <=1 Sensitive     TOBRAMYCIN <=1 Sensitive     CIPROFLOXACIN <=0.25 Sensitive     LEVOFLOXACIN 1 Sensitive     NITROFURANTOIN 64 Intermediate     TRIMETH/SULFA* <=20 Sensitive      * NR=NOT REPORTABLE,SEE COMMENTORAL therapy:A cefazolin MIC of <32 predicts susceptibility to the oral agents cefaclor,cefdinir,cefpodoxime,cefprozil,cefuroxime,cephalexin,and loracarbef when used for therapy of uncomplicated UTIs due to E.coli,K.pneumomiae,and P.mirabilis. PARENTERAL therapy: A cefazolinMIC of >8 indicates resistance to parenteralcefazolin. An alternate test method must beperformed to confirm susceptibility to parenteralcefazolin.  Blood Culture (routine x 2)     Status: None (Preliminary result)   Collection Time: 08/18/15 11:36 AM  Result Value Ref Range Status     Specimen Description BLOOD RIGHT ANTECUBITAL  Final   Special Requests BOTTLES DRAWN AEROBIC AND ANAEROBIC 10CC  Final   Culture   Final    NO GROWTH < 24 HOURS Performed at [facility]    Report Status PENDING  Incomplete  Blood Culture (routine x 2)     Status: None (Preliminary result)   Collection Time: 08/18/15 11:36 AM  Result Value Ref Range Status   Specimen Description BLOOD LEFT WRIST  Final   Special Requests BOTTLES DRAWN AEROBIC AND ANAEROBIC 5CC  Final   Culture   Final    NO GROWTH < 24 HOURS Performed at [facility]    Report Status PENDING  Incomplete  Urine culture     Status: None (Preliminary result)   Collection Time: 08/18/15  3:10 PM  Result Value Ref Range Status   Specimen Description URINE, CLEAN CATCH  Final   Special Requests NONE  Final   Culture   Final    NO GROWTH < 24 HOURS Performed at [facility]    Report Status PENDING  Incomplete     Scheduled Meds: . amitriptyline   25 mg Oral QHS  . amLODipine  2.5 mg Oral Daily  . aspirin EC  81 mg Oral Daily  . aztreonam  1 g Intravenous Q8H  . carvedilol  6.25 mg Oral BID WC  . clopidogrel  75 mg Oral Daily  . donepezil  15 mg Oral QHS  . escitalopram  10 mg Oral Daily  . ferrous sulfate  325 mg Oral Daily  . fluticasone furoate-vilanterol  1 puff Inhalation Daily  . levothyroxine  125 mcg Oral QAC breakfast  . mesalamine  1.2 g Oral QHS  . pantoprazole  40 mg Oral QPM  . potassium chloride  40 mEq Oral BID  . simvastatin  10 mg Oral q1800  . sodium chloride flush  3 mL Intravenous Q12H  . vancomycin  750 mg Intravenous Q12H   Continuous Infusions: . sodium chloride 50 mL/hr (08/18/15 1759)

## 2015-08-19 NOTE — [place] FL2 (Signed)
[place] MEDICAID FL2 LEVEL OF CARE SCREENING TOOL     IDENTIFICATION  Patient Name: Jodi Frazier Birthdate: 1930-08-07 Sex: female Admission Date (Current Location): 08/18/2015  [facility] and [place] Number:  Herbalist and Address:  [facility],  [place] [address] [place]      Provider Number: [id number]  Attending Physician Name and Address:  Theodis Blaze, MD  Relative Name and Phone Number:       Current Level of Care: Hospital Recommended Level of Care: [place] Prior Approval Number:    Date Approved/Denied:   PASRR Number: [id number]  Discharge Plan: SNF    Current Diagnoses: Patient Active Problem List   Diagnosis Date Noted  . Sepsis due to pneumonia ([place]) 08/18/2015  . Anxiety and depression 08/18/2015  . COPD (chronic obstructive pulmonary disease) ([place]) 08/18/2015  . IDA (iron deficiency anemia) 08/18/2015  . Dyslipidemia 08/18/2015  . S/P radiation therapy   . HCAP (healthcare-associated pneumonia)   . Essential hypertension 04/07/2014  . History of CVA (cerebrovascular accident) 03/02/2014  . Dementia   . Primary malignant neoplasm of left lower lobe of lung ([place]) 09/18/2012  . Adenocarcinoma of lung ([place]) 09/07/2012    Orientation RESPIRATION BLADDER Height & Weight     Self, Place  Normal Continent Weight: 175 lb 14.8 oz (79.8 kg) Height:  '5\' 11"'$  (180.3 cm)  BEHAVIORAL SYMPTOMS/MOOD NEUROLOGICAL BOWEL NUTRITION STATUS      Continent Diet (Heart)  AMBULATORY STATUS COMMUNICATION OF NEEDS Skin   Extensive Assist Verbally Normal                       Personal Care Assistance Level of Assistance  Bathing, Dressing Bathing Assistance: Limited assistance   Dressing Assistance: Limited assistance     Functional Limitations Info             SPECIAL CARE FACTORS FREQUENCY  PT (By licensed PT), OT (By licensed OT)     PT Frequency: 5 OT Frequency: 5            Contractures       Additional Factors Info  Code Status, Allergies Code Status Info: DNR Allergies Info: Codeine, Augmentin, Penicillins           Current Medications (08/19/2015):  This is the current hospital active medication list Current Facility-Administered Medications  Medication Dose Route Frequency Provider Last Rate Last Dose  . 0.9 %  sodium chloride infusion   Intravenous Continuous Robbie Lis, MD 50 mL/hr at 08/18/15 1759 50 mL/hr at 08/18/15 1759  . acetaminophen (TYLENOL) tablet 650 mg  650 mg Oral Q6H PRN Robbie Lis, MD   650 mg at 08/18/15 1729   Or  . acetaminophen (TYLENOL) suppository 650 mg  650 mg Rectal Q6H PRN Robbie Lis, MD      . acetaminophen (TYLENOL) tablet 325 mg  325 mg Oral Q6H PRN Robbie Lis, MD   325 mg at 08/19/15 0455  . ALPRAZolam Duanne Moron) tablet 0.125 mg  0.125 mg Oral BID PRN Robbie Lis, MD   0.125 mg at 08/18/15 2345  . amitriptyline (ELAVIL) tablet 25 mg  25 mg Oral QHS Robbie Lis, MD   25 mg at 08/18/15 2221  . amLODipine (NORVASC) tablet 2.5 mg  2.5 mg Oral Daily Robbie Lis, MD   2.5 mg at 08/19/15 0933  . aspirin EC tablet 81 mg  81 mg Oral Daily  Robbie Lis, MD   81 mg at 08/19/15 6789  . aztreonam (AZACTAM) 1 g in dextrose 5 % 50 mL IVPB  1 g Intravenous Q8H Terri L Green, RPH 100 mL/hr at 08/19/15 1114 1 g at 08/19/15 1114  . carvedilol (COREG) tablet 6.25 mg  6.25 mg Oral BID WC Robbie Lis, MD   6.25 mg at 08/19/15 3810  . celecoxib (CELEBREX) capsule 100 mg  100 mg Oral BID PRN Robbie Lis, MD      . cloNIDine (CATAPRES) tablet 0.1 mg  0.1 mg Oral Daily PRN Robbie Lis, MD   0.1 mg at 08/18/15 2031  . clopidogrel (PLAVIX) tablet 75 mg  75 mg Oral Daily Robbie Lis, MD   75 mg at 08/19/15 0932  . docusate sodium (COLACE) capsule 100 mg  100 mg Oral Daily PRN Robbie Lis, MD      . donepezil (ARICEPT) tablet 15 mg  15 mg Oral QHS Robbie Lis, MD   15 mg at 08/18/15 2221  . escitalopram (LEXAPRO) tablet 10 mg  10 mg Oral  Daily Robbie Lis, MD   10 mg at 08/19/15 0933  . ferrous sulfate tablet 325 mg  325 mg Oral Daily Robbie Lis, MD   325 mg at 08/19/15 1751  . fluticasone furoate-vilanterol (BREO ELLIPTA) 100-25 MCG/INH 1 puff  1 puff Inhalation Daily Robbie Lis, MD   1 puff at 08/18/15 2143  . gabapentin (NEURONTIN) capsule 100 mg  100 mg Oral BID PRN Robbie Lis, MD      . levothyroxine (SYNTHROID, LEVOTHROID) tablet 125 mcg  125 mcg Oral QAC breakfast Robbie Lis, MD   125 mcg at 08/19/15 [id number]  . mesalamine (LIALDA) EC tablet 1.2 g  1.2 g Oral QHS Robbie Lis, MD   1.2 g at 08/18/15 2200  . ondansetron (ZOFRAN) tablet 4 mg  4 mg Oral Q6H PRN Robbie Lis, MD       Or  . ondansetron [facility]) injection 4 mg  4 mg Intravenous Q6H PRN Robbie Lis, MD      . oxyCODONE (Oxy IR/ROXICODONE) immediate release tablet 2.5 mg  2.5 mg Oral Q4H PRN Robbie Lis, MD      . pantoprazole (PROTONIX) EC tablet 40 mg  40 mg Oral QPM Robbie Lis, MD   40 mg at 08/18/15 2221  . potassium chloride SA (K-DUR,KLOR-CON) CR tablet 40 mEq  40 mEq Oral BID Theodis Blaze, MD   40 mEq at 08/19/15 0932  . simvastatin (ZOCOR) tablet 10 mg  10 mg Oral q1800 Robbie Lis, MD      . sodium chloride flush (NS) 0.9 % injection 3 mL  3 mL Intravenous Q12H Robbie Lis, MD   3 mL at 08/19/15 0940  . tiZANidine (ZANAFLEX) tablet 4 mg  4 mg Oral QID PRN Robbie Lis, MD      . vancomycin (VANCOCIN) IVPB 750 mg/150 ml premix  750 mg Intravenous Q12H Minda Ditto, RPH   750 mg at 08/19/15 0138     Discharge Medications: Please see discharge summary for a list of discharge medications.  Relevant Imaging Results:  Relevant Lab Results:   Additional Information SSN: [id number]  Standley Brooking, LCSW

## 2015-08-19 NOTE — Clinical Social Work Placement (Addendum)
CSW completed FL2 and sent information out to [facility] - left message for [place] at [place] (patient & daughter's first choice), will await call back re: bed offer.    Raynaldo Opitz, LCSW [facility] Clinical Social Worker    CLINICAL SOCIAL WORK PLACEMENT  NOTE  Date:  08/19/2015  Patient Details  Name: MUNTAHA VERMETTE MRN: [id number] Date of Birth: Dec 19, 1930  Clinical Social Work is seeking post-discharge placement for this patient at the [place] level of care (*CSW will initial, date and re-position this form in  chart as items are completed):  Yes   Patient/family provided with [place] Work Department's list of facilities offering this level of care within the geographic area requested by the patient (or if unable, by the patient's family).  Yes   Patient/family informed of their freedom to choose among providers that offer the needed level of care, that participate in Medicare, Medicaid or managed care program needed by the patient, have an available bed and are willing to accept the patient.  Yes   Patient/family informed of [place]'s ownership interest in [facility] and [facility], as well as of the fact that they are under no obligation to receive care at these facilities.  PASRR submitted to EDS on       PASRR number received on       Existing PASRR number confirmed on 08/19/15     FL2 transmitted to all facilities in geographic area requested by pt/family on 08/19/15     FL2 transmitted to all facilities within larger geographic area on       Patient informed that his/her managed care company has contracts with or will negotiate with certain facilities, including the following:            Patient/family informed of bed offers received.  Patient chooses bed at       Physician recommends and patient chooses bed at      Patient to be transferred to   on  .  Patient to be transferred  to facility by       Patient family notified on   of transfer.  Name of family member notified:        PHYSICIAN       Additional Comment:    _______________________________________________ Standley Brooking, LCSW 08/19/2015, 1:14 PM

## 2015-08-20 DIAGNOSIS — J438 Other emphysema: Secondary | ICD-10-CM

## 2015-08-20 LAB — CBC
HCT: 31.2 % — ABNORMAL LOW (ref 36.0–46.0)
HEMOGLOBIN: 9.8 g/dL — AB (ref 12.0–15.0)
MCH: 27.9 pg (ref 26.0–34.0)
MCHC: 31.4 g/dL (ref 30.0–36.0)
MCV: 88.9 fL (ref 78.0–100.0)
Platelets: 304 10*3/uL (ref 150–400)
RBC: 3.51 MIL/uL — AB (ref 3.87–5.11)
RDW: 20 % — ABNORMAL HIGH (ref 11.5–15.5)
WBC: 6.6 10*3/uL (ref 4.0–10.5)

## 2015-08-20 LAB — BASIC METABOLIC PANEL
ANION GAP: 7 (ref 5–15)
BUN: 17 mg/dL (ref 6–20)
CHLORIDE: 109 mmol/L (ref 101–111)
CO2: 25 mmol/L (ref 22–32)
Calcium: 9.3 mg/dL (ref 8.9–10.3)
Creatinine, Ser: 0.87 mg/dL (ref 0.44–1.00)
GFR calc Af Amer: >60 mL/min (ref >60)
GFR calc non Af Amer: >60 mL/min (ref >60)
GLUCOSE: 90 mg/dL (ref 65–99)
Potassium: 4.4 mmol/L (ref 3.5–5.1)
Sodium: 141 mmol/L (ref 135–145)

## 2015-08-20 LAB — URINE CULTURE: CULTURE: NO GROWTH

## 2015-08-20 LAB — GLUCOSE, CAPILLARY: Glucose-Capillary: 87 mg/dL (ref 65–99)

## 2015-08-20 LAB — MAGNESIUM: MAGNESIUM: 1.9 mg/dL (ref 1.7–2.4)

## 2015-08-20 MED ORDER — LEVOFLOXACIN 750 MG PO TABS
750.0000 mg | ORAL_TABLET | Freq: Every day | ORAL | Status: DC
  Administered 2015-08-20 – 2015-08-22 (×3): 750 mg via ORAL
  Filled 2015-08-20 (×3): qty 1

## 2015-08-20 MED ORDER — ENSURE ENLIVE PO LIQD
237.0000 mL | Freq: Two times a day (BID) | ORAL | Status: DC
  Administered 2015-08-21: 237 mL via ORAL

## 2015-08-20 NOTE — Progress Notes (Signed)
Initial Nutrition Assessment  INTERVENTION:   -Provide Ensure Enlive po BID, each supplement provides 350 kcal and 20 grams of protein -RD to continue to monitor  NUTRITION DIAGNOSIS:   Increased nutrient needs related to cancer and cancer related treatments as evidenced by estimated needs.  GOAL:   Patient will meet greater than or equal to 90% of their needs  MONITOR:   PO intake, Supplement acceptance, Labs, Weight trends, I & O's  REASON FOR ASSESSMENT:   Consult Assessment of nutrition requirement/status  ASSESSMENT:   82 year old female with past medical history of dementia, hypertension, lung adenocarcinoma, COPD, anxiety, just recently hospitalized for UTI and pneumonia who presented to [facility] ED with shortness of breath especially with exertion, intermittent associated chest pain with cough  Patient eating well, PO intake: 75-100% of heart healthy diet. Pt was recently discharged 2/1. Was previously ordered Ensure supplements. Will resume Ensure order for this admission d/t pt's increased needs from lung cancer and COPD. Per weight history, pt has lost 17 lb since October 2016 (9% weight loss x 4.5 months, significant for time frame).  Patient with no sign of depletion of muscle mass or body fat.  Labs reviewed. Medications: Ferrous sulfate tablet daily, Zofran PRN  Diet Order:  Diet Heart Room service appropriate?: Yes; Fluid consistency:: Thin  Skin:  Reviewed, no issues  Last BM:  2/18  Height:   Ht Readings from Last 1 Encounters:  08/18/15 '5\' 11"'$  (1.803 m)    Weight:   Wt Readings from Last 1 Encounters:  08/20/15 177 lb 4 oz (80.4 kg)    Ideal Body Weight:  70.5 kg  BMI:  Body mass index is 24.73 kg/(m^2).  Estimated Nutritional Needs:   Kcal:  1900-2100  Protein:  95-105g  Fluid:  2L/day  EDUCATION NEEDS:   No education needs identified at this time  Clayton Bibles, MS, RD, LDN Pager: [phone] After Hours Pager: [phone]

## 2015-08-20 NOTE — Progress Notes (Signed)
Patient ID: Jodi Frazier, female   DOB: 1930/10/22, 82 y.o.   MRN: [id number]  [facility] PROGRESS NOTE  Jodi Frazier GYF:[id number] DOB: 12-01-1930 DOA: 08/18/2015 PCP: Leeanne Rio, PA-C   Brief narrative:    82 year old female with past medical history of dementia, hypertension, lung adenocarcinoma, COPD, anxiety, just recently hospitalized for UTI and pneumonia who presented to [facility] ED with shortness of breath especially with exertion, intermittent associated chest pain with cough. She had subjective fevers. No chills. No palpitations. No lightheadedness or falls. She was just recently on Levaquin she apparently took for past 4 days but no significant symptomatic relief. No nausea or vomiting. No blood in stool or urine. No diarrhea.   In ED, BP was 90/55, HR 67, RR 20, T max 97.7 F, oxygen saturation 97% with [place] oxygen support. Blood work showed hemoglobin of 11.2, normal creatinine, lactic acid 3.22. CXR showed left lower lobe pneumonia. Since she was recently hospitalized she was started on broad spectrum abx,vanco and aztreonam for HCAP.  Assessment/Plan:    Principal Problem:  Sepsis due to pneumonia ([place]) / HCAP (healthcare-associated pneumonia)/ Klebsiella UTI - Sepsis criteria met on admission with hypotension, tachypnea, lactic acidosis and evidence of infection on CXR, left lower lobe pneumonia and Klebsiella UTI  - Pneumonia and sepsis order set utilized - Due to allergy to PCN we started vanco, aztreonam, today is day # 3 of ABX, stop Vanc today and transition to oral Levaquin which soul be adequate in coverage  - Follow up blood culture results - Follow up legionella, strep pneumonia, influenza, resp culture  Active Problems:  Primary malignant neoplasm of left lower lobe of lung ([place]) - S/P radiation therapy - outpatient follow up is reasonable  - d/w daughter findings on CT chest  - pt clear she wants no further interventions    Dementia without  behavioral disturbance  - Continue aricept   History of CVA (cerebrovascular accident) - Continue aspirin and plavix   Essential hypertension - Continue Norvasc, coreg, clonidine - add hydralazine as needed for better BP control     Hypokalemia  - supplement and repeat BMP in AM   Anxiety and depression - Continue lexapro   COPD (chronic obstructive pulmonary disease) (HCC) - Not in acute exacerbation - May use duoneb as needed for shortness of breath or wheezing   IDA (iron deficiency anemia) - Continue ferrous sulfate supplementation    Thrombocytosis - reactive - resolved    Dyslipidemia - Continue statin therapy  DVT prophylaxis - SCD's  Code Status: DNR Family Communication:  plan of care discussed with the patient  Disposition Plan: SNF 2/20 if bed available    IV access:  Peripheral IV  Procedures and diagnostic studies:     Ct Angio Chest Pe W/cm &/or Wo Cm 08/18/2015 No evidence pulmonary embolism, multiple nodules anteriorly in the left upper lobe, consolidation in the posterior inferior right lower lobe, and scattered areas of parenchymal scarring throughout both lungs. 3. New 6 mm nodule in the posterior inferior right lower lobe. No new nodules elsewhere. 4. Stable mediastinal and right hilar lymphadenopathy. 5. Normal heart size with left ventricular hypertrophy. Three vessel coronary atherosclerosis. Severe thoracic and upper abdominal aortic atherosclerosis with calcified and noncalcified plaque without evidence of aneurysm.   Dg Chest Portable 1 View 08/18/2015 New opacity at the left lung base. This is consistent with new/recurrent pneumonia likely with associated pleural fluid.  Medical Consultants:  None   Other  Consultants:  None  IAnti-Infectives:   Vancomycin 2/17 --> 2/19 Aztreonam 2/17 --> 2/19 Levaquin 2/19 -->  Faye Ramsay, MD  [facility] Pager [phone]  If 7PM-7AM, please contact night-coverage [email or web address] Password  [email or web address] 08/20/2015, 1:21 PM   LOS: 2 days   HPI/Subjective: No events overnight.   Objective: Filed Vitals:   08/19/15 1753 08/19/15 1830 08/19/15 1949 08/20/15 0626  BP: 195/66 184/54 118/47 141/60  Pulse:   66 66  Temp:   97.9 F (36.6 C) 97.8 F (36.6 C)  TempSrc:   Oral Oral  Resp:   18 20  Height:      Weight:    80.4 kg (177 lb 4 oz)  SpO2:   99% 97%    Intake/Output Summary (Last 24 hours) at 08/20/15 1321 Last data filed at 08/20/15 1310  Gross per 24 hour  Intake    390 ml  Output      0 ml  Net    390 ml    Exam:   General:  Pt is alert, follows commands appropriately, not in acute distress  Cardiovascular: Regular rate and rhythm, no rubs, no gallops  Respiratory: Clear to auscultation bilaterally, minimal rales at bases, no wheezing   Abdomen: Soft, non tender, non distended, bowel sounds present, no guarding  Extremities: pulses DP and PT palpable bilaterally  Neuro: Grossly nonfocal  Data Reviewed: Basic Metabolic Panel:  Recent Labs Lab 08/14/15 1637 08/18/15 1136 08/19/15 0522 08/20/15 0544  NA 136 141 137 141  K 3.7 3.8 2.8* 4.4  CL 101 107 107 109  CO2 '27 24 24 25  ' GLUCOSE 100* 104* 93 90  BUN '18 13 8 17  ' CREATININE 0.93 0.81 0.69 0.87  CALCIUM 10.1 10.0 9.0 9.3  MG  --   --   --  1.9   Liver Function Tests:  Recent Labs Lab 08/14/15 1637 08/18/15 1136  AST 17 23  ALT 15 13*  ALKPHOS 123* 112  BILITOT 0.4 0.7  PROT 6.9 7.1  ALBUMIN 3.7 3.3*   CBC:  Recent Labs Lab 08/14/15 1637 08/18/15 1136 08/19/15 0522 08/20/15 0544  WBC 9.5 6.8 5.6 6.6  NEUTROABS 7.1 4.8  --   --   HGB 11.4* 11.2* 10.3* 9.8*  HCT 35.3* 36.2 33.3* 31.2*  MCV 83.5 88.7 89.0 88.9  PLT 448.0* 389 316 304   Cardiac Enzymes:  Recent Labs Lab 08/18/15 1317  TROPONINI <0.03   CBG:  Recent Labs Lab 08/19/15 0751 08/20/15 0736  GLUCAP 125* 87   Recent Results (from the past 240 hour(s))  Urine Culture     Status: None   Collection  Time: 08/15/15 11:39 AM  Result Value Ref Range Status   Culture KLEBSIELLA PNEUMONIAE  Final   Colony Count >=100,000 COLONIES/ML  Final   Organism ID, Bacteria KLEBSIELLA PNEUMONIAE  Final      Susceptibility   Klebsiella pneumoniae -  (no method available)    AMPICILLIN >=32 Resistant     AMOX/CLAVULANIC 4 Sensitive     AMPICILLIN/SULBACTAM 16 Intermediate     PIP/TAZO 16 Sensitive     IMIPENEM <=0.25 Sensitive     CEFAZOLIN <=4 Not Reportable     CEFTRIAXONE <=1 Sensitive     CEFTAZIDIME <=1 Sensitive     CEFEPIME <=1 Sensitive     GENTAMICIN <=1 Sensitive     TOBRAMYCIN <=1 Sensitive     CIPROFLOXACIN <=0.25 Sensitive     LEVOFLOXACIN 1 Sensitive     NITROFURANTOIN 64  Intermediate     TRIMETH/SULFA* <=20 Sensitive      * NR=NOT REPORTABLE,SEE COMMENTORAL therapy:A cefazolin MIC of <32 predicts susceptibility to the oral agents cefaclor,cefdinir,cefpodoxime,cefprozil,cefuroxime,cephalexin,and loracarbef when used for therapy of uncomplicated UTIs due to E.coli,K.pneumomiae,and P.mirabilis. PARENTERAL therapy: A cefazolinMIC of >8 indicates resistance to parenteralcefazolin. An alternate test method must beperformed to confirm susceptibility to parenteralcefazolin.  Blood Culture (routine x 2)     Status: None (Preliminary result)   Collection Time: 08/18/15 11:36 AM  Result Value Ref Range Status   Specimen Description BLOOD RIGHT ANTECUBITAL  Final   Special Requests BOTTLES DRAWN AEROBIC AND ANAEROBIC 10CC  Final   Culture   Final    NO GROWTH < 24 HOURS Performed at [facility]    Report Status PENDING  Incomplete  Blood Culture (routine x 2)     Status: None (Preliminary result)   Collection Time: 08/18/15 11:36 AM  Result Value Ref Range Status   Specimen Description BLOOD LEFT WRIST  Final   Special Requests BOTTLES DRAWN AEROBIC AND ANAEROBIC 5CC  Final   Culture   Final    NO GROWTH < 24 HOURS Performed at [facility]    Report Status PENDING   Incomplete  Urine culture     Status: None (Preliminary result)   Collection Time: 08/18/15  3:10 PM  Result Value Ref Range Status   Specimen Description URINE, CLEAN CATCH  Final   Special Requests NONE  Final   Culture   Final    NO GROWTH < 24 HOURS Performed at [facility]    Report Status PENDING  Incomplete     Scheduled Meds: . amitriptyline  25 mg Oral QHS  . amLODipine  2.5 mg Oral Daily  . aspirin EC  81 mg Oral Daily  . aztreonam  1 g Intravenous Q8H  . carvedilol  6.25 mg Oral BID WC  . clopidogrel  75 mg Oral Daily  . donepezil  15 mg Oral QHS  . escitalopram  10 mg Oral Daily  . ferrous sulfate  325 mg Oral Daily  . fluticasone furoate-vilanterol  1 puff Inhalation Daily  . levothyroxine  125 mcg Oral QAC breakfast  . mesalamine  1.2 g Oral QHS  . pantoprazole  40 mg Oral QPM  . simvastatin  10 mg Oral q1800  . sodium chloride flush  3 mL Intravenous Q12H   Continuous Infusions:

## 2015-08-21 DIAGNOSIS — C3490 Malignant neoplasm of unspecified part of unspecified bronchus or lung: Secondary | ICD-10-CM

## 2015-08-21 LAB — CBC
HCT: 32.3 % — ABNORMAL LOW (ref 36.0–46.0)
HEMOGLOBIN: 9.8 g/dL — AB (ref 12.0–15.0)
MCH: 27.8 pg (ref 26.0–34.0)
MCHC: 30.3 g/dL (ref 30.0–36.0)
MCV: 91.5 fL (ref 78.0–100.0)
Platelets: 307 10*3/uL (ref 150–400)
RBC: 3.53 MIL/uL — AB (ref 3.87–5.11)
RDW: 19.7 % — ABNORMAL HIGH (ref 11.5–15.5)
WBC: 5.8 10*3/uL (ref 4.0–10.5)

## 2015-08-21 LAB — LEGIONELLA ANTIGEN, URINE

## 2015-08-21 LAB — GLUCOSE, CAPILLARY: GLUCOSE-CAPILLARY: 91 mg/dL (ref 65–99)

## 2015-08-21 LAB — BASIC METABOLIC PANEL
Anion gap: 8 (ref 5–15)
BUN: 17 mg/dL (ref 6–20)
CHLORIDE: 109 mmol/L (ref 101–111)
CO2: 23 mmol/L (ref 22–32)
Calcium: 9.1 mg/dL (ref 8.9–10.3)
Creatinine, Ser: 0.88 mg/dL (ref 0.44–1.00)
GFR calc Af Amer: >60 mL/min (ref >60)
GFR calc non Af Amer: 60 mL/min — ABNORMAL LOW (ref >60)
Glucose, Bld: 82 mg/dL (ref 65–99)
POTASSIUM: 4.3 mmol/L (ref 3.5–5.1)
SODIUM: 140 mmol/L (ref 135–145)

## 2015-08-21 NOTE — Care Management Important Message (Signed)
Important Message  Patient Details  Name: AARIEL EMS MRN: [id number] Date of Birth: [birth year]   Medicare Important Message Given:  Yes    Camillo Flaming 08/21/2015, 3:15 [place] Message  Patient Details  Name: TESSI EUSTACHE MRN: [id number] Date of Birth: [birth year]   Medicare Important Message Given:  Yes    Camillo Flaming 08/21/2015, 3:15 PM

## 2015-08-21 NOTE — Progress Notes (Signed)
CSW confirmed bed at [facility] at [facility] under ortho bundle. CSW confirmed bundle with ortho CM, Jill.   CSW to facilitate pt discharge when pt medically ready.  Alison Murray, MSW, [place] Work [phone]

## 2015-08-21 NOTE — Progress Notes (Signed)
Patient ID: Jodi Frazier, female   DOB: September 08, 1930, 82 y.o.   MRN: [id number]  [facility] PROGRESS NOTE  Jodi Frazier:[id number] DOB: July 03, 1930 DOA: 08/18/2015 PCP: Leeanne Rio, PA-C   Brief narrative:    82 year old female with past medical history of dementia, hypertension, lung adenocarcinoma, COPD, anxiety, just recently hospitalized for UTI and pneumonia who presented to [facility] ED with shortness of breath especially with exertion, intermittent associated chest pain with cough. She had subjective fevers. No chills. No palpitations. No lightheadedness or falls. She was just recently on Levaquin she apparently took for past 4 days but no significant symptomatic relief. No nausea or vomiting. No blood in stool or urine. No diarrhea.   In ED, BP was 90/55, HR 67, RR 20, T max 97.7 F, oxygen saturation 97% with [place] oxygen support. Blood work showed hemoglobin of 11.2, normal creatinine, lactic acid 3.22. CXR showed left lower lobe pneumonia. Since she was recently hospitalized she was started on broad spectrum abx,vanco and aztreonam for HCAP.  Assessment/Plan:    Principal Problem:  Sepsis due to pneumonia ([place]) / HCAP (healthcare-associated pneumonia)/ Klebsiella UTI - Sepsis criteria met on admission with hypotension, tachypnea, lactic acidosis and evidence of infection on CXR, left lower lobe pneumonia and Klebsiella UTI  - Pneumonia and sepsis order set utilized - continue with Levaquin therapy  - Follow up legionella, strep pneumonia, influenza, resp culture, all negative to date  Active Problems:  Primary malignant neoplasm of left lower lobe of lung ([place]) - S/P radiation therapy - outpatient follow up is reasonable  - d/w daughter findings on CT chest  - pt clear she wants no further interventions    Dementia without behavioral disturbance  - Continue aricept   History of CVA (cerebrovascular accident) - Continue aspirin and plavix   Essential  hypertension - Continue Norvasc, coreg, clonidine - added hydralazine as needed for better BP control     Hypokalemia  - supplemented and WNL    Anxiety and depression - Continue lexapro   COPD (chronic obstructive pulmonary disease) (HCC) - Not in acute exacerbation - May use duoneb as needed for shortness of breath or wheezing   IDA (iron deficiency anemia) - Continue ferrous sulfate supplementation    Thrombocytosis - reactive - resolved    Dyslipidemia - Continue statin therapy  DVT prophylaxis - SCD's  Code Status: DNR Family Communication:  plan of care discussed with the patient  Disposition Plan: SNF 2/21 if bed available    IV access:  Peripheral IV  Procedures and diagnostic studies:     Ct Angio Chest Pe W/cm &/or Wo Cm 08/18/2015 No evidence pulmonary embolism, multiple nodules anteriorly in the left upper lobe, consolidation in the posterior inferior right lower lobe, and scattered areas of parenchymal scarring throughout both lungs. 3. New 6 mm nodule in the posterior inferior right lower lobe. No new nodules elsewhere. 4. Stable mediastinal and right hilar lymphadenopathy. 5. Normal heart size with left ventricular hypertrophy. Three vessel coronary atherosclerosis. Severe thoracic and upper abdominal aortic atherosclerosis with calcified and noncalcified plaque without evidence of aneurysm.   Dg Chest Portable 1 View 08/18/2015 New opacity at the left lung base. This is consistent with new/recurrent pneumonia likely with associated pleural fluid.  Medical Consultants:  None   Other Consultants:  None  IAnti-Infectives:   Vancomycin 2/17 --> 2/19 Aztreonam 2/17 --> 2/19 Levaquin 2/19 -->  Faye Ramsay, MD  [facility] Pager [phone]  If 7PM-7AM, please  contact night-coverage [email or web address] Password [email or web address] 08/21/2015, 12:24 PM   LOS: 3 days   HPI/Subjective: No events overnight.   Objective: Filed Vitals:   08/20/15 2204 08/21/15 0629  08/21/15 0834 08/21/15 1000  BP: 158/68 178/62 137/42   Pulse: 66 70    Temp: 97.7 F (36.5 C) 97.6 F (36.4 C)    TempSrc: Oral Oral    Resp: 18 18    Height:      Weight:  79.7 kg (175 lb 11.3 oz)    SpO2: 97% 97%  97%    Intake/Output Summary (Last 24 hours) at 08/21/15 1224 Last data filed at 08/21/15 0018  Gross per 24 hour  Intake    360 ml  Output    600 ml  Net   -240 ml    Exam:   General:  Pt is alert, follows commands appropriately, not in acute distress  Cardiovascular: Regular rate and rhythm, no rubs, no gallops  Respiratory: Clear to auscultation bilaterally, minimal rales at bases, diminished breath sounds at bases   Abdomen: Soft, non tender, non distended, bowel sounds present, no guarding  Extremities: pulses DP and PT palpable bilaterally  Neuro: Grossly nonfocal  Data Reviewed: Basic Metabolic Panel:  Recent Labs Lab 08/14/15 1637 08/18/15 1136 08/19/15 0522 08/20/15 0544 08/21/15 0502  NA 136 141 137 141 140  K 3.7 3.8 2.8* 4.4 4.3  CL 101 107 107 109 109  CO2 _0 GLUCOSE 100* 104* 93 90 82  BUN _1 CREATININE 0.93 0.81 0.69 0.87 0.88  CALCIUM 10.1 10.0 9.0 9.3 9.1  MG  --   --   --  1.9  --    Liver Function Tests:  Recent Labs Lab 08/14/15 1637 08/18/15 1136  AST 17 23  ALT 15 13*  ALKPHOS 123* 112  BILITOT 0.4 0.7  PROT 6.9 7.1  ALBUMIN 3.7 3.3*   CBC:  Recent Labs Lab 08/14/15 1637 08/18/15 1136 08/19/15 0522 08/20/15 0544 08/21/15 0502  WBC 9.5 6.8 5.6 6.6 5.8  NEUTROABS 7.1 4.8  --   --   --   HGB 11.4* 11.2* 10.3* 9.8* 9.8*  HCT 35.3* 36.2 33.3* 31.2* 32.3*  MCV 83.5 88.7 89.0 88.9 91.5  PLT 448.0* 389 316 304 307   Cardiac Enzymes:  Recent Labs Lab 08/18/15 1317  TROPONINI <0.03   CBG:  Recent Labs Lab 08/19/15 0751 08/20/15 0736 08/21/15 0803  GLUCAP 125* 87 91   Recent Results (from the past 240 hour(s))  Urine Culture     Status: None   Collection Time:  08/15/15 11:39 AM  Result Value Ref Range Status   Culture KLEBSIELLA PNEUMONIAE  Final   Colony Count >=100,000 COLONIES/ML  Final   Organism ID, Bacteria KLEBSIELLA PNEUMONIAE  Final      Susceptibility   Klebsiella pneumoniae -  (no method available)    AMPICILLIN >=32 Resistant     AMOX/CLAVULANIC 4 Sensitive     AMPICILLIN/SULBACTAM 16 Intermediate     PIP/TAZO 16 Sensitive     IMIPENEM <=0.25 Sensitive     CEFAZOLIN <=4 Not Reportable     CEFTRIAXONE <=1 Sensitive     CEFTAZIDIME <=1 Sensitive     CEFEPIME <=1 Sensitive     GENTAMICIN <=1 Sensitive     TOBRAMYCIN <=1 Sensitive     CIPROFLOXACIN <=0.25 Sensitive     LEVOFLOXACIN 1 Sensitive     NITROFURANTOIN 64 Intermediate  TRIMETH/SULFA* <=20 Sensitive      * NR=NOT REPORTABLE,SEE COMMENTORAL therapy:A cefazolin MIC of <32 predicts susceptibility to the oral agents cefaclor,cefdinir,cefpodoxime,cefprozil,cefuroxime,cephalexin,and loracarbef when used for therapy of uncomplicated UTIs due to E.coli,K.pneumomiae,and P.mirabilis. PARENTERAL therapy: A cefazolinMIC of >8 indicates resistance to parenteralcefazolin. An alternate test method must beperformed to confirm susceptibility to parenteralcefazolin.  Blood Culture (routine x 2)     Status: None (Preliminary result)   Collection Time: 08/18/15 11:36 AM  Result Value Ref Range Status   Specimen Description BLOOD RIGHT ANTECUBITAL  Final   Special Requests BOTTLES DRAWN AEROBIC AND ANAEROBIC 10CC  Final   Culture   Final    NO GROWTH 2 DAYS Performed at [facility]    Report Status PENDING  Incomplete  Blood Culture (routine x 2)     Status: None (Preliminary result)   Collection Time: 08/18/15 11:36 AM  Result Value Ref Range Status   Specimen Description BLOOD LEFT WRIST  Final   Special Requests BOTTLES DRAWN AEROBIC AND ANAEROBIC 5CC  Final   Culture   Final    NO GROWTH 2 DAYS Performed at [facility]    Report Status PENDING  Incomplete   Urine culture     Status: None   Collection Time: 08/18/15  3:10 PM  Result Value Ref Range Status   Specimen Description URINE, CLEAN CATCH  Final   Special Requests NONE  Final   Culture   Final    NO GROWTH 2 DAYS Performed at [facility]    Report Status 08/20/2015 FINAL  Final     Scheduled Meds: . amitriptyline  25 mg Oral QHS  . amLODipine  2.5 mg Oral Daily  . aspirin EC  81 mg Oral Daily  . carvedilol  6.25 mg Oral BID WC  . clopidogrel  75 mg Oral Daily  . donepezil  15 mg Oral QHS  . escitalopram  10 mg Oral Daily  . feeding supplement (ENSURE ENLIVE)  237 mL Oral BID BM  . ferrous sulfate  325 mg Oral Daily  . fluticasone furoate-vilanterol  1 puff Inhalation Daily  . levofloxacin  750 mg Oral Daily  . levothyroxine  125 mcg Oral QAC breakfast  . mesalamine  1.2 g Oral QHS  . pantoprazole  40 mg Oral QPM  . simvastatin  10 mg Oral q1800  . sodium chloride flush  3 mL Intravenous Q12H   Continuous Infusions:

## 2015-08-21 NOTE — Telephone Encounter (Signed)
Charge or no charge?

## 2015-08-21 NOTE — Telephone Encounter (Signed)
No charge. 

## 2015-08-21 NOTE — Progress Notes (Signed)
Physical Therapy Treatment Patient Details Name: VAUDA SALVUCCI MRN: [id number] DOB: 11-09-1930 Today's Date: 08/21/2015    History of Present Illness 82 yo female admitted with sepsis. Hx of dementia, HTN, lung cancer, COPD, L THA 07/2015    PT Comments    Progressing very slowly. Pt is weak and fatigues quickly. Limited by some dizziness on today. Assisted pt into recliner at end of session and encouraged her to try to sit for an hour. Continue to recommend SNF.   Follow Up Recommendations  SNF     Equipment Recommendations  None recommended by PT    Recommendations for Other Services       Precautions / Restrictions Precautions Precautions: Fall Restrictions Weight Bearing Restrictions: No LLE Weight Bearing: Partial weight bearing    Mobility  Bed Mobility Overal bed mobility: Needs Assistance Bed Mobility: Supine to Sit     Supine to sit: Min assist     General bed mobility comments: assist for trunk. Increased time. used bedrail  Transfers Overall transfer level: Needs assistance Equipment used: Rolling walker (2 wheeled) Transfers: Sit to/from [facility] Sit to Stand: Min assist;From elevated surface Stand pivot transfers: Min assist       General transfer comment: assist to rise, stabilize, control descent. vcs safety, hand placement. Stand pivot bed to recliner with RW  Ambulation/Gait Ambulation/Gait assistance: Min assist Ambulation Distance (Feet): 15 Feet Assistive device: Rolling walker (2 wheeled) Gait Pattern/deviations: Decreased stride length;Step-through pattern     General Gait Details: assist to stabilize. slow gait speed. pt fatigues quickly/easily. Pt c/o dizziness towards end of distance today.   Stairs            Wheelchair Mobility    Modified Rankin (Stroke Patients Only)       Balance                                    Cognition Arousal/Alertness: Awake/alert Behavior During  Therapy: WFL for tasks assessed/performed Overall Cognitive Status: Within Functional Limits for tasks assessed                      Exercises Total Joint Exercises Ankle Circles/Pumps: AROM;Both;10 reps;Seated    General Comments        Pertinent Vitals/Pain Pain Assessment: Faces Faces Pain Scale: Hurts a little bit Pain Location: L LE Pain Descriptors / Indicators: Sore Pain Intervention(s): Monitored during session;Limited activity within patient's tolerance;Repositioned    Home Living                      Prior Function            PT Goals (current goals can now be found in the care plan section) Progress towards PT goals: Progressing toward goals    Frequency  Min 3X/week    PT Plan Current plan remains appropriate    Co-evaluation             End of Session Equipment Utilized During Treatment: Gait belt Activity Tolerance: Patient limited by fatigue Patient left: in chair;with call bell/phone within reach;with chair alarm set     Time: 7510-2585 PT Time Calculation (min) (ACUTE ONLY): 21 min  Charges:  $Gait Training: 8-22 mins                    G Codes:      Weston Anna,  MPT Pager: [phone]

## 2015-08-21 NOTE — Progress Notes (Signed)
CSW continuing to follow.   CSW spoke with pt daughter regarding disposition planning. Pt daughter discussed that pt is an ortho bundle and Dr. Alvan Dame recommending pt to return home with home health and 24 hour care, but PT has recommended SNF and attending MD, Dr. Doyle Askew has been documenting SNF at d/c. Pt daughter states that she has left a message with attending MD, Dr. Doyle Askew to clarify pt antibiotics because pt has been on Levaquin as an outpatient and completed courses of the antibiotic, but continues to get Pneumonia. Pt daughter states that depending on this discussion with attending MD will depend on d/c home vs. SNF. Pt has bed at [facility] if SNF decided upon for d/c plan.   CSW confirmed with [facility] at [facility] that bed available if needed upon discharge.  CSW to continue to follow to provide support and assist with pt disposition planning.  Alison Murray, MSW, [place] Work [phone]

## 2015-08-22 LAB — CBC
HEMATOCRIT: 33.1 % — AB (ref 36.0–46.0)
HEMOGLOBIN: 10.4 g/dL — AB (ref 12.0–15.0)
MCH: 28 pg (ref 26.0–34.0)
MCHC: 31.4 g/dL (ref 30.0–36.0)
MCV: 89.2 fL (ref 78.0–100.0)
Platelets: 332 10*3/uL (ref 150–400)
RBC: 3.71 MIL/uL — AB (ref 3.87–5.11)
RDW: 19.6 % — ABNORMAL HIGH (ref 11.5–15.5)
WBC: 7 10*3/uL (ref 4.0–10.5)

## 2015-08-22 LAB — BASIC METABOLIC PANEL
Anion gap: 9 (ref 5–15)
BUN: 16 mg/dL (ref 6–20)
CALCIUM: 9.5 mg/dL (ref 8.9–10.3)
CHLORIDE: 107 mmol/L (ref 101–111)
CO2: 24 mmol/L (ref 22–32)
Creatinine, Ser: 0.95 mg/dL (ref 0.44–1.00)
GFR calc Af Amer: >60 mL/min (ref >60)
GFR, EST NON AFRICAN AMERICAN: 54 mL/min — AB (ref >60)
GLUCOSE: 98 mg/dL (ref 65–99)
POTASSIUM: 3.9 mmol/L (ref 3.5–5.1)
Sodium: 140 mmol/L (ref 135–145)

## 2015-08-22 LAB — URINALYSIS, ROUTINE W REFLEX MICROSCOPIC
Bilirubin Urine: NEGATIVE
GLUCOSE, UA: NEGATIVE mg/dL
Hgb urine dipstick: NEGATIVE
KETONES UR: NEGATIVE mg/dL
LEUKOCYTES UA: NEGATIVE
NITRITE: NEGATIVE
PH: 6 (ref 5.0–8.0)
Protein, ur: NEGATIVE mg/dL
SPECIFIC GRAVITY, URINE: 1.019 (ref 1.005–1.030)

## 2015-08-22 LAB — GLUCOSE, CAPILLARY: Glucose-Capillary: 116 mg/dL — ABNORMAL HIGH (ref 65–99)

## 2015-08-22 MED ORDER — ALPRAZOLAM 0.25 MG PO TABS: 0.1250 mg | ORAL_TABLET | Freq: Two times a day (BID) | ORAL | Status: AC | PRN

## 2015-08-22 MED ORDER — LEVOFLOXACIN 750 MG PO TABS: 750.0000 mg | ORAL_TABLET | Freq: Every day | ORAL | Status: AC

## 2015-08-22 MED ORDER — CEPHALEXIN 500 MG PO CAPS: ORAL_CAPSULE | ORAL | Status: AC

## 2015-08-22 MED ORDER — OXYCODONE HCL 5 MG PO TABS: 2.5000 mg | ORAL_TABLET | ORAL | Status: AC | PRN

## 2015-08-22 NOTE — Progress Notes (Signed)
Pt discharging home with [facility] Go, bundle plan from MD's office.  Spoke with York Cerise pt's daughter on the telephone concerning discharge plans.

## 2015-08-22 NOTE — Progress Notes (Signed)
Went over all discharge information with patient and daughter.  All questions answered.  Prescriptions given.  Pt wheeled out by NT.

## 2015-08-22 NOTE — Discharge Summary (Signed)
Physician Discharge Summary  Jodi Frazier JYN:[id number] DOB: May 09, 1931 DOA: 08/18/2015  PCP: Leeanne Rio, PA-C  Admit date: 08/18/2015 Discharge date: 08/22/2015  Recommendations for Outpatient Follow-up:  1. Pt will need to follow up with PCP in 2-3 weeks post discharge 2. Please obtain BMP to evaluate electrolytes and kidney function 3. Please also check CBC to evaluate Hg and Hct levels 4. Per urologist on call dr. Louis Meckel, ok to complete therapy with Levaquin and start prophylactic ABX Keflex as previously recommended by Dr. Jeffie Pollock  Discharge Diagnoses:  Principal Problem:   Sepsis due to pneumonia [facility]) Active Problems:   Primary malignant neoplasm of left lower lobe of lung ([place])   Dementia   History of CVA (cerebrovascular accident)   Essential hypertension   HCAP (healthcare-associated pneumonia)   Anxiety and depression   S/P radiation therapy   Adenocarcinoma of lung (HCC)   COPD (chronic obstructive pulmonary disease) (HCC)   IDA (iron deficiency anemia)   Dyslipidemia  Discharge Condition: Stable  Diet recommendation: Heart healthy diet discussed in details    Brief narrative:    82 year old female with past medical history of dementia, hypertension, lung adenocarcinoma, COPD, anxiety, just recently hospitalized for UTI and pneumonia who presented to [facility] ED with shortness of breath especially with exertion, intermittent associated chest pain with cough. She had subjective fevers. No chills. No palpitations. No lightheadedness or falls. She was just recently on Levaquin she apparently took for past 4 days but no significant symptomatic relief. No nausea or vomiting. No blood in stool or urine. No diarrhea.   In ED, BP was 90/55, HR 67, RR 20, T max 97.7 F, oxygen saturation 97% with [place] oxygen support. Blood work showed hemoglobin of 11.2, normal creatinine, lactic acid 3.22. CXR showed left lower lobe pneumonia. Since she was recently hospitalized she was  started on broad spectrum abx,vanco and aztreonam for HCAP.  Assessment/Plan:    Principal Problem:  Sepsis due to pneumonia ([place]) / HCAP (healthcare-associated pneumonia)/ Klebsiella UTI - Sepsis criteria met on admission with hypotension, tachypnea, lactic acidosis and evidence of infection on CXR, left lower lobe pneumonia and Klebsiella UTI  - Pneumonia and sepsis order set utilized - continue with Levaquin therapy  - strep pneumonia, influenza, resp culture, all negative to date - Levaquin adequate for UTI as well as for PNA   Active Problems:  Primary malignant neoplasm of left lower lobe of lung ([place]) - S/P radiation therapy - outpatient follow up is reasonable  - d/w daughter findings on CT chest    Dementia without behavioral disturbance  - Continue aricept   History of CVA (cerebrovascular accident) - Continue aspirin and plavix   Essential hypertension - Continue Norvasc, coreg, clonidine   Hypokalemia  - supplemented and WNL    Anxiety and depression - Continue lexapro   COPD (chronic obstructive pulmonary disease) (HCC) - Not in acute exacerbation - May use duoneb as needed for shortness of breath or wheezing   IDA (iron deficiency anemia) - Continue ferrous sulfate supplementation   Thrombocytosis - reactive - resolved    Dyslipidemia - Continue statin therapy  DVT prophylaxis - SCD's  Code Status: DNR Family Communication: plan of care discussed with the patient  Disposition Plan: home   IV access:  Peripheral IV  Procedures and diagnostic studies:    Ct Angio Chest Pe W/cm &/or Wo Cm 08/18/2015 No evidence pulmonary embolism, multiple nodules anteriorly in the left upper lobe, consolidation in the posterior  inferior right lower lobe, and scattered areas of parenchymal scarring throughout both lungs. 3. New 6 mm nodule in the posterior inferior right lower lobe. No new nodules elsewhere. 4. Stable mediastinal and  right hilar lymphadenopathy. 5. Normal heart size with left ventricular hypertrophy. Three vessel coronary atherosclerosis. Severe thoracic and upper abdominal aortic atherosclerosis with calcified and noncalcified plaque without evidence of aneurysm.   Dg Chest Portable 1 View 08/18/2015 New opacity at the left lung base. This is consistent with new/recurrent pneumonia likely with associated pleural fluid.  Medical Consultants:  None   Other Consultants:  None  IAnti-Infectives:   Vancomycin 2/17 --> 2/19 Aztreonam 2/17 --> 2/19 Levaquin 2/19 -->       Discharge Exam: Filed Vitals:   08/22/15 0500 08/22/15 0506  BP: 189/63 159/66  Pulse: 72 70  Temp: 97.4 F (36.3 C)   Resp: 20    Filed Vitals:   08/22/15 0500 08/22/15 0506 08/22/15 0650 08/22/15 0954  BP: 189/63 159/66    Pulse: 72 70    Temp: 97.4 F (36.3 C)     TempSrc: Oral     Resp: 20     Height:      Weight:   79.6 kg (175 lb 7.8 oz)   SpO2: 95%   97%    General: Pt is alert, follows commands appropriately, not in acute distress Cardiovascular: Regular rate and rhythm, no rubs, no gallops Respiratory: Clear to auscultation bilaterally, no wheezing, diminished breath sounds at bases  Abdominal: Soft, non tender, non distended, bowel sounds +, no guarding  Discharge Instructions     Medication List    STOP taking these medications        HYDROcodone-acetaminophen 7.5-325 MG tablet  Commonly known as:  NORCO      TAKE these medications        acetaminophen 325 MG tablet  Commonly known as:  TYLENOL  Take 325 mg by mouth every 6 (six) hours as needed for headache.     albuterol 108 (90 Base) MCG/ACT inhaler  Commonly known as:  PROVENTIL HFA;VENTOLIN HFA  Inhale 2 puffs into the lungs every 6 (six) hours as needed for wheezing.     ALPRAZolam 0.25 MG tablet  Commonly known as:  XANAX  Take 0.5 tablets (0.125 mg total) by mouth 2 (two) times daily as needed.     amitriptyline 25 MG  tablet  Commonly known as:  ELAVIL  Take 1 tablet (25 mg total) by mouth at bedtime.     amLODipine 2.5 MG tablet  Commonly known as:  NORVASC  TAKE 1 TABLET BY MOUTH DAILY     aspirin 81 MG EC tablet  Take 1 tablet (81 mg total) by mouth daily.     BIOTIN PO  Take 1 tablet by mouth daily.     carvedilol 6.25 MG tablet  Commonly known as:  COREG  TAKE 1 TABLET BY MOUTH TWICE DAILY WITH A MEAL     celecoxib 100 MG capsule  Commonly known as:  CELEBREX  Take 1 capsule (100 mg total) by mouth 2 (two) times daily.     cephALEXin 500 MG capsule  Commonly known as:  KEFLEX  1 tab po q day     cloNIDine 0.1 MG tablet  Commonly known as:  CATAPRES  Take 0.1 mg by mouth daily as needed (high blood pressure).     clopidogrel 75 MG tablet  Commonly known as:  PLAVIX  Take 1 tablet (75 mg  total) by mouth daily.     cyanocobalamin 1000 MCG/ML injection  Commonly known as:  (VITAMIN B-12)  INJECT 1 ML (1,000 MCG TOTAL) INTO THE MUSCLE EVERY 30 (THIRTY) DAYS. LAST DOSE WAS ON 01-29-14     docusate sodium 100 MG capsule  Commonly known as:  COLACE  Take 1 capsule (100 mg total) by mouth 2 (two) times daily.     donepezil 10 MG tablet  Commonly known as:  ARICEPT  Take 1.5 tablets (15 mg total) by mouth at bedtime. Takes 1 and 1/2     escitalopram 10 MG tablet  Commonly known as:  LEXAPRO  TAKE 1 TABLET BY MOUTH DAILY     ferrous sulfate 325 (65 FE) MG tablet  Take 1 tablet (325 mg total) by mouth 3 (three) times daily after meals.     fluticasone furoate-vilanterol 100-25 MCG/INH Aepb  Commonly known as:  BREO ELLIPTA  Use 1 puff daily as directed.     gabapentin 100 MG capsule  Commonly known as:  NEURONTIN  Take 1 capsule (100 mg total) by mouth 2 (two) times daily.     levofloxacin 750 MG tablet  Commonly known as:  LEVAQUIN  Take 1 tablet (750 mg total) by mouth daily.     levothyroxine 125 MCG tablet  Commonly known as:  SYNTHROID  TAKE 1 TABLET (125 MCG TOTAL)  BY MOUTH DAILY BEFORE BREAKFAST.     lidocaine 5 %  Commonly known as:  LIDODERM  Place 1 patch onto the skin daily. Remove & Discard patch within 12 hours or as directed by MD     mesalamine 1.2 g EC tablet  Commonly known as:  LIALDA  Take 1 tablet (1.2 g total) by mouth 3 (three) times daily.     NEOSPORIN EX  Apply 1 application topically daily as needed (skin tears). USING DAILY TO RIGHT HAND SKIN TEAR     nitroGLYCERIN 0.4 MG SL tablet  Commonly known as:  NITROSTAT  Place 1 tablet (0.4 mg total) under the tongue every 5 (five) minutes as needed for chest pain.     ondansetron 8 MG tablet  Commonly known as:  ZOFRAN  Take 1 tablet (8 mg total) by mouth every 8 (eight) hours as needed for nausea or vomiting.     oxyCODONE 5 MG immediate release tablet  Commonly known as:  Oxy IR/ROXICODONE  Take 0.5 tablets (2.5 mg total) by mouth every 4 (four) hours as needed for moderate pain. Reported on 08/03/2015     pantoprazole 40 MG tablet  Commonly known as:  PROTONIX  TAKE 1 TABLET BY MOUTH EVERY MORNING     polyethylene glycol packet  Commonly known as:  MIRALAX / GLYCOLAX  Take 17 g by mouth 2 (two) times daily.     simvastatin 10 MG tablet  Commonly known as:  ZOCOR  TAKE 1 TABLET (10 MG TOTAL) BY MOUTH EVERY OTHER DAY **DISCONTINUE CRESTOR AND ZETIA**     tiZANidine 4 MG tablet  Commonly known as:  ZANAFLEX  Take 4 mg by mouth 4 (four) times daily as needed for muscle spasms.           Follow-up Information    Follow up with Leeanne Rio, PA-C.   Specialty:  Family Medicine   Contact information:   [address] [place] [address] [phone]        The results of significant diagnostics from this hospitalization (including imaging,  microbiology, ancillary and laboratory) are listed below for reference.     Microbiology: Recent Results (from the past 240 hour(s))  Urine Culture     Status: None   Collection Time: 08/15/15  11:39 AM  Result Value Ref Range Status   Culture KLEBSIELLA PNEUMONIAE  Final   Colony Count >=100,000 COLONIES/ML  Final   Organism ID, Bacteria KLEBSIELLA PNEUMONIAE  Final      Susceptibility   Klebsiella pneumoniae -  (no method available)    AMPICILLIN >=32 Resistant     AMOX/CLAVULANIC 4 Sensitive     AMPICILLIN/SULBACTAM 16 Intermediate     PIP/TAZO 16 Sensitive     IMIPENEM <=0.25 Sensitive     CEFAZOLIN <=4 Not Reportable     CEFTRIAXONE <=1 Sensitive     CEFTAZIDIME <=1 Sensitive     CEFEPIME <=1 Sensitive     GENTAMICIN <=1 Sensitive     TOBRAMYCIN <=1 Sensitive     CIPROFLOXACIN <=0.25 Sensitive     LEVOFLOXACIN 1 Sensitive     NITROFURANTOIN 64 Intermediate     TRIMETH/SULFA* <=20 Sensitive      * NR=NOT REPORTABLE,SEE COMMENTORAL therapy:A cefazolin MIC of <32 predicts susceptibility to the oral agents cefaclor,cefdinir,cefpodoxime,cefprozil,cefuroxime,cephalexin,and loracarbef when used for therapy of uncomplicated UTIs due to E.coli,K.pneumomiae,and P.mirabilis. PARENTERAL therapy: A cefazolinMIC of >8 indicates resistance to parenteralcefazolin. An alternate test method must beperformed to confirm susceptibility to parenteralcefazolin.  Blood Culture (routine x 2)     Status: None (Preliminary result)   Collection Time: 08/18/15 11:36 AM  Result Value Ref Range Status   Specimen Description BLOOD RIGHT ANTECUBITAL  Final   Special Requests BOTTLES DRAWN AEROBIC AND ANAEROBIC 10CC  Final   Culture   Final    NO GROWTH 3 DAYS Performed at [facility]    Report Status PENDING  Incomplete  Blood Culture (routine x 2)     Status: None (Preliminary result)   Collection Time: 08/18/15 11:36 AM  Result Value Ref Range Status   Specimen Description BLOOD LEFT WRIST  Final   Special Requests BOTTLES DRAWN AEROBIC AND ANAEROBIC 5CC  Final   Culture   Final    NO GROWTH 3 DAYS Performed at [facility]    Report Status PENDING  Incomplete  Urine culture      Status: None   Collection Time: 08/18/15  3:10 PM  Result Value Ref Range Status   Specimen Description URINE, CLEAN CATCH  Final   Special Requests NONE  Final   Culture   Final    NO GROWTH 2 DAYS Performed at [facility]    Report Status 08/20/2015 FINAL  Final     Labs: Basic Metabolic Panel:  Recent Labs Lab 08/18/15 1136 08/19/15 0522 08/20/15 0544 08/21/15 0502 08/22/15 0513  NA 141 137 141 140 140  K 3.8 2.8* 4.4 4.3 3.9  CL 107 107 109 109 107  CO2 '24 24 25 23 24  ' GLUCOSE 104* 93 90 82 98  BUN '13 8 17 17 16  ' CREATININE 0.81 0.69 0.87 0.88 0.95  CALCIUM 10.0 9.0 9.3 9.1 9.5  MG  --   --  1.9  --   --    Liver Function Tests:  Recent Labs Lab 08/18/15 1136  AST 23  ALT 13*  ALKPHOS 112  BILITOT 0.7  PROT 7.1  ALBUMIN 3.3*   No results for input(s): LIPASE, AMYLASE in the last 168 hours. No results for input(s): AMMONIA in the last  168 hours. CBC:  Recent Labs Lab 08/18/15 1136 08/19/15 0522 08/20/15 0544 08/21/15 0502 08/22/15 0513  WBC 6.8 5.6 6.6 5.8 7.0  NEUTROABS 4.8  --   --   --   --   HGB 11.2* 10.3* 9.8* 9.8* 10.4*  HCT 36.2 33.3* 31.2* 32.3* 33.1*  MCV 88.7 89.0 88.9 91.5 89.2  PLT 389 316 304 307 332   Cardiac Enzymes:  Recent Labs Lab 08/18/15 1317  TROPONINI <0.03   BNP: BNP (last 3 results)  Recent Labs  12/04/14 1812 08/18/15 1136  BNP 60.4 103.6*    ProBNP (last 3 results) No results for input(s): PROBNP in the last 8760 hours.  CBG:  Recent Labs Lab 08/19/15 0751 08/20/15 0736 08/21/15 0803 08/22/15 0755  GLUCAP 125* 87 91 116*     SIGNED: Time coordinating discharge: Over 30 minutes  Faye Ramsay, MD  [facility] 08/22/2015, 10:30 AM Pager [phone]  If 7PM-7AM, please contact night-coverage [email or web address] Password [email or web address]

## 2015-08-22 NOTE — Discharge Instructions (Signed)

## 2015-08-22 NOTE — Progress Notes (Signed)
CSW received notification that pt plans to discharge home with [facility], bundle plan through ortho office.   CSW confirmed with pt daughter, York Cerise.  CSW updated Pennybryn at [facility] that pt now d/c home.  Pt discharging today.  No further social work needs.  CSW signing off.   Alison Murray, MSW, [place] Work [phone]

## 2015-08-23 ENCOUNTER — Telehealth: Payer: Self-pay | Admitting: *Deleted

## 2015-08-23 LAB — CULTURE, BLOOD (ROUTINE X 2)
CULTURE: NO GROWTH
CULTURE: NO GROWTH

## 2015-08-23 LAB — URINE CULTURE: Culture: 1000

## 2015-08-23 MED FILL — levoFLOXacin 750 MG TABS: 750 | 5 days supply | Qty: 5 | Fill #0

## 2015-08-23 NOTE — Telephone Encounter (Signed)
Transition Care Management Follow-up Telephone Call   Date discharged? 08/22/15   How have you been since you were released from the hospital? "I'm very happy to be home. I was so exhausted I slept for 11 hours last night."   Do you understand why you were in the hospital? yes, "I had pneumonia and E. coli in my urine."   Do you understand the discharge instructions? yes   Where were you discharged to? Home   Items Reviewed:  Medications reviewed: no, pt declined. She states she does not know all of her medications and would not be able to go over them over the phone. She states she will bring all medications to her appt. Her daughter is a Marine scientist and helps her w/ her medications.  Allergies reviewed: yes  Dietary changes reviewed: yes, pt states increasing protein was recommended in the hospital but "I'm 84, it's not going to make any difference what I eat."  Referrals reviewed: Pt states no referrals.   Functional Questionnaire:   Activities of Daily Living (ADLs):   She states they are independent in the following: feeding and continence States they require assistance with the following: ambulation, bathing and hygiene, grooming, toileting and dressing, pt states she has 24 hr assistance.    Any transportation issues/concerns?: no   Any patient concerns? no   Confirmed importance and date/time of follow-up visits scheduled yes  Provider Appointment booked with Elyn Aquas 09/06/15 1:15pm  Confirmed with patient if condition begins to worsen call PCP or go to the ER.  Patient was given the office number and encouraged to call back with question or concerns.  : no, pt hung up before giving office number.

## 2015-08-23 NOTE — Telephone Encounter (Addendum)
Called pt to reschedule, explained to pt and her daughter that appt needs to be scheduled w/ in 14 days of discharge. Offered appt earlier in the week and the week prior. They refused to reschedule appt as pt already has another appt scheduled on 3/8 and since she is in a wheelchair, they want to do both appointments on the same day to minimize the number of times she has to get out of the house.

## 2015-08-23 NOTE — Telephone Encounter (Signed)
TCM must be done within 14 days of discharge. She needs to be seen earlier that week. 11:30 appointment if possible as that is designated for hospital follow-ups.

## 2015-08-25 NOTE — Progress Notes (Signed)
Pt's daughter York Cerise called to request HHRN,HHNT,SW services to help her mother at home. Referred to attending Dr. Doyle Askew. Referral given to in House rep. with [place].

## 2015-08-30 ENCOUNTER — Telehealth: Payer: Self-pay | Admitting: Physician Assistant

## 2015-08-30 ENCOUNTER — Other Ambulatory Visit (INDEPENDENT_AMBULATORY_CARE_PROVIDER_SITE_OTHER): Payer: Medicare Other

## 2015-08-30 DIAGNOSIS — R8290 Unspecified abnormal findings in urine: Secondary | ICD-10-CM | POA: Diagnosis not present

## 2015-08-30 LAB — POC URINALSYSI DIPSTICK (AUTOMATED)
Bilirubin, UA: NEGATIVE
Glucose, UA: NEGATIVE
Ketones, UA: POSITIVE
Leukocytes, UA: NEGATIVE
Nitrite, UA: NEGATIVE
RBC UA: NEGATIVE
UROBILINOGEN UA: 0.2
pH, UA: 6

## 2015-08-30 NOTE — Telephone Encounter (Signed)
Please advise.//AB/CMA 

## 2015-08-30 NOTE — Telephone Encounter (Signed)
Ok to order UA with urine culture. Will treat based on results.

## 2015-08-30 NOTE — Telephone Encounter (Signed)
Addendum -- results are in as lab ordered for [place]. See result note

## 2015-08-30 NOTE — Telephone Encounter (Signed)
Pt and daughter on the phone. They believe pt has another UTI. She states they have an extra urine specimen cup from last time and they are bringing up a sample. Advised pt that provider will have to enter orders. She states they are on the way here.

## 2015-08-31 ENCOUNTER — Telehealth: Payer: Self-pay | Admitting: Emergency Medicine

## 2015-08-31 ENCOUNTER — Encounter: Payer: Self-pay | Admitting: Emergency Medicine

## 2015-08-31 DIAGNOSIS — J439 Emphysema, unspecified: Secondary | ICD-10-CM

## 2015-08-31 DIAGNOSIS — C3432 Malignant neoplasm of lower lobe, left bronchus or lung: Secondary | ICD-10-CM

## 2015-08-31 NOTE — Telephone Encounter (Signed)
[place] x 1 for Manus Gunning

## 2015-08-31 NOTE — Telephone Encounter (Signed)
fram Returned call [phone]

## 2015-08-31 NOTE — Telephone Encounter (Signed)
Spoke with Manus Gunning at [facility]. They needed to know if RB will be pt's attending. Advised that he will and wants them to help with symptom management if needed. Nothing further was needed.

## 2015-09-01 ENCOUNTER — Encounter: Payer: Self-pay | Admitting: Emergency Medicine

## 2015-09-01 ENCOUNTER — Telehealth: Payer: Self-pay | Admitting: Critical Care Medicine

## 2015-09-01 ENCOUNTER — Encounter: Payer: Self-pay | Admitting: Physician Assistant

## 2015-09-01 LAB — CULTURE, URINE COMPREHENSIVE
COLONY COUNT: NO GROWTH
ORGANISM ID, BACTERIA: NO GROWTH

## 2015-09-01 NOTE — Telephone Encounter (Signed)
Pt's daughter York Cerise came in to office and dropped off Death Certificate to be completed by provider; called pt's daughter, York Cerise, and informed her when it was ready, understood & agreed/SLS

## 2015-09-01 NOTE — Telephone Encounter (Signed)
Call from EMS that patient expired at home 3/3 @ 12:52AM.  Patient was a DNR and cared for by Hospice.  EMS instructed to forward death certificate to Dr. Lamonte Sakai.

## 2015-09-06 ENCOUNTER — Inpatient Hospital Stay: Payer: Medicare Other | Admitting: Physician Assistant

## 2015-09-30 DEATH — deceased

## 2015-10-16 ENCOUNTER — Ambulatory Visit: Payer: Medicare Other | Admitting: Emergency Medicine

## 2015-10-20 ENCOUNTER — Other Ambulatory Visit: Payer: Self-pay

## 2015-10-20 NOTE — Patient Outreach (Signed)
[facility]) Care Management  10/20/2015  Jodi Frazier 15-Jan-1931 [id number]  Telephone call to patient regarding NextGen high risk referral. Unable to reach patient. HIPAA compliant voice message left with call back phone number.   PLAN:  RNCM will attempt 2nd telephone call to patient regarding high risk referral.  Quinn Plowman RN,BSN,CCM [facility] Telephonic  [phone]

## 2015-10-25 ENCOUNTER — Other Ambulatory Visit: Payer: Self-pay

## 2015-10-25 NOTE — Patient Outreach (Signed)
[facility]) Care Management  10/25/2015  Mareli A Kennerson 07/22/1930 [id number]   Second telephone call to patient regarding next gen readmission referral. Unable to reach patient or leave voice message. Phone only rang.  RNCM will attempt third telephone outreach to patient within  1 week.   Quinn Plowman RN,BSN,CCM [facility] Telephonic  [phone]

## 2015-10-31 ENCOUNTER — Ambulatory Visit: Payer: Self-pay

## 2015-11-01 ENCOUNTER — Other Ambulatory Visit: Payer: Self-pay

## 2015-11-01 NOTE — Patient Outreach (Signed)
[facility]) Care Management  11/01/2015  Jodi Frazier 11-Jun-1931 [id number]   Third telephone call to patient regarding NEXTGEN readmit referral. Unable to reach patient or leave voice message.  Phone only rings. Called patients listed primary MD office, Raiford Noble, PA,  Left message for return call.   PLAN:  RNCM will await return call from primary MD office. If not return call will call primary MD office within 1 week. RNCM will send patient outreach letter to attempt contact.   Quinn Plowman RN,BSN,CCM [facility] Telephonic  [phone]

## 2015-11-02 ENCOUNTER — Ambulatory Visit: Payer: Self-pay

## 2015-11-03 ENCOUNTER — Other Ambulatory Visit: Payer: Self-pay

## 2015-11-03 NOTE — Patient Outreach (Signed)
[facility]) Care Management  11/03/2015  Lorelei A Gato 04-Oct-1930 [id number]   Received return call from [place] with patients primary MD office.  Patients contact phone number confirmed with primary MD office.   PLAN:  Outreach letter has been sent to patient.  RNCM will await response.  If know response within 10 business days from letter being sent will close patient to [facility] care management.   Quinn Plowman RN,BSN,CCM [facility] Telephonic  [phone]

## 2015-12-04 NOTE — Telephone Encounter (Signed)
Unable to reach patient.

## 2015-12-11 ENCOUNTER — Other Ambulatory Visit: Payer: Self-pay

## 2015-12-11 NOTE — Patient Outreach (Signed)
[facility]) Care Management  12/11/2015  Jodi Frazier 26-Dec-1930 [id number]  No response from patient after 3 telephone calls and outreach letter attempt.  PLAN:  RNCM will refer patient to Josepha Pigg to close due to inability to establish contact with patient. RNCM will notify patients primary MD of closure and closure reason.   Quinn Plowman RN,BSN,CCM [facility] Telephonic  [phone]

## 2017-02-22 IMAGING — CT CT ANGIO CHEST
2 of 7 series · 16 of 36 positions shown · IV contrast (OMNIPAQUE 350)
Comparison: CT chest 12/04/2014 and earlier. PET-CT 04/04/2015 and
earlier.

CLINICAL DATA: 84-year-old with personal history of adenocarcinoma
of the left lower lobe diagnosed in August 2012, post radiation
therapy, presenting with acute onset of cough, chest congestion,
shortness of breath and nausea. Patient discharged from the hospital
approximately 2 weeks ago after left total hip arthroplasty.

EXAM:
CT ANGIOGRAPHY CHEST WITH CONTRAST
TECHNIQUE: Multidetector CT imaging of the chest was performed using the
standard protocol during bolus administration of intravenous
contrast. Multiplanar CT image reconstructions and MIPs were
obtained to evaluate the vascular anatomy.
CONTRAST:  72mL OMNIPAQUE IOHEXOL 350 MG/ML IV.

[Series 5: coronal mpr · coronal · 0.67mm/px · 1 of 124 slices shown]
[im 62/124  mediastinal]
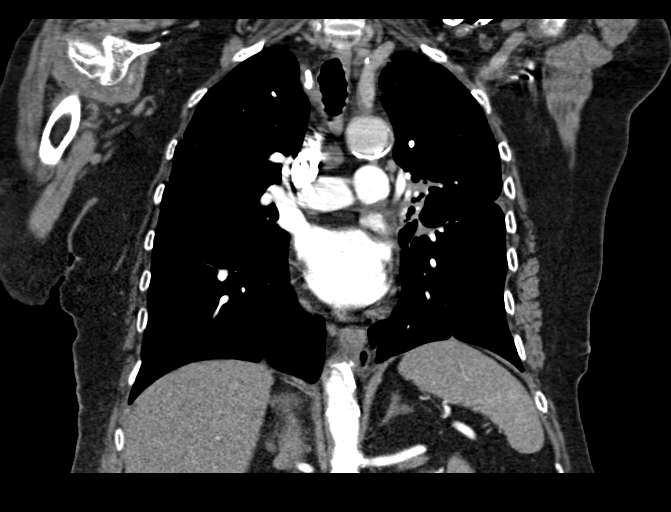

[Series 10: thins for pacs · axial · 0.67mm/px · z∈[+1464,+1730]mm · 15 of 304 slices shown]
[im 19/304  lung]
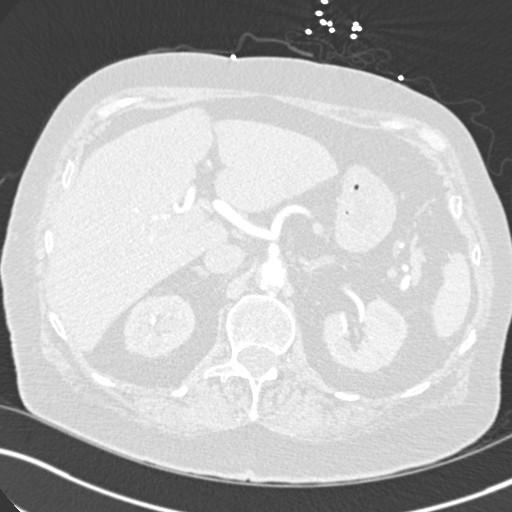
[im 38/304  mediastinal]
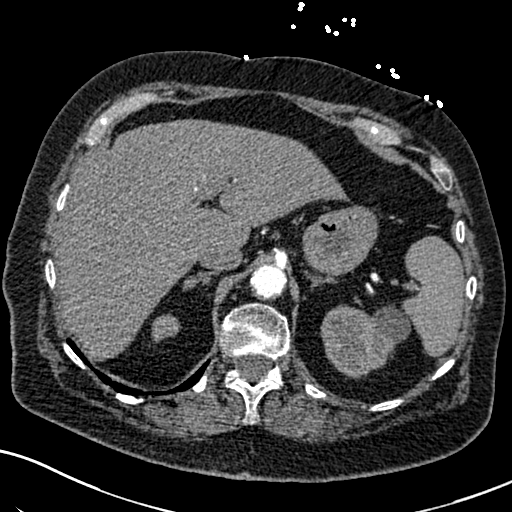
[im 57/304  lung]
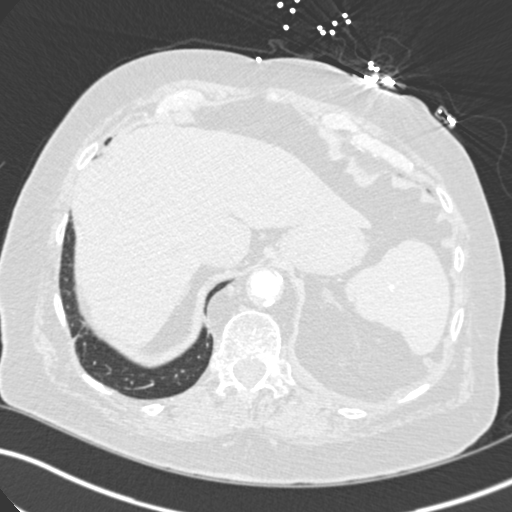
[im 76/304  mediastinal]
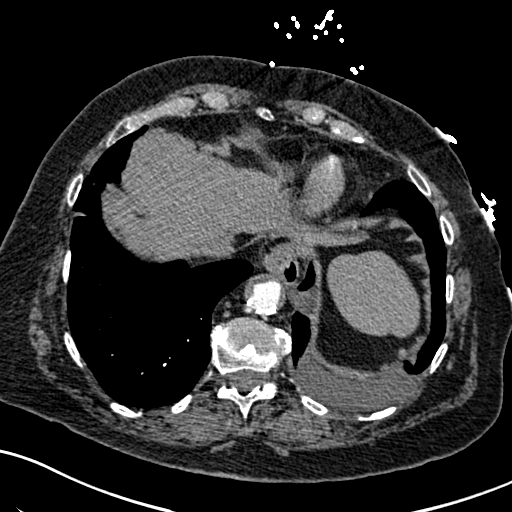
[im 95/304  lung]
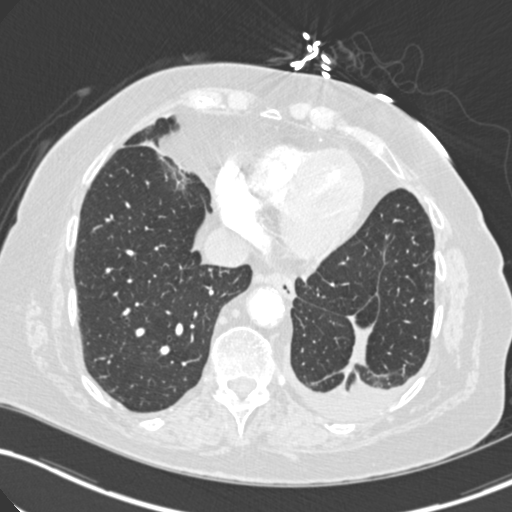
[im 114/304  mediastinal]
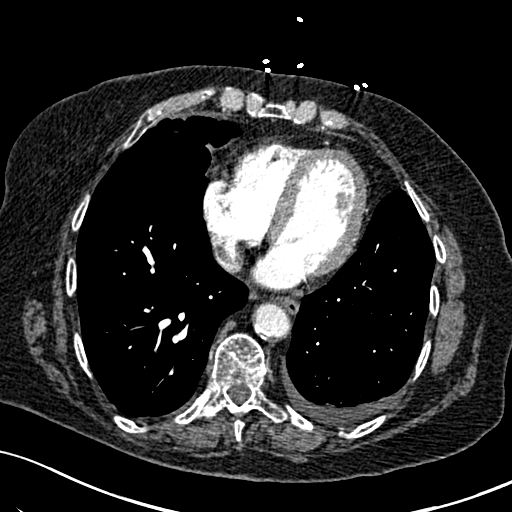
[im 133/304  lung]
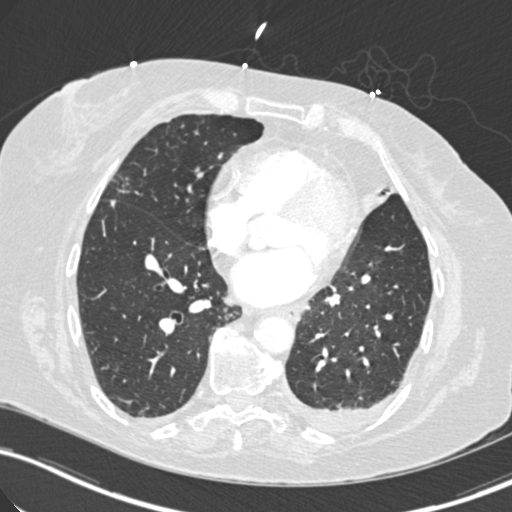
[im 152/304  mediastinal]
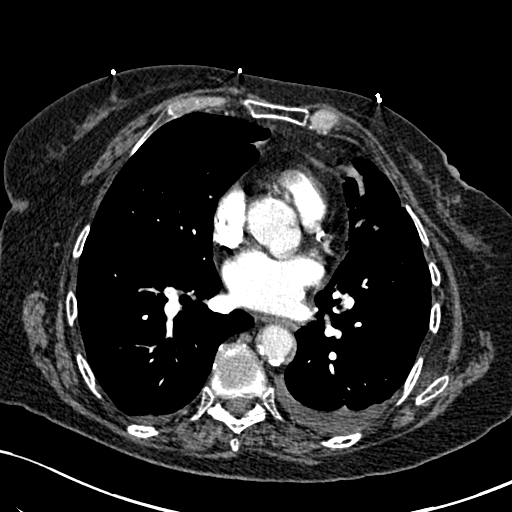
[im 171/304  lung]
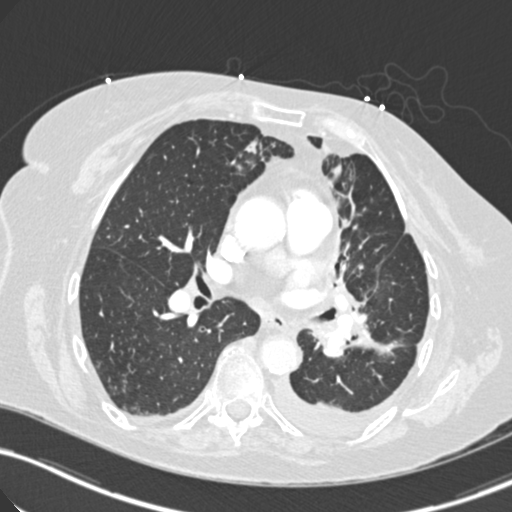
[im 190/304  mediastinal]
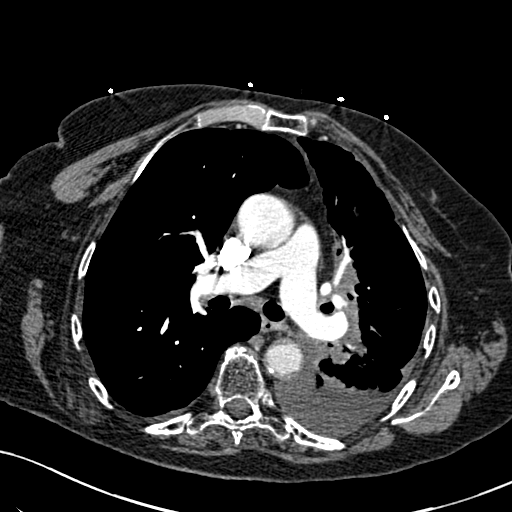
[im 209/304  lung]
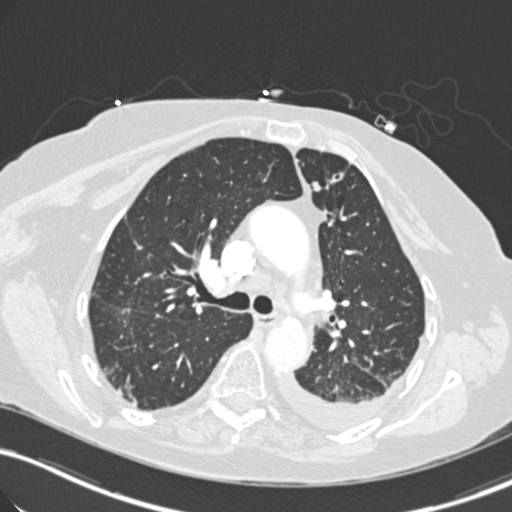
[im 228/304  mediastinal]
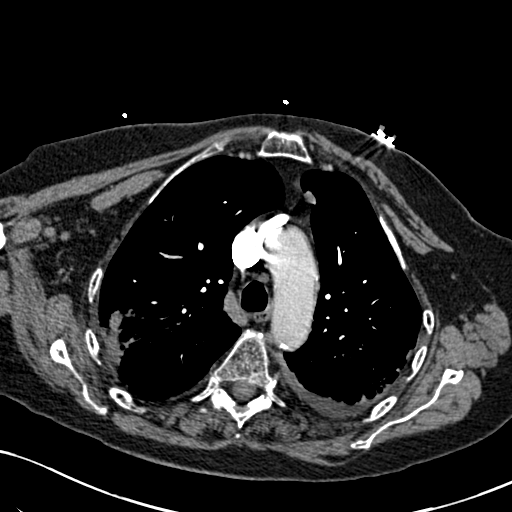
[im 247/304  lung]
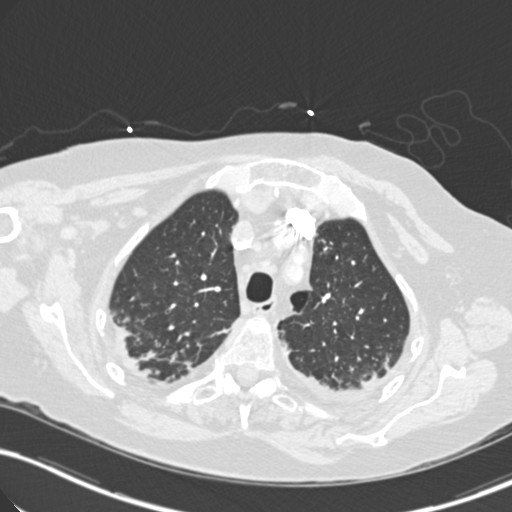
[im 266/304  mediastinal]
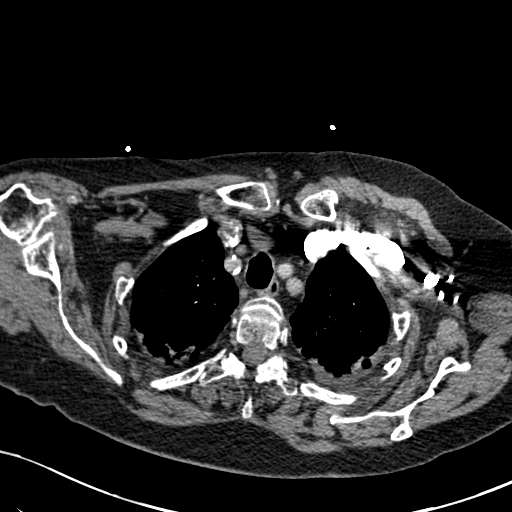
[im 285/304  lung]
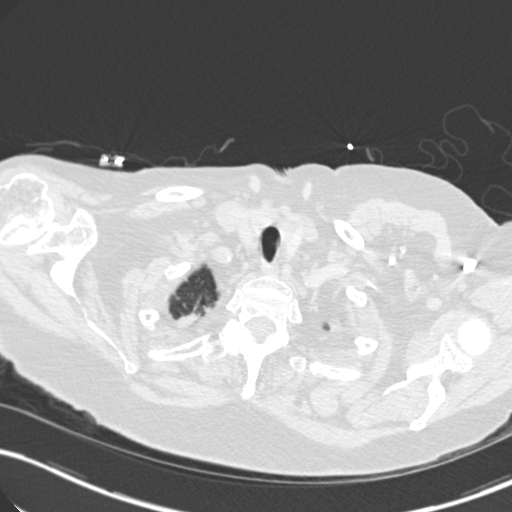

[16 of 36 positions shown; findings below may reference images not displayed]

FINDINGS: Technical quality:  Good.

Pulmonary emboli:  Absent.

Cardiovascular: Heart size normal with evidence of left ventricular
hypertrophy. Severe 3 vessel coronary atherosclerosis with right
coronary artery stent. No significant pericardial effusion. Severe
atherosclerosis involving the thoracic and upper abdominal aorta
with calcified and noncalcified plaque. No evidence of aneurysm or
dissection. Atherosclerosis at the origin of the of the great
vessels without stenosis.

Mediastinum/Lymph Nodes: Mildly enlarged precarinal (station 4R)
lymph node measuring approximately 1.5 x 2.2 cm, unchanged since the
most recent PET-CT. Enlarged right hilar lymph node measuring
approximately 2.3 x 1.7 cm, unchanged. Numerous normal and upper
normal sized mediastinal lymph nodes, unchanged. These lymph nodes
were hypermetabolic on the most recent PET-CT. No new or enlarging
lymphadenopathy. Small hiatal hernia. Normal esophagus. Atrophic
thyroid gland.

Lungs/Pleura: Postradiation scar and bronchiectasis centrally in the
left upper lobe and left lower lobe. Small to moderate-sized left
pleural effusion, unchanged. Multiple nodules anteriorly in the left
upper lobe, the largest a pleural based medial nodule measuring
cm maximally, unchanged, hypermetabolic on the prior PET-CT. Focal
consolidation in the posterior inferior right lower lobe, unchanged,
hypermetabolic on the prior PET-CT. Scarring anteriorly in the
inferior right upper lobe, unchanged. Approximate 6 mm nodule
posteriorly and inferiorly in the right lower lobe, a new finding.
No new nodules or masses elsewhere. Biapical pleuroparenchymal
scarring. Central airways patent.

Upper abdomen: Simple cysts arising from the upper poles of both
kidneys. Approximate 1 cm nodule arising from the lateral limb of
the right adrenal gland, unchanged, not hypermetabolic on the prior
PET-CT. No acute abnormality involving the visualized upper abdomen.

Musculoskeletal: Osseous demineralization. Exaggeration of the usual
thoracic kyphosis. Mild-to-moderate degenerative changes throughout
the thoracic spine. No evidence of osseous metastatic disease.

Review of the MIP images confirms the above findings.
IMPRESSION: 1. No evidence pulmonary embolism.
2. Chronic changes throughout both lungs as detailed above,
including multiple nodules anteriorly in the left upper lobe,
consolidation in the posterior inferior right lower lobe, and
scattered areas of parenchymal scarring throughout both lungs.
3. New 6 mm nodule in the posterior inferior right lower lobe. No
new nodules elsewhere.
4. Stable mediastinal and right hilar lymphadenopathy.
5. Normal heart size with left ventricular hypertrophy. Three vessel
coronary atherosclerosis. Severe thoracic and upper abdominal aortic
atherosclerosis with calcified and noncalcified plaque without
evidence of aneurysm.

## 2017-02-22 IMAGING — DX DG CHEST 1V PORT
2 series · 2 of 2 positions shown · non-contrast
Comparison: 08/14/2015

CLINICAL DATA: Recently released from hospital for hip replacement,
pneumonia, and today has worsening symptoms of cough, congestion,
sob, and nausea

EXAM:
PORTABLE CHEST 1 VIEW

[chest ap (1 of 2)]
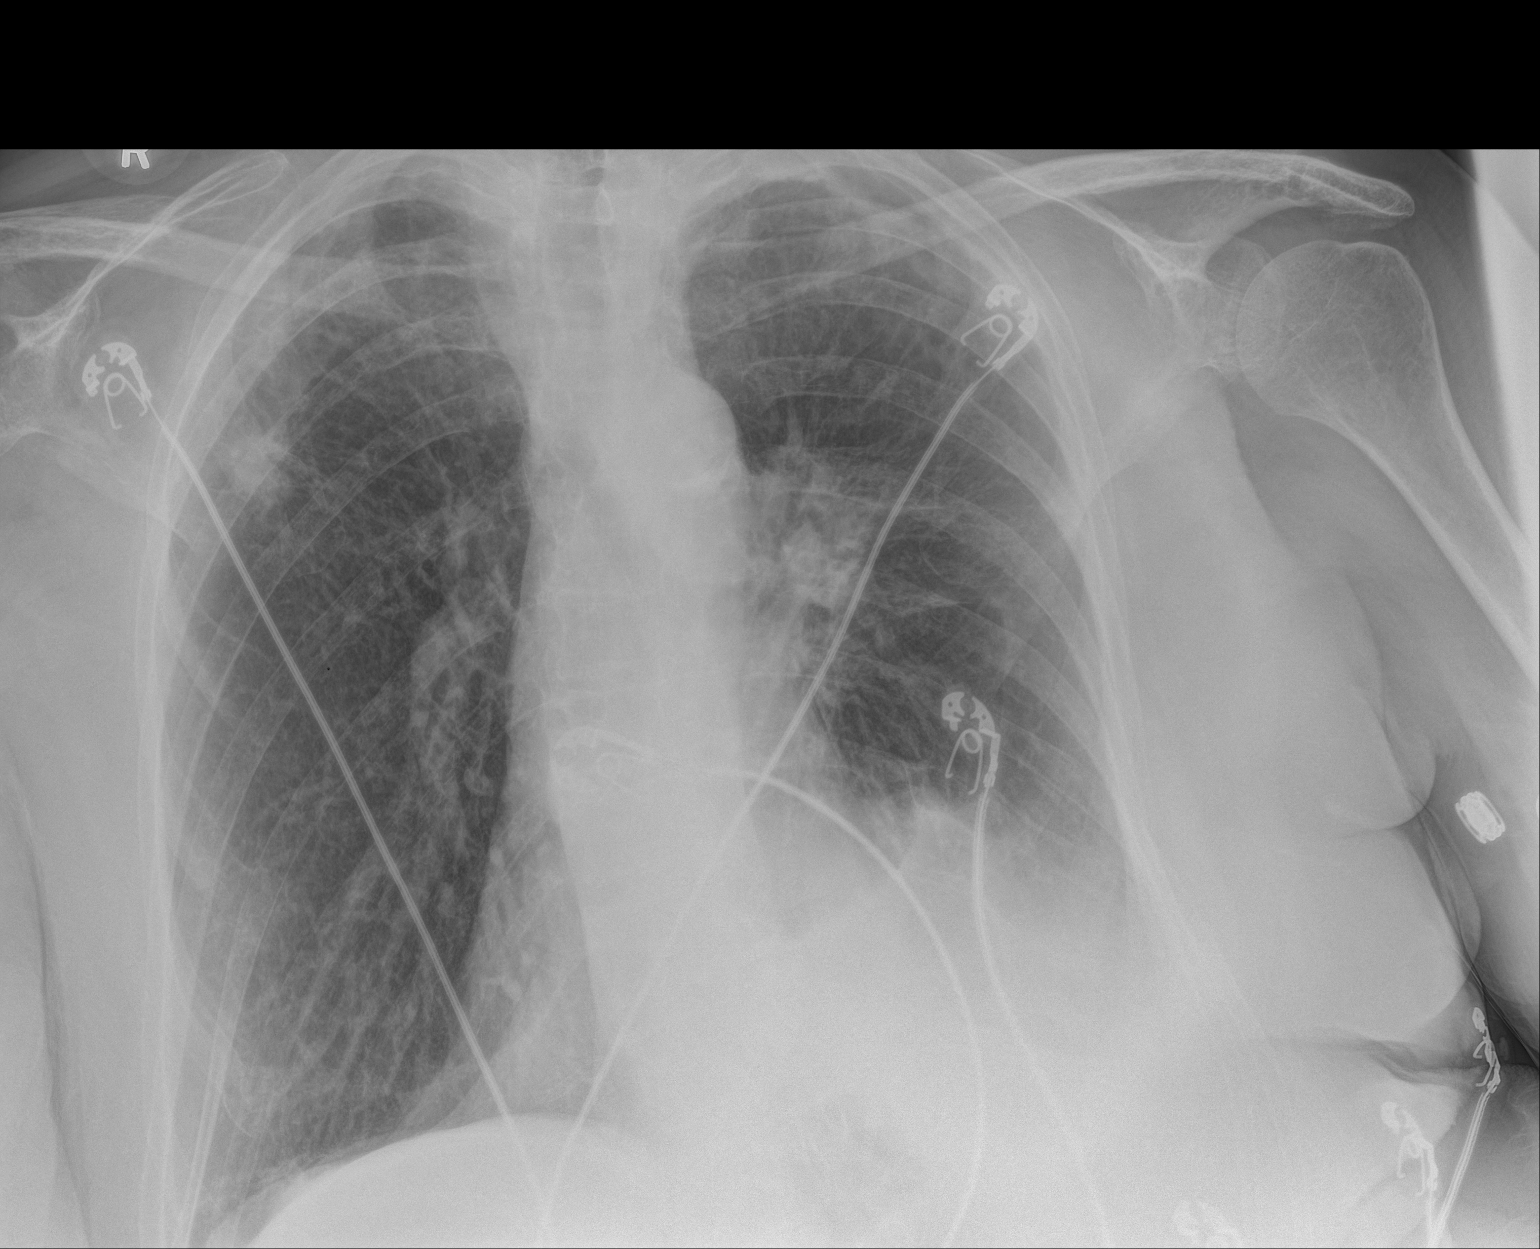

[chest ap (2 of 2)]
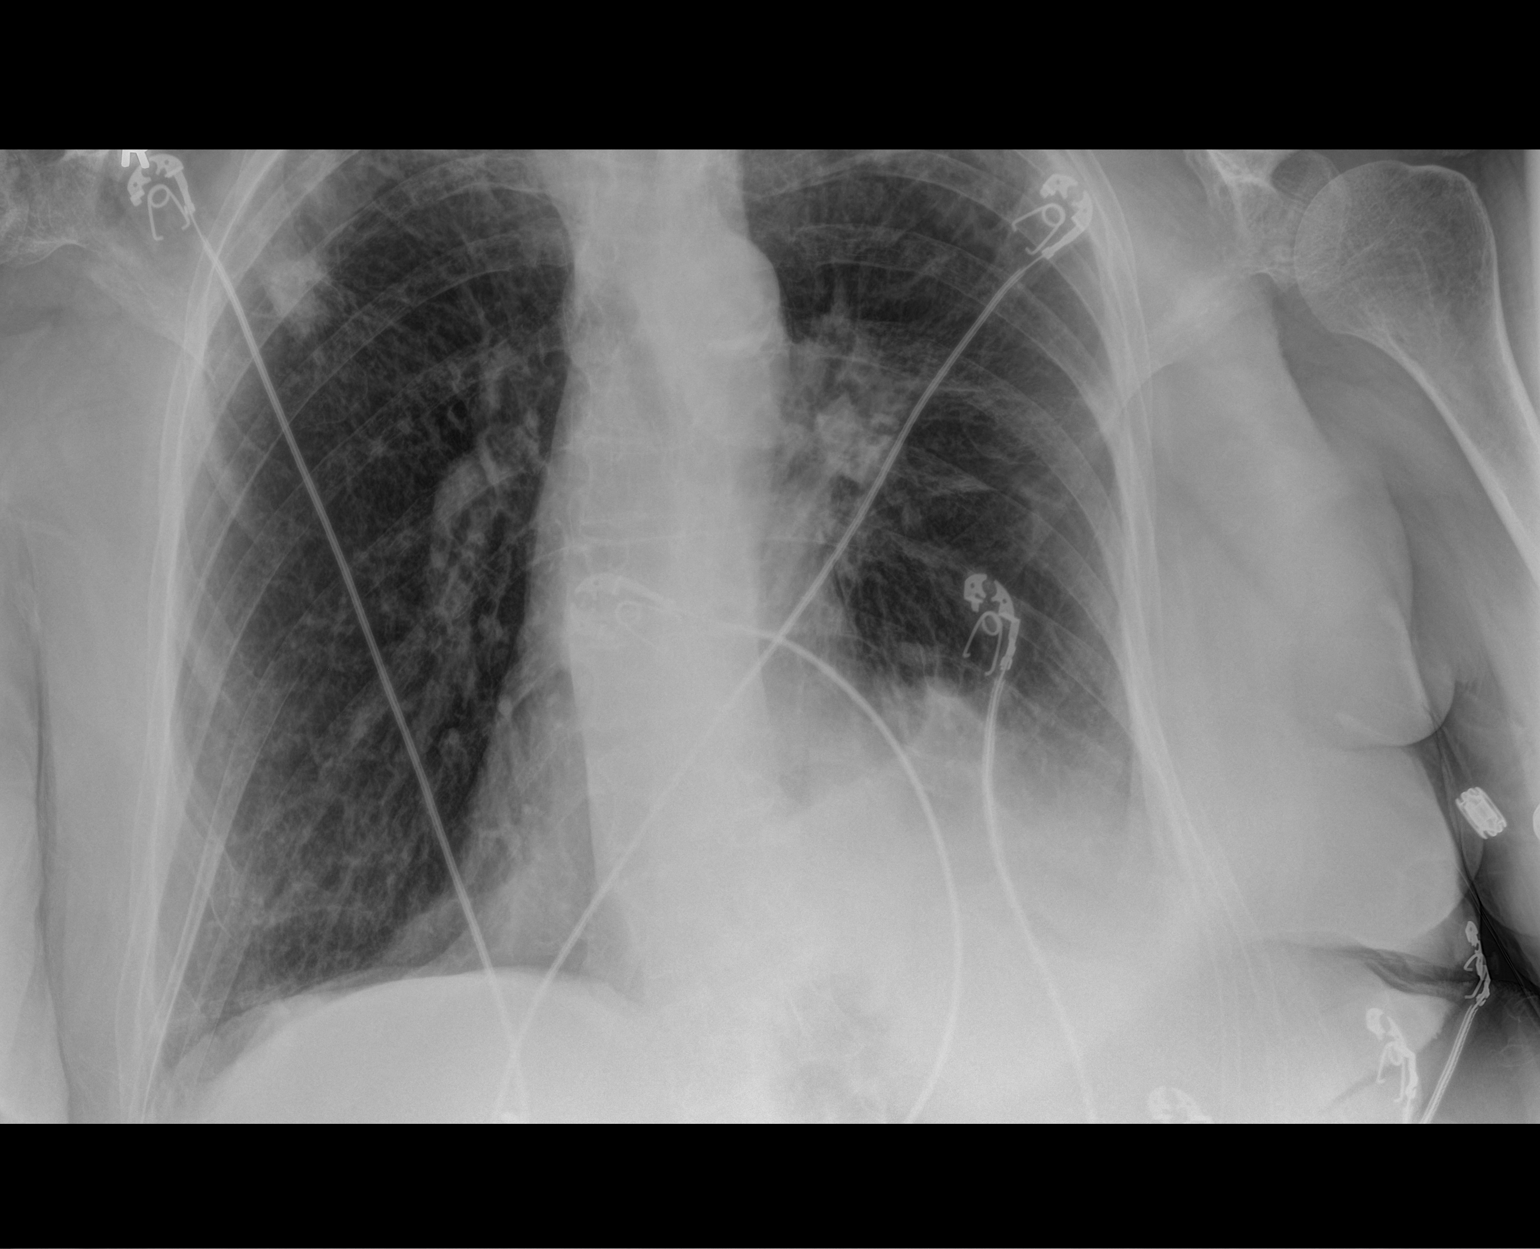

[2 of 2 positions shown; findings below may reference images not displayed]

FINDINGS: Since the prior study common new opacity has developed at the left
lung appears obscuring the left heart border and left hemidiaphragm.

Masslike fullness along the left hilum, and irregular mass in the
right upper lobe are stable.

Cardiac silhouette is normal in size.

Bony thorax is demineralized but grossly intact.
IMPRESSION: 1. New opacity at the left lung base. This is consistent with
new/recurrent pneumonia likely with associated pleural fluid.
